# Patient Record
Sex: Female | Born: 1935
Health system: Southern US, Community
[De-identification: ages and names within clinical notes are randomized; demographics above are authoritative.]

## PROBLEM LIST (undated history)

## (undated) DIAGNOSIS — K8020 Calculus of gallbladder without cholecystitis without obstruction: Secondary | ICD-10-CM

## (undated) DIAGNOSIS — E785 Hyperlipidemia, unspecified: Secondary | ICD-10-CM

## (undated) DIAGNOSIS — R519 Headache, unspecified: Secondary | ICD-10-CM

## (undated) DIAGNOSIS — H3530 Unspecified macular degeneration: Secondary | ICD-10-CM

## (undated) DIAGNOSIS — F419 Anxiety disorder, unspecified: Secondary | ICD-10-CM

## (undated) DIAGNOSIS — C801 Malignant (primary) neoplasm, unspecified: Secondary | ICD-10-CM

## (undated) DIAGNOSIS — R079 Chest pain, unspecified: Secondary | ICD-10-CM

## (undated) DIAGNOSIS — R0600 Dyspnea, unspecified: Secondary | ICD-10-CM

## (undated) DIAGNOSIS — E119 Type 2 diabetes mellitus without complications: Secondary | ICD-10-CM

## (undated) DIAGNOSIS — R51 Headache: Secondary | ICD-10-CM

## (undated) DIAGNOSIS — Z8489 Family history of other specified conditions: Secondary | ICD-10-CM

## (undated) DIAGNOSIS — I272 Pulmonary hypertension, unspecified: Secondary | ICD-10-CM

## (undated) DIAGNOSIS — M1990 Unspecified osteoarthritis, unspecified site: Secondary | ICD-10-CM

## (undated) DIAGNOSIS — Z950 Presence of cardiac pacemaker: Secondary | ICD-10-CM

## (undated) DIAGNOSIS — I10 Essential (primary) hypertension: Secondary | ICD-10-CM

## (undated) DIAGNOSIS — Z972 Presence of dental prosthetic device (complete) (partial): Secondary | ICD-10-CM

## (undated) HISTORY — PX: PACEMAKER INSERTION: SHX728

## (undated) HISTORY — PX: KNEE SURGERY: SHX244

## (undated) HISTORY — DX: Hyperlipidemia, unspecified: E78.5

## (undated) HISTORY — PX: TOTAL KNEE ARTHROPLASTY: SHX125

## (undated) HISTORY — PX: CATARACT EXTRACTION: SUR2

## (undated) HISTORY — PX: MULTIPLE TOOTH EXTRACTIONS: SHX2053

## (undated) HISTORY — DX: Chest pain, unspecified: R07.9

## (undated) HISTORY — DX: Anxiety disorder, unspecified: F41.9

## (undated) HISTORY — DX: Type 2 diabetes mellitus without complications: E11.9

## (undated) HISTORY — DX: Unspecified osteoarthritis, unspecified site: M19.90

## (undated) HISTORY — DX: Presence of cardiac pacemaker: Z95.0

---

## 2008-09-13 HISTORY — PX: BREAST EXCISIONAL BIOPSY: SUR124

## 2011-09-22 DIAGNOSIS — R7309 Other abnormal glucose: Secondary | ICD-10-CM | POA: Diagnosis not present

## 2011-09-22 DIAGNOSIS — I10 Essential (primary) hypertension: Secondary | ICD-10-CM | POA: Diagnosis not present

## 2011-09-22 DIAGNOSIS — E785 Hyperlipidemia, unspecified: Secondary | ICD-10-CM | POA: Diagnosis not present

## 2011-10-14 DIAGNOSIS — C50919 Malignant neoplasm of unspecified site of unspecified female breast: Secondary | ICD-10-CM | POA: Diagnosis not present

## 2011-10-14 DIAGNOSIS — Z8041 Family history of malignant neoplasm of ovary: Secondary | ICD-10-CM | POA: Diagnosis not present

## 2011-10-14 DIAGNOSIS — E7800 Pure hypercholesterolemia, unspecified: Secondary | ICD-10-CM | POA: Diagnosis not present

## 2011-10-14 DIAGNOSIS — Z950 Presence of cardiac pacemaker: Secondary | ICD-10-CM | POA: Diagnosis not present

## 2011-10-28 DIAGNOSIS — C50919 Malignant neoplasm of unspecified site of unspecified female breast: Secondary | ICD-10-CM | POA: Diagnosis not present

## 2011-12-02 DIAGNOSIS — Z950 Presence of cardiac pacemaker: Secondary | ICD-10-CM | POA: Diagnosis not present

## 2011-12-02 DIAGNOSIS — I442 Atrioventricular block, complete: Secondary | ICD-10-CM | POA: Diagnosis not present

## 2011-12-16 DIAGNOSIS — R1032 Left lower quadrant pain: Secondary | ICD-10-CM | POA: Diagnosis not present

## 2011-12-16 DIAGNOSIS — K5900 Constipation, unspecified: Secondary | ICD-10-CM | POA: Diagnosis not present

## 2011-12-23 DIAGNOSIS — R1032 Left lower quadrant pain: Secondary | ICD-10-CM | POA: Diagnosis not present

## 2011-12-23 DIAGNOSIS — K5730 Diverticulosis of large intestine without perforation or abscess without bleeding: Secondary | ICD-10-CM | POA: Diagnosis not present

## 2011-12-23 DIAGNOSIS — R109 Unspecified abdominal pain: Secondary | ICD-10-CM | POA: Diagnosis not present

## 2011-12-27 DIAGNOSIS — R7309 Other abnormal glucose: Secondary | ICD-10-CM | POA: Diagnosis not present

## 2011-12-27 DIAGNOSIS — Z23 Encounter for immunization: Secondary | ICD-10-CM | POA: Diagnosis not present

## 2011-12-27 DIAGNOSIS — I10 Essential (primary) hypertension: Secondary | ICD-10-CM | POA: Diagnosis not present

## 2011-12-28 DIAGNOSIS — R928 Other abnormal and inconclusive findings on diagnostic imaging of breast: Secondary | ICD-10-CM | POA: Diagnosis not present

## 2011-12-28 DIAGNOSIS — Z853 Personal history of malignant neoplasm of breast: Secondary | ICD-10-CM | POA: Diagnosis not present

## 2012-01-03 DIAGNOSIS — R1032 Left lower quadrant pain: Secondary | ICD-10-CM | POA: Diagnosis not present

## 2012-02-29 DIAGNOSIS — IMO0002 Reserved for concepts with insufficient information to code with codable children: Secondary | ICD-10-CM | POA: Diagnosis not present

## 2012-03-14 DIAGNOSIS — I442 Atrioventricular block, complete: Secondary | ICD-10-CM | POA: Diagnosis not present

## 2012-03-14 DIAGNOSIS — Z950 Presence of cardiac pacemaker: Secondary | ICD-10-CM | POA: Diagnosis not present

## 2012-04-11 DIAGNOSIS — R7309 Other abnormal glucose: Secondary | ICD-10-CM | POA: Diagnosis not present

## 2012-04-11 DIAGNOSIS — I10 Essential (primary) hypertension: Secondary | ICD-10-CM | POA: Diagnosis not present

## 2012-04-11 DIAGNOSIS — L989 Disorder of the skin and subcutaneous tissue, unspecified: Secondary | ICD-10-CM | POA: Diagnosis not present

## 2012-05-02 DIAGNOSIS — Z09 Encounter for follow-up examination after completed treatment for conditions other than malignant neoplasm: Secondary | ICD-10-CM | POA: Diagnosis not present

## 2012-05-02 DIAGNOSIS — E7800 Pure hypercholesterolemia, unspecified: Secondary | ICD-10-CM | POA: Diagnosis not present

## 2012-05-02 DIAGNOSIS — Z9889 Other specified postprocedural states: Secondary | ICD-10-CM | POA: Diagnosis not present

## 2012-05-02 DIAGNOSIS — Z7982 Long term (current) use of aspirin: Secondary | ICD-10-CM | POA: Diagnosis not present

## 2012-05-02 DIAGNOSIS — Z950 Presence of cardiac pacemaker: Secondary | ICD-10-CM | POA: Diagnosis not present

## 2012-05-02 DIAGNOSIS — M549 Dorsalgia, unspecified: Secondary | ICD-10-CM | POA: Diagnosis not present

## 2012-05-02 DIAGNOSIS — Z853 Personal history of malignant neoplasm of breast: Secondary | ICD-10-CM | POA: Diagnosis not present

## 2012-05-02 DIAGNOSIS — G8929 Other chronic pain: Secondary | ICD-10-CM | POA: Diagnosis not present

## 2012-05-02 DIAGNOSIS — Z808 Family history of malignant neoplasm of other organs or systems: Secondary | ICD-10-CM | POA: Diagnosis not present

## 2012-05-23 DIAGNOSIS — R7309 Other abnormal glucose: Secondary | ICD-10-CM | POA: Diagnosis not present

## 2012-05-30 DIAGNOSIS — E119 Type 2 diabetes mellitus without complications: Secondary | ICD-10-CM | POA: Diagnosis not present

## 2012-06-29 DIAGNOSIS — Z9889 Other specified postprocedural states: Secondary | ICD-10-CM | POA: Diagnosis not present

## 2012-06-29 DIAGNOSIS — Z853 Personal history of malignant neoplasm of breast: Secondary | ICD-10-CM | POA: Diagnosis not present

## 2012-07-13 DIAGNOSIS — R7309 Other abnormal glucose: Secondary | ICD-10-CM | POA: Diagnosis not present

## 2012-07-13 DIAGNOSIS — Z23 Encounter for immunization: Secondary | ICD-10-CM | POA: Diagnosis not present

## 2012-07-13 DIAGNOSIS — I10 Essential (primary) hypertension: Secondary | ICD-10-CM | POA: Diagnosis not present

## 2012-07-13 DIAGNOSIS — E785 Hyperlipidemia, unspecified: Secondary | ICD-10-CM | POA: Diagnosis not present

## 2012-07-25 DIAGNOSIS — R7309 Other abnormal glucose: Secondary | ICD-10-CM | POA: Diagnosis not present

## 2012-08-03 DIAGNOSIS — I059 Rheumatic mitral valve disease, unspecified: Secondary | ICD-10-CM | POA: Diagnosis not present

## 2012-08-03 DIAGNOSIS — Z950 Presence of cardiac pacemaker: Secondary | ICD-10-CM | POA: Diagnosis not present

## 2012-08-03 DIAGNOSIS — I442 Atrioventricular block, complete: Secondary | ICD-10-CM | POA: Diagnosis not present

## 2012-08-03 DIAGNOSIS — I10 Essential (primary) hypertension: Secondary | ICD-10-CM | POA: Diagnosis not present

## 2012-10-19 DIAGNOSIS — E119 Type 2 diabetes mellitus without complications: Secondary | ICD-10-CM | POA: Diagnosis not present

## 2012-10-19 DIAGNOSIS — E785 Hyperlipidemia, unspecified: Secondary | ICD-10-CM | POA: Diagnosis not present

## 2012-10-19 DIAGNOSIS — M25559 Pain in unspecified hip: Secondary | ICD-10-CM | POA: Diagnosis not present

## 2012-10-19 DIAGNOSIS — I10 Essential (primary) hypertension: Secondary | ICD-10-CM | POA: Diagnosis not present

## 2012-10-24 DIAGNOSIS — M5137 Other intervertebral disc degeneration, lumbosacral region: Secondary | ICD-10-CM | POA: Diagnosis not present

## 2012-10-24 DIAGNOSIS — M48061 Spinal stenosis, lumbar region without neurogenic claudication: Secondary | ICD-10-CM | POA: Diagnosis not present

## 2012-11-07 DIAGNOSIS — Z950 Presence of cardiac pacemaker: Secondary | ICD-10-CM | POA: Diagnosis not present

## 2012-11-07 DIAGNOSIS — I442 Atrioventricular block, complete: Secondary | ICD-10-CM | POA: Diagnosis not present

## 2013-01-04 DIAGNOSIS — Z853 Personal history of malignant neoplasm of breast: Secondary | ICD-10-CM | POA: Diagnosis not present

## 2013-01-16 DIAGNOSIS — I10 Essential (primary) hypertension: Secondary | ICD-10-CM | POA: Diagnosis not present

## 2013-01-16 DIAGNOSIS — E785 Hyperlipidemia, unspecified: Secondary | ICD-10-CM | POA: Diagnosis not present

## 2013-01-16 DIAGNOSIS — E119 Type 2 diabetes mellitus without complications: Secondary | ICD-10-CM | POA: Diagnosis not present

## 2013-01-30 DIAGNOSIS — I059 Rheumatic mitral valve disease, unspecified: Secondary | ICD-10-CM | POA: Diagnosis not present

## 2013-01-30 DIAGNOSIS — E785 Hyperlipidemia, unspecified: Secondary | ICD-10-CM | POA: Diagnosis not present

## 2013-01-30 DIAGNOSIS — I10 Essential (primary) hypertension: Secondary | ICD-10-CM | POA: Diagnosis not present

## 2013-01-30 DIAGNOSIS — I079 Rheumatic tricuspid valve disease, unspecified: Secondary | ICD-10-CM | POA: Diagnosis not present

## 2013-01-30 DIAGNOSIS — I442 Atrioventricular block, complete: Secondary | ICD-10-CM | POA: Diagnosis not present

## 2013-05-19 ENCOUNTER — Emergency Department (HOSPITAL_COMMUNITY): Payer: Medicare Other

## 2013-05-19 ENCOUNTER — Encounter (HOSPITAL_COMMUNITY): Payer: Self-pay | Admitting: Emergency Medicine

## 2013-05-19 ENCOUNTER — Emergency Department (INDEPENDENT_AMBULATORY_CARE_PROVIDER_SITE_OTHER)
Admission: EM | Admit: 2013-05-19 | Discharge: 2013-05-19 | Disposition: A | Discharge status: Nursing Home (Includes ICF) | Payer: Medicare Other | Source: Home / Self Care | Attending: Family Medicine | Admitting: Family Medicine

## 2013-05-19 ENCOUNTER — Observation Stay (HOSPITAL_COMMUNITY)
Admission: EM | Admit: 2013-05-19 | Discharge: 2013-05-21 | Disposition: A | Discharge status: Home / Self Care | Payer: Medicare Other | Attending: Internal Medicine | Admitting: Internal Medicine

## 2013-05-19 DIAGNOSIS — R7309 Other abnormal glucose: Secondary | ICD-10-CM | POA: Diagnosis not present

## 2013-05-19 DIAGNOSIS — Z950 Presence of cardiac pacemaker: Secondary | ICD-10-CM | POA: Diagnosis not present

## 2013-05-19 DIAGNOSIS — R0609 Other forms of dyspnea: Secondary | ICD-10-CM | POA: Insufficient documentation

## 2013-05-19 DIAGNOSIS — R079 Chest pain, unspecified: Secondary | ICD-10-CM

## 2013-05-19 DIAGNOSIS — E1142 Type 2 diabetes mellitus with diabetic polyneuropathy: Secondary | ICD-10-CM | POA: Diagnosis present

## 2013-05-19 DIAGNOSIS — R0602 Shortness of breath: Secondary | ICD-10-CM | POA: Insufficient documentation

## 2013-05-19 DIAGNOSIS — R739 Hyperglycemia, unspecified: Secondary | ICD-10-CM

## 2013-05-19 DIAGNOSIS — R072 Precordial pain: Secondary | ICD-10-CM | POA: Diagnosis not present

## 2013-05-19 DIAGNOSIS — R42 Dizziness and giddiness: Secondary | ICD-10-CM | POA: Diagnosis not present

## 2013-05-19 DIAGNOSIS — I10 Essential (primary) hypertension: Secondary | ICD-10-CM | POA: Diagnosis not present

## 2013-05-19 DIAGNOSIS — E119 Type 2 diabetes mellitus without complications: Secondary | ICD-10-CM

## 2013-05-19 DIAGNOSIS — R0989 Other specified symptoms and signs involving the circulatory and respiratory systems: Secondary | ICD-10-CM | POA: Insufficient documentation

## 2013-05-19 HISTORY — DX: Essential (primary) hypertension: I10

## 2013-05-19 LAB — BASIC METABOLIC PANEL
BUN: 17 mg/dL (ref 6–23)
Chloride: 100 mEq/L (ref 96–112)
Creatinine, Ser: 0.61 mg/dL (ref 0.50–1.10)
Glucose, Bld: 161 mg/dL — ABNORMAL HIGH (ref 70–99)
Potassium: 3.7 mEq/L (ref 3.5–5.1)

## 2013-05-19 LAB — PROTIME-INR: INR: 1.05 (ref 0.00–1.49)

## 2013-05-19 LAB — CBC
HCT: 38.9 % (ref 36.0–46.0)
Hemoglobin: 13.2 g/dL (ref 12.0–15.0)
MCV: 84.2 fL (ref 78.0–100.0)
WBC: 5.7 10*3/uL (ref 4.0–10.5)

## 2013-05-19 LAB — D-DIMER, QUANTITATIVE: D-Dimer, Quant: 0.42 ug/mL-FEU (ref 0.00–0.48)

## 2013-05-19 LAB — POCT I-STAT TROPONIN I
Troponin i, poc: 0.02 ng/mL (ref 0.00–0.08)
Troponin i, poc: 0.02 ng/mL (ref 0.00–0.08)

## 2013-05-19 MED ORDER — ASPIRIN 81 MG PO CHEW: 324.0000 mg | CHEWABLE_TABLET | Freq: Once | ORAL | Status: DC

## 2013-05-19 MED ORDER — SODIUM CHLORIDE 0.9 % IV SOLN
Freq: Once | INTRAVENOUS | Status: AC
  Administered 2013-05-19: 16:00:00 via INTRAVENOUS

## 2013-05-19 NOTE — ED Provider Notes (Signed)
Maria [name] is a 79 y.o. female who presents to Urgent Care today for central chest pain shortness of breath dizziness left arm pain and left arm weakness. The pain is nonexertional . The chest pain and shortness of breath at the present for around 2 days and worsened over the last 24 hours. Left arm pain is been present for about one week. She patient thinks perhaps she is having a heart attack or stroke. She did take aspirin this morning prior to presentation to the urgent care. She notes that she has a an implantable pacer this previously been managed by cardiology in [place]. She recently moved to [place] to live with her daughter. She does not have a cardiologist here in [place]. She denies any nausea vomiting or diarrhea.    History reviewed. No pertinent past medical history. History  Substance Use Topics  . Smoking status: Never Smoker   . Smokeless tobacco: Not on file  . Alcohol Use: Not on file   ROS as above Medications reviewed. No current facility-administered medications for this encounter.   Current Outpatient Prescriptions  Medication Sig Dispense Refill  . moexipril-hydrochlorothiazide (UNIRETIC) 15-25 MG per tablet Take 1 tablet by mouth daily.        Exam:  BP 134/71  Pulse 87  Temp(Src) 98.1 F (36.7 C) (Oral)  Resp 16  SpO2 100% Gen: Well NAD HEENT: EOMI,  MMM Lungs: CTABL Nl WOB Heart: RRR no MRG Abd: NABS, NT, ND Exts: Non edematous BL  LE, warm and well perfused.   No results found for this or any previous visit (from the past 24 hour(s)). No results found.  Twelve-lead EKG shows atrial sensed and ventricular paced rhythm. Wide QRS complex due to ventricular pacing. No prior EKG to compare to.   Assessment and Plan: 79 y.o. female with nonexertional central chest pain in the setting of a pacemaker.  Patient additionally has dizziness and left arm pain. I'm concerned she may be having a myocardial infarction. Plan to transfer to the  emergency room via EMS for further evaluation and management. Patient and daughter expressed understanding and agreed.    Rodolph Bong, MD 05/19/13 1539

## 2013-05-19 NOTE — ED Notes (Signed)
Pt c/o sob, dizziness, headache x 2 days. Pt states she has left arm pain x 1 week. Pt took aspirin 6 hrs ago. Jan Ranson, SMA

## 2013-05-19 NOTE — ED Notes (Signed)
Patient transferred for [facility] via [facility] c/o of substernal chest pain and pain under left breast X2days.  Patient does have a pacemaker.  Patient also c/o SOB and denies n/v and diaphoresis. Patient took 324 of aspirin PTA and did not receive any nitroglycerin.  Patient is alert and orientedx4  NAD at this time.

## 2013-05-19 NOTE — ED Notes (Signed)
Patient states she hasn't eaten since this morning and is requesting something to eat.

## 2013-05-19 NOTE — ED Notes (Signed)
[facility] did not have truck avail... Called [facility] and report given to them

## 2013-05-19 NOTE — ED Provider Notes (Signed)
CSN: [id number]     Arrival date & time 05/19/13  1634 History   First MD Initiated Contact with Patient 05/19/13 1704     Chief Complaint  Patient presents with  . Chest Pain   (Consider location/radiation/quality/duration/timing/severity/associated sxs/prior Treatment) Patient is a 79 y.o. female presenting with chest pain. The history is provided by the patient.  Chest Pain She has been having dizziness and chest pain for the last 2 days, but actually is feeling better today. Dizziness is described as a lightheaded feeling which is worse when she bends over. Chest pain is sharp and in the mid sternal area without radiation. It seems to be worse when she is laying down but nothing makes it better. Pain was 7/10 at its worst but she states she is pain-free and states she's been pain-free since this morning. There has been some mild dyspnea but no nausea or diaphoresis. She denies any cough. Denies fever or chills. She went to urgent care where she was referred to the emergency department. She is cared for by a cardiologist in [place] because of a pacemaker which he denies having any known coronary artery disease.  Past Medical History  Diagnosis Date  . Hypertension    History reviewed. No pertinent past surgical history. No family history on file. History  Substance Use Topics  . Smoking status: Never Smoker   . Smokeless tobacco: Not on file  . Alcohol Use: Not on file   OB History   Grav Para Term Preterm Abortions TAB SAB Ect Mult Living                 Review of Systems  Cardiovascular: Positive for chest pain.  All other systems reviewed and are negative.    Allergies  Review of patient's allergies indicates no known allergies.  Home Medications   Current Outpatient Rx  Name  Route  Sig  Dispense  Refill  . moexipril-hydrochlorothiazide (UNIRETIC) 15-25 MG per tablet   Oral   Take 1 tablet by mouth daily.          BP 126/56  Pulse 61  Temp(Src) 97.9 F  (36.6 C) (Oral)  Resp 20  SpO2 100% Physical Exam  Nursing note and vitals reviewed.  79 year old female, resting comfortably and in no acute distress. Vital signs are normal. Oxygen saturation is 100%, which is normal. Head is normocephalic and atraumatic. PERRLA, EOMI. Oropharynx is clear. Neck is nontender and supple without adenopathy or JVD. Back is nontender and there is no CVA tenderness. Lungs are clear without rales, wheezes, or rhonchi. Chest is nontender. Heart has regular rate and rhythm without murmur. Abdomen is soft, flat, nontender without masses or hepatosplenomegaly and peristalsis is normoactive. Extremities have no cyanosis or edema, full range of motion is present. Skin is warm and dry without rash. Neurologic: Mental status is normal, cranial nerves are intact, there are no motor or sensory deficits.  ED Course  Procedures (including critical care time) Labs Review Results for orders placed during the hospital encounter of 05/19/13  CBC      Result Value Range   WBC 5.7  4.0 - 10.5 K/uL   RBC 4.62  3.87 - 5.11 MIL/uL   Hemoglobin 13.2  12.0 - 15.0 g/dL   HCT 86.5  78.4 - 69.6 %   MCV 84.2  78.0 - 100.0 fL   MCH 28.6  26.0 - 34.0 pg   MCHC 33.9  30.0 - 36.0 g/dL   RDW 13.4  11.5 - 15.5 %   Platelets 228  150 - 400 K/uL  BASIC METABOLIC PANEL      Result Value Range   Sodium 138  135 - 145 mEq/L   Potassium 3.7  3.5 - 5.1 mEq/L   Chloride 100  96 - 112 mEq/L   CO2 26  19 - 32 mEq/L   Glucose, Bld 161 (*) 70 - 99 mg/dL   BUN 17  6 - 23 mg/dL   Creatinine, Ser 1.61  0.50 - 1.10 mg/dL   Calcium 9.5  8.4 - 09.6 mg/dL   GFR calc non Af Amer 80 (*) >90 mL/min   GFR calc Af Amer >90  >90 mL/min  PRO B NATRIURETIC PEPTIDE      Result Value Range   Pro B Natriuretic peptide (BNP) 284.9  0 - 450 pg/mL  PROTIME-INR      Result Value Range   Prothrombin Time 13.5  11.6 - 15.2 seconds   INR 1.05  0.00 - 1.49  D-DIMER, QUANTITATIVE      Result Value Range    D-Dimer, Quant 0.42  0.00 - 0.48 ug/mL-FEU  POCT I-STAT TROPONIN I      Result Value Range   Troponin i, poc 0.02  0.00 - 0.08 ng/mL   Comment 3            Imaging Review Dg Chest 2 View  05/19/2013   *RADIOLOGY REPORT*  Clinical Data: Chest pain and shortness of breath.  CHEST - 2 VIEW  Comparison: No priors.  Findings: Lung volumes are normal.  No consolidative airspace disease.  No pleural effusions.  No pneumothorax.  No pulmonary nodule or mass noted.  Pulmonary vasculature and the cardiomediastinal silhouette are within normal limits. Atherosclerosis in the thoracic aorta.  Left-sided pacemaker device in place with lead tips projecting over the expected location of the right atrium and right ventricular apex.  Radiopaque material projecting over the lower right cervical region is of uncertain etiology and significance.  IMPRESSION: 1. No radiographic evidence of acute cardiopulmonary disease. 2.  Atherosclerosis.   Original Report Authenticated By: Trudie Reed, M.D.    Date: 05/19/2013  Rate: 61  Rhythm: AV sequential pacemaker  QRS Axis: left  Intervals: normal  ST/T Wave abnormalities: normal  Conduction Disutrbances:none  Narrative Interpretation: AV sequential pacemaker. No prior ECG available for comparison.  Old EKG Reviewed: none available   MDM   1. Chest pain   2. Hyperglycemia    Chest pain and dizziness which does seem to be resolving. Patient states that she [place] she says she will need referral for cardiology and for general medical physician. Her initial troponin is negative. Since she states she has been pain-free since this morning, this should be adequate to rule out acute cardiac event.  Laboratory workup has come back normal. Patient's history is now changing once again and she stating she is periodically having pains that come that last for about a minute. Because of uncertainty of her history, I feel that she needs to be  admitted for serial cardiac markers and consideration for cardiology consultation. Case is discussed with Dr. Julian Reil of [facility] who agrees to admit the patient. Incidentally noted on patient's laboratory workup his blood sugar of 161. This will need to be followed as an outpatient to make sure she is not developing diabetes.  Dione Booze, MD 05/19/13 2337

## 2013-05-19 NOTE — ED Notes (Signed)
Evaluated pt at request of registration staff.  Pt c/o dizzy+HA x4 days, worse in the last 24 hrs. Vitals obtained and pt asked to remain in waiting area until summoned to exam room.

## 2013-05-20 ENCOUNTER — Encounter (HOSPITAL_COMMUNITY): Payer: Self-pay | Admitting: *Deleted

## 2013-05-20 DIAGNOSIS — R079 Chest pain, unspecified: Secondary | ICD-10-CM | POA: Diagnosis not present

## 2013-05-20 DIAGNOSIS — R7309 Other abnormal glucose: Secondary | ICD-10-CM

## 2013-05-20 DIAGNOSIS — I10 Essential (primary) hypertension: Secondary | ICD-10-CM | POA: Diagnosis not present

## 2013-05-20 LAB — LIPID PANEL
HDL: 38 mg/dL — ABNORMAL LOW (ref >39)
LDL Cholesterol: 69 mg/dL (ref 0–99)
Triglycerides: 156 mg/dL — ABNORMAL HIGH (ref <150)

## 2013-05-20 MED ORDER — TRANDOLAPRIL 2 MG PO TABS
2.0000 mg | ORAL_TABLET | Freq: Every day | ORAL | Status: DC
  Administered 2013-05-20 – 2013-05-21 (×2): 2 mg via ORAL
  Filled 2013-05-20 (×2): qty 1

## 2013-05-20 MED ORDER — TRAMADOL HCL 50 MG PO TABS: 50.0000 mg | ORAL_TABLET | Freq: Four times a day (QID) | ORAL | Status: DC | PRN

## 2013-05-20 MED ORDER — SIMVASTATIN 40 MG PO TABS
40.0000 mg | ORAL_TABLET | Freq: Every evening | ORAL | Status: DC
  Administered 2013-05-20: 40 mg via ORAL
  Filled 2013-05-20 (×2): qty 1

## 2013-05-20 MED ORDER — HYDROCHLOROTHIAZIDE 25 MG PO TABS
25.0000 mg | ORAL_TABLET | Freq: Every day | ORAL | Status: DC
  Administered 2013-05-20 – 2013-05-21 (×2): 25 mg via ORAL
  Filled 2013-05-20 (×2): qty 1

## 2013-05-20 MED ORDER — POTASSIUM CHLORIDE CRYS ER 20 MEQ PO TBCR
20.0000 meq | EXTENDED_RELEASE_TABLET | Freq: Every day | ORAL | Status: DC
  Administered 2013-05-20 – 2013-05-21 (×2): 20 meq via ORAL
  Filled 2013-05-20 (×2): qty 1

## 2013-05-20 MED ORDER — ONDANSETRON HCL 4 MG/2ML IJ SOLN: 4.0000 mg | Freq: Four times a day (QID) | INTRAMUSCULAR | Status: DC | PRN

## 2013-05-20 MED ORDER — MECLIZINE HCL 12.5 MG PO TABS
12.5000 mg | ORAL_TABLET | Freq: Three times a day (TID) | ORAL | Status: DC | PRN
Start: 2013-05-20 — End: 2013-05-21
  Filled 2013-05-20: qty 1

## 2013-05-20 MED ORDER — ACETAMINOPHEN 325 MG PO TABS: 650.0000 mg | ORAL_TABLET | ORAL | Status: DC | PRN

## 2013-05-20 MED ORDER — MOEXIPRIL-HYDROCHLOROTHIAZIDE 15-25 MG PO TABS: 1.0000 | ORAL_TABLET | Freq: Every day | ORAL | Status: DC

## 2013-05-20 MED ORDER — ASPIRIN EC 81 MG PO TBEC
81.0000 mg | DELAYED_RELEASE_TABLET | Freq: Every day | ORAL | Status: DC
  Administered 2013-05-20 – 2013-05-21 (×2): 81 mg via ORAL
  Filled 2013-05-20 (×2): qty 1

## 2013-05-20 MED ORDER — HEPARIN SODIUM (PORCINE) 5000 UNIT/ML IJ SOLN
5000.0000 [IU] | Freq: Three times a day (TID) | INTRAMUSCULAR | Status: DC
  Administered 2013-05-20 – 2013-05-21 (×3): 5000 [IU] via SUBCUTANEOUS
  Filled 2013-05-20 (×7): qty 1

## 2013-05-20 NOTE — Progress Notes (Signed)
*  PRELIMINARY RESULTS* Echocardiogram 2D Echocardiogram has been performed.  Jeryl Columbia 05/20/2013, 3:29 PM

## 2013-05-20 NOTE — H&P (Signed)
[facility] History and Physical  Maria [name]:[id number] DOB: Nov 08, 1923 DOA: 05/19/2013  Referring physician: ED PCP: No primary provider on file.    Chief Complaint: Chest pain  HPI: Maria [name] is a 79 y.o. female who presents with 2 day history of chest pain.  Chest pain has been improving today and she is actually pain free at this time and has been since this morning.  There has been some mild dyspnea but no nausea nor diaphoresis.  She denies any cough.  She went to [facility] where she was referred to the ED.  She has seen a cardiologist in [place] in the past as she has a pacemaker but has no known history of CAD.  Last stress test was several years ago.  In the ED troponin's were negative.  Review of Systems: 12 systems reviewed and otherwise negative.  Past Medical History  Diagnosis Date  . Hypertension    History reviewed. No pertinent past surgical history. Social History:  reports that she has never smoked. Her smokeless tobacco use includes Snuff. She reports that she drinks about 0.6 ounces of alcohol per week. Her drug history is not on file.   No Known Allergies  History reviewed. No pertinent family history.  Prior to Admission medications   Medication Sig Start Date End Date Taking? Authorizing Provider  aspirin EC 81 MG tablet Take 81 mg by mouth daily.   Yes Historical Provider, MD  meclizine (ANTIVERT) 25 MG tablet Take 12.5 mg by mouth 3 (three) times daily as needed for dizziness.   Yes Historical Provider, MD  moexipril-hydrochlorothiazide (UNIRETIC) 15-25 MG per tablet Take 1 tablet by mouth daily.   Yes Historical Provider, MD  OVER THE COUNTER MEDICATION Place 2 drops into both eyes daily as needed (for dry eyes).   Yes Historical Provider, MD  potassium chloride SA (K-DUR,KLOR-CON) 20 MEQ tablet Take 20 mEq by mouth daily.   Yes Historical Provider, MD  simvastatin (ZOCOR) 40 MG tablet Take 40 mg by mouth every evening.   Yes Historical  Provider, MD  traMADol (ULTRAM) 50 MG tablet Take 50-100 mg by mouth every 6 (six) hours as needed for pain.   Yes Historical Provider, MD   Physical Exam: Filed Vitals:   05/20/13 0059  BP: 129/64  Pulse: 66  Temp: 98.4 F (36.9 C)  Resp: 18    General:  NAD, resting comfortably in bed Eyes: PEERLA EOMI ENT: mucous membranes moist Neck: supple w/o JVD Cardiovascular: RRR w/o MRG Respiratory: CTA B Abdomen: soft, nt, nd, bs+ Skin: no rash nor lesion Musculoskeletal: MAE, full ROM all 4 extremities Psychiatric: normal tone and affect Neurologic: AAOx3, grossly non-focal  Labs on Admission:  Basic Metabolic Panel:  Recent Labs Lab 05/19/13 1658  NA 138  K 3.7  CL 100  CO2 26  GLUCOSE 161*  BUN 17  CREATININE 0.61  CALCIUM 9.5   Liver Function Tests: No results found for this basename: AST, ALT, ALKPHOS, BILITOT, PROT, ALBUMIN,  in the last 168 hours No results found for this basename: LIPASE, AMYLASE,  in the last 168 hours No results found for this basename: AMMONIA,  in the last 168 hours CBC:  Recent Labs Lab 05/19/13 1658  WBC 5.7  HGB 13.2  HCT 38.9  MCV 84.2  PLT 228   Cardiac Enzymes: No results found for this basename: CKTOTAL, CKMB, CKMBINDEX, TROPONINI,  in the last 168 hours  BNP (last 3 results)  Recent Labs  05/19/13  1655  PROBNP 284.9   CBG: No results found for this basename: GLUCAP,  in the last 168 hours  Radiological Exams on Admission: Dg Chest 2 View  05/19/2013   *RADIOLOGY REPORT*  Clinical Data: Chest pain and shortness of breath.  CHEST - 2 VIEW  Comparison: No priors.  Findings: Lung volumes are normal.  No consolidative airspace disease.  No pleural effusions.  No pneumothorax.  No pulmonary nodule or mass noted.  Pulmonary vasculature and the cardiomediastinal silhouette are within normal limits. Atherosclerosis in the thoracic aorta.  Left-sided pacemaker device in place with lead tips projecting over the expected location  of the right atrium and right ventricular apex.  Radiopaque material projecting over the lower right cervical region is of uncertain etiology and significance.  IMPRESSION: 1. No radiographic evidence of acute cardiopulmonary disease. 2.  Atherosclerosis.   Original Report Authenticated By: Trudie Reed, M.D.    EKG: Independently reviewed.  Assessment/Plan Principal Problem:   Chest pain Active Problems:   HTN (hypertension)   1. Chest pain - pain free at present time, EDP requests admission for rule out, admitting, serial trops ordered, cards consult in AM.  Tele monitor, may need stress test.  Given hyperglycemia in ED, ordering A1C and lipid profile to further risk stratify patient.  Previous records not available at this time (should all be at [facility] she and family state). 2. HTN - continue home meds.    Code Status: Full Code (must indicate code status--if unknown or must be presumed, indicate so) Family Communication: Spoke with family at bedside (indicate person spoken with, if applicable, with phone number if by telephone) Disposition Plan: Admit to obs (indicate anticipated LOS)  Time spent: 50 min  GARDNER, JARED M. [facility] Pager [phone]  If 7PM-7AM, please contact night-coverage [email or web address] Password [email or web address] 05/20/2013, 1:37 AM

## 2013-05-20 NOTE — Progress Notes (Signed)
Patient seen earlier by my colleague Dr. Lanae Boast. Patient seen and examined, atypical chest pain with reproducible component left-sided. Rule out ACS, cycle 3 sets of cardiac enzymes. Patient will be on telemetry. EKG showed atrial sensed paced rhythm. Obtain 2-D echocardiogram to rule out wall motion abnormalities.  Clint Lipps Pager: [phone] 05/20/2013, 11:11 AM

## 2013-05-20 NOTE — Progress Notes (Signed)
Utilization Review completed.  

## 2013-05-20 NOTE — Discharge Summary (Signed)
Physician Discharge Summary  Maria [name] YNW:[id number] DOB: 08-10-1924 DOA: 05/19/2013  PCP: No primary provider on file.  Admit date: 05/19/2013 Discharge date: 05/21/2013  Time spent: 40 minutes  Recommendations for Outpatient Follow-up:  1. Followup with primary care physician within one week.  Discharge Diagnoses:  Principal Problem:   Chest pain Active Problems:   HTN (hypertension)   DM type 2 (diabetes mellitus, type 2)   Discharge Condition: Stable  Diet recommendation: Heart healthy diet.  Filed Weights   05/20/13 0059  Weight: 65.635 kg (144 lb 11.2 oz)    History of present illness:  Maria [name] is a 79 y.o. female who presents with 2 day history of chest pain. Chest pain has been improving today and she is actually pain free at this time and has been since this morning. There has been some mild dyspnea but no nausea nor diaphoresis. She denies any cough. She went to [facility] where she was referred to the ED. She has seen a cardiologist in [place] in the past as she has a pacemaker but has no known history of CAD. Last stress test was several years ago.  In the ED troponin's were negative.  Hospital Course:   1. Chest pain: Patient has very atypical chest pain left-sided, and reproducible to palpation and the nitroglycerin or rest did not make a big difference. Patient was placed on telemetry monitoring, the ACS was ruled out by -3 sets of cardiac enzymes. EKG is paced rhythm, cannot do any further interpreting. 2-D echocardiogram showed ejection fraction of 60-65% without wall motion abnormalities. Patient is chest pain is reproducible I think it is atypical musculoskeletal chest pain. Patient can be discharged to followup with primary care physician, stress test as outpatient.   2. Diabetes mellitus type 2: Patient mentioned that she was diagnosed with borderline diabetes and she's been checking her blood sugar, her hemoglobin A1c is 7.2, according to ADA guidelines  this is should be treated with diet changes and/or medications. Patient placed on metformin 500 mg by mouth twice a day, instructed to take her blood sugar at least once a daily in the morning and report it to her primary care physician.  3. Hypertension: Preadmission oral medications continued.  4. Dyslipidemia: Seems pretty controlled, her total cholesterol is 138, LDL of 69. Patient is on simvastatin that was continued throughout the hospital stay.  Procedures:  2D Echo: Study Conclusions  - Left ventricle: The cavity size was normal. There was mild concentric hypertrophy. Systolic function was normal. The estimated ejection fraction was in the range of 60% to 65%. Although no diagnostic regional wall motion abnormality was identified, this possibility cannot be completely excluded on the basis of this study. Doppler parameters are consistent with abnormal left ventricular relaxation (grade 1 diastolic dysfunction). - Mitral valve: Calcified annulus. Mild regurgitation.   Consultations:  None  Discharge Exam: Filed Vitals:   05/21/13 0500  BP: 121/78  Pulse: 67  Temp: 98.2 F (36.8 C)  Resp: 18   General: Alert and awake, oriented x3, not in any acute distress. HEENT: anicteric sclera, pupils reactive to light and accommodation, EOMI CVS: S1-S2 clear, no murmur rubs or gallops Chest: clear to auscultation bilaterally, no wheezing, rales or rhonchi Abdomen: soft nontender, nondistended, normal bowel sounds, no organomegaly Extremities: no cyanosis, clubbing or edema noted bilaterally Neuro: Cranial nerves II-XII intact, no focal neurological deficits  Discharge Instructions      Discharge Orders   Future Orders Complete By Expires  Diet Carb Modified  As directed    Increase activity slowly  As directed        Medication List         aspirin EC 81 MG tablet  Take 81 mg by mouth daily.     meclizine 25 MG tablet  Commonly known as:  ANTIVERT  Take 12.5  mg by mouth 3 (three) times daily as needed for dizziness.     metFORMIN 500 MG tablet  Commonly known as:  GLUCOPHAGE  Take 1 tablet (500 mg total) by mouth 2 (two) times daily with a meal.     moexipril-hydrochlorothiazide 15-25 MG per tablet  Commonly known as:  UNIRETIC  Take 1 tablet by mouth daily.     OVER THE COUNTER MEDICATION  Place 2 drops into both eyes daily as needed (for dry eyes).     potassium chloride SA 20 MEQ tablet  Commonly known as:  K-DUR,KLOR-CON  Take 20 mEq by mouth daily.     simvastatin 40 MG tablet  Commonly known as:  ZOCOR  Take 40 mg by mouth every evening.     traMADol 50 MG tablet  Commonly known as:  ULTRAM  Take 50-100 mg by mouth every 6 (six) hours as needed for pain.       No Known Allergies    The results of significant diagnostics from this hospitalization (including imaging, microbiology, ancillary and laboratory) are listed below for reference.    Significant Diagnostic Studies: Dg Chest 2 View  05/19/2013   *RADIOLOGY REPORT*  Clinical Data: Chest pain and shortness of breath.  CHEST - 2 VIEW  Comparison: No priors.  Findings: Lung volumes are normal.  No consolidative airspace disease.  No pleural effusions.  No pneumothorax.  No pulmonary nodule or mass noted.  Pulmonary vasculature and the cardiomediastinal silhouette are within normal limits. Atherosclerosis in the thoracic aorta.  Left-sided pacemaker device in place with lead tips projecting over the expected location of the right atrium and right ventricular apex.  Radiopaque material projecting over the lower right cervical region is of uncertain etiology and significance.  IMPRESSION: 1. No radiographic evidence of acute cardiopulmonary disease. 2.  Atherosclerosis.   Original Report Authenticated By: Trudie Reed, M.D.    Microbiology: No results found for this or any previous visit (from the past 240 hour(s)).   Labs: Basic Metabolic Panel:  Recent Labs Lab  05/19/13 1658  NA 138  K 3.7  CL 100  CO2 26  GLUCOSE 161*  BUN 17  CREATININE 0.61  CALCIUM 9.5   Liver Function Tests: No results found for this basename: AST, ALT, ALKPHOS, BILITOT, PROT, ALBUMIN,  in the last 168 hours No results found for this basename: LIPASE, AMYLASE,  in the last 168 hours No results found for this basename: AMMONIA,  in the last 168 hours CBC:  Recent Labs Lab 05/19/13 1658  WBC 5.7  HGB 13.2  HCT 38.9  MCV 84.2  PLT 228   Cardiac Enzymes:  Recent Labs Lab 05/20/13 1240  TROPONINI <0.30   BNP: BNP (last 3 results)  Recent Labs  05/19/13 1655  PROBNP 284.9   CBG: No results found for this basename: GLUCAP,  in the last 168 hours     Signed:  Dilan Novosad A  [facility] 05/21/2013, 9:48 AM

## 2013-05-21 DIAGNOSIS — E119 Type 2 diabetes mellitus without complications: Secondary | ICD-10-CM | POA: Diagnosis not present

## 2013-05-21 DIAGNOSIS — I10 Essential (primary) hypertension: Secondary | ICD-10-CM | POA: Diagnosis not present

## 2013-05-21 DIAGNOSIS — E1142 Type 2 diabetes mellitus with diabetic polyneuropathy: Secondary | ICD-10-CM | POA: Diagnosis present

## 2013-05-21 DIAGNOSIS — R079 Chest pain, unspecified: Secondary | ICD-10-CM | POA: Diagnosis not present

## 2013-05-21 LAB — HEMOGLOBIN A1C: Hgb A1c MFr Bld: 7.2 % — ABNORMAL HIGH (ref <5.7)

## 2013-05-21 MED ORDER — METFORMIN HCL 500 MG PO TABS: 500.0000 mg | ORAL_TABLET | Freq: Two times a day (BID) | ORAL | Status: DC

## 2013-05-21 NOTE — Care Management (Signed)
Case Manager did provide pt with the [facility] Number in order for pt to call and find PCP in the [place] area. No further needs for the CM at this time. Gala Lewandowsky, RN,BSN [phone]

## 2013-05-30 ENCOUNTER — Encounter: Payer: Self-pay | Admitting: Cardiology

## 2013-05-30 ENCOUNTER — Ambulatory Visit: Payer: Medicare Other | Attending: Cardiology | Admitting: Cardiology

## 2013-05-30 VITALS — BP 158/84 | HR 89 | Temp 98.2°F | Resp 16 | Ht 62.0 in | Wt 144.0 lb

## 2013-05-30 DIAGNOSIS — R079 Chest pain, unspecified: Secondary | ICD-10-CM | POA: Diagnosis not present

## 2013-05-30 DIAGNOSIS — E119 Type 2 diabetes mellitus without complications: Secondary | ICD-10-CM

## 2013-05-30 DIAGNOSIS — Z79899 Other long term (current) drug therapy: Secondary | ICD-10-CM | POA: Insufficient documentation

## 2013-05-30 DIAGNOSIS — R109 Unspecified abdominal pain: Secondary | ICD-10-CM | POA: Insufficient documentation

## 2013-05-30 DIAGNOSIS — I10 Essential (primary) hypertension: Secondary | ICD-10-CM

## 2013-05-30 DIAGNOSIS — Z950 Presence of cardiac pacemaker: Secondary | ICD-10-CM | POA: Insufficient documentation

## 2013-05-30 DIAGNOSIS — Z7982 Long term (current) use of aspirin: Secondary | ICD-10-CM | POA: Diagnosis not present

## 2013-05-30 DIAGNOSIS — R0789 Other chest pain: Secondary | ICD-10-CM | POA: Diagnosis not present

## 2013-05-30 NOTE — Progress Notes (Signed)
HPI [name] returns today after being discharged from the hospital with chest discomfort. It was reproducible with palpation, cardiac markers were negative and echocardiogram was normal. It was felt to be musculo skeletal.  Since discharge the discomfort has continued to bother her but not as bad. She just moved from [place] has been doing more activity than usual. It is clearly not anginal by history.  She was also started on metformin. Her hemoglobin A1c was 7.2%. She has lost her appetite and is having some GI cramps.  She has a Medtronic pacer that was placed in [place] 11/21/2001. She was  ventricular paced in the hospital. She is now permanently here and needs to establish with [facility] device clinic.  Past Medical History  Diagnosis Date  . Hypertension     Current Outpatient Prescriptions  Medication Sig Dispense Refill  . aspirin EC 81 MG tablet Take 81 mg by mouth daily.      . metFORMIN (GLUCOPHAGE) 500 MG tablet Take 1 tablet (500 mg total) by mouth 2 (two) times daily with a meal.  60 tablet  0  . moexipril-hydrochlorothiazide (UNIRETIC) 15-25 MG per tablet Take 1 tablet by mouth daily.      . potassium chloride SA (K-DUR,KLOR-CON) 20 MEQ tablet Take 20 mEq by mouth daily.      . simvastatin (ZOCOR) 40 MG tablet Take 40 mg by mouth every evening.      . traMADol (ULTRAM) 50 MG tablet Take 50-100 mg by mouth every 6 (six) hours as needed for pain.      . meclizine (ANTIVERT) 25 MG tablet Take 12.5 mg by mouth 3 (three) times daily as needed for dizziness.      Marland Kitchen OVER THE COUNTER MEDICATION Place 2 drops into both eyes daily as needed (for dry eyes).       No current facility-administered medications for this visit.    No Known Allergies  No family history on file.  History   Social History  . Marital Status: Widowed    Spouse Name: N/A    Number of Children: N/A  . Years of Education: N/A   Occupational History  . Not on file.   Social History Main Topics  .  Smoking status: Never Smoker   . Smokeless tobacco: Current User    Types: Snuff  . Alcohol Use: 0.6 oz/week    1 Cans of beer per week  . Drug Use: Not on file  . Sexual Activity: Not on file   Other Topics Concern  . Not on file   Social History Narrative  . No narrative on file    ROS ALL NEGATIVE EXCEPT THOSE NOTED IN HPI  PE  General Appearance: well developed, well nourished in no acute distress, looks younger than stated age HEENT: symmetrical face, PERRLA, good dentition  Neck: no JVD, thyromegaly, or adenopathy, trachea midline Chest: symmetric without deformity Cardiac: PMI non-displaced, RRR, normal S1, S2, no gallop or murmur Lung: clear to ausculation and percussion Vascular: all pulses full without bruits  Abdominal: nondistended, nontender, good bowel sounds, no HSM, no bruits Extremities: no cyanosis, clubbing or edema, no sign of DVT, no varicosities  Skin: normal color, no rashes Neuro: alert and oriented x 3, non-focal Pysch: normal affect  EKG Review of EKG from September 8 in hospital shows atrial sensing with ventricular pacing. BMET    Component Value Date/Time   NA 138 05/19/2013 1658   K 3.7 05/19/2013 1658   CL 100 05/19/2013 1658  CO2 26 05/19/2013 1658   GLUCOSE 161* 05/19/2013 1658   BUN 17 05/19/2013 1658   CREATININE 0.61 05/19/2013 1658   CALCIUM 9.5 05/19/2013 1658   GFRNONAA 80* 05/19/2013 1658   GFRAA >90 05/19/2013 1658    Lipid Panel     Component Value Date/Time   CHOL 138 05/20/2013 0400   TRIG 156* 05/20/2013 0400   HDL 38* 05/20/2013 0400   CHOLHDL 3.6 05/20/2013 0400   VLDL 31 05/20/2013 0400   LDLCALC 69 05/20/2013 0400    CBC    Component Value Date/Time   WBC 5.7 05/19/2013 1658   RBC 4.62 05/19/2013 1658   HGB 13.2 05/19/2013 1658   HCT 38.9 05/19/2013 1658   PLT 228 05/19/2013 1658   MCV 84.2 05/19/2013 1658   MCH 28.6 05/19/2013 1658   MCHC 33.9 05/19/2013 1658   RDW 13.4 05/19/2013 1658

## 2013-05-30 NOTE — Assessment & Plan Note (Signed)
We'll make arrangements for her to be seen at the [facility].

## 2013-05-30 NOTE — Patient Instructions (Signed)
Patient will be followed in device clinic. I've made arrangements for them to call her. She does not need a cardiologist in this clinic. We will set her up for primary care followup here in 3 months.

## 2013-05-30 NOTE — Assessment & Plan Note (Signed)
Hemoglobin A1c was 7.2%. She is having side effects from metformin. We will discontinue this.

## 2013-05-30 NOTE — Assessment & Plan Note (Signed)
Under pretty good control. Will not make any changes today.

## 2013-05-30 NOTE — Assessment & Plan Note (Signed)
Unchanged but improved.: Musculoskeletal. No further cardiac evaluation. Patient and daughter reassured. Continue secondary preventative therapy for subclinical coronary disease. Do not think stress test is indicated.

## 2013-05-31 NOTE — Progress Notes (Signed)
Pt here to establish care with pcp and  [facility] CHEST PAIN PT REFERRED TO [facility]  FOR PACEMAKER CLINIC

## 2013-06-04 ENCOUNTER — Encounter: Payer: Self-pay | Admitting: *Deleted

## 2013-06-05 ENCOUNTER — Ambulatory Visit (INDEPENDENT_AMBULATORY_CARE_PROVIDER_SITE_OTHER): Payer: Medicare Other | Admitting: Internal Medicine

## 2013-06-05 ENCOUNTER — Encounter: Payer: Self-pay | Admitting: Internal Medicine

## 2013-06-05 VITALS — BP 118/71 | HR 96 | Ht 62.0 in | Wt 145.0 lb

## 2013-06-05 DIAGNOSIS — I10 Essential (primary) hypertension: Secondary | ICD-10-CM | POA: Diagnosis not present

## 2013-06-05 DIAGNOSIS — I442 Atrioventricular block, complete: Secondary | ICD-10-CM | POA: Diagnosis not present

## 2013-06-05 DIAGNOSIS — R079 Chest pain, unspecified: Secondary | ICD-10-CM | POA: Diagnosis not present

## 2013-06-05 DIAGNOSIS — Z950 Presence of cardiac pacemaker: Secondary | ICD-10-CM | POA: Diagnosis not present

## 2013-06-05 DIAGNOSIS — E785 Hyperlipidemia, unspecified: Secondary | ICD-10-CM | POA: Insufficient documentation

## 2013-06-05 LAB — PACEMAKER DEVICE OBSERVATION
AL AMPLITUDE: 5.6 mv
AL THRESHOLD: 0.5 V
BAMS-0001: 175 {beats}/min
RV LEAD IMPEDENCE PM: 726 Ohm

## 2013-06-05 MED ORDER — SIMVASTATIN 40 MG PO TABS: 20.0000 mg | ORAL_TABLET | Freq: Every evening | ORAL | Status: DC

## 2013-06-05 NOTE — Assessment & Plan Note (Signed)
Her Medtronic dual-chamber pacemaker is working normally. We'll plan to recheck in several months. 

## 2013-06-05 NOTE — Progress Notes (Signed)
HPI Mrs. Carnathan is referred today by Dr. Daleen Squibb. She is a very pleasant 79 year old woman who looks much younger than her stated age, with a history of hypertension and dyslipidemia, status post permanent pacemaker insertion. The patient has recently moved to [place] to live closer to her daughter. She has not had syncope. She does have occasional pain in her chest area which is reproducible with palpation. She denies shortness of breath. She has occasional dizzy spells. No peripheral edema has been complained of. She does note occasional cramps in her lower extremities. No Known Allergies   Current Outpatient Prescriptions  Medication Sig Dispense Refill  . aspirin EC 81 MG tablet Take 81 mg by mouth daily.      . meclizine (ANTIVERT) 25 MG tablet Take 12.5 mg by mouth 3 (three) times daily as needed for dizziness.      . moexipril-hydrochlorothiazide (UNIRETIC) 15-25 MG per tablet Take 1 tablet by mouth daily.      Marland Kitchen OVER THE COUNTER MEDICATION Place 2 drops into both eyes daily as needed (for dry eyes).      . potassium chloride SA (K-DUR,KLOR-CON) 20 MEQ tablet Take 20 mEq by mouth daily.      . simvastatin (ZOCOR) 40 MG tablet Take 0.5 tablets (20 mg total) by mouth every evening.  30 tablet    . traMADol (ULTRAM) 50 MG tablet Take 50-100 mg by mouth every 6 (six) hours as needed for pain.       No current facility-administered medications for this visit.     Past Medical History  Diagnosis Date  . Hypertension   . Type II or unspecified type diabetes mellitus without mention of complication, not stated as uncontrolled     ROS:   All systems reviewed and negative except as noted in the HPI.   Past Surgical History  Procedure Laterality Date  . Pacemaker insertion      Medtronic, [facility] 2003     No family history on file.   History   Social History  . Marital Status: Widowed    Spouse Name: N/A    Number of Children: N/A  . Years of Education: N/A    Occupational History  . Not on file.   Social History Main Topics  . Smoking status: Never Smoker   . Smokeless tobacco: Current User    Types: Snuff  . Alcohol Use: 0.6 oz/week    1 Cans of beer per week  . Drug Use: No  . Sexual Activity: Not on file   Other Topics Concern  . Not on file   Social History Narrative  . No narrative on file     BP 118/71  Pulse 96  Ht 5\' 2"  (1.575 m)  Wt 145 lb (65.772 kg)  BMI 26.51 kg/m2  Physical Exam:  Well appearing 79 year old woman, NAD HEENT: Unremarkable Neck:  6 cm JVD, no thyromegally Back:  No CVA tenderness Lungs:  Clear with no wheezes, rales, or rhonchi. well-healed pacemaker incision. HEART:  Regular rate rhythm, no murmurs, no rubs, no clicks Abd:  soft, positive bowel sounds, no organomegally, no rebound, no guarding Ext:  2 plus pulses, no edema, no cyanosis, no clubbing Skin:  No rashes no nodules Neuro:  CN II through XII intact, motor grossly intact   DEVICE  Normal device function.  See PaceArt for details.   Assess/Plan:

## 2013-06-05 NOTE — Assessment & Plan Note (Signed)
Her pain is musculoskeletal in nature. I've asked the patient to reduce her dose of simvastatin from 40 mg a day down to 20 mg daily.

## 2013-06-05 NOTE — Assessment & Plan Note (Signed)
I have asked the patient to reduce her dose of simvastatin by half. She has some muscle aches, and I worry this may be caused by Zocor. She is instructed to maintain a low-fat diet.

## 2013-06-05 NOTE — Patient Instructions (Signed)
Your physician wants you to follow-up in: 12 months with Dr Court Joy will receive a reminder letter in the mail two months in advance. If you don't receive a letter, please call our office to schedule the follow-up appointment.  Your physician has recommended you make the following change in your medication:  1) decrease Simvastatin to 20mg  daily

## 2013-06-05 NOTE — Assessment & Plan Note (Signed)
Her blood pressure is well controlled. No change in medical therapy. 

## 2013-07-04 DIAGNOSIS — Z23 Encounter for immunization: Secondary | ICD-10-CM | POA: Diagnosis not present

## 2013-07-04 DIAGNOSIS — B009 Herpesviral infection, unspecified: Secondary | ICD-10-CM | POA: Diagnosis not present

## 2013-07-04 DIAGNOSIS — E119 Type 2 diabetes mellitus without complications: Secondary | ICD-10-CM | POA: Diagnosis not present

## 2013-07-04 DIAGNOSIS — E7800 Pure hypercholesterolemia, unspecified: Secondary | ICD-10-CM | POA: Diagnosis not present

## 2013-07-04 DIAGNOSIS — M159 Polyosteoarthritis, unspecified: Secondary | ICD-10-CM | POA: Diagnosis not present

## 2013-07-04 DIAGNOSIS — I10 Essential (primary) hypertension: Secondary | ICD-10-CM | POA: Diagnosis not present

## 2013-09-10 ENCOUNTER — Encounter: Payer: Medicare Other | Admitting: *Deleted

## 2013-09-20 ENCOUNTER — Encounter: Payer: Self-pay | Admitting: *Deleted

## 2013-09-21 ENCOUNTER — Other Ambulatory Visit: Payer: Self-pay | Admitting: Physician Assistant

## 2013-09-21 ENCOUNTER — Ambulatory Visit
Admission: RE | Admit: 2013-09-21 | Discharge: 2013-09-21 | Disposition: A | Discharge status: Home / Self Care | Payer: PRIVATE HEALTH INSURANCE | Source: Ambulatory Visit | Attending: Physician Assistant | Admitting: Physician Assistant

## 2013-09-21 DIAGNOSIS — R062 Wheezing: Secondary | ICD-10-CM | POA: Diagnosis not present

## 2013-09-21 DIAGNOSIS — R05 Cough: Secondary | ICD-10-CM | POA: Diagnosis not present

## 2013-09-21 DIAGNOSIS — R059 Cough, unspecified: Secondary | ICD-10-CM | POA: Diagnosis not present

## 2013-09-21 DIAGNOSIS — R0602 Shortness of breath: Secondary | ICD-10-CM

## 2013-09-21 DIAGNOSIS — R0989 Other specified symptoms and signs involving the circulatory and respiratory systems: Secondary | ICD-10-CM | POA: Diagnosis not present

## 2013-09-21 DIAGNOSIS — R0609 Other forms of dyspnea: Secondary | ICD-10-CM | POA: Diagnosis not present

## 2013-10-04 DIAGNOSIS — M159 Polyosteoarthritis, unspecified: Secondary | ICD-10-CM | POA: Diagnosis not present

## 2013-10-04 DIAGNOSIS — R0602 Shortness of breath: Secondary | ICD-10-CM | POA: Diagnosis not present

## 2013-10-05 ENCOUNTER — Telehealth: Payer: Self-pay | Admitting: *Deleted

## 2013-10-05 ENCOUNTER — Encounter: Payer: Medicare Other | Admitting: *Deleted

## 2013-10-05 DIAGNOSIS — I442 Atrioventricular block, complete: Secondary | ICD-10-CM

## 2013-10-05 DIAGNOSIS — Z950 Presence of cardiac pacemaker: Secondary | ICD-10-CM

## 2013-10-05 LAB — MDC_IDC_ENUM_SESS_TYPE_REMOTE
Brady Statistic AP VP Percent: 28.3 %
Brady Statistic AP VS Percent: 0.1 % — CL
Brady Statistic AS VP Percent: 71.4 %
Lead Channel Pacing Threshold Amplitude: 0.375 V
Lead Channel Pacing Threshold Pulse Width: 0.4 ms
Lead Channel Sensing Intrinsic Amplitude: 2.8 mV
Lead Channel Setting Pacing Amplitude: 2 V
Lead Channel Setting Pacing Amplitude: 2.5 V
Lead Channel Setting Pacing Pulse Width: 0.4 ms
MDC IDC MSMT BATTERY VOLTAGE: 2.78 V
MDC IDC MSMT LEADCHNL RA IMPEDANCE VALUE: 729 Ohm
MDC IDC MSMT LEADCHNL RA PACING THRESHOLD PULSEWIDTH: 0.4 ms
MDC IDC MSMT LEADCHNL RV IMPEDANCE VALUE: 741 Ohm
MDC IDC MSMT LEADCHNL RV PACING THRESHOLD AMPLITUDE: 0.625 V
MDC IDC SET LEADCHNL RV SENSING SENSITIVITY: 2.8 mV
MDC IDC STAT BRADY AS VS PERCENT: 0.4 %

## 2013-10-05 NOTE — Telephone Encounter (Signed)
Gave pt step by step instructions to send test transmission. Transmission successfully received.   Next remote 01/07/14.

## 2013-10-09 ENCOUNTER — Encounter: Payer: Self-pay | Admitting: *Deleted

## 2013-10-15 ENCOUNTER — Other Ambulatory Visit: Payer: Self-pay

## 2013-10-15 DIAGNOSIS — Z9889 Other specified postprocedural states: Secondary | ICD-10-CM

## 2013-10-15 DIAGNOSIS — Z1231 Encounter for screening mammogram for malignant neoplasm of breast: Secondary | ICD-10-CM

## 2013-10-29 ENCOUNTER — Encounter: Payer: Self-pay | Admitting: Internal Medicine

## 2013-11-01 ENCOUNTER — Ambulatory Visit
Admission: RE | Admit: 2013-11-01 | Discharge: 2013-11-01 | Disposition: A | Discharge status: Home / Self Care | Payer: Medicare Other | Source: Ambulatory Visit

## 2013-11-01 DIAGNOSIS — Z853 Personal history of malignant neoplasm of breast: Secondary | ICD-10-CM | POA: Diagnosis not present

## 2013-11-01 DIAGNOSIS — Z1231 Encounter for screening mammogram for malignant neoplasm of breast: Secondary | ICD-10-CM | POA: Diagnosis not present

## 2013-11-01 DIAGNOSIS — Z9889 Other specified postprocedural states: Secondary | ICD-10-CM

## 2014-01-02 DIAGNOSIS — I10 Essential (primary) hypertension: Secondary | ICD-10-CM | POA: Diagnosis not present

## 2014-01-02 DIAGNOSIS — E119 Type 2 diabetes mellitus without complications: Secondary | ICD-10-CM | POA: Diagnosis not present

## 2014-01-02 DIAGNOSIS — E7800 Pure hypercholesterolemia, unspecified: Secondary | ICD-10-CM | POA: Diagnosis not present

## 2014-01-02 DIAGNOSIS — M159 Polyosteoarthritis, unspecified: Secondary | ICD-10-CM | POA: Diagnosis not present

## 2014-01-02 DIAGNOSIS — B009 Herpesviral infection, unspecified: Secondary | ICD-10-CM | POA: Diagnosis not present

## 2014-01-07 ENCOUNTER — Encounter: Payer: Self-pay | Admitting: Internal Medicine

## 2014-01-07 ENCOUNTER — Ambulatory Visit (INDEPENDENT_AMBULATORY_CARE_PROVIDER_SITE_OTHER): Payer: Medicare Other | Admitting: *Deleted

## 2014-01-07 DIAGNOSIS — I442 Atrioventricular block, complete: Secondary | ICD-10-CM | POA: Diagnosis not present

## 2014-01-07 LAB — MDC_IDC_ENUM_SESS_TYPE_REMOTE
Battery Impedance: 670 Ohm
Battery Remaining Longevity: 68 mo
Battery Voltage: 2.79 V
Brady Statistic AP VS Percent: 0 %
Lead Channel Pacing Threshold Amplitude: 0.375 V
Lead Channel Pacing Threshold Amplitude: 0.625 V
Lead Channel Pacing Threshold Pulse Width: 0.4 ms
Lead Channel Setting Pacing Amplitude: 2 V
Lead Channel Setting Pacing Amplitude: 2.5 V
Lead Channel Setting Pacing Pulse Width: 0.4 ms
MDC IDC MSMT LEADCHNL RA IMPEDANCE VALUE: 729 Ohm
MDC IDC MSMT LEADCHNL RA PACING THRESHOLD PULSEWIDTH: 0.4 ms
MDC IDC MSMT LEADCHNL RA SENSING INTR AMPL: 2.8 mV
MDC IDC MSMT LEADCHNL RV IMPEDANCE VALUE: 779 Ohm
MDC IDC SESS DTM: 20150427192622
MDC IDC SET LEADCHNL RV SENSING SENSITIVITY: 2.8 mV
MDC IDC STAT BRADY AP VP PERCENT: 29 %
MDC IDC STAT BRADY AS VP PERCENT: 71 %
MDC IDC STAT BRADY AS VS PERCENT: 0 %

## 2014-01-24 ENCOUNTER — Encounter: Payer: Self-pay | Admitting: Cardiology

## 2014-04-04 ENCOUNTER — Ambulatory Visit
Admission: RE | Admit: 2014-04-04 | Discharge: 2014-04-04 | Disposition: A | Discharge status: Home / Self Care | Payer: Medicare Other | Source: Ambulatory Visit | Attending: Family Medicine | Admitting: Family Medicine

## 2014-04-04 ENCOUNTER — Other Ambulatory Visit: Payer: Self-pay | Admitting: Family Medicine

## 2014-04-04 DIAGNOSIS — R05 Cough: Secondary | ICD-10-CM | POA: Diagnosis not present

## 2014-04-04 DIAGNOSIS — R059 Cough, unspecified: Secondary | ICD-10-CM | POA: Diagnosis not present

## 2014-04-04 DIAGNOSIS — R0602 Shortness of breath: Secondary | ICD-10-CM | POA: Diagnosis not present

## 2014-04-04 DIAGNOSIS — I10 Essential (primary) hypertension: Secondary | ICD-10-CM | POA: Diagnosis not present

## 2014-04-11 ENCOUNTER — Encounter: Payer: Medicare Other | Admitting: *Deleted

## 2014-04-12 ENCOUNTER — Telehealth: Payer: Self-pay | Admitting: Internal Medicine

## 2014-04-12 NOTE — Telephone Encounter (Signed)
New message  Pt called requests a call back with the report of her remote device check

## 2014-04-12 NOTE — Telephone Encounter (Signed)
Spoke w/pt and instructed on how to send a transmission. Pt was confused with the transmission being sent automatically.

## 2014-04-17 ENCOUNTER — Encounter: Payer: Self-pay | Admitting: *Deleted

## 2014-04-23 ENCOUNTER — Telehealth: Payer: Self-pay | Admitting: Cardiology

## 2014-04-23 NOTE — Telephone Encounter (Signed)
Instructed pt on how to send a manual transmission.

## 2014-05-06 ENCOUNTER — Ambulatory Visit (INDEPENDENT_AMBULATORY_CARE_PROVIDER_SITE_OTHER): Payer: Medicare Other | Admitting: *Deleted

## 2014-05-06 ENCOUNTER — Telehealth: Payer: Self-pay | Admitting: Internal Medicine

## 2014-05-06 ENCOUNTER — Other Ambulatory Visit: Payer: Self-pay | Admitting: Internal Medicine

## 2014-05-06 DIAGNOSIS — I442 Atrioventricular block, complete: Secondary | ICD-10-CM

## 2014-05-06 NOTE — Telephone Encounter (Signed)
Instructions given for remote transmission.  Patient to send later this pm.

## 2014-05-06 NOTE — Telephone Encounter (Signed)
New message     Want a report on her last device check.  Please send it to her home

## 2014-05-06 NOTE — Telephone Encounter (Signed)
Spoke with and informed pt to hook up phone filter for monitor. Instructed pt on how to do this. Pt verbalized understanding she is going to hook up phone filter and send manual transmission.

## 2014-05-06 NOTE — Telephone Encounter (Signed)
New message     Talk to someone in device clinic to help her check her device

## 2014-05-07 NOTE — Progress Notes (Signed)
Remote pacemaker transmission.   

## 2014-05-10 LAB — MDC_IDC_ENUM_SESS_TYPE_REMOTE
Battery Impedance: 799 Ohm
Brady Statistic AP VS Percent: 0 %
Brady Statistic AS VS Percent: 0 %
Date Time Interrogation Session: 20150824194751
Lead Channel Impedance Value: 772 Ohm
Lead Channel Pacing Threshold Amplitude: 0.375 V
Lead Channel Pacing Threshold Amplitude: 0.625 V
Lead Channel Pacing Threshold Pulse Width: 0.4 ms
Lead Channel Pacing Threshold Pulse Width: 0.4 ms
Lead Channel Setting Pacing Amplitude: 2.5 V
Lead Channel Setting Sensing Sensitivity: 2.8 mV
MDC IDC MSMT BATTERY REMAINING LONGEVITY: 62 mo
MDC IDC MSMT BATTERY VOLTAGE: 2.79 V
MDC IDC MSMT LEADCHNL RA IMPEDANCE VALUE: 717 Ohm
MDC IDC MSMT LEADCHNL RA SENSING INTR AMPL: 2.8 mV
MDC IDC SET LEADCHNL RA PACING AMPLITUDE: 2 V
MDC IDC SET LEADCHNL RV PACING PULSEWIDTH: 0.4 ms
MDC IDC STAT BRADY AP VP PERCENT: 28 %
MDC IDC STAT BRADY AS VP PERCENT: 72 %

## 2014-05-14 DIAGNOSIS — M169 Osteoarthritis of hip, unspecified: Secondary | ICD-10-CM | POA: Diagnosis not present

## 2014-05-14 DIAGNOSIS — M1610 Unilateral primary osteoarthritis, unspecified hip: Secondary | ICD-10-CM | POA: Diagnosis not present

## 2014-05-14 DIAGNOSIS — M25569 Pain in unspecified knee: Secondary | ICD-10-CM | POA: Diagnosis not present

## 2014-05-20 DIAGNOSIS — M5440 Lumbago with sciatica, unspecified side: Secondary | ICD-10-CM | POA: Diagnosis not present

## 2014-05-20 DIAGNOSIS — E119 Type 2 diabetes mellitus without complications: Secondary | ICD-10-CM | POA: Diagnosis not present

## 2014-05-20 DIAGNOSIS — M25561 Pain in right knee: Secondary | ICD-10-CM | POA: Diagnosis not present

## 2014-05-20 DIAGNOSIS — Z5189 Encounter for other specified aftercare: Secondary | ICD-10-CM | POA: Diagnosis not present

## 2014-05-20 DIAGNOSIS — I10 Essential (primary) hypertension: Secondary | ICD-10-CM | POA: Diagnosis not present

## 2014-05-20 DIAGNOSIS — M549 Dorsalgia, unspecified: Secondary | ICD-10-CM | POA: Diagnosis not present

## 2014-05-20 DIAGNOSIS — Z45018 Encounter for adjustment and management of other part of cardiac pacemaker: Secondary | ICD-10-CM | POA: Diagnosis not present

## 2014-05-20 DIAGNOSIS — M25562 Pain in left knee: Secondary | ICD-10-CM | POA: Diagnosis not present

## 2014-05-21 DIAGNOSIS — M25561 Pain in right knee: Secondary | ICD-10-CM | POA: Diagnosis not present

## 2014-05-21 DIAGNOSIS — M5440 Lumbago with sciatica, unspecified side: Secondary | ICD-10-CM | POA: Diagnosis not present

## 2014-05-21 DIAGNOSIS — Z5189 Encounter for other specified aftercare: Secondary | ICD-10-CM | POA: Diagnosis not present

## 2014-05-21 DIAGNOSIS — E119 Type 2 diabetes mellitus without complications: Secondary | ICD-10-CM | POA: Diagnosis not present

## 2014-05-21 DIAGNOSIS — M549 Dorsalgia, unspecified: Secondary | ICD-10-CM | POA: Diagnosis not present

## 2014-05-21 DIAGNOSIS — M25562 Pain in left knee: Secondary | ICD-10-CM | POA: Diagnosis not present

## 2014-05-22 DIAGNOSIS — E119 Type 2 diabetes mellitus without complications: Secondary | ICD-10-CM | POA: Diagnosis not present

## 2014-05-22 DIAGNOSIS — M25561 Pain in right knee: Secondary | ICD-10-CM | POA: Diagnosis not present

## 2014-05-22 DIAGNOSIS — M549 Dorsalgia, unspecified: Secondary | ICD-10-CM | POA: Diagnosis not present

## 2014-05-22 DIAGNOSIS — Z5189 Encounter for other specified aftercare: Secondary | ICD-10-CM | POA: Diagnosis not present

## 2014-05-22 DIAGNOSIS — M5440 Lumbago with sciatica, unspecified side: Secondary | ICD-10-CM | POA: Diagnosis not present

## 2014-05-22 DIAGNOSIS — M25562 Pain in left knee: Secondary | ICD-10-CM | POA: Diagnosis not present

## 2014-05-23 ENCOUNTER — Encounter: Payer: Self-pay | Admitting: Cardiology

## 2014-05-23 DIAGNOSIS — M5440 Lumbago with sciatica, unspecified side: Secondary | ICD-10-CM | POA: Diagnosis not present

## 2014-05-23 DIAGNOSIS — E119 Type 2 diabetes mellitus without complications: Secondary | ICD-10-CM | POA: Diagnosis not present

## 2014-05-23 DIAGNOSIS — Z5189 Encounter for other specified aftercare: Secondary | ICD-10-CM | POA: Diagnosis not present

## 2014-05-23 DIAGNOSIS — M25562 Pain in left knee: Secondary | ICD-10-CM | POA: Diagnosis not present

## 2014-05-23 DIAGNOSIS — M549 Dorsalgia, unspecified: Secondary | ICD-10-CM | POA: Diagnosis not present

## 2014-05-23 DIAGNOSIS — M25561 Pain in right knee: Secondary | ICD-10-CM | POA: Diagnosis not present

## 2014-05-27 DIAGNOSIS — M5440 Lumbago with sciatica, unspecified side: Secondary | ICD-10-CM | POA: Diagnosis not present

## 2014-05-27 DIAGNOSIS — Z5189 Encounter for other specified aftercare: Secondary | ICD-10-CM | POA: Diagnosis not present

## 2014-05-27 DIAGNOSIS — M549 Dorsalgia, unspecified: Secondary | ICD-10-CM | POA: Diagnosis not present

## 2014-05-27 DIAGNOSIS — M25562 Pain in left knee: Secondary | ICD-10-CM | POA: Diagnosis not present

## 2014-05-27 DIAGNOSIS — M25561 Pain in right knee: Secondary | ICD-10-CM | POA: Diagnosis not present

## 2014-05-27 DIAGNOSIS — E119 Type 2 diabetes mellitus without complications: Secondary | ICD-10-CM | POA: Diagnosis not present

## 2014-05-28 DIAGNOSIS — Z5189 Encounter for other specified aftercare: Secondary | ICD-10-CM | POA: Diagnosis not present

## 2014-05-28 DIAGNOSIS — M25562 Pain in left knee: Secondary | ICD-10-CM | POA: Diagnosis not present

## 2014-05-28 DIAGNOSIS — E119 Type 2 diabetes mellitus without complications: Secondary | ICD-10-CM | POA: Diagnosis not present

## 2014-05-28 DIAGNOSIS — M5440 Lumbago with sciatica, unspecified side: Secondary | ICD-10-CM | POA: Diagnosis not present

## 2014-05-28 DIAGNOSIS — M549 Dorsalgia, unspecified: Secondary | ICD-10-CM | POA: Diagnosis not present

## 2014-05-28 DIAGNOSIS — M25561 Pain in right knee: Secondary | ICD-10-CM | POA: Diagnosis not present

## 2014-05-29 DIAGNOSIS — E119 Type 2 diabetes mellitus without complications: Secondary | ICD-10-CM | POA: Diagnosis not present

## 2014-05-29 DIAGNOSIS — Z5189 Encounter for other specified aftercare: Secondary | ICD-10-CM | POA: Diagnosis not present

## 2014-05-29 DIAGNOSIS — M25561 Pain in right knee: Secondary | ICD-10-CM | POA: Diagnosis not present

## 2014-05-29 DIAGNOSIS — M25562 Pain in left knee: Secondary | ICD-10-CM | POA: Diagnosis not present

## 2014-05-29 DIAGNOSIS — M5440 Lumbago with sciatica, unspecified side: Secondary | ICD-10-CM | POA: Diagnosis not present

## 2014-05-29 DIAGNOSIS — M549 Dorsalgia, unspecified: Secondary | ICD-10-CM | POA: Diagnosis not present

## 2014-05-30 DIAGNOSIS — M549 Dorsalgia, unspecified: Secondary | ICD-10-CM | POA: Diagnosis not present

## 2014-05-30 DIAGNOSIS — M25562 Pain in left knee: Secondary | ICD-10-CM | POA: Diagnosis not present

## 2014-05-30 DIAGNOSIS — E119 Type 2 diabetes mellitus without complications: Secondary | ICD-10-CM | POA: Diagnosis not present

## 2014-05-30 DIAGNOSIS — M25561 Pain in right knee: Secondary | ICD-10-CM | POA: Diagnosis not present

## 2014-05-30 DIAGNOSIS — M5440 Lumbago with sciatica, unspecified side: Secondary | ICD-10-CM | POA: Diagnosis not present

## 2014-05-30 DIAGNOSIS — Z5189 Encounter for other specified aftercare: Secondary | ICD-10-CM | POA: Diagnosis not present

## 2014-05-31 DIAGNOSIS — M5440 Lumbago with sciatica, unspecified side: Secondary | ICD-10-CM | POA: Diagnosis not present

## 2014-05-31 DIAGNOSIS — M549 Dorsalgia, unspecified: Secondary | ICD-10-CM | POA: Diagnosis not present

## 2014-05-31 DIAGNOSIS — Z5189 Encounter for other specified aftercare: Secondary | ICD-10-CM | POA: Diagnosis not present

## 2014-05-31 DIAGNOSIS — E119 Type 2 diabetes mellitus without complications: Secondary | ICD-10-CM | POA: Diagnosis not present

## 2014-05-31 DIAGNOSIS — M25561 Pain in right knee: Secondary | ICD-10-CM | POA: Diagnosis not present

## 2014-05-31 DIAGNOSIS — M25562 Pain in left knee: Secondary | ICD-10-CM | POA: Diagnosis not present

## 2014-06-03 DIAGNOSIS — M549 Dorsalgia, unspecified: Secondary | ICD-10-CM | POA: Diagnosis not present

## 2014-06-03 DIAGNOSIS — Z5189 Encounter for other specified aftercare: Secondary | ICD-10-CM | POA: Diagnosis not present

## 2014-06-03 DIAGNOSIS — M5440 Lumbago with sciatica, unspecified side: Secondary | ICD-10-CM | POA: Diagnosis not present

## 2014-06-03 DIAGNOSIS — E119 Type 2 diabetes mellitus without complications: Secondary | ICD-10-CM | POA: Diagnosis not present

## 2014-06-03 DIAGNOSIS — M25561 Pain in right knee: Secondary | ICD-10-CM | POA: Diagnosis not present

## 2014-06-03 DIAGNOSIS — M25562 Pain in left knee: Secondary | ICD-10-CM | POA: Diagnosis not present

## 2014-06-04 ENCOUNTER — Encounter: Payer: Self-pay | Admitting: Internal Medicine

## 2014-06-04 ENCOUNTER — Ambulatory Visit (INDEPENDENT_AMBULATORY_CARE_PROVIDER_SITE_OTHER): Payer: Medicare Other | Admitting: Internal Medicine

## 2014-06-04 VITALS — BP 140/68 | HR 87 | Ht 62.0 in | Wt 154.2 lb

## 2014-06-04 DIAGNOSIS — Z950 Presence of cardiac pacemaker: Secondary | ICD-10-CM | POA: Diagnosis not present

## 2014-06-04 DIAGNOSIS — I10 Essential (primary) hypertension: Secondary | ICD-10-CM

## 2014-06-04 DIAGNOSIS — I442 Atrioventricular block, complete: Secondary | ICD-10-CM | POA: Diagnosis not present

## 2014-06-04 LAB — MDC_IDC_ENUM_SESS_TYPE_INCLINIC
Battery Impedance: 797 Ohm
Brady Statistic AP VP Percent: 27.1 %
Brady Statistic AP VS Percent: 0.1 % — CL
Brady Statistic AS VS Percent: 0.3 %
Lead Channel Impedance Value: 704 Ohm
Lead Channel Impedance Value: 795 Ohm
Lead Channel Pacing Threshold Amplitude: 0.5 V
Lead Channel Pacing Threshold Amplitude: 0.75 V
Lead Channel Sensing Intrinsic Amplitude: 4 mV
Lead Channel Setting Pacing Amplitude: 2 V
MDC IDC MSMT BATTERY VOLTAGE: 2.78 V
MDC IDC MSMT LEADCHNL RA PACING THRESHOLD PULSEWIDTH: 0.4 ms
MDC IDC MSMT LEADCHNL RV PACING THRESHOLD PULSEWIDTH: 0.4 ms
MDC IDC SET LEADCHNL RV PACING AMPLITUDE: 2.5 V
MDC IDC SET LEADCHNL RV PACING PULSEWIDTH: 0.4 ms
MDC IDC SET LEADCHNL RV SENSING SENSITIVITY: 2.8 mV
MDC IDC STAT BRADY AS VP PERCENT: 72.5 %

## 2014-06-04 NOTE — Patient Instructions (Signed)
Your physician wants you to follow-up in: 12 months with Dr Knox Saliva will receive a reminder letter in the mail two months in advance. If you don't receive a letter, please call our office to schedule the follow-up appointment.    Remote monitoring is used to monitor your Pacemaker of ICD from home. This monitoring reduces the number of office visits required to check your device to one time per year. It allows [place] to keep an eye on the functioning of your device to ensure it is working properly. You are scheduled for a device check from home on 09/05/14. You may send your transmission at any time that day. If you have a wireless device, the transmission will be sent automatically. After your physician reviews your transmission, you will receive a postcard with your next transmission date.

## 2014-06-04 NOTE — Progress Notes (Signed)
HPI Maria [name] returns for ongoing PPM management. She is a very pleasant 80 year old woman who looks much younger than her stated age, with a history of hypertension and dyslipidemia, status post permanent pacemaker insertion due to complete heart block.  She has not had syncope. She does have occasional pain in her chest area which is reproducible with palpation. She denies shortness of breath. She has occasional dizzy spells. No peripheral edema has been complained of. She does note occasional cramps in her lower extremities. She is bothered by arthritis. No Known Allergies   Current Outpatient Prescriptions  Medication Sig Dispense Refill  . aspirin EC 81 MG tablet Take 81 mg by mouth daily.      . meclizine (ANTIVERT) 25 MG tablet Take 12.5 mg by mouth 3 (three) times daily as needed for dizziness.      Marland Kitchen OVER THE COUNTER MEDICATION Place 2 drops into both eyes daily as needed (for dry eyes).      . potassium chloride SA (K-DUR,KLOR-CON) 20 MEQ tablet Take 20 mEq by mouth daily.      . simvastatin (ZOCOR) 40 MG tablet Take 0.5 tablets (20 mg total) by mouth every evening.  30 tablet    . traMADol (ULTRAM) 50 MG tablet Take 50-100 mg by mouth every 6 (six) hours as needed for pain.      . moexipril-hydrochlorothiazide (UNIRETIC) 15-25 MG per tablet Take 1 tablet by mouth daily. CURRENTLY ON HOLD (06/04/14)       No current facility-administered medications for this visit.     Past Medical History  Diagnosis Date  . Hypertension   . Type II or unspecified type diabetes mellitus without mention of complication, not stated as uncontrolled   . DM type 2 (diabetes mellitus, type 2)   . Cardiac pacemaker in situ   . Dyslipidemia   . Chest pain     ROS:   All systems reviewed and negative except as noted in the HPI.   Past Surgical History  Procedure Laterality Date  . Pacemaker insertion      Medtronic, [facility] 2003     No family history on file.   History   Social  History  . Marital Status: Widowed    Spouse Name: N/A    Number of Children: N/A  . Years of Education: N/A   Occupational History  . Not on file.   Social History Main Topics  . Smoking status: Never Smoker   . Smokeless tobacco: Current User    Types: Snuff  . Alcohol Use: 0.6 oz/week    1 Cans of beer per week  . Drug Use: No  . Sexual Activity: Not on file   Other Topics Concern  . Not on file   Social History Narrative  . No narrative on file     BP 140/68  Pulse 87  Ht 5\' 2"  (1.575 m)  Wt 154 lb 3.2 oz (69.945 kg)  BMI 28.20 kg/m2  Physical Exam:  Well appearing 80 year old woman, NAD HEENT: Unremarkable Neck:  6 cm JVD, no thyromegally Back:  No CVA tenderness Lungs:  Clear with no wheezes, rales, or rhonchi. well-healed pacemaker incision. HEART:  Regular rate rhythm, no murmurs, no rubs, no clicks Abd:  soft, positive bowel sounds, no organomegally, no rebound, no guarding Ext:  2 plus pulses, no edema, no cyanosis, no clubbing Skin:  No rashes no nodules Neuro:  CN II through XII intact, motor grossly intact   DEVICE  Normal device  function.  See PaceArt for details.   Assess/Plan:

## 2014-06-04 NOTE — Assessment & Plan Note (Signed)
Her Medtronic dual-chamber pacemaker is working normally. She is pacing approximately 99% of the time in the ventricle with underlying complete heart block. We'll plan to recheck in several months.

## 2014-06-04 NOTE — Assessment & Plan Note (Signed)
Her blood pressure has been fairly well-controlled. No change in medical therapy. She will maintain a low-sodium diet.

## 2014-06-05 DIAGNOSIS — M25562 Pain in left knee: Secondary | ICD-10-CM | POA: Diagnosis not present

## 2014-06-05 DIAGNOSIS — M25561 Pain in right knee: Secondary | ICD-10-CM | POA: Diagnosis not present

## 2014-06-05 DIAGNOSIS — Z5189 Encounter for other specified aftercare: Secondary | ICD-10-CM | POA: Diagnosis not present

## 2014-06-05 DIAGNOSIS — E119 Type 2 diabetes mellitus without complications: Secondary | ICD-10-CM | POA: Diagnosis not present

## 2014-06-05 DIAGNOSIS — M549 Dorsalgia, unspecified: Secondary | ICD-10-CM | POA: Diagnosis not present

## 2014-06-05 DIAGNOSIS — M5440 Lumbago with sciatica, unspecified side: Secondary | ICD-10-CM | POA: Diagnosis not present

## 2014-06-06 ENCOUNTER — Encounter: Payer: Self-pay | Admitting: Internal Medicine

## 2014-06-06 DIAGNOSIS — M549 Dorsalgia, unspecified: Secondary | ICD-10-CM | POA: Diagnosis not present

## 2014-06-06 DIAGNOSIS — M25561 Pain in right knee: Secondary | ICD-10-CM | POA: Diagnosis not present

## 2014-06-06 DIAGNOSIS — Z5189 Encounter for other specified aftercare: Secondary | ICD-10-CM | POA: Diagnosis not present

## 2014-06-06 DIAGNOSIS — E119 Type 2 diabetes mellitus without complications: Secondary | ICD-10-CM | POA: Diagnosis not present

## 2014-06-06 DIAGNOSIS — M5440 Lumbago with sciatica, unspecified side: Secondary | ICD-10-CM | POA: Diagnosis not present

## 2014-06-06 DIAGNOSIS — M25562 Pain in left knee: Secondary | ICD-10-CM | POA: Diagnosis not present

## 2014-06-07 DIAGNOSIS — Z5189 Encounter for other specified aftercare: Secondary | ICD-10-CM | POA: Diagnosis not present

## 2014-06-07 DIAGNOSIS — M5440 Lumbago with sciatica, unspecified side: Secondary | ICD-10-CM | POA: Diagnosis not present

## 2014-06-07 DIAGNOSIS — M25561 Pain in right knee: Secondary | ICD-10-CM | POA: Diagnosis not present

## 2014-06-07 DIAGNOSIS — E119 Type 2 diabetes mellitus without complications: Secondary | ICD-10-CM | POA: Diagnosis not present

## 2014-06-07 DIAGNOSIS — M549 Dorsalgia, unspecified: Secondary | ICD-10-CM | POA: Diagnosis not present

## 2014-06-07 DIAGNOSIS — M25562 Pain in left knee: Secondary | ICD-10-CM | POA: Diagnosis not present

## 2014-06-10 DIAGNOSIS — M5440 Lumbago with sciatica, unspecified side: Secondary | ICD-10-CM | POA: Diagnosis not present

## 2014-06-10 DIAGNOSIS — M25562 Pain in left knee: Secondary | ICD-10-CM | POA: Diagnosis not present

## 2014-06-10 DIAGNOSIS — E119 Type 2 diabetes mellitus without complications: Secondary | ICD-10-CM | POA: Diagnosis not present

## 2014-06-10 DIAGNOSIS — M25561 Pain in right knee: Secondary | ICD-10-CM | POA: Diagnosis not present

## 2014-06-10 DIAGNOSIS — Z5189 Encounter for other specified aftercare: Secondary | ICD-10-CM | POA: Diagnosis not present

## 2014-06-10 DIAGNOSIS — M549 Dorsalgia, unspecified: Secondary | ICD-10-CM | POA: Diagnosis not present

## 2014-06-11 DIAGNOSIS — M25561 Pain in right knee: Secondary | ICD-10-CM | POA: Diagnosis not present

## 2014-06-11 DIAGNOSIS — M25562 Pain in left knee: Secondary | ICD-10-CM | POA: Diagnosis not present

## 2014-06-11 DIAGNOSIS — E119 Type 2 diabetes mellitus without complications: Secondary | ICD-10-CM | POA: Diagnosis not present

## 2014-06-11 DIAGNOSIS — M5440 Lumbago with sciatica, unspecified side: Secondary | ICD-10-CM | POA: Diagnosis not present

## 2014-06-11 DIAGNOSIS — Z5189 Encounter for other specified aftercare: Secondary | ICD-10-CM | POA: Diagnosis not present

## 2014-06-11 DIAGNOSIS — M549 Dorsalgia, unspecified: Secondary | ICD-10-CM | POA: Diagnosis not present

## 2014-06-12 DIAGNOSIS — M549 Dorsalgia, unspecified: Secondary | ICD-10-CM | POA: Diagnosis not present

## 2014-06-12 DIAGNOSIS — E119 Type 2 diabetes mellitus without complications: Secondary | ICD-10-CM | POA: Diagnosis not present

## 2014-06-12 DIAGNOSIS — M25562 Pain in left knee: Secondary | ICD-10-CM | POA: Diagnosis not present

## 2014-06-12 DIAGNOSIS — M5440 Lumbago with sciatica, unspecified side: Secondary | ICD-10-CM | POA: Diagnosis not present

## 2014-06-12 DIAGNOSIS — M25561 Pain in right knee: Secondary | ICD-10-CM | POA: Diagnosis not present

## 2014-06-12 DIAGNOSIS — Z5189 Encounter for other specified aftercare: Secondary | ICD-10-CM | POA: Diagnosis not present

## 2014-06-13 DIAGNOSIS — M25561 Pain in right knee: Secondary | ICD-10-CM | POA: Diagnosis not present

## 2014-06-13 DIAGNOSIS — E119 Type 2 diabetes mellitus without complications: Secondary | ICD-10-CM | POA: Diagnosis not present

## 2014-06-13 DIAGNOSIS — M549 Dorsalgia, unspecified: Secondary | ICD-10-CM | POA: Diagnosis not present

## 2014-06-13 DIAGNOSIS — M5440 Lumbago with sciatica, unspecified side: Secondary | ICD-10-CM | POA: Diagnosis not present

## 2014-06-13 DIAGNOSIS — M25562 Pain in left knee: Secondary | ICD-10-CM | POA: Diagnosis not present

## 2014-06-13 DIAGNOSIS — Z5189 Encounter for other specified aftercare: Secondary | ICD-10-CM | POA: Diagnosis not present

## 2014-06-17 DIAGNOSIS — M25561 Pain in right knee: Secondary | ICD-10-CM | POA: Diagnosis not present

## 2014-06-17 DIAGNOSIS — M5440 Lumbago with sciatica, unspecified side: Secondary | ICD-10-CM | POA: Diagnosis not present

## 2014-06-17 DIAGNOSIS — Z5189 Encounter for other specified aftercare: Secondary | ICD-10-CM | POA: Diagnosis not present

## 2014-06-17 DIAGNOSIS — M549 Dorsalgia, unspecified: Secondary | ICD-10-CM | POA: Diagnosis not present

## 2014-06-17 DIAGNOSIS — M25562 Pain in left knee: Secondary | ICD-10-CM | POA: Diagnosis not present

## 2014-06-17 DIAGNOSIS — E119 Type 2 diabetes mellitus without complications: Secondary | ICD-10-CM | POA: Diagnosis not present

## 2014-06-18 DIAGNOSIS — Z5189 Encounter for other specified aftercare: Secondary | ICD-10-CM | POA: Diagnosis not present

## 2014-06-18 DIAGNOSIS — M5440 Lumbago with sciatica, unspecified side: Secondary | ICD-10-CM | POA: Diagnosis not present

## 2014-06-18 DIAGNOSIS — M549 Dorsalgia, unspecified: Secondary | ICD-10-CM | POA: Diagnosis not present

## 2014-06-18 DIAGNOSIS — E119 Type 2 diabetes mellitus without complications: Secondary | ICD-10-CM | POA: Diagnosis not present

## 2014-06-18 DIAGNOSIS — M25561 Pain in right knee: Secondary | ICD-10-CM | POA: Diagnosis not present

## 2014-06-18 DIAGNOSIS — M25562 Pain in left knee: Secondary | ICD-10-CM | POA: Diagnosis not present

## 2014-06-19 DIAGNOSIS — M5440 Lumbago with sciatica, unspecified side: Secondary | ICD-10-CM | POA: Diagnosis not present

## 2014-06-19 DIAGNOSIS — E119 Type 2 diabetes mellitus without complications: Secondary | ICD-10-CM | POA: Diagnosis not present

## 2014-06-19 DIAGNOSIS — M25562 Pain in left knee: Secondary | ICD-10-CM | POA: Diagnosis not present

## 2014-06-19 DIAGNOSIS — M549 Dorsalgia, unspecified: Secondary | ICD-10-CM | POA: Diagnosis not present

## 2014-06-19 DIAGNOSIS — Z5189 Encounter for other specified aftercare: Secondary | ICD-10-CM | POA: Diagnosis not present

## 2014-06-19 DIAGNOSIS — M25561 Pain in right knee: Secondary | ICD-10-CM | POA: Diagnosis not present

## 2014-06-20 DIAGNOSIS — M25562 Pain in left knee: Secondary | ICD-10-CM | POA: Diagnosis not present

## 2014-06-20 DIAGNOSIS — M25561 Pain in right knee: Secondary | ICD-10-CM | POA: Diagnosis not present

## 2014-06-20 DIAGNOSIS — Z5189 Encounter for other specified aftercare: Secondary | ICD-10-CM | POA: Diagnosis not present

## 2014-06-20 DIAGNOSIS — M549 Dorsalgia, unspecified: Secondary | ICD-10-CM | POA: Diagnosis not present

## 2014-06-20 DIAGNOSIS — E119 Type 2 diabetes mellitus without complications: Secondary | ICD-10-CM | POA: Diagnosis not present

## 2014-06-20 DIAGNOSIS — M5440 Lumbago with sciatica, unspecified side: Secondary | ICD-10-CM | POA: Diagnosis not present

## 2014-06-24 DIAGNOSIS — M25562 Pain in left knee: Secondary | ICD-10-CM | POA: Diagnosis not present

## 2014-06-24 DIAGNOSIS — M5440 Lumbago with sciatica, unspecified side: Secondary | ICD-10-CM | POA: Diagnosis not present

## 2014-06-24 DIAGNOSIS — Z5189 Encounter for other specified aftercare: Secondary | ICD-10-CM | POA: Diagnosis not present

## 2014-06-24 DIAGNOSIS — E119 Type 2 diabetes mellitus without complications: Secondary | ICD-10-CM | POA: Diagnosis not present

## 2014-06-24 DIAGNOSIS — M549 Dorsalgia, unspecified: Secondary | ICD-10-CM | POA: Diagnosis not present

## 2014-06-24 DIAGNOSIS — M25561 Pain in right knee: Secondary | ICD-10-CM | POA: Diagnosis not present

## 2014-07-04 ENCOUNTER — Ambulatory Visit
Admission: RE | Admit: 2014-07-04 | Discharge: 2014-07-04 | Disposition: A | Discharge status: Home / Self Care | Payer: Medicare Other | Source: Ambulatory Visit | Attending: Family Medicine | Admitting: Family Medicine

## 2014-07-04 ENCOUNTER — Other Ambulatory Visit: Payer: Self-pay | Admitting: Family Medicine

## 2014-07-04 DIAGNOSIS — E119 Type 2 diabetes mellitus without complications: Secondary | ICD-10-CM | POA: Diagnosis not present

## 2014-07-04 DIAGNOSIS — M150 Primary generalized (osteo)arthritis: Secondary | ICD-10-CM

## 2014-07-04 DIAGNOSIS — I10 Essential (primary) hypertension: Secondary | ICD-10-CM | POA: Diagnosis not present

## 2014-07-04 DIAGNOSIS — Z23 Encounter for immunization: Secondary | ICD-10-CM | POA: Diagnosis not present

## 2014-07-04 DIAGNOSIS — M1610 Unilateral primary osteoarthritis, unspecified hip: Secondary | ICD-10-CM | POA: Diagnosis not present

## 2014-07-04 DIAGNOSIS — B009 Herpesviral infection, unspecified: Secondary | ICD-10-CM | POA: Diagnosis not present

## 2014-07-09 DIAGNOSIS — Z5189 Encounter for other specified aftercare: Secondary | ICD-10-CM | POA: Diagnosis not present

## 2014-07-09 DIAGNOSIS — M5440 Lumbago with sciatica, unspecified side: Secondary | ICD-10-CM | POA: Diagnosis not present

## 2014-07-09 DIAGNOSIS — M25561 Pain in right knee: Secondary | ICD-10-CM | POA: Diagnosis not present

## 2014-07-09 DIAGNOSIS — M549 Dorsalgia, unspecified: Secondary | ICD-10-CM | POA: Diagnosis not present

## 2014-07-09 DIAGNOSIS — E119 Type 2 diabetes mellitus without complications: Secondary | ICD-10-CM | POA: Diagnosis not present

## 2014-07-09 DIAGNOSIS — M25562 Pain in left knee: Secondary | ICD-10-CM | POA: Diagnosis not present

## 2014-09-05 ENCOUNTER — Encounter: Payer: Medicare Other | Admitting: *Deleted

## 2014-09-11 ENCOUNTER — Encounter: Payer: Self-pay | Admitting: Cardiology

## 2014-09-23 ENCOUNTER — Ambulatory Visit (INDEPENDENT_AMBULATORY_CARE_PROVIDER_SITE_OTHER): Payer: Medicare Other | Admitting: *Deleted

## 2014-09-23 DIAGNOSIS — Z950 Presence of cardiac pacemaker: Secondary | ICD-10-CM

## 2014-09-23 DIAGNOSIS — I442 Atrioventricular block, complete: Secondary | ICD-10-CM | POA: Diagnosis not present

## 2014-09-24 LAB — MDC_IDC_ENUM_SESS_TYPE_REMOTE
Battery Remaining Longevity: 59 mo
Brady Statistic AP VS Percent: 0 %
Brady Statistic AS VS Percent: 0 %
Lead Channel Impedance Value: 793 Ohm
Lead Channel Pacing Threshold Amplitude: 0.625 V
Lead Channel Setting Pacing Amplitude: 2 V
Lead Channel Setting Sensing Sensitivity: 2.8 mV
MDC IDC MSMT BATTERY IMPEDANCE: 901 Ohm
MDC IDC MSMT BATTERY VOLTAGE: 2.78 V
MDC IDC MSMT LEADCHNL RA IMPEDANCE VALUE: 704 Ohm
MDC IDC MSMT LEADCHNL RA PACING THRESHOLD AMPLITUDE: 0.375 V
MDC IDC MSMT LEADCHNL RA PACING THRESHOLD PULSEWIDTH: 0.4 ms
MDC IDC MSMT LEADCHNL RA SENSING INTR AMPL: 2.8 mV
MDC IDC MSMT LEADCHNL RV PACING THRESHOLD PULSEWIDTH: 0.4 ms
MDC IDC SESS DTM: 20160111174105
MDC IDC SET LEADCHNL RV PACING AMPLITUDE: 2.5 V
MDC IDC SET LEADCHNL RV PACING PULSEWIDTH: 0.4 ms
MDC IDC STAT BRADY AP VP PERCENT: 15 %
MDC IDC STAT BRADY AS VP PERCENT: 84 %

## 2014-09-24 NOTE — Progress Notes (Signed)
Pacemaker remote check. Device function reviewed. Impedance, sensing, auto capture thresholds consistent with previous measurements. Histograms appropriate for patient and level of activity. All other diagnostic data reviewed and is appropriate and stable for patient. Real time/magnet EGM shows appropriate sensing and capture. 5 mode switches--- <0.1%. No ventricular high rate episodes. Estimated longevity 81yrs. [facility] 12/23/14 & ROV w/ GT 9/16.

## 2014-09-27 ENCOUNTER — Ambulatory Visit (INDEPENDENT_AMBULATORY_CARE_PROVIDER_SITE_OTHER): Payer: Medicare Other | Admitting: Internal Medicine

## 2014-09-27 ENCOUNTER — Encounter: Payer: Self-pay | Admitting: Internal Medicine

## 2014-09-27 ENCOUNTER — Other Ambulatory Visit (INDEPENDENT_AMBULATORY_CARE_PROVIDER_SITE_OTHER): Payer: Medicare Other

## 2014-09-27 VITALS — BP 120/70 | HR 84 | Ht 62.0 in | Wt 152.5 lb

## 2014-09-27 DIAGNOSIS — R10814 Left lower quadrant abdominal tenderness: Secondary | ICD-10-CM | POA: Diagnosis not present

## 2014-09-27 DIAGNOSIS — R197 Diarrhea, unspecified: Secondary | ICD-10-CM

## 2014-09-27 DIAGNOSIS — R1032 Left lower quadrant pain: Secondary | ICD-10-CM

## 2014-09-27 DIAGNOSIS — E1310 Other specified diabetes mellitus with ketoacidosis without coma: Secondary | ICD-10-CM

## 2014-09-27 DIAGNOSIS — R6881 Early satiety: Secondary | ICD-10-CM

## 2014-09-27 DIAGNOSIS — R1314 Dysphagia, pharyngoesophageal phase: Secondary | ICD-10-CM

## 2014-09-27 DIAGNOSIS — Z79899 Other long term (current) drug therapy: Secondary | ICD-10-CM | POA: Diagnosis not present

## 2014-09-27 DIAGNOSIS — E784 Other hyperlipidemia: Secondary | ICD-10-CM

## 2014-09-27 DIAGNOSIS — E1110 Type 2 diabetes mellitus with ketoacidosis without coma: Secondary | ICD-10-CM

## 2014-09-27 DIAGNOSIS — I10 Essential (primary) hypertension: Secondary | ICD-10-CM | POA: Diagnosis not present

## 2014-09-27 LAB — CBC WITH DIFFERENTIAL/PLATELET
BASOS ABS: 0 10*3/uL (ref 0.0–0.1)
Basophils Relative: 0.7 % (ref 0.0–3.0)
EOS ABS: 0.1 10*3/uL (ref 0.0–0.7)
EOS PCT: 2.4 % (ref 0.0–5.0)
HEMATOCRIT: 41.7 % (ref 36.0–46.0)
Hemoglobin: 13.6 g/dL (ref 12.0–15.0)
LYMPHS ABS: 1.7 10*3/uL (ref 0.7–4.0)
Lymphocytes Relative: 32.1 % (ref 12.0–46.0)
MCHC: 32.7 g/dL (ref 30.0–36.0)
MCV: 85.1 fl (ref 78.0–100.0)
Monocytes Absolute: 0.5 10*3/uL (ref 0.1–1.0)
Monocytes Relative: 9.9 % (ref 3.0–12.0)
Neutro Abs: 2.9 10*3/uL (ref 1.4–7.7)
Neutrophils Relative %: 54.9 % (ref 43.0–77.0)
Platelets: 229 10*3/uL (ref 150.0–400.0)
RBC: 4.9 Mil/uL (ref 3.87–5.11)
RDW: 13.6 % (ref 11.5–15.5)
WBC: 5.4 10*3/uL (ref 4.0–10.5)

## 2014-09-27 LAB — AMYLASE: AMYLASE: 26 U/L — AB (ref 27–131)

## 2014-09-27 LAB — COMPREHENSIVE METABOLIC PANEL
ALBUMIN: 4.5 g/dL (ref 3.5–5.2)
ALT: 28 U/L (ref 0–35)
AST: 20 U/L (ref 0–37)
Alkaline Phosphatase: 48 U/L (ref 39–117)
BILIRUBIN TOTAL: 0.7 mg/dL (ref 0.2–1.2)
BUN: 16 mg/dL (ref 6–23)
CALCIUM: 9.9 mg/dL (ref 8.4–10.5)
CO2: 29 mEq/L (ref 19–32)
Chloride: 98 mEq/L (ref 96–112)
Creatinine, Ser: 0.86 mg/dL (ref 0.40–1.20)
GFR: 80.03 mL/min (ref >60.00)
GLUCOSE: 199 mg/dL — AB (ref 70–99)
Potassium: 3.6 mEq/L (ref 3.5–5.1)
Sodium: 137 mEq/L (ref 135–145)
Total Protein: 8.4 g/dL — ABNORMAL HIGH (ref 6.0–8.3)

## 2014-09-27 LAB — LIPASE: Lipase: 1 U/L — ABNORMAL LOW (ref 11.0–59.0)

## 2014-09-27 LAB — HEMOGLOBIN A1C: HEMOGLOBIN A1C: 8.2 % — AB (ref 4.6–6.5)

## 2014-09-27 NOTE — Progress Notes (Signed)
Subjective:    Patient ID: Maria [name], female    DOB: 12-05-1923, 81 y.o.   MRN: [id number]  HPI  81 y/o female presents to office with complaints of trouble swallowing with nausea and vomiting.  States she feels hungry but is not able to keep her food down after 3-4 bites.  She feels the food is getting stuck in her throat and has early satiety.   Also complains of constant lower left abdominal pain x 1 month with occassional diarrhea.  No signs of bleeding. Has taken no medication for GI issues, but has changed to a liquid diet in the last week which she states has helped.  Recently started on Insulin 3 months ago but states that she does not take it regularly, only taking it 1-2 times a week.  Has not been checking her blood sugar levels regularly.     No Known Allergies Outpatient Prescriptions Prior to Visit  Medication Sig Dispense Refill  . aspirin EC 81 MG tablet Take 81 mg by mouth daily.    . meclizine (ANTIVERT) 25 MG tablet Take 12.5 mg by mouth 3 (three) times daily as needed for dizziness.    Marland Kitchen OVER THE COUNTER MEDICATION Place 2 drops into both eyes daily as needed (for dry eyes).    . potassium chloride SA (K-DUR,KLOR-CON) 20 MEQ tablet Take 20 mEq by mouth daily.    . traMADol (ULTRAM) 50 MG tablet Take 50-100 mg by mouth every 6 (six) hours as needed for pain.    . moexipril-hydrochlorothiazide (UNIRETIC) 15-25 MG per tablet Take 1 tablet by mouth daily. CURRENTLY ON HOLD (06/04/14)    . simvastatin (ZOCOR) 40 MG tablet Take 0.5 tablets (20 mg total) by mouth every evening. 30 tablet    No facility-administered medications prior to visit.   Past Medical History  Diagnosis Date  . Hypertension   . Type II or unspecified type diabetes mellitus without mention of complication, not stated as uncontrolled   . DM type 2 (diabetes mellitus, type 2)   . Cardiac pacemaker in situ   . Dyslipidemia   . Chest pain   . Arthritis   . Anxiety    Past Surgical History    Procedure Laterality Date  . Pacemaker insertion      Medtronic, [facility] 2003  . Total knee arthroplasty Bilateral    History   Social History  . Marital Status: Widowed    Spouse Name: N/A    Number of Children: 2  . Years of Education: N/A   Social History Main Topics  . Smoking status: Never Smoker   . Smokeless tobacco: Current User    Types: Snuff  . Alcohol Use: 0.6 oz/week    1 Cans of beer per week  . Drug Use: No  . Sexual Activity: None   Other Topics Concern  . None   Social History Narrative   History reviewed. No pertinent family history.      Review of Systems  Constitutional: Negative for fever, chills, appetite change and fatigue.  HENT: Positive for trouble swallowing. Negative for sore throat.   Respiratory: Negative for cough, choking and shortness of breath.   Cardiovascular: Negative for chest pain and palpitations.  Gastrointestinal: Positive for nausea, vomiting, abdominal pain and diarrhea. Negative for constipation, blood in stool and abdominal distention.  Genitourinary: Negative for urgency, frequency and difficulty urinating.  Musculoskeletal: Positive for arthralgias. Negative for neck stiffness.  Neurological: Negative for dizziness, light-headedness and numbness.  Psychiatric/Behavioral: Negative for behavioral problems, confusion and agitation.       Objective:   Physical Exam General: Well developed, well nourished, elderly woman. Appears in no apparent distress HEENT: Anicteic Sclera, No pharyngeal erythema or exudates  Neck: Supple, no JVD, no masses  Cardiovascular: RRR, S1 S2 auscultated, no rubs, murmurs or gallops.  Respiratory: Clear to auscultation bilaterally with equal chest rise  Abdomen: Tender to palpation in LLQ, + BS, no masses.; No guarding or peritoneal signs GU: Guaic result negative, normal anal tone without tenderness, no masses palpable.  Extremities: warm dry without cyanosis clubbing.  Neuro:  AAOx3, cranial nerves grossly intact.  Skin: Without rashes exudates or nodules.  Psych: Normal affect and demeanor with intact judgement and insight      Assessment & Plan:  Dysphagia, pharyngoesophageal phase - Plan: Ambulatory referral to Gastroenterology  Early satiety - Plan: CBC with Differential, Comprehensive metabolic panel, Amylase, Lipase, HgB A1c  LLQ pain - Plan: CT Abdomen Pelvis W Contrast, CBC with Differential, Comprehensive metabolic panel, Amylase, Lipase, HgB A1c  LLQ abdominal tenderness - Plan: CT Abdomen Pelvis W Contrast, CBC with Differential, Comprehensive metabolic panel, Amylase, Lipase, HgB A1c  Diarrhea - Plan: CBC with Differential, Comprehensive metabolic panel, Amylase, Lipase, HgB A1c  DM (diabetes mellitus) type 2, uncontrolled, with ketoacidosis - Plan: HgB A1c   1) Dysphagia: Scheduling EGD with possible dilatation to assess for stricturing of the esophagus.  2) LLQ abdominal pain:  Will get a CT scan of the abdomen/pelvis. 3) Diabetes: Ordering A1C.  Last A1C on 05/2013 of 7.2.  Other studies as written above next to visit diagnoses.  Maria [name] 09/27/2014  I have seen the patient with the PA student and agree with the note, some additions made.  Avanell Shackleton, MD

## 2014-09-27 NOTE — Patient Instructions (Addendum)
You have been scheduled for an endoscopy. Please follow written instructions given to you at your visit today. If you use inhalers (even only as needed), please bring them with you on the day of your procedure. Your physician has requested that you go to www.startemmi.com and enter the access code given to you at your visit today. This web site gives a general overview about your procedure. However, you should still follow specific instructions given to you by our office regarding your preparation for the procedure.  Your physician has requested that you go to the basement for the following lab work before leaving today: CBC/diff, CMET, Amylase, Lipase, Hgb A1C  You have been scheduled for a CT scan of the abdomen and pelvis at [facility] ([address]---this is in the same building as [facility]).   You are scheduled on 09/30/14 at 3:00pm. You should arrive 15 minutes prior to your appointment time for registration. Please follow the written instructions below on the day of your exam:  WARNING: IF YOU ARE ALLERGIC TO IODINE/X-RAY DYE, PLEASE NOTIFY RADIOLOGY IMMEDIATELY AT [phone]! YOU WILL BE GIVEN A 13 HOUR PREMEDICATION PREP.  1) Do not eat or drink anything after 11:00am (4 hours prior to your test) 2) You have been given 2 bottles of oral contrast to drink. The solution may taste  better if refrigerated, but do NOT add ice or any other liquid to this solution. Shake  well before drinking.    Drink 1 bottle of contrast @ 1;00pm (2 hours prior to your exam)  Drink 1 bottle of contrast @ 2:00pm (1 hour prior to your exam)  You may take any medications as prescribed with a small amount of water except for the following: Metformin, Glucophage, Glucovance, Avandamet, Riomet, Fortamet, Actoplus Met, Janumet, Glumetza or Metaglip. The above medications must be held the day of the exam AND 48 hours after the exam.  The purpose of you drinking the oral contrast is to aid  in the visualization of your intestinal tract. The contrast solution may cause some diarrhea. Before your exam is started, you will be given a small amount of fluid to drink. Depending on your individual set of symptoms, you may also receive an intravenous injection of x-ray contrast/dye. Plan on being at [facility] for 30 minutes or long, depending on the type of exam you are having performed.  This test typically takes 30-45 minutes to complete.  If you have any questions regarding your exam or if you need to reschedule, you may call the CT department at [phone] between the hours of 8:00 am and 5:00 pm, Monday-Friday.  ________________________________________________________________________  I appreciate the opportunity to care for you. Silvano Rusk, M.D., [facility]

## 2014-09-30 ENCOUNTER — Ambulatory Visit (INDEPENDENT_AMBULATORY_CARE_PROVIDER_SITE_OTHER)
Admission: RE | Admit: 2014-09-30 | Discharge: 2014-09-30 | Disposition: A | Discharge status: Home / Self Care | Payer: Medicare Other | Source: Ambulatory Visit | Attending: Internal Medicine | Admitting: Internal Medicine

## 2014-09-30 ENCOUNTER — Encounter: Payer: Self-pay | Admitting: Cardiology

## 2014-09-30 DIAGNOSIS — R1032 Left lower quadrant pain: Secondary | ICD-10-CM | POA: Diagnosis not present

## 2014-09-30 DIAGNOSIS — R10814 Left lower quadrant abdominal tenderness: Secondary | ICD-10-CM | POA: Diagnosis not present

## 2014-09-30 DIAGNOSIS — R112 Nausea with vomiting, unspecified: Secondary | ICD-10-CM | POA: Diagnosis not present

## 2014-09-30 MED ORDER — IOHEXOL 300 MG/ML  SOLN
100.0000 mL | Freq: Once | INTRAMUSCULAR | Status: AC | PRN
  Administered 2014-09-30: 100 mL via INTRAVENOUS

## 2014-10-01 ENCOUNTER — Telehealth: Payer: Self-pay | Admitting: Internal Medicine

## 2014-10-01 NOTE — Progress Notes (Signed)
Quick Note:  CT scan is ok EGD 1/21 next will see her then ______

## 2014-10-01 NOTE — Telephone Encounter (Signed)
Spoke with [name] (daughter) and confirmed date and time of upcoming procedure.  Mom said we can speak to daughter she just forgot to sign the bottom of the DPR form, she will sign it when she comes for the procedure Thursday.

## 2014-10-03 ENCOUNTER — Encounter: Payer: Self-pay | Admitting: Internal Medicine

## 2014-10-03 ENCOUNTER — Ambulatory Visit (AMBULATORY_SURGERY_CENTER): Payer: Medicare Other | Admitting: Internal Medicine

## 2014-10-03 VITALS — BP 125/70 | HR 68 | Temp 96.9°F | Resp 23 | Ht 62.0 in | Wt 152.0 lb

## 2014-10-03 DIAGNOSIS — R1310 Dysphagia, unspecified: Secondary | ICD-10-CM | POA: Diagnosis not present

## 2014-10-03 DIAGNOSIS — R1314 Dysphagia, pharyngoesophageal phase: Secondary | ICD-10-CM

## 2014-10-03 DIAGNOSIS — I10 Essential (primary) hypertension: Secondary | ICD-10-CM | POA: Diagnosis not present

## 2014-10-03 DIAGNOSIS — E119 Type 2 diabetes mellitus without complications: Secondary | ICD-10-CM | POA: Diagnosis not present

## 2014-10-03 MED ORDER — SODIUM CHLORIDE 0.9 % IV SOLN: 500.0000 mL | INTRAVENOUS | Status: DC

## 2014-10-03 NOTE — Patient Instructions (Addendum)
The esophagus, stomach and duodenum look ok. Cause of your problems not yet clear from this, labs and CT scan. Your blood sugar is not controlled well which may be part or all of the problem.  We need to discuss having a colonoscopy procedure.  I appreciate the opportunity to care for you. Gatha Mayer, MD, FACG   YOU HAD AN ENDOSCOPIC PROCEDURE TODAY AT [facility]: Refer to the procedure report that was given to you for any specific questions about what was found during the examination.  If the procedure report does not answer your questions, please call your gastroenterologist to clarify.  If you requested that your care partner not be given the details of your procedure findings, then the procedure report has been included in a sealed envelope for you to review at your convenience later.  YOU SHOULD EXPECT: Some feelings of bloating in the abdomen. Passage of more gas than usual.  Walking can help get rid of the air that was put into your GI tract during the procedure and reduce the bloating  DIET: Your first meal following the procedure should be a light meal and then it is ok to progress to your normal diet.  A half-sandwich or bowl of soup is an example of a good first meal.  Heavy or fried foods are harder to digest and may make you feel nauseous or bloated.  Likewise meals heavy in dairy and vegetables can cause extra gas to form and this can also increase the bloating.  Drink plenty of fluids but you should avoid alcoholic beverages for 24 hours.  ACTIVITY: Your care partner should take you home directly after the procedure.  You should plan to take it easy, moving slowly for the rest of the day.  You can resume normal activity the day after the procedure however you should NOT DRIVE or use heavy machinery for 24 hours (because of the sedation medicines used during the test).    SYMPTOMS TO REPORT IMMEDIATELY: A gastroenterologist can be reached at any hour.   During normal business hours, 8:30 AM to 5:00 PM Monday through Friday, call [phone].  After hours and on weekends, please call the GI answering service at [phone] who will take a message and have the physician on call contact you.   Following upper endoscopy (EGD)  Vomiting of blood or coffee ground material  New chest pain or pain under the shoulder blades  Painful or persistently difficult swallowing  New shortness of breath  Fever of 100F or higher  Black, tarry-looking stools  FOLLOW UP: Our staff will call the home number listed on your records the next business day following your procedure to check on you and address any questions or concerns that you may have at that time regarding the information given to you following your procedure. This is a courtesy call and so if there is no answer at the home number and we have not heard from you through the emergency physician on call, we will assume that you have returned to your regular daily activities without incident.  SIGNATURES/CONFIDENTIALITY: You and/or your care partner have signed paperwork which will be entered into your electronic medical record.  These signatures attest to the fact that that the information above on your After Visit Summary has been reviewed and is understood.  Full responsibility of the confidentiality of this discharge information lies with you and/or your care-partner.  You might note some irritation in your nose  or some drainage.  This may cause feels of congestion.  This is from the oxygen, which can be irritating.  There is no need for concern, this should clear up in a day or so  Dr. Carlean Purl will communicate with Dr. Alroy Dust and will call you with any further recommendations

## 2014-10-03 NOTE — Op Note (Signed)
[place]  [address] [place], [id number]   ENDOSCOPY PROCEDURE REPORT  PATIENT: Maria [name], Maria [name]  MR#: [id number] BIRTHDATE: 12-Apr-1924 , 89  yrs. old GENDER: female ENDOSCOPIST: Gatha Mayer, MD, [facility] PROCEDURE DATE:  10/03/2014 PROCEDURE:  EGD, diagnostic ASA CLASS:     Class III INDICATIONS:  dysphagia, early satiety. MEDICATIONS: Propofol 60 mg IV, Lidocaine 40 mg IV, and Monitored anesthesia care TOPICAL ANESTHETIC: none  DESCRIPTION OF PROCEDURE: After the risks benefits and alternatives of the procedure were thoroughly explained, informed consent was obtained.  The LB [id number] endoscope was introduced through the mouth and advanced to the second portion of the duodenum , Without limitations.  The instrument was slowly withdrawn as the mucosa was fully examined.      EXAM: The esophagus and gastroesophageal junction were completely normal in appearance.  The stomach was entered and closely examined.The antrum, angularis, and lesser curvature were well visualized, including a retroflexed view of the cardia and fundus. The stomach wall was normally distensable.  The scope passed easily through the pylorus into the duodenum.  Retroflexed views revealed no abnormalities.     The scope was then withdrawn from the patient and the procedure completed.  COMPLICATIONS: There were no immediate complications.  ENDOSCOPIC IMPRESSION: Normal appearing esophagus and GE junction, the stomach was well visualized and normal in appearance, normal appearing duodenum  RECOMMENDATIONS: Only abnormality so far is elevated Hgb A1 C.  other labs, CT abd/pelvis and this exam do not reveal a cause of problems. May need a colonoscopy because of abdominal pain and diarrhea.  Will discuss.  REPEAT EXAM:  eSigned:  Gatha Mayer, MD, Mar[facility] Regal 10/03/2014 3:03 PM    CC:O. Doreene Burke, MD and The Patient

## 2014-10-03 NOTE — Progress Notes (Signed)
Pt has frequent, productive cough while in the RR.  HOB up and coughing does decrease while in the RR

## 2014-10-03 NOTE — Progress Notes (Signed)
Stable to RR 

## 2014-10-07 ENCOUNTER — Telehealth: Payer: Self-pay | Admitting: *Deleted

## 2014-10-07 NOTE — Telephone Encounter (Signed)
  Follow up Call-  Call back number 10/03/2014  Post procedure Call Back phone  # [phone]  Permission to leave phone message Yes     Patient questions:  Do you have a fever, pain , or abdominal swelling? No. Pain Score  0 *  Have you tolerated food without any problems? Yes.    Have you been able to return to your normal activities? Yes.    Do you have any questions about your discharge instructions: Diet   No. Medications  No. Follow up visit  No.  Do you have questions or concerns about your Care? No.  Actions: * If pain score is 4 or above: No action needed, pain <4.  Information provided via daughter,daughter stated she had energy she came home and stated folding clothes and cleaning.

## 2014-10-14 ENCOUNTER — Encounter: Payer: Self-pay | Admitting: Internal Medicine

## 2014-10-16 DIAGNOSIS — E119 Type 2 diabetes mellitus without complications: Secondary | ICD-10-CM | POA: Diagnosis not present

## 2014-10-16 DIAGNOSIS — I10 Essential (primary) hypertension: Secondary | ICD-10-CM | POA: Diagnosis not present

## 2014-10-16 DIAGNOSIS — M150 Primary generalized (osteo)arthritis: Secondary | ICD-10-CM | POA: Diagnosis not present

## 2014-10-16 DIAGNOSIS — J069 Acute upper respiratory infection, unspecified: Secondary | ICD-10-CM | POA: Diagnosis not present

## 2014-11-06 ENCOUNTER — Other Ambulatory Visit: Payer: Self-pay

## 2014-11-06 DIAGNOSIS — Z1231 Encounter for screening mammogram for malignant neoplasm of breast: Secondary | ICD-10-CM

## 2014-11-28 ENCOUNTER — Ambulatory Visit
Admission: RE | Admit: 2014-11-28 | Discharge: 2014-11-28 | Disposition: A | Discharge status: Home / Self Care | Payer: Medicare Other | Source: Ambulatory Visit

## 2014-11-28 DIAGNOSIS — Z1231 Encounter for screening mammogram for malignant neoplasm of breast: Secondary | ICD-10-CM | POA: Diagnosis not present

## 2014-12-10 DIAGNOSIS — Z9849 Cataract extraction status, unspecified eye: Secondary | ICD-10-CM | POA: Diagnosis not present

## 2014-12-10 DIAGNOSIS — H524 Presbyopia: Secondary | ICD-10-CM | POA: Diagnosis not present

## 2014-12-10 DIAGNOSIS — H18413 Arcus senilis, bilateral: Secondary | ICD-10-CM | POA: Diagnosis not present

## 2014-12-10 DIAGNOSIS — H11153 Pinguecula, bilateral: Secondary | ICD-10-CM | POA: Diagnosis not present

## 2014-12-10 DIAGNOSIS — D3132 Benign neoplasm of left choroid: Secondary | ICD-10-CM | POA: Diagnosis not present

## 2014-12-10 DIAGNOSIS — E119 Type 2 diabetes mellitus without complications: Secondary | ICD-10-CM | POA: Diagnosis not present

## 2014-12-10 DIAGNOSIS — H5201 Hypermetropia, right eye: Secondary | ICD-10-CM | POA: Diagnosis not present

## 2014-12-10 DIAGNOSIS — H33322 Round hole, left eye: Secondary | ICD-10-CM | POA: Diagnosis not present

## 2014-12-10 DIAGNOSIS — H52223 Regular astigmatism, bilateral: Secondary | ICD-10-CM | POA: Diagnosis not present

## 2014-12-23 ENCOUNTER — Telehealth: Payer: Self-pay | Admitting: Cardiology

## 2014-12-23 ENCOUNTER — Ambulatory Visit (INDEPENDENT_AMBULATORY_CARE_PROVIDER_SITE_OTHER): Payer: Medicare Other | Admitting: *Deleted

## 2014-12-23 DIAGNOSIS — I442 Atrioventricular block, complete: Secondary | ICD-10-CM

## 2014-12-23 LAB — MDC_IDC_ENUM_SESS_TYPE_REMOTE
Battery Impedance: 1010 Ohm
Battery Voltage: 2.78 V
Brady Statistic AP VP Percent: 14 %
Brady Statistic AP VS Percent: 0 %
Brady Statistic AS VS Percent: 0 %
Lead Channel Impedance Value: 704 Ohm
Lead Channel Pacing Threshold Amplitude: 0.375 V
Lead Channel Sensing Intrinsic Amplitude: 2.8 mV
Lead Channel Setting Pacing Amplitude: 2.5 V
Lead Channel Setting Pacing Pulse Width: 0.4 ms
Lead Channel Setting Sensing Sensitivity: 2.8 mV
MDC IDC MSMT BATTERY REMAINING LONGEVITY: 54 mo
MDC IDC MSMT LEADCHNL RA PACING THRESHOLD PULSEWIDTH: 0.4 ms
MDC IDC MSMT LEADCHNL RV IMPEDANCE VALUE: 744 Ohm
MDC IDC MSMT LEADCHNL RV PACING THRESHOLD AMPLITUDE: 0.5 V
MDC IDC MSMT LEADCHNL RV PACING THRESHOLD PULSEWIDTH: 0.4 ms
MDC IDC SESS DTM: 20160411171732
MDC IDC SET LEADCHNL RA PACING AMPLITUDE: 2 V
MDC IDC STAT BRADY AS VP PERCENT: 85 %

## 2014-12-23 NOTE — Telephone Encounter (Signed)
Spoke with pt and reminded pt of remote transmission that is due today. Pt verbalized understanding.   

## 2014-12-25 ENCOUNTER — Encounter (INDEPENDENT_AMBULATORY_CARE_PROVIDER_SITE_OTHER): Payer: Self-pay | Admitting: Ophthalmology

## 2014-12-26 NOTE — Progress Notes (Signed)
Remote pacemaker transmission.   

## 2015-01-07 DIAGNOSIS — N3000 Acute cystitis without hematuria: Secondary | ICD-10-CM | POA: Diagnosis not present

## 2015-01-07 DIAGNOSIS — E119 Type 2 diabetes mellitus without complications: Secondary | ICD-10-CM | POA: Diagnosis not present

## 2015-01-07 DIAGNOSIS — I10 Essential (primary) hypertension: Secondary | ICD-10-CM | POA: Diagnosis not present

## 2015-01-07 DIAGNOSIS — B009 Herpesviral infection, unspecified: Secondary | ICD-10-CM | POA: Diagnosis not present

## 2015-01-07 DIAGNOSIS — M150 Primary generalized (osteo)arthritis: Secondary | ICD-10-CM | POA: Diagnosis not present

## 2015-01-07 DIAGNOSIS — R3915 Urgency of urination: Secondary | ICD-10-CM | POA: Diagnosis not present

## 2015-01-09 ENCOUNTER — Encounter (INDEPENDENT_AMBULATORY_CARE_PROVIDER_SITE_OTHER): Payer: Medicare Other | Admitting: Ophthalmology

## 2015-01-09 DIAGNOSIS — H35033 Hypertensive retinopathy, bilateral: Secondary | ICD-10-CM

## 2015-01-09 DIAGNOSIS — H43813 Vitreous degeneration, bilateral: Secondary | ICD-10-CM | POA: Diagnosis not present

## 2015-01-09 DIAGNOSIS — I10 Essential (primary) hypertension: Secondary | ICD-10-CM

## 2015-01-09 DIAGNOSIS — H31001 Unspecified chorioretinal scars, right eye: Secondary | ICD-10-CM | POA: Diagnosis not present

## 2015-01-09 DIAGNOSIS — H33302 Unspecified retinal break, left eye: Secondary | ICD-10-CM

## 2015-01-10 ENCOUNTER — Encounter: Payer: Self-pay | Admitting: Cardiology

## 2015-01-17 ENCOUNTER — Encounter: Payer: Self-pay | Admitting: Internal Medicine

## 2015-01-23 ENCOUNTER — Ambulatory Visit (INDEPENDENT_AMBULATORY_CARE_PROVIDER_SITE_OTHER): Payer: Medicare Other | Admitting: Ophthalmology

## 2015-01-23 DIAGNOSIS — H33302 Unspecified retinal break, left eye: Secondary | ICD-10-CM

## 2015-02-11 DIAGNOSIS — H0011 Chalazion right upper eyelid: Secondary | ICD-10-CM | POA: Diagnosis not present

## 2015-02-11 DIAGNOSIS — H04123 Dry eye syndrome of bilateral lacrimal glands: Secondary | ICD-10-CM | POA: Diagnosis not present

## 2015-03-11 DIAGNOSIS — H18413 Arcus senilis, bilateral: Secondary | ICD-10-CM | POA: Diagnosis not present

## 2015-03-11 DIAGNOSIS — H00011 Hordeolum externum right upper eyelid: Secondary | ICD-10-CM | POA: Diagnosis not present

## 2015-03-11 DIAGNOSIS — Z9849 Cataract extraction status, unspecified eye: Secondary | ICD-10-CM | POA: Diagnosis not present

## 2015-03-11 DIAGNOSIS — H11153 Pinguecula, bilateral: Secondary | ICD-10-CM | POA: Diagnosis not present

## 2015-03-11 DIAGNOSIS — H04123 Dry eye syndrome of bilateral lacrimal glands: Secondary | ICD-10-CM | POA: Diagnosis not present

## 2015-03-25 ENCOUNTER — Ambulatory Visit (INDEPENDENT_AMBULATORY_CARE_PROVIDER_SITE_OTHER): Payer: Medicare Other | Admitting: *Deleted

## 2015-03-25 ENCOUNTER — Telehealth: Payer: Self-pay | Admitting: Cardiology

## 2015-03-25 DIAGNOSIS — I442 Atrioventricular block, complete: Secondary | ICD-10-CM | POA: Diagnosis not present

## 2015-03-25 NOTE — Progress Notes (Signed)
Remote pacemaker transmission.   

## 2015-03-25 NOTE — Telephone Encounter (Signed)
LMOVM reminding pt to send remote transmission.   

## 2015-03-28 LAB — CUP PACEART REMOTE DEVICE CHECK
Battery Remaining Longevity: 48 mo
Battery Voltage: 2.77 V
Brady Statistic AP VP Percent: 15 %
Brady Statistic AP VS Percent: 0 %
Brady Statistic AS VP Percent: 85 %
Brady Statistic AS VS Percent: 1 %
Lead Channel Impedance Value: 647 Ohm
Lead Channel Impedance Value: 668 Ohm
Lead Channel Pacing Threshold Amplitude: 0.375 V
Lead Channel Pacing Threshold Amplitude: 0.5 V
Lead Channel Pacing Threshold Pulse Width: 0.4 ms
Lead Channel Sensing Intrinsic Amplitude: 2.8 mV
Lead Channel Setting Pacing Pulse Width: 0.4 ms
Lead Channel Setting Sensing Sensitivity: 2.8 mV
MDC IDC MSMT BATTERY IMPEDANCE: 1168 Ohm
MDC IDC MSMT LEADCHNL RV PACING THRESHOLD PULSEWIDTH: 0.4 ms
MDC IDC SESS DTM: 20160712161425
MDC IDC SET LEADCHNL RA PACING AMPLITUDE: 2 V
MDC IDC SET LEADCHNL RV PACING AMPLITUDE: 2.5 V

## 2015-04-09 ENCOUNTER — Encounter: Payer: Self-pay | Admitting: *Deleted

## 2015-04-14 ENCOUNTER — Encounter: Payer: Self-pay | Admitting: Internal Medicine

## 2015-06-05 ENCOUNTER — Ambulatory Visit (INDEPENDENT_AMBULATORY_CARE_PROVIDER_SITE_OTHER): Payer: Medicare Other | Admitting: Ophthalmology

## 2015-06-05 DIAGNOSIS — H33302 Unspecified retinal break, left eye: Secondary | ICD-10-CM

## 2015-06-05 DIAGNOSIS — H35033 Hypertensive retinopathy, bilateral: Secondary | ICD-10-CM

## 2015-06-05 DIAGNOSIS — H43813 Vitreous degeneration, bilateral: Secondary | ICD-10-CM

## 2015-06-05 DIAGNOSIS — I10 Essential (primary) hypertension: Secondary | ICD-10-CM

## 2015-06-12 ENCOUNTER — Encounter: Payer: Self-pay | Admitting: Internal Medicine

## 2015-06-12 ENCOUNTER — Ambulatory Visit (INDEPENDENT_AMBULATORY_CARE_PROVIDER_SITE_OTHER): Payer: Medicare Other | Admitting: Internal Medicine

## 2015-06-12 VITALS — BP 120/62 | HR 78 | Ht 62.0 in | Wt 150.2 lb

## 2015-06-12 DIAGNOSIS — I10 Essential (primary) hypertension: Secondary | ICD-10-CM | POA: Diagnosis not present

## 2015-06-12 DIAGNOSIS — I442 Atrioventricular block, complete: Secondary | ICD-10-CM | POA: Diagnosis not present

## 2015-06-12 DIAGNOSIS — R0789 Other chest pain: Secondary | ICD-10-CM

## 2015-06-12 DIAGNOSIS — Z950 Presence of cardiac pacemaker: Secondary | ICD-10-CM

## 2015-06-12 LAB — CUP PACEART INCLINIC DEVICE CHECK
Battery Impedance: 1306 Ohm
Battery Remaining Longevity: 45 mo
Battery Voltage: 2.77 V
Brady Statistic AP VP Percent: 16 %
Brady Statistic AP VS Percent: 0 %
Brady Statistic AS VP Percent: 84 %
Brady Statistic AS VS Percent: 0 %
Date Time Interrogation Session: 20160929173905
Lead Channel Impedance Value: 657 Ohm
Lead Channel Impedance Value: 711 Ohm
Lead Channel Pacing Threshold Amplitude: 0.5 V
Lead Channel Pacing Threshold Amplitude: 0.5 V
Lead Channel Pacing Threshold Pulse Width: 0.4 ms
Lead Channel Pacing Threshold Pulse Width: 0.4 ms
Lead Channel Sensing Intrinsic Amplitude: 2.8 mV
Lead Channel Setting Pacing Amplitude: 2 V
Lead Channel Setting Pacing Amplitude: 2.5 V
Lead Channel Setting Pacing Pulse Width: 0.4 ms
Lead Channel Setting Sensing Sensitivity: 2.8 mV

## 2015-06-12 NOTE — Patient Instructions (Addendum)
Medication Instructions: - no changes  Labwork: - none  Procedures/Testing: - none  Follow-Up: - Remote monitoring is used to monitor your Pacemaker of ICD from home. This monitoring reduces the number of office visits required to check your device to one time per year. It allows [place] to keep an eye on the functioning of your device to ensure it is working properly. You are scheduled for a device check from home on 09/11/15. You may send your transmission at any time that day. If you have a wireless device, the transmission will be sent automatically. After your physician reviews your transmission, you will receive a postcard with your next transmission date.  - Your physician wants you to follow-up in: 1 year with Dr. Lovena Le. You will receive a reminder letter in the mail two months in advance. If you don't receive a letter, please call our office to schedule the follow-up appointment.  Any Additional Special Instructions Will Be Listed Below (If Applicable). - none

## 2015-06-12 NOTE — Assessment & Plan Note (Signed)
Her blood pressure is well controlled. Will follow.  

## 2015-06-12 NOTE — Assessment & Plan Note (Signed)
Her medtronic DDD PM is working normally. Will recheck in several months. 

## 2015-06-12 NOTE — Assessment & Plan Note (Signed)
She denies today. Will follow.

## 2015-06-12 NOTE — Progress Notes (Signed)
HPI [name] returns for ongoing PPM management. She is a very pleasant 81 year old woman who looks much younger than her stated age, with a history of hypertension and dyslipidemia, status post permanent pacemaker insertion due to complete heart block.  She has not had syncope. She does have occasional pain in her hands. She denies shortness of breath. She has occasional dizzy spells. No peripheral edema has been complained of. She does note occasional cramps in her lower extremities. She is bothered by arthritis. No Known Allergies   Current Outpatient Prescriptions  Medication Sig Dispense Refill  . aspirin EC 81 MG tablet Take 81 mg by mouth daily.    . Blood Glucose Monitoring Suppl (FREESTYLE LITE) DEVI   0  . diclofenac (VOLTAREN) 75 MG EC tablet Take 1 tablet by mouth 2 (two) times daily as needed.    Marland Kitchen FREESTYLE LITE test strip   4  . Lancets (FREESTYLE) lancets   4  . losartan-hydrochlorothiazide (HYZAAR) 50-12.5 MG per tablet Take 1 tablet by mouth daily.    . metFORMIN (GLUCOPHAGE) 500 MG tablet Take 1 tablet by mouth 2 (two) times daily.    Marland Kitchen OVER THE COUNTER MEDICATION Place 2 drops into both eyes daily as needed (for dry eyes).    . potassium chloride SA (K-DUR,KLOR-CON) 20 MEQ tablet Take 20 mEq by mouth daily.    . meloxicam (MOBIC) 15 MG tablet Take 15 mg by mouth daily.  12   No current facility-administered medications for this visit.     Past Medical History  Diagnosis Date  . Hypertension   . Type II or unspecified type diabetes mellitus without mention of complication, not stated as uncontrolled   . DM type 2 (diabetes mellitus, type 2)   . Cardiac pacemaker in situ   . Dyslipidemia   . Chest pain   . Arthritis   . Anxiety     ROS:   All systems reviewed and negative except as noted in the HPI.   Past Surgical History  Procedure Laterality Date  . Pacemaker insertion      Medtronic, [facility] 2003  . Total knee arthroplasty Bilateral       Family History  Problem Relation Age of Onset  . Colon cancer Neg Hx      Social History   Social History  . Marital Status: Widowed    Spouse Name: N/A  . Number of Children: 2  . Years of Education: N/A   Occupational History  . Not on file.   Social History Main Topics  . Smoking status: Never Smoker   . Smokeless tobacco: Current User    Types: Snuff  . Alcohol Use: 0.6 oz/week    1 Cans of beer per week  . Drug Use: No  . Sexual Activity: Not on file   Other Topics Concern  . Not on file   Social History Narrative     BP 120/62 mmHg  Pulse 78  Ht 5\' 2"  (1.575 m)  Wt 150 lb 3.2 oz (68.13 kg)  BMI 27.46 kg/m2  SpO2 96%  Physical Exam:  Well appearing 81 year old woman, NAD HEENT: Unremarkable Neck:  6 cm JVD, no thyromegally Back:  No CVA tenderness Lungs:  Clear with no wheezes, rales, or rhonchi. well-healed pacemaker incision. HEART:  Regular rate rhythm, no murmurs, no rubs, no clicks Abd:  soft, positive bowel sounds, no organomegally, no rebound, no guarding Ext:  2 plus pulses, no edema, no cyanosis, no clubbing Skin:  No rashes no nodules Neuro:  CN II through XII intact, motor grossly intact   DEVICE  Normal device function.  See PaceArt for details.   Assess/Plan:

## 2015-07-22 DIAGNOSIS — I10 Essential (primary) hypertension: Secondary | ICD-10-CM | POA: Diagnosis not present

## 2015-07-22 DIAGNOSIS — B009 Herpesviral infection, unspecified: Secondary | ICD-10-CM | POA: Diagnosis not present

## 2015-07-22 DIAGNOSIS — M150 Primary generalized (osteo)arthritis: Secondary | ICD-10-CM | POA: Diagnosis not present

## 2015-07-22 DIAGNOSIS — Z23 Encounter for immunization: Secondary | ICD-10-CM | POA: Diagnosis not present

## 2015-07-22 DIAGNOSIS — E119 Type 2 diabetes mellitus without complications: Secondary | ICD-10-CM | POA: Diagnosis not present

## 2015-09-11 ENCOUNTER — Telehealth: Payer: Self-pay | Admitting: Cardiology

## 2015-09-11 ENCOUNTER — Encounter: Payer: Medicare Other | Admitting: *Deleted

## 2015-09-11 NOTE — Telephone Encounter (Signed)
LMOVM reminding pt to send remote transmission.   

## 2015-09-12 ENCOUNTER — Encounter: Payer: Self-pay | Admitting: Cardiology

## 2015-09-19 ENCOUNTER — Ambulatory Visit (INDEPENDENT_AMBULATORY_CARE_PROVIDER_SITE_OTHER): Payer: Medicare Other | Admitting: *Deleted

## 2015-09-19 DIAGNOSIS — I442 Atrioventricular block, complete: Secondary | ICD-10-CM

## 2015-09-23 DIAGNOSIS — M79674 Pain in right toe(s): Secondary | ICD-10-CM | POA: Diagnosis not present

## 2015-09-23 DIAGNOSIS — M150 Primary generalized (osteo)arthritis: Secondary | ICD-10-CM | POA: Diagnosis not present

## 2015-09-23 NOTE — Progress Notes (Signed)
Remote pacemaker transmission.   

## 2015-10-02 LAB — CUP PACEART REMOTE DEVICE CHECK
Battery Impedance: 1415 Ohm
Battery Remaining Longevity: 42 mo
Battery Voltage: 2.77 V
Brady Statistic AP VP Percent: 15 %
Brady Statistic AP VS Percent: 0 %
Brady Statistic AS VP Percent: 85 %
Implantable Lead Implant Date: 20030311
Implantable Lead Location: 753859
Implantable Lead Model: 4092
Lead Channel Impedance Value: 623 Ohm
Lead Channel Setting Pacing Amplitude: 2 V
Lead Channel Setting Pacing Pulse Width: 0.4 ms
MDC IDC LEAD IMPLANT DT: 20030311
MDC IDC LEAD LOCATION: 753860
MDC IDC LEAD MODEL: 4592
MDC IDC MSMT LEADCHNL RA IMPEDANCE VALUE: 678 Ohm
MDC IDC SESS DTM: 20170106163858
MDC IDC SET LEADCHNL RV PACING AMPLITUDE: 2.5 V
MDC IDC SET LEADCHNL RV SENSING SENSITIVITY: 2.8 mV
MDC IDC STAT BRADY AS VS PERCENT: 0 %

## 2015-10-10 ENCOUNTER — Encounter: Payer: Self-pay | Admitting: Cardiology

## 2015-10-28 ENCOUNTER — Other Ambulatory Visit: Payer: Self-pay

## 2015-10-28 DIAGNOSIS — Z853 Personal history of malignant neoplasm of breast: Secondary | ICD-10-CM

## 2015-10-28 DIAGNOSIS — Z1231 Encounter for screening mammogram for malignant neoplasm of breast: Secondary | ICD-10-CM

## 2015-11-20 ENCOUNTER — Emergency Department (HOSPITAL_COMMUNITY): Payer: Medicare Other

## 2015-11-20 ENCOUNTER — Emergency Department (HOSPITAL_COMMUNITY)
Admission: EM | Admit: 2015-11-20 | Discharge: 2015-11-20 | Disposition: A | Discharge status: Home / Self Care | Payer: Medicare Other | Attending: Emergency Medicine | Admitting: Emergency Medicine

## 2015-11-20 ENCOUNTER — Encounter (HOSPITAL_COMMUNITY): Payer: Self-pay | Admitting: *Deleted

## 2015-11-20 DIAGNOSIS — R1084 Generalized abdominal pain: Secondary | ICD-10-CM | POA: Diagnosis not present

## 2015-11-20 DIAGNOSIS — I10 Essential (primary) hypertension: Secondary | ICD-10-CM | POA: Diagnosis not present

## 2015-11-20 DIAGNOSIS — R079 Chest pain, unspecified: Secondary | ICD-10-CM | POA: Diagnosis not present

## 2015-11-20 DIAGNOSIS — M549 Dorsalgia, unspecified: Secondary | ICD-10-CM

## 2015-11-20 DIAGNOSIS — Z7984 Long term (current) use of oral hypoglycemic drugs: Secondary | ICD-10-CM | POA: Insufficient documentation

## 2015-11-20 DIAGNOSIS — E119 Type 2 diabetes mellitus without complications: Secondary | ICD-10-CM | POA: Insufficient documentation

## 2015-11-20 DIAGNOSIS — R072 Precordial pain: Secondary | ICD-10-CM | POA: Diagnosis not present

## 2015-11-20 DIAGNOSIS — Z853 Personal history of malignant neoplasm of breast: Secondary | ICD-10-CM | POA: Insufficient documentation

## 2015-11-20 DIAGNOSIS — M545 Low back pain: Secondary | ICD-10-CM | POA: Insufficient documentation

## 2015-11-20 DIAGNOSIS — R1030 Lower abdominal pain, unspecified: Secondary | ICD-10-CM | POA: Diagnosis not present

## 2015-11-20 DIAGNOSIS — Z7982 Long term (current) use of aspirin: Secondary | ICD-10-CM | POA: Insufficient documentation

## 2015-11-20 DIAGNOSIS — R0602 Shortness of breath: Secondary | ICD-10-CM | POA: Insufficient documentation

## 2015-11-20 DIAGNOSIS — R109 Unspecified abdominal pain: Secondary | ICD-10-CM | POA: Insufficient documentation

## 2015-11-20 DIAGNOSIS — R1110 Vomiting, unspecified: Secondary | ICD-10-CM | POA: Diagnosis not present

## 2015-11-20 HISTORY — DX: Type 2 diabetes mellitus without complications: E11.9

## 2015-11-20 HISTORY — DX: Malignant (primary) neoplasm, unspecified: C80.1

## 2015-11-20 LAB — BASIC METABOLIC PANEL
Anion gap: 15 (ref 5–15)
BUN: 23 mg/dL — AB (ref 6–20)
CHLORIDE: 100 mmol/L — AB (ref 101–111)
CO2: 24 mmol/L (ref 22–32)
CREATININE: 0.84 mg/dL (ref 0.44–1.00)
Calcium: 10.3 mg/dL (ref 8.9–10.3)
GFR calc Af Amer: >60 mL/min (ref >60)
GFR calc non Af Amer: 60 mL/min — ABNORMAL LOW (ref >60)
Glucose, Bld: 166 mg/dL — ABNORMAL HIGH (ref 65–99)
Potassium: 4 mmol/L (ref 3.5–5.1)
SODIUM: 139 mmol/L (ref 135–145)

## 2015-11-20 LAB — URINALYSIS, ROUTINE W REFLEX MICROSCOPIC
Bilirubin Urine: NEGATIVE
GLUCOSE, UA: NEGATIVE mg/dL
HGB URINE DIPSTICK: NEGATIVE
KETONES UR: NEGATIVE mg/dL
Leukocytes, UA: NEGATIVE
Nitrite: NEGATIVE
PROTEIN: NEGATIVE mg/dL
Specific Gravity, Urine: 1.009 (ref 1.005–1.030)
pH: 5.5 (ref 5.0–8.0)

## 2015-11-20 LAB — CBC
HCT: 40.9 % (ref 36.0–46.0)
Hemoglobin: 13.6 g/dL (ref 12.0–15.0)
MCH: 28.3 pg (ref 26.0–34.0)
MCHC: 33.3 g/dL (ref 30.0–36.0)
MCV: 85 fL (ref 78.0–100.0)
PLATELETS: 245 10*3/uL (ref 150–400)
RBC: 4.81 MIL/uL (ref 3.87–5.11)
RDW: 13.6 % (ref 11.5–15.5)
WBC: 5.5 10*3/uL (ref 4.0–10.5)

## 2015-11-20 LAB — I-STAT TROPONIN, ED: Troponin i, poc: 0.01 ng/mL (ref 0.00–0.08)

## 2015-11-20 LAB — TROPONIN I: Troponin I: <0.03 ng/mL (ref <0.031)

## 2015-11-20 LAB — HEPATIC FUNCTION PANEL
ALK PHOS: 37 U/L — AB (ref 38–126)
ALT: 13 U/L — ABNORMAL LOW (ref 14–54)
AST: 26 U/L (ref 15–41)
Albumin: 4.1 g/dL (ref 3.5–5.0)
BILIRUBIN INDIRECT: 0.6 mg/dL (ref 0.3–0.9)
BILIRUBIN TOTAL: 0.9 mg/dL (ref 0.3–1.2)
Bilirubin, Direct: 0.3 mg/dL (ref 0.1–0.5)
Total Protein: 7.6 g/dL (ref 6.5–8.1)

## 2015-11-20 LAB — LIPASE, BLOOD: LIPASE: 20 U/L (ref 11–51)

## 2015-11-20 MED ORDER — IOHEXOL 350 MG/ML SOLN
100.0000 mL | Freq: Once | INTRAVENOUS | Status: AC | PRN
  Administered 2015-11-20: 100 mL via INTRAVENOUS

## 2015-11-20 NOTE — ED Notes (Signed)
Pt c/o bil chest radiating into R mid back, pt reports chronic SOB, pt c/o  Nausea & bil lower abd pain onset today @ 8am, pt A&O x4, follows commands, speaks in complete sentences, pt has [employer] pacemaker

## 2015-11-20 NOTE — Discharge Instructions (Signed)

## 2015-11-20 NOTE — ED Notes (Signed)
Patient transported to CT 

## 2015-11-20 NOTE — ED Provider Notes (Signed)
CSN: [id number]     Arrival date & time 11/20/15  1135 History   First MD Initiated Contact with Patient 11/20/15 1914     Chief Complaint  Patient presents with  . Chest Pain    Patient is a 82 y.o. female presenting with chest pain. The history is provided by the patient and a relative.  Chest Pain Pain location:  Substernal area Pain quality: sharp   Radiates to: abdomen. Pain severity:  Moderate Onset quality:  Sudden Duration:  12 hours Timing:  Intermittent Progression:  Improving Chronicity:  New Relieved by:  None tried Worsened by:  Nothing tried Associated symptoms: abdominal pain, back pain, shortness of breath and vomiting   Associated symptoms: no fever, no headache and no weakness   Risk factors: no coronary artery disease and no smoking   Patient with h/o HTN/diabetes reports that she woke up with chest pain that was sharp and severe She reports associated SOB She reports it then progressed to her abdomen and also had some back pain Her CP is improved but still having diffuse ABD pain She has never had this before   She denies h/o CAD/CVA  She was feeling well prior to going to bed last night Past Medical History  Diagnosis Date  . Diabetes mellitus without complication ([place])   . Hypertension   . Cancer [facility])     breast 2015   Past Surgical History  Procedure Laterality Date  . Knee surgery Bilateral   . Pacemaker insertion    . Cataract extraction     No family history on file. Social History  Substance Use Topics  . Smoking status: Never Smoker   . Smokeless tobacco: None  . Alcohol Use: No   OB History    No data available     Review of Systems  Constitutional: Negative for fever.  Respiratory: Positive for shortness of breath.   Cardiovascular: Positive for chest pain.  Gastrointestinal: Positive for vomiting and abdominal pain.  Musculoskeletal: Positive for back pain.  Neurological: Negative for syncope, weakness and headaches.  All  other systems reviewed and are negative.     Allergies  Review of patient's allergies indicates no known allergies.  Home Medications   Prior to Admission medications   Medication Sig Start Date End Date Taking? Authorizing Provider  aspirin EC 81 MG tablet Take 81 mg by mouth daily.   Yes Historical Provider, MD  Calcium-Magnesium-Vitamin D K2505718 MG-MG-UNIT TABS Take 1 tablet by mouth daily.   Yes Historical Provider, MD  losartan-hydrochlorothiazide (HYZAAR) 50-12.5 MG tablet Take 1 tablet by mouth daily. 11/17/15  Yes Historical Provider, MD  metFORMIN (GLUCOPHAGE) 500 MG tablet Take 500 mg by mouth 2 (two) times daily. 11/14/15  Yes Historical Provider, MD  nabumetone (RELAFEN) 500 MG tablet Take 500 mg by mouth 2 (two) times daily as needed for moderate pain. Reported on 11/20/2015 10/27/15  Yes Historical Provider, MD  Polyvinyl Alcohol-Povidone (REFRESH OP) Place 1 drop into both eyes 2 (two) times daily as needed (for dry eyes).   Yes Historical Provider, MD  valACYclovir (VALTREX) 1000 MG tablet Take 1 g by mouth every 12 (twelve) hours as needed. For outbreaks 10/27/15  Yes Historical Provider, MD   BP 156/77 mmHg  Pulse 73  Temp(Src) 98.3 F (36.8 C) (Oral)  Resp 19  Ht 5\' 2"  (1.575 m)  Wt 63.22 kg  BMI 25.49 kg/m2  SpO2 100% Physical Exam CONSTITUTIONAL: Elderly, uncomfortable appearing HEAD: Normocephalic/atraumatic EYES: EOMI/PERRL ENMT: Mucous  membranes moist NECK: supple no meningeal signs SPINE/BACK:entire spine nontender CV: S1/S2 noted, no murmurs/rubs/gallops noted LUNGS: Lungs are clear to auscultation bilaterally, no apparent distress ABDOMEN: soft,diffuse abd tenderness GU:no cva tenderness NEURO: Pt is awake/alert/appropriate, moves all extremitiesx4.  No facial droop.   EXTREMITIES: pulses normal/equal, full ROM SKIN: warm, color normal PSYCH: no abnormalities of mood noted, alert and oriented to situation  ED Course  Procedures  9:17 PM When I  walked into room, pt had significant HTN and was reporting CP/abd pain I am concerned given history/exam for acute aortic dissection CT imaging ordered Pt declined pain meds 10:18 PM Pt improved CT imaging negative for acute dissection No acute abdominal findings She is now well appearing I doubt ACS at this time Will d/c home Pt prefers to go home Discussed strict return precautions with patient/family  Labs Review Labs Reviewed  BASIC METABOLIC PANEL - Abnormal; Notable for the following:    Chloride 100 (*)    Glucose, Bld 166 (*)    BUN 23 (*)    GFR calc non Af Amer 60 (*)    All other components within normal limits  HEPATIC FUNCTION PANEL - Abnormal; Notable for the following:    ALT 13 (*)    Alkaline Phosphatase 37 (*)    All other components within normal limits  URINALYSIS, ROUTINE W REFLEX MICROSCOPIC (NOT AT [facility]) - Abnormal; Notable for the following:    APPearance CLOUDY (*)    All other components within normal limits  CBC  LIPASE, BLOOD  TROPONIN I  I-STAT TROPOININ, ED    Imaging Review Dg Chest 2 View  11/20/2015  CLINICAL DATA:  Chest pain for several hours, chronic shortness of Breath EXAM: CHEST  2 VIEW COMPARISON:  None. FINDINGS: Elevation of the right hemidiaphragm is noted. A pacing device is seen. The cardiac shadow is at the upper limits of normal in size. The lungs are clear bilaterally. No focal infiltrate is seen. Degenerative changes of the thoracic spine and shoulder joints are seen. IMPRESSION: No acute abnormality noted. Electronically Signed   By: Inez Catalina M.D.   On: 11/20/2015 12:45   Ct Angio Chest Aorta W/cm &/or Wo/cm  11/20/2015  CLINICAL DATA:  82 year old female with bilateral chest pain radiating to the back. Shortness of breath and nausea. Bilateral lower abdominal pain. EXAM: CT ANGIOGRAPHY CHEST, ABDOMEN AND PELVIS TECHNIQUE: Multidetector CT imaging through the chest, abdomen and pelvis was performed using the standard protocol  during bolus administration of intravenous contrast. Multiplanar reconstructed images and MIPs were obtained and reviewed to evaluate the vascular anatomy. CONTRAST:  175mL OMNIPAQUE IOHEXOL 350 MG/ML SOLN COMPARISON:  Chest radiographs earlier this day FINDINGS: CTA CHEST FINDINGS Normal caliber thoracic aorta without dissection, aneurysm, hematoma or acute aortic syndrome. Mild atherosclerosis of the arch and descending aorta. Common origin of the brachiocephalic and left common carotid artery, normal variant. No filling defects in the pulmonary arteries to the segmental level. Mild cardiomegaly. Coronary artery calcification versus stent. Left-sided pacemaker in place. No pericardial effusion. Small mediastinal lymph nodes, not enlarged by size criteria. No hilar adenopathy. Enlarged heterogeneous left lobe of the thyroid consistent with goiter. Scattered atelectasis in both lungs. No pneumonia. No pulmonary edema. No pulmonary mass or suspicious nodule. Mild elevation of right hemidiaphragm. No pleural effusion. There are no acute or suspicious osseous abnormalities. Age related degenerative change in the thoracic spine. Advanced degenerative change of both shoulders with intra-articular bodies. Review of the MIP images confirms the  above findings. CTA ABDOMEN AND PELVIS FINDINGS Normal caliber abdominal aorta without aneurysm, dissection, acute aortic abnormality or hematoma. Moderate atherosclerosis. Atherosclerosis at the origin of the celiac artery which remains patent. Superior and inferior mesenteric arteries are patent. Single bilateral renal arteries are patent. Arterial phase imaging of the liver, gallbladder, spleen, pancreas, and adrenal glands are normal. Symmetric renal enhancement without hydronephrosis. Small hiatal hernia. Stomach physiologically distended with ingested contents. There are no dilated or thickened bowel loops. Appendix is normal. Moderate stool burden. Colonic diverticulosis  throughout without diverticulitis. No free air, free fluid, or intra-abdominal fluid collection. In the pelvis there are calcified uterine fibroids. Ovaries are quiescent, normal for age. Bladder distended without wall thickening. No pelvic free fluid or adnexal mass. No intra-abdominal adenopathy. Multilevel degenerative change in the lumbar spine, most prominent at L4-L5. There are no acute or suspicious osseous abnormalities. Review of the MIP images confirms the above findings. IMPRESSION: 1. Normal caliber thoracoabdominal aorta without acute aortic abnormality. Moderate atherosclerosis. 2. No acute abnormality in the chest abdomen and pelvis. 3. Incidental findings as described. Electronically Signed   By: Jeb Levering M.D.   On: 11/20/2015 21:59   Ct Angio Abd/pel W/ And/or W/o  11/20/2015  CLINICAL DATA:  82 year old female with bilateral chest pain radiating to the back. Shortness of breath and nausea. Bilateral lower abdominal pain. EXAM: CT ANGIOGRAPHY CHEST, ABDOMEN AND PELVIS TECHNIQUE: Multidetector CT imaging through the chest, abdomen and pelvis was performed using the standard protocol during bolus administration of intravenous contrast. Multiplanar reconstructed images and MIPs were obtained and reviewed to evaluate the vascular anatomy. CONTRAST:  111mL OMNIPAQUE IOHEXOL 350 MG/ML SOLN COMPARISON:  Chest radiographs earlier this day FINDINGS: CTA CHEST FINDINGS Normal caliber thoracic aorta without dissection, aneurysm, hematoma or acute aortic syndrome. Mild atherosclerosis of the arch and descending aorta. Common origin of the brachiocephalic and left common carotid artery, normal variant. No filling defects in the pulmonary arteries to the segmental level. Mild cardiomegaly. Coronary artery calcification versus stent. Left-sided pacemaker in place. No pericardial effusion. Small mediastinal lymph nodes, not enlarged by size criteria. No hilar adenopathy. Enlarged heterogeneous left lobe  of the thyroid consistent with goiter. Scattered atelectasis in both lungs. No pneumonia. No pulmonary edema. No pulmonary mass or suspicious nodule. Mild elevation of right hemidiaphragm. No pleural effusion. There are no acute or suspicious osseous abnormalities. Age related degenerative change in the thoracic spine. Advanced degenerative change of both shoulders with intra-articular bodies. Review of the MIP images confirms the above findings. CTA ABDOMEN AND PELVIS FINDINGS Normal caliber abdominal aorta without aneurysm, dissection, acute aortic abnormality or hematoma. Moderate atherosclerosis. Atherosclerosis at the origin of the celiac artery which remains patent. Superior and inferior mesenteric arteries are patent. Single bilateral renal arteries are patent. Arterial phase imaging of the liver, gallbladder, spleen, pancreas, and adrenal glands are normal. Symmetric renal enhancement without hydronephrosis. Small hiatal hernia. Stomach physiologically distended with ingested contents. There are no dilated or thickened bowel loops. Appendix is normal. Moderate stool burden. Colonic diverticulosis throughout without diverticulitis. No free air, free fluid, or intra-abdominal fluid collection. In the pelvis there are calcified uterine fibroids. Ovaries are quiescent, normal for age. Bladder distended without wall thickening. No pelvic free fluid or adnexal mass. No intra-abdominal adenopathy. Multilevel degenerative change in the lumbar spine, most prominent at L4-L5. There are no acute or suspicious osseous abnormalities. Review of the MIP images confirms the above findings. IMPRESSION: 1. Normal caliber thoracoabdominal aorta without acute aortic abnormality. Moderate  atherosclerosis. 2. No acute abnormality in the chest abdomen and pelvis. 3. Incidental findings as described. Electronically Signed   By: Jeb Levering M.D.   On: 11/20/2015 21:59   I have personally reviewed and evaluated these images  and lab results as part of my medical decision-making.   EKG Interpretation   Date/Time:  Thursday November 20 2015 11:48:38 EST Ventricular Rate:  83 PR Interval:  144 QRS Duration: 140 QT Interval:  438 QTC Calculation: 514 R Axis:   -81 Text Interpretation:  Atrial-sensed ventricular-paced rhythm Abnormal ECG  No previous ECGs available Confirmed by Christy Gentles  MD, Elenore Rota ([id number]) on  11/20/2015 7:14:20 PM      MDM   Final diagnoses:  Acute chest pain  Generalized abdominal pain    Nursing notes including past medical history and social history reviewed and considered in documentation Labs/vital reviewed myself and considered during evaluation xrays/imaging reviewed by myself and considered during evaluation     Ripley Fraise, MD 11/20/15 2219

## 2015-11-21 ENCOUNTER — Encounter: Payer: Self-pay | Admitting: Internal Medicine

## 2015-12-11 ENCOUNTER — Ambulatory Visit
Admission: RE | Admit: 2015-12-11 | Discharge: 2015-12-11 | Disposition: A | Discharge status: Home / Self Care | Payer: Medicare Other | Source: Ambulatory Visit

## 2015-12-11 DIAGNOSIS — H18413 Arcus senilis, bilateral: Secondary | ICD-10-CM | POA: Diagnosis not present

## 2015-12-11 DIAGNOSIS — H11153 Pinguecula, bilateral: Secondary | ICD-10-CM | POA: Diagnosis not present

## 2015-12-11 DIAGNOSIS — Z853 Personal history of malignant neoplasm of breast: Secondary | ICD-10-CM

## 2015-12-11 DIAGNOSIS — H59812 Chorioretinal scars after surgery for detachment, left eye: Secondary | ICD-10-CM | POA: Diagnosis not present

## 2015-12-11 DIAGNOSIS — Z1231 Encounter for screening mammogram for malignant neoplasm of breast: Secondary | ICD-10-CM

## 2015-12-11 DIAGNOSIS — H40023 Open angle with borderline findings, high risk, bilateral: Secondary | ICD-10-CM | POA: Diagnosis not present

## 2015-12-11 DIAGNOSIS — Z961 Presence of intraocular lens: Secondary | ICD-10-CM | POA: Diagnosis not present

## 2015-12-11 DIAGNOSIS — E119 Type 2 diabetes mellitus without complications: Secondary | ICD-10-CM | POA: Diagnosis not present

## 2015-12-11 DIAGNOSIS — H04123 Dry eye syndrome of bilateral lacrimal glands: Secondary | ICD-10-CM | POA: Diagnosis not present

## 2015-12-11 DIAGNOSIS — Z7984 Long term (current) use of oral hypoglycemic drugs: Secondary | ICD-10-CM | POA: Diagnosis not present

## 2015-12-11 DIAGNOSIS — H35033 Hypertensive retinopathy, bilateral: Secondary | ICD-10-CM | POA: Diagnosis not present

## 2015-12-11 DIAGNOSIS — H31001 Unspecified chorioretinal scars, right eye: Secondary | ICD-10-CM | POA: Diagnosis not present

## 2015-12-11 DIAGNOSIS — Z9849 Cataract extraction status, unspecified eye: Secondary | ICD-10-CM | POA: Diagnosis not present

## 2015-12-11 DIAGNOSIS — H33002 Unspecified retinal detachment with retinal break, left eye: Secondary | ICD-10-CM | POA: Diagnosis not present

## 2015-12-23 ENCOUNTER — Telehealth: Payer: Self-pay | Admitting: Cardiology

## 2015-12-23 ENCOUNTER — Encounter: Payer: Medicare Other | Admitting: *Deleted

## 2015-12-23 NOTE — Telephone Encounter (Signed)
Spoke with pt and reminded pt of remote transmission that is due today. Pt verbalized understanding.   

## 2015-12-29 ENCOUNTER — Encounter: Payer: Self-pay | Admitting: Cardiology

## 2016-01-08 ENCOUNTER — Encounter: Payer: Self-pay | Admitting: Cardiology

## 2016-01-08 ENCOUNTER — Ambulatory Visit (INDEPENDENT_AMBULATORY_CARE_PROVIDER_SITE_OTHER): Payer: Medicare Other | Admitting: *Deleted

## 2016-01-08 DIAGNOSIS — I442 Atrioventricular block, complete: Secondary | ICD-10-CM | POA: Diagnosis not present

## 2016-01-09 NOTE — Progress Notes (Signed)
Remote pacemaker transmission.   

## 2016-01-20 DIAGNOSIS — B009 Herpesviral infection, unspecified: Secondary | ICD-10-CM | POA: Diagnosis not present

## 2016-01-20 DIAGNOSIS — M150 Primary generalized (osteo)arthritis: Secondary | ICD-10-CM | POA: Diagnosis not present

## 2016-01-20 DIAGNOSIS — Z7984 Long term (current) use of oral hypoglycemic drugs: Secondary | ICD-10-CM | POA: Diagnosis not present

## 2016-01-20 DIAGNOSIS — I10 Essential (primary) hypertension: Secondary | ICD-10-CM | POA: Diagnosis not present

## 2016-01-20 DIAGNOSIS — E119 Type 2 diabetes mellitus without complications: Secondary | ICD-10-CM | POA: Diagnosis not present

## 2016-01-20 DIAGNOSIS — R252 Cramp and spasm: Secondary | ICD-10-CM | POA: Diagnosis not present

## 2016-01-22 DIAGNOSIS — E119 Type 2 diabetes mellitus without complications: Secondary | ICD-10-CM | POA: Diagnosis not present

## 2016-02-18 ENCOUNTER — Encounter: Payer: Self-pay | Admitting: Cardiology

## 2016-02-18 LAB — CUP PACEART REMOTE DEVICE CHECK
Battery Impedance: 1582 Ohm
Brady Statistic AP VS Percent: 0 %
Brady Statistic AS VP Percent: 76 %
Brady Statistic AS VS Percent: 0 %
Date Time Interrogation Session: 20170427130441
Implantable Lead Implant Date: 20030311
Implantable Lead Location: 753859
Implantable Lead Model: 4592
Lead Channel Impedance Value: 741 Ohm
Lead Channel Pacing Threshold Amplitude: 0.375 V
Lead Channel Pacing Threshold Amplitude: 0.625 V
Lead Channel Pacing Threshold Pulse Width: 0.4 ms
Lead Channel Pacing Threshold Pulse Width: 0.4 ms
Lead Channel Setting Sensing Sensitivity: 2.8 mV
MDC IDC LEAD IMPLANT DT: 20030311
MDC IDC LEAD LOCATION: 753860
MDC IDC MSMT BATTERY REMAINING LONGEVITY: 38 mo
MDC IDC MSMT BATTERY VOLTAGE: 2.76 V
MDC IDC MSMT LEADCHNL RA IMPEDANCE VALUE: 692 Ohm
MDC IDC MSMT LEADCHNL RA SENSING INTR AMPL: 2.8 mV
MDC IDC SET LEADCHNL RA PACING AMPLITUDE: 2 V
MDC IDC SET LEADCHNL RV PACING AMPLITUDE: 2.5 V
MDC IDC SET LEADCHNL RV PACING PULSEWIDTH: 0.4 ms
MDC IDC STAT BRADY AP VP PERCENT: 24 %

## 2016-04-08 ENCOUNTER — Telehealth: Payer: Self-pay | Admitting: Cardiology

## 2016-04-08 ENCOUNTER — Encounter: Payer: Medicare Other | Admitting: *Deleted

## 2016-04-08 NOTE — Telephone Encounter (Signed)
Attempted to confirm remote transmission with pt. No answer and was unable to leave a message.   

## 2016-04-09 ENCOUNTER — Encounter: Payer: Self-pay | Admitting: Cardiology

## 2016-04-23 ENCOUNTER — Telehealth: Payer: Self-pay | Admitting: Internal Medicine

## 2016-04-23 NOTE — Telephone Encounter (Signed)
Maria [name] received a letter about her missed remote transmission. I scheduled her for a new transmission and talked her through the process. Transmission received. She is due to see Dr. Lovena Le in late September. I scheduled her with him first available 06/16/16 at 4pm.

## 2016-04-23 NOTE — Telephone Encounter (Signed)
New message      Pt wants to have her device checked.

## 2016-04-26 ENCOUNTER — Ambulatory Visit (INDEPENDENT_AMBULATORY_CARE_PROVIDER_SITE_OTHER): Payer: Medicare Other | Admitting: *Deleted

## 2016-04-26 DIAGNOSIS — I442 Atrioventricular block, complete: Secondary | ICD-10-CM | POA: Diagnosis not present

## 2016-04-26 DIAGNOSIS — Z950 Presence of cardiac pacemaker: Secondary | ICD-10-CM

## 2016-04-26 NOTE — Progress Notes (Signed)
Remote pacemaker transmission.   

## 2016-04-28 ENCOUNTER — Encounter: Payer: Self-pay | Admitting: Cardiology

## 2016-05-04 LAB — CUP PACEART REMOTE DEVICE CHECK
Battery Remaining Longevity: 35 mo
Battery Voltage: 2.77 V
Brady Statistic AP VP Percent: 21 %
Date Time Interrogation Session: 20170811145650
Implantable Lead Implant Date: 20030311
Implantable Lead Location: 753859
Implantable Lead Location: 753860
Lead Channel Pacing Threshold Amplitude: 0.625 V
Lead Channel Sensing Intrinsic Amplitude: 2.8 mV
Lead Channel Setting Pacing Amplitude: 2.5 V
Lead Channel Setting Pacing Pulse Width: 0.4 ms
MDC IDC LEAD IMPLANT DT: 20030311
MDC IDC LEAD MODEL: 4592
MDC IDC MSMT BATTERY IMPEDANCE: 1755 Ohm
MDC IDC MSMT LEADCHNL RA IMPEDANCE VALUE: 758 Ohm
MDC IDC MSMT LEADCHNL RA PACING THRESHOLD AMPLITUDE: 0.375 V
MDC IDC MSMT LEADCHNL RA PACING THRESHOLD PULSEWIDTH: 0.4 ms
MDC IDC MSMT LEADCHNL RV IMPEDANCE VALUE: 757 Ohm
MDC IDC MSMT LEADCHNL RV PACING THRESHOLD PULSEWIDTH: 0.4 ms
MDC IDC SET LEADCHNL RA PACING AMPLITUDE: 2 V
MDC IDC SET LEADCHNL RV SENSING SENSITIVITY: 2.8 mV
MDC IDC STAT BRADY AP VS PERCENT: 0 %
MDC IDC STAT BRADY AS VP PERCENT: 78 %
MDC IDC STAT BRADY AS VS PERCENT: 0 %

## 2016-06-02 ENCOUNTER — Encounter: Payer: Self-pay | Admitting: Internal Medicine

## 2016-06-16 ENCOUNTER — Ambulatory Visit (INDEPENDENT_AMBULATORY_CARE_PROVIDER_SITE_OTHER): Payer: Medicare Other | Admitting: Internal Medicine

## 2016-06-16 ENCOUNTER — Encounter: Payer: Self-pay | Admitting: Internal Medicine

## 2016-06-16 VITALS — BP 144/76 | HR 72 | Ht 64.0 in | Wt 156.0 lb

## 2016-06-16 DIAGNOSIS — Z950 Presence of cardiac pacemaker: Secondary | ICD-10-CM

## 2016-06-16 DIAGNOSIS — I442 Atrioventricular block, complete: Secondary | ICD-10-CM

## 2016-06-16 LAB — CUP PACEART INCLINIC DEVICE CHECK
Battery Remaining Longevity: 33 mo
Battery Voltage: 2.76 V
Brady Statistic AP VP Percent: 21 %
Brady Statistic AS VP Percent: 79 %
Date Time Interrogation Session: 20171004175127
Implantable Lead Implant Date: 20030311
Implantable Lead Location: 753859
Implantable Lead Location: 753860
Implantable Lead Model: 4092
Implantable Lead Model: 4592
Lead Channel Pacing Threshold Amplitude: 0.5 V
Lead Channel Pacing Threshold Amplitude: 0.75 V
Lead Channel Pacing Threshold Pulse Width: 0.4 ms
Lead Channel Sensing Intrinsic Amplitude: 2.8 mV
Lead Channel Setting Pacing Amplitude: 2.5 V
Lead Channel Setting Pacing Pulse Width: 0.4 ms
MDC IDC LEAD IMPLANT DT: 20030311
MDC IDC MSMT BATTERY IMPEDANCE: 1841 Ohm
MDC IDC MSMT LEADCHNL RA IMPEDANCE VALUE: 706 Ohm
MDC IDC MSMT LEADCHNL RA PACING THRESHOLD PULSEWIDTH: 0.4 ms
MDC IDC MSMT LEADCHNL RV IMPEDANCE VALUE: 776 Ohm
MDC IDC SET LEADCHNL RA PACING AMPLITUDE: 2 V
MDC IDC SET LEADCHNL RV SENSING SENSITIVITY: 2.8 mV
MDC IDC STAT BRADY AP VS PERCENT: 0 %
MDC IDC STAT BRADY AS VS PERCENT: 0 %

## 2016-06-16 NOTE — Patient Instructions (Signed)
Medication Instructions:  Your physician recommends that you continue on your current medications as directed. Please refer to the Current Medication list given to you today.   Labwork: None Ordered   Testing/Procedures: None Ordered   Follow-Up: Your physician wants you to follow-up in: 1 year with Dr. Taylor. You will receive a reminder letter in the mail two months in advance. If you don't receive a letter, please call our office to schedule the follow-up appointment..  Remote monitoring is used to monitor your Pacemaker from home. This monitoring reduces the number of office visits required to check your device to one time per year. It allows us to keep an eye on the functioning of your device to ensure it is working properly. You are scheduled for a device check from home on 09/15/16. You may send your transmission at any time that day. If you have a wireless device, the transmission will be sent automatically. After your physician reviews your transmission, you will receive a postcard with your next transmission date.    Any Other Special Instructions Will Be Listed Below (If Applicable).     If you need a refill on your cardiac medications before your next appointment, please call your pharmacy.   

## 2016-06-16 NOTE — Progress Notes (Signed)
HPI Maria [name] returns for ongoing PPM management. She is a very pleasant 82 year old woman who looks much younger than her stated age, with a history of hypertension and dyslipidemia, status post permanent pacemaker insertion due to complete heart block.  She has not had syncope. She does have occasional pain in her hands secondary to arthritis. She denies shortness of breath. She has occasional dizzy spells. No peripheral edema has been complained of. No syncope No Known Allergies   Current Outpatient Prescriptions  Medication Sig Dispense Refill  . aspirin EC 81 MG tablet Take 81 mg by mouth daily.    . Calcium-Magnesium-Vitamin D [id number] MG-MG-UNIT TABS Take 1 tablet by mouth daily.    . diclofenac (VOLTAREN) 75 MG EC tablet Take 1 tablet by mouth 2 (two) times daily as needed.    Marland Kitchen losartan-hydrochlorothiazide (HYZAAR) 50-12.5 MG per tablet Take 1 tablet by mouth daily.    Marland Kitchen losartan-hydrochlorothiazide (HYZAAR) 50-12.5 MG tablet Take 1 tablet by mouth daily.  0  . meloxicam (MOBIC) 15 MG tablet Take 15 mg by mouth daily.  12  . metFORMIN (GLUCOPHAGE) 500 MG tablet Take 1 tablet by mouth 2 (two) times daily.    . nabumetone (RELAFEN) 500 MG tablet Take 500 mg by mouth 2 (two) times daily as needed for moderate pain. Reported on 11/20/2015  1  . OVER THE COUNTER MEDICATION Place 2 drops into both eyes daily as needed (for dry eyes).    . Polyvinyl Alcohol-Povidone (REFRESH OP) Place 1 drop into both eyes 2 (two) times daily as needed (for dry eyes).    . potassium chloride SA (K-DUR,KLOR-CON) 20 MEQ tablet Take 20 mEq by mouth daily.    . valACYclovir (VALTREX) 1000 MG tablet Take 1 g by mouth every 12 (twelve) hours as needed. For outbreaks  0  . Blood Glucose Monitoring Suppl (FREESTYLE LITE) DEVI   0  . FREESTYLE LITE test strip   4  . Lancets (FREESTYLE) lancets   4   No current facility-administered medications for this visit.      Past Medical History:  Diagnosis Date  .  Anxiety   . Arthritis   . Cancer ([place])    breast 2015  . Cardiac pacemaker in situ   . Chest pain   . Diabetes mellitus without complication ([place])   . DM type 2 (diabetes mellitus, type 2) ([place])   . Dyslipidemia   . Hypertension   . Type II or unspecified type diabetes mellitus without mention of complication, not stated as uncontrolled     ROS:   All systems reviewed and negative except as noted in the HPI.   Past Surgical History:  Procedure Laterality Date  . CATARACT EXTRACTION    . KNEE SURGERY Bilateral   . PACEMAKER INSERTION     [employer], [facility] 2003  . PACEMAKER INSERTION    . TOTAL KNEE ARTHROPLASTY Bilateral      Family History  Problem Relation Age of Onset  . Colon cancer Neg Hx      Social History   Social History  . Marital status: Widowed    Spouse name: N/A  . Number of children: 2  . Years of education: N/A   Occupational History  . Not on file.   Social History Main Topics  . Smoking status: Never Smoker  . Smokeless tobacco: Never Used  . Alcohol use No  . Drug use: No  . Sexual activity: Not on file   Other Topics Concern  .  Not on file   Social History Narrative   ** Merged History Encounter **         BP (!) 144/76   Pulse 72   Ht 5\' 4"  (1.626 m)   Wt 156 lb (70.8 kg)   BMI 26.78 kg/m   Physical Exam:  Well appearing 82 year old woman, NAD HEENT: Unremarkable Neck:  6 cm JVD, no thyromegally Back:  No CVA tenderness Lungs:  Clear with no wheezes, rales, or rhonchi. well-healed pacemaker incision. HEART:  Regular rate rhythm, no murmurs, no rubs, no clicks Abd:  soft, positive bowel sounds, no organomegally, no rebound, no guarding Ext:  2 plus pulses, no edema, no cyanosis, no clubbing Skin:  No rashes no nodules Neuro:  CN II through XII intact, motor grossly intact   DEVICE  Normal device function.  See PaceArt for details.   Assess/Plan: 1. Complete heart block - she is status post permanent  pacemaker insertion and asymptomatic 2. Hypertension - her blood pressure is reasonably well controlled. She is encouraged to maintain a low-sodium diet. 3. Pacemaker - her Medtronic dual-chamber pacemaker is working normally. She has approximately 3 years of battery longevity.  Cristopher Peru, M.D.

## 2016-07-22 ENCOUNTER — Other Ambulatory Visit: Payer: Self-pay | Admitting: Family Medicine

## 2016-07-22 DIAGNOSIS — R252 Cramp and spasm: Secondary | ICD-10-CM

## 2016-07-22 DIAGNOSIS — B009 Herpesviral infection, unspecified: Secondary | ICD-10-CM | POA: Diagnosis not present

## 2016-07-22 DIAGNOSIS — Z23 Encounter for immunization: Secondary | ICD-10-CM | POA: Diagnosis not present

## 2016-07-22 DIAGNOSIS — I10 Essential (primary) hypertension: Secondary | ICD-10-CM | POA: Diagnosis not present

## 2016-07-22 DIAGNOSIS — E119 Type 2 diabetes mellitus without complications: Secondary | ICD-10-CM | POA: Diagnosis not present

## 2016-07-22 DIAGNOSIS — M150 Primary generalized (osteo)arthritis: Secondary | ICD-10-CM | POA: Diagnosis not present

## 2016-07-29 DIAGNOSIS — H18413 Arcus senilis, bilateral: Secondary | ICD-10-CM | POA: Diagnosis not present

## 2016-07-29 DIAGNOSIS — H0015 Chalazion left lower eyelid: Secondary | ICD-10-CM | POA: Diagnosis not present

## 2016-07-29 DIAGNOSIS — H11423 Conjunctival edema, bilateral: Secondary | ICD-10-CM | POA: Diagnosis not present

## 2016-07-29 DIAGNOSIS — Z9849 Cataract extraction status, unspecified eye: Secondary | ICD-10-CM | POA: Diagnosis not present

## 2016-07-29 DIAGNOSIS — Z961 Presence of intraocular lens: Secondary | ICD-10-CM | POA: Diagnosis not present

## 2016-07-29 DIAGNOSIS — H40013 Open angle with borderline findings, low risk, bilateral: Secondary | ICD-10-CM | POA: Diagnosis not present

## 2016-07-29 DIAGNOSIS — H11153 Pinguecula, bilateral: Secondary | ICD-10-CM | POA: Diagnosis not present

## 2016-07-30 ENCOUNTER — Ambulatory Visit
Admission: RE | Admit: 2016-07-30 | Discharge: 2016-07-30 | Disposition: A | Discharge status: Home / Self Care | Payer: Medicare Other | Source: Ambulatory Visit | Attending: Family Medicine | Admitting: Family Medicine

## 2016-07-30 DIAGNOSIS — M79604 Pain in right leg: Secondary | ICD-10-CM | POA: Diagnosis not present

## 2016-07-30 DIAGNOSIS — R252 Cramp and spasm: Secondary | ICD-10-CM

## 2016-07-30 DIAGNOSIS — M79605 Pain in left leg: Secondary | ICD-10-CM | POA: Diagnosis not present

## 2016-09-15 ENCOUNTER — Ambulatory Visit (INDEPENDENT_AMBULATORY_CARE_PROVIDER_SITE_OTHER): Payer: Medicare Other | Admitting: *Deleted

## 2016-09-15 DIAGNOSIS — I442 Atrioventricular block, complete: Secondary | ICD-10-CM | POA: Diagnosis not present

## 2016-09-16 ENCOUNTER — Telehealth: Payer: Self-pay | Admitting: Cardiology

## 2016-09-16 ENCOUNTER — Telehealth: Payer: Self-pay | Admitting: Internal Medicine

## 2016-09-16 NOTE — Telephone Encounter (Signed)
New Message   Patient says device is not transmitting.

## 2016-09-16 NOTE — Telephone Encounter (Signed)
Spoke w/ pt and determined that her home monitor needed new batteries. Pt is going to call back once she gets new batteries.

## 2016-09-16 NOTE — Telephone Encounter (Signed)
Confirmed remote transmission w/ pt daughter.   

## 2016-09-17 ENCOUNTER — Encounter: Payer: Self-pay | Admitting: Cardiology

## 2016-09-17 ENCOUNTER — Telehealth: Payer: Self-pay | Admitting: Internal Medicine

## 2016-09-17 NOTE — Telephone Encounter (Signed)
See phone note from 09/16/16.

## 2016-09-17 NOTE — Telephone Encounter (Signed)
New message  Pt call requesting to speak with Rn about pervious call with device. Pt did not understand instructions. Please call back to discuss

## 2016-09-17 NOTE — Telephone Encounter (Signed)
Spoke w/ pt and helped her send her remote transmission. Transmission received pt aware.

## 2016-09-22 ENCOUNTER — Encounter: Payer: Self-pay | Admitting: Cardiology

## 2016-09-22 NOTE — Progress Notes (Signed)
Remote pacemaker transmission.   

## 2016-09-24 LAB — CUP PACEART REMOTE DEVICE CHECK
Battery Remaining Longevity: 31 mo
Date Time Interrogation Session: 20180105213208
Implantable Lead Implant Date: 20030311
Implantable Lead Location: 753859
Implantable Pulse Generator Implant Date: 20100120
Lead Channel Pacing Threshold Amplitude: 0.625 V
Lead Channel Pacing Threshold Pulse Width: 0.4 ms
Lead Channel Setting Pacing Amplitude: 2 V
Lead Channel Setting Pacing Pulse Width: 0.4 ms
MDC IDC LEAD IMPLANT DT: 20030311
MDC IDC LEAD LOCATION: 753860
MDC IDC MSMT BATTERY IMPEDANCE: 1959 Ohm
MDC IDC MSMT BATTERY VOLTAGE: 2.76 V
MDC IDC MSMT LEADCHNL RA IMPEDANCE VALUE: 669 Ohm
MDC IDC MSMT LEADCHNL RA PACING THRESHOLD AMPLITUDE: 0.375 V
MDC IDC MSMT LEADCHNL RA PACING THRESHOLD PULSEWIDTH: 0.4 ms
MDC IDC MSMT LEADCHNL RV IMPEDANCE VALUE: 750 Ohm
MDC IDC SET LEADCHNL RV PACING AMPLITUDE: 2.5 V
MDC IDC SET LEADCHNL RV SENSING SENSITIVITY: 2.8 mV
MDC IDC STAT BRADY AP VP PERCENT: 11 %
MDC IDC STAT BRADY AP VS PERCENT: 0 %
MDC IDC STAT BRADY AS VP PERCENT: 88 %
MDC IDC STAT BRADY AS VS PERCENT: 0 %

## 2016-10-06 ENCOUNTER — Emergency Department (HOSPITAL_COMMUNITY): Payer: Medicare Other

## 2016-10-06 ENCOUNTER — Inpatient Hospital Stay (HOSPITAL_COMMUNITY)
Admission: EM | Admit: 2016-10-06 | Discharge: 2016-10-12 | DRG: 445 | Disposition: A | Discharge status: Skilled Nursing Facility | Payer: Medicare Other | Attending: Internal Medicine | Admitting: Internal Medicine

## 2016-10-06 ENCOUNTER — Encounter (HOSPITAL_COMMUNITY): Payer: Self-pay | Admitting: *Deleted

## 2016-10-06 DIAGNOSIS — F05 Delirium due to known physiological condition: Secondary | ICD-10-CM | POA: Diagnosis not present

## 2016-10-06 DIAGNOSIS — R778 Other specified abnormalities of plasma proteins: Secondary | ICD-10-CM | POA: Diagnosis present

## 2016-10-06 DIAGNOSIS — Z6829 Body mass index (BMI) 29.0-29.9, adult: Secondary | ICD-10-CM

## 2016-10-06 DIAGNOSIS — E872 Acidosis, unspecified: Secondary | ICD-10-CM | POA: Diagnosis present

## 2016-10-06 DIAGNOSIS — T402X5A Adverse effect of other opioids, initial encounter: Secondary | ICD-10-CM | POA: Diagnosis not present

## 2016-10-06 DIAGNOSIS — R7989 Other specified abnormal findings of blood chemistry: Secondary | ICD-10-CM

## 2016-10-06 DIAGNOSIS — I272 Pulmonary hypertension, unspecified: Secondary | ICD-10-CM | POA: Diagnosis not present

## 2016-10-06 DIAGNOSIS — E1142 Type 2 diabetes mellitus with diabetic polyneuropathy: Secondary | ICD-10-CM

## 2016-10-06 DIAGNOSIS — K810 Acute cholecystitis: Secondary | ICD-10-CM | POA: Diagnosis not present

## 2016-10-06 DIAGNOSIS — Z96653 Presence of artificial knee joint, bilateral: Secondary | ICD-10-CM | POA: Diagnosis present

## 2016-10-06 DIAGNOSIS — K819 Cholecystitis, unspecified: Secondary | ICD-10-CM

## 2016-10-06 DIAGNOSIS — I442 Atrioventricular block, complete: Secondary | ICD-10-CM | POA: Diagnosis present

## 2016-10-06 DIAGNOSIS — I10 Essential (primary) hypertension: Secondary | ICD-10-CM | POA: Diagnosis present

## 2016-10-06 DIAGNOSIS — Y92239 Unspecified place in hospital as the place of occurrence of the external cause: Secondary | ICD-10-CM | POA: Diagnosis not present

## 2016-10-06 DIAGNOSIS — R000 Tachycardia, unspecified: Secondary | ICD-10-CM | POA: Diagnosis present

## 2016-10-06 DIAGNOSIS — E663 Overweight: Secondary | ICD-10-CM | POA: Diagnosis present

## 2016-10-06 DIAGNOSIS — R112 Nausea with vomiting, unspecified: Secondary | ICD-10-CM | POA: Diagnosis not present

## 2016-10-06 DIAGNOSIS — Z7982 Long term (current) use of aspirin: Secondary | ICD-10-CM

## 2016-10-06 DIAGNOSIS — R0602 Shortness of breath: Secondary | ICD-10-CM | POA: Diagnosis not present

## 2016-10-06 DIAGNOSIS — R8271 Bacteriuria: Secondary | ICD-10-CM | POA: Diagnosis present

## 2016-10-06 DIAGNOSIS — R079 Chest pain, unspecified: Secondary | ICD-10-CM | POA: Diagnosis present

## 2016-10-06 DIAGNOSIS — R1110 Vomiting, unspecified: Secondary | ICD-10-CM | POA: Diagnosis not present

## 2016-10-06 DIAGNOSIS — E119 Type 2 diabetes mellitus without complications: Secondary | ICD-10-CM | POA: Diagnosis present

## 2016-10-06 DIAGNOSIS — Z950 Presence of cardiac pacemaker: Secondary | ICD-10-CM

## 2016-10-06 DIAGNOSIS — I248 Other forms of acute ischemic heart disease: Secondary | ICD-10-CM | POA: Diagnosis not present

## 2016-10-06 DIAGNOSIS — E785 Hyperlipidemia, unspecified: Secondary | ICD-10-CM | POA: Diagnosis present

## 2016-10-06 DIAGNOSIS — Z7984 Long term (current) use of oral hypoglycemic drugs: Secondary | ICD-10-CM

## 2016-10-06 DIAGNOSIS — Z781 Physical restraint status: Secondary | ICD-10-CM

## 2016-10-06 DIAGNOSIS — Z853 Personal history of malignant neoplasm of breast: Secondary | ICD-10-CM

## 2016-10-06 DIAGNOSIS — B961 Klebsiella pneumoniae [K. pneumoniae] as the cause of diseases classified elsewhere: Secondary | ICD-10-CM | POA: Diagnosis present

## 2016-10-06 DIAGNOSIS — R531 Weakness: Secondary | ICD-10-CM | POA: Diagnosis present

## 2016-10-06 DIAGNOSIS — Z79899 Other long term (current) drug therapy: Secondary | ICD-10-CM

## 2016-10-06 DIAGNOSIS — Z791 Long term (current) use of non-steroidal anti-inflammatories (NSAID): Secondary | ICD-10-CM

## 2016-10-06 LAB — I-STAT CHEM 8, ED
BUN: 16 mg/dL (ref 6–20)
CREATININE: 0.6 mg/dL (ref 0.44–1.00)
Calcium, Ion: 1.08 mmol/L — ABNORMAL LOW (ref 1.15–1.40)
Chloride: 93 mmol/L — ABNORMAL LOW (ref 101–111)
GLUCOSE: 268 mg/dL — AB (ref 65–99)
HCT: 48 % — ABNORMAL HIGH (ref 36.0–46.0)
HEMOGLOBIN: 16.3 g/dL — AB (ref 12.0–15.0)
POTASSIUM: 3.4 mmol/L — AB (ref 3.5–5.1)
Sodium: 136 mmol/L (ref 135–145)
TCO2: 25 mmol/L (ref 0–100)

## 2016-10-06 LAB — I-STAT VENOUS BLOOD GAS, ED
Acid-Base Excess: 3 mmol/L — ABNORMAL HIGH (ref 0.0–2.0)
Bicarbonate: 25.4 mmol/L (ref 20.0–28.0)
O2 Saturation: 85 %
PH VEN: 7.509 — AB (ref 7.250–7.430)
PO2 VEN: 44 mmHg (ref 32.0–45.0)
TCO2: 26 mmol/L (ref 0–100)
pCO2, Ven: 31.9 mmHg — ABNORMAL LOW (ref 44.0–60.0)

## 2016-10-06 LAB — BASIC METABOLIC PANEL
Anion gap: 18 — ABNORMAL HIGH (ref 5–15)
BUN: 13 mg/dL (ref 6–20)
CO2: 21 mmol/L — ABNORMAL LOW (ref 22–32)
CREATININE: 0.81 mg/dL (ref 0.44–1.00)
Calcium: 10.2 mg/dL (ref 8.9–10.3)
Chloride: 93 mmol/L — ABNORMAL LOW (ref 101–111)
GFR calc Af Amer: >60 mL/min (ref >60)
Glucose, Bld: 291 mg/dL — ABNORMAL HIGH (ref 65–99)
Potassium: 3.8 mmol/L (ref 3.5–5.1)
SODIUM: 132 mmol/L — AB (ref 135–145)

## 2016-10-06 LAB — CBG MONITORING, ED
Glucose-Capillary: 268 mg/dL — ABNORMAL HIGH (ref 65–99)
Glucose-Capillary: 258 mg/dL — ABNORMAL HIGH (ref 65–99)

## 2016-10-06 LAB — I-STAT CG4 LACTIC ACID, ED
LACTIC ACID, VENOUS: 3.21 mmol/L — AB (ref 0.5–1.9)
Lactic Acid, Venous: 3.85 mmol/L (ref 0.5–1.9)

## 2016-10-06 LAB — CBC
HCT: 43.5 % (ref 36.0–46.0)
Hemoglobin: 15.1 g/dL — ABNORMAL HIGH (ref 12.0–15.0)
MCH: 29 pg (ref 26.0–34.0)
MCHC: 34.7 g/dL (ref 30.0–36.0)
MCV: 83.5 fL (ref 78.0–100.0)
Platelets: 244 10*3/uL (ref 150–400)
RBC: 5.21 MIL/uL — ABNORMAL HIGH (ref 3.87–5.11)
RDW: 12.6 % (ref 11.5–15.5)
WBC: 10.4 10*3/uL (ref 4.0–10.5)

## 2016-10-06 LAB — I-STAT TROPONIN, ED
TROPONIN I, POC: 0.18 ng/mL — AB (ref 0.00–0.08)
Troponin i, poc: 0.06 ng/mL (ref 0.00–0.08)

## 2016-10-06 LAB — BETA-HYDROXYBUTYRIC ACID: Beta-Hydroxybutyric Acid: 1.06 mmol/L — ABNORMAL HIGH (ref 0.05–0.27)

## 2016-10-06 MED ORDER — SODIUM CHLORIDE 0.9 % IV BOLUS (SEPSIS)
1000.0000 mL | Freq: Once | INTRAVENOUS | Status: AC
  Administered 2016-10-07: 1000 mL via INTRAVENOUS

## 2016-10-06 MED ORDER — ONDANSETRON HCL 4 MG/2ML IJ SOLN
4.0000 mg | Freq: Once | INTRAMUSCULAR | Status: AC
  Administered 2016-10-06: 4 mg via INTRAVENOUS
  Filled 2016-10-06: qty 2

## 2016-10-06 MED ORDER — IOPAMIDOL (ISOVUE-300) INJECTION 61%
INTRAVENOUS | Status: AC
Start: 2016-10-06 — End: 2016-10-06
  Administered 2016-10-06: 100 mL via INTRAVENOUS
  Filled 2016-10-06: qty 100

## 2016-10-06 MED ORDER — ASPIRIN 81 MG PO CHEW
324.0000 mg | CHEWABLE_TABLET | Freq: Once | ORAL | Status: AC
  Administered 2016-10-07: 324 mg via ORAL
  Filled 2016-10-06: qty 4

## 2016-10-06 MED ORDER — PIPERACILLIN-TAZOBACTAM 3.375 G IVPB 30 MIN
3.3750 g | Freq: Once | INTRAVENOUS | Status: AC
  Administered 2016-10-07: 3.375 g via INTRAVENOUS
  Filled 2016-10-06: qty 50

## 2016-10-06 MED ORDER — DEXTROSE-NACL 5-0.45 % IV SOLN: INTRAVENOUS | Status: DC

## 2016-10-06 MED ORDER — VANCOMYCIN HCL IN DEXTROSE 1-5 GM/200ML-% IV SOLN
1000.0000 mg | Freq: Once | INTRAVENOUS | Status: DC
  Filled 2016-10-06: qty 200

## 2016-10-06 MED ORDER — SODIUM CHLORIDE 0.9 % IV SOLN
INTRAVENOUS | Status: DC
  Administered 2016-10-07: 2 [IU]/h via INTRAVENOUS
  Filled 2016-10-06: qty 2.5

## 2016-10-06 MED ORDER — SODIUM CHLORIDE 0.9 % IV SOLN
Freq: Once | INTRAVENOUS | Status: AC
  Administered 2016-10-06: 21:00:00 via INTRAVENOUS

## 2016-10-06 NOTE — ED Notes (Signed)
Pt taken by wheelchair to restroom

## 2016-10-06 NOTE — ED Triage Notes (Signed)
Pt reports chest pain that started on Monday. Pt reports that she has N/V as well pain is worse with movement. Pt states that she has generalized weakness .

## 2016-10-06 NOTE — ED Notes (Signed)
Pt taken to CT.

## 2016-10-06 NOTE — ED Provider Notes (Signed)
[facility] DEPT Provider Note   CSN: [id number] Arrival date & time: 10/06/16  1805     History   Chief Complaint Chief Complaint  Patient presents with  . Chest Pain    HPI Maria [name] is a 83 y.o. female.  HPI 87yoF with hx of DM on metformin, HLD, HTN presenting with Chest pain, upper abd pain, Nausea and vomiting that started on Monday. Chest pain is sharp, vague and mild that worsens with vomiting. Pain does not radiate and does not worsen with exertion. No cardiac hx. Abdominal pain is crampy that worsens with vomitting. Emesis is NBNB. States she is passing gas. Also endorses generalized weakness, myalgias and light-headedness with no episodes of syncope.   Past Medical History:  Diagnosis Date  . Anxiety   . Arthritis   . Cancer ([place])    breast 2015  . Cardiac pacemaker in situ   . Chest pain   . Diabetes mellitus without complication ([place])   . DM type 2 (diabetes mellitus, type 2) ([place])   . Dyslipidemia   . Hypertension   . Type II or unspecified type diabetes mellitus without mention of complication, not stated as uncontrolled     Patient Active Problem List   Diagnosis Date Noted  . Dyslipidemia 06/05/2013  . Cardiac pacemaker 05/30/2013  . DM type 2 (diabetes mellitus, type 2) ([place]) 05/21/2013  . Chest pain 05/20/2013  . HTN (hypertension) 05/20/2013    Past Surgical History:  Procedure Laterality Date  . CATARACT EXTRACTION    . KNEE SURGERY Bilateral   . PACEMAKER INSERTION     [employer], [facility] 2003  . PACEMAKER INSERTION    . TOTAL KNEE ARTHROPLASTY Bilateral     OB History    Gravida Para Term Preterm AB Living   0 0 0 0 0     SAB TAB Ectopic Multiple Live Births   0 0 0           Home Medications    Prior to Admission medications   Medication Sig Start Date End Date Taking? Authorizing Provider  acetaminophen (TYLENOL) 500 MG tablet Take 500 mg by mouth every 6 (six) hours as needed for mild pain.   Yes Historical  Provider, MD  aspirin EC 81 MG tablet Take 81 mg by mouth daily.   Yes Historical Provider, MD  Calcium-Magnesium-Vitamin D [id number] MG-MG-UNIT TABS Take 1 tablet by mouth daily.   Yes Historical Provider, MD  diclofenac (VOLTAREN) 75 MG EC tablet Take 1 tablet by mouth 2 (two) times daily as needed. 07/18/14  Yes Historical Provider, MD  losartan-hydrochlorothiazide (HYZAAR) 50-12.5 MG per tablet Take 1 tablet by mouth daily. 09/11/14  Yes Historical Provider, MD  meloxicam (MOBIC) 15 MG tablet Take 15 mg by mouth daily. 05/27/15  Yes Historical Provider, MD  metFORMIN (GLUCOPHAGE) 500 MG tablet Take 1 tablet by mouth 2 (two) times daily. 09/09/14  Yes Historical Provider, MD  Multiple Vitamin (MULTIVITAMIN WITH MINERALS) TABS tablet Take 1 tablet by mouth daily.   Yes Historical Provider, MD  nabumetone (RELAFEN) 500 MG tablet Take 500 mg by mouth 2 (two) times daily as needed for moderate pain. Reported on 11/20/2015 10/27/15  Yes Historical Provider, MD  [place] 2 drops into both eyes daily as needed (for dry eyes).   Yes Historical Provider, MD  Polyvinyl Alcohol-Povidone (REFRESH OP) Place 1 drop into both eyes 2 (two) times daily as needed (for dry eyes).   Yes  Historical Provider, MD  potassium chloride SA (K-DUR,KLOR-CON) 20 MEQ tablet Take 20 mEq by mouth daily.   Yes Historical Provider, MD  valACYclovir (VALTREX) 1000 MG tablet Take 1 g by mouth every 12 (twelve) hours as needed. For outbreaks 10/27/15  Yes Historical Provider, MD  Blood Glucose Monitoring Suppl (FREESTYLE LITE) DEVI  08/28/14   Historical Provider, MD  FREESTYLE LITE test strip  08/28/14   Historical Provider, MD  Lancets (FREESTYLE) lancets  08/28/14   Historical Provider, MD    Family History Family History  Problem Relation Age of Onset  . Colon cancer Neg Hx     Social History Social History  Substance Use Topics  . Smoking status: Never Smoker  . Smokeless tobacco: Never Used  .  Alcohol use No     Allergies   Patient has no known allergies.   Review of Systems Review of Systems  Constitutional: Positive for fatigue. Negative for chills and fever.  HENT: Negative.   Respiratory: Negative for cough, shortness of breath and wheezing.   Cardiovascular: Positive for chest pain. Negative for palpitations and leg swelling.  Gastrointestinal: Positive for abdominal pain, nausea and vomiting. Negative for diarrhea.  Genitourinary: Negative for difficulty urinating and dysuria.  Musculoskeletal: Negative for back pain and neck pain.  Skin: Negative.   Neurological: Positive for light-headedness. Negative for dizziness, facial asymmetry, weakness and numbness.     Physical Exam Updated Vital Signs BP 172/92   Pulse 116   Temp 98.7 F (37.1 C) (Oral)   Resp (!) 33   SpO2 96%   Physical Exam  Constitutional: She is oriented to person, place, and time. No distress.  HENT:  Head: Normocephalic and atraumatic.  Eyes: Conjunctivae and EOM are normal. Pupils are equal, round, and reactive to light.  Neck: Normal range of motion and full passive range of motion without pain. Neck supple.  Cardiovascular: Regular rhythm, S1 normal, S2 normal, normal heart sounds, intact distal pulses and normal pulses.  Tachycardia present.   No murmur heard. Pulmonary/Chest: Effort normal and breath sounds normal. She has no decreased breath sounds. She has no wheezes. She has no rhonchi.  Abdominal: Soft. She exhibits no distension. There is generalized tenderness (mild). There is no rigidity, no rebound, no guarding, no tenderness at McBurney's point and negative Murphy's sign.  Neurological: She is alert and oriented to person, place, and time. She has normal strength. No cranial nerve deficit or sensory deficit.  Nursing note and vitals reviewed.    ED Treatments / Results  Labs (all labs ordered are listed, but only abnormal results are displayed) Labs Reviewed  BASIC  METABOLIC PANEL - Abnormal; Notable for the following:       Result Value   Sodium 132 (*)    Chloride 93 (*)    CO2 21 (*)    Glucose, Bld 291 (*)    Anion gap 18 (*)    All other components within normal limits  CBC - Abnormal; Notable for the following:    RBC 5.21 (*)    Hemoglobin 15.1 (*)    All other components within normal limits  BETA-HYDROXYBUTYRIC ACID - Abnormal; Notable for the following:    Beta-Hydroxybutyric Acid 1.06 (*)    All other components within normal limits  I-STAT CG4 LACTIC ACID, ED - Abnormal; Notable for the following:    Lactic Acid, Venous 3.21 (*)    All other components within normal limits  CBG MONITORING, ED - Abnormal; Notable for  the following:    Glucose-Capillary 268 (*)    All other components within normal limits  I-STAT VENOUS BLOOD GAS, ED - Abnormal; Notable for the following:    pH, Ven 7.509 (*)    pCO2, Ven 31.9 (*)    Acid-Base Excess 3.0 (*)    All other components within normal limits  I-STAT CHEM 8, ED - Abnormal; Notable for the following:    Potassium 3.4 (*)    Chloride 93 (*)    Glucose, Bld 268 (*)    Calcium, Ion 1.08 (*)    Hemoglobin 16.3 (*)    HCT 48.0 (*)    All other components within normal limits  CBG MONITORING, ED - Abnormal; Notable for the following:    Glucose-Capillary 258 (*)    All other components within normal limits  BLOOD GAS, VENOUS  BASIC METABOLIC PANEL  BASIC METABOLIC PANEL  BASIC METABOLIC PANEL  BASIC METABOLIC PANEL  BASIC METABOLIC PANEL  I-STAT [place], ED  I-STAT CG4 LACTIC ACID, ED  I-STAT TROPOININ, ED    EKG  EKG Interpretation  Date/Time:  Wednesday October 06 2016 18:12:47 EST Ventricular Rate:  112 PR Interval:  130 QRS Duration: 138 QT Interval:  394 QTC Calculation: 537 R Axis:   -92 Text Interpretation:  Atrial-sensed ventricular-paced rhythm with occasional supraventricular complexes Abnormal ECG No acute changes Confirmed by Kathrynn Humble, MD, Thelma Comp [phone]) on  10/06/2016 10:18:54 PM       Radiology Dg Chest 2 View  Result Date: 10/06/2016 CLINICAL DATA:  Mid chest pain for 2 days with shortness of breath EXAM: CHEST  2 VIEW COMPARISON:  04/04/2014 FINDINGS: Low volume film with stable asymmetric elevation right hemidiaphragm. The lungs are clear wiithout focal pneumonia, edema, pneumothorax or pleural effusion. Cardiopericardial silhouette is at upper limits of normal for size. Left-sided dual lead permanent pacemaker again noted. Degenerative changes are evident in the shoulders bilaterally. IMPRESSION: Stable.  No acute findings. Electronically Signed   By: Misty Stanley M.D.   On: 10/06/2016 18:53    Procedures Procedures (including critical care time) Procedure note: Ultrasound Guided Peripheral IV Ultrasound guided 20 g peripheral 1.88 inch angiocath IV placement performed by me. Indications: Nursing unable to place IV. Details: The L antecubital fossa and upper arm was evaluated with a multifrequency linear probe. Several patent brachial veins are noted. 1 attempts were made to cannulate a superficial vein under realtime US guidance with successful cannulation of the vein and catheter placement. There is return of non-pulsatile dark red blood. The patient tolerated the procedure well without complications. An ultrasound image is archived.   Medications Ordered in ED Medications  insulin regular (NOVOLIN R,HUMULIN R) 250 Units in sodium chloride 0.9 % 250 mL (1 Units/mL) infusion (not administered)  dextrose 5 %-0.45 % sodium chloride infusion (not administered)  0.9 %  sodium chloride infusion ( Intravenous New Bag/Given 10/06/16 2118)  ondansetron (ZOFRAN) injection 4 mg (4 mg Intravenous Given 10/06/16 2125)     Initial Impression / Assessment and Plan / ED Course  I have reviewed the triage vital signs and the nursing notes.  Pertinent labs & imaging results that were available during my care of the patient were reviewed by me and  considered in my medical decision making (see chart for details).  Clinical Course as of Oct 07 121  Wed Oct 06, 2016  2342 Pt has chest pain, abd pain, weakness, myalgias. All symptoms started 2-3 days ago. She has DM and other medical comorbidities.  Complains of no uti like symptoms, or cough, or rashes. She has abd pain all over and is constipated. She is passing flatus. Pt has no dib. Chest pain is intermittent.  Initial workup showed elevated AG and lactic acidosis. PH was fine. We suspected dehydration and ordered fluids. Repeat labs unchanged, and the AG is still 18, BOHB > 1. We will start DKA protocol. PT is noted to have generalized pain in her abdomen - CT abdomen and UA ordered. Suspicion is high for colitis/enteritis/diverticulitis/cystitis///pyelonephritis.  Lactate has increased from 3.2-->3.85. Code sepsis activated. Vanc and Zosyn ordered. We don't have a source - but with rising lactate and no significant improvement in the ER, workup and treatment focus will be now expanded. CT and LFTs/Lipase pending  [AN]    Clinical Course User Index [AN] Varney Biles, MD   89yoF presenting with vague Cp, N/V and upper abdominal pain since Monday. Has DM and on Metformin. Multiple episodes of emesis each day. Pt noted to be hypertensive to SBP 180s, tachycardic to 100s and slightly tachypneic. Afebrile. EKG with atrial sensed ventricularly paced rhythm with tachycardia but no changes from prior. CXR with no acute findings. Troponin ordered in triage negative.  CBC with WBC 10.4, Hgb 15.1. BMP with hyponatremia, hypochloremic acidosis with glucose of 291 and anion gap of 18. Concern for possible DKA vs lactic acidosis from metformin due to dehydration from vomiting. Lactate, BHB, VBG ordered. Bolus given  Lactate 3.21. VBG with pH 7.5, pCO2 31.9, pO2 44, bicarb 25.4.  BHB 1.06. iStat chem 8 with Co2 of 25 but gap is still 18. Glucose 268. Started on insulin drip and D5 1/2NS.   Repeat  lactate 3.85. As lactate is rising and pt still with abd pain, although exam is unimpressive, CT abd ordered. Cathed urine will be obtained and code sepsis initiated. Blood cultures drawn. Vanc/zosyn given for unknown source.   Repeat trop 0.18, aspirin given. CP still mild and not gotten worse. Pt discussed with Intensivist on call and requests we give more fluids and speak with hospitalist for admission to stepdown. Dr. Alcario Drought with hospitalist will admit the patient for further management.  CT abd came back with findings concerning for acalculus cholecystitis. Dr. Hulen Skains with general surgery consulted and will see the patient. Nurses have had difficulty with 2nd PIV, abx not given yet. Now that chole is most likely cause of infection, vanc d/c'd and pt will be given zosyn only. 2nd PIV placed under US guidance by me at 0125. Pt to be admitted to medicine for further evaluation.  Patient care discussed and supervised by my attending, Dr. Kathrynn Humble. Drucie Ip, MD   Final Clinical Impressions(s) / ED Diagnoses   Final diagnoses:  None    New Prescriptions New Prescriptions   No medications on file     Corey Laski [place] Demarius Archila, MD 10/07/16 0127    Varney Biles, MD 10/09/16 [id number]

## 2016-10-06 NOTE — ED Notes (Signed)
Patient's son at desk stating patient is feeling faint.  Patient states episodes of light-headedness all day today.  Patient denies changes in CP or other symptoms.  Vitals reassessed.

## 2016-10-07 ENCOUNTER — Inpatient Hospital Stay (HOSPITAL_COMMUNITY): Payer: Medicare Other

## 2016-10-07 DIAGNOSIS — R531 Weakness: Secondary | ICD-10-CM | POA: Diagnosis present

## 2016-10-07 DIAGNOSIS — K810 Acute cholecystitis: Principal | ICD-10-CM | POA: Diagnosis present

## 2016-10-07 DIAGNOSIS — R8271 Bacteriuria: Secondary | ICD-10-CM | POA: Diagnosis present

## 2016-10-07 DIAGNOSIS — Y92239 Unspecified place in hospital as the place of occurrence of the external cause: Secondary | ICD-10-CM | POA: Diagnosis not present

## 2016-10-07 DIAGNOSIS — E872 Acidosis, unspecified: Secondary | ICD-10-CM | POA: Diagnosis present

## 2016-10-07 DIAGNOSIS — R1110 Vomiting, unspecified: Secondary | ICD-10-CM | POA: Diagnosis not present

## 2016-10-07 DIAGNOSIS — R079 Chest pain, unspecified: Secondary | ICD-10-CM | POA: Diagnosis not present

## 2016-10-07 DIAGNOSIS — R778 Other specified abnormalities of plasma proteins: Secondary | ICD-10-CM | POA: Diagnosis present

## 2016-10-07 DIAGNOSIS — I272 Pulmonary hypertension, unspecified: Secondary | ICD-10-CM | POA: Diagnosis not present

## 2016-10-07 DIAGNOSIS — R7989 Other specified abnormal findings of blood chemistry: Secondary | ICD-10-CM

## 2016-10-07 DIAGNOSIS — R0789 Other chest pain: Secondary | ICD-10-CM

## 2016-10-07 DIAGNOSIS — R000 Tachycardia, unspecified: Secondary | ICD-10-CM | POA: Diagnosis not present

## 2016-10-07 DIAGNOSIS — I209 Angina pectoris, unspecified: Secondary | ICD-10-CM | POA: Diagnosis not present

## 2016-10-07 DIAGNOSIS — E119 Type 2 diabetes mellitus without complications: Secondary | ICD-10-CM

## 2016-10-07 DIAGNOSIS — Z7982 Long term (current) use of aspirin: Secondary | ICD-10-CM | POA: Diagnosis not present

## 2016-10-07 DIAGNOSIS — R112 Nausea with vomiting, unspecified: Secondary | ICD-10-CM | POA: Diagnosis not present

## 2016-10-07 DIAGNOSIS — R6511 Systemic inflammatory response syndrome (SIRS) of non-infectious origin with acute organ dysfunction: Secondary | ICD-10-CM | POA: Diagnosis not present

## 2016-10-07 DIAGNOSIS — Z01810 Encounter for preprocedural cardiovascular examination: Secondary | ICD-10-CM

## 2016-10-07 DIAGNOSIS — R1084 Generalized abdominal pain: Secondary | ICD-10-CM | POA: Diagnosis not present

## 2016-10-07 DIAGNOSIS — Z853 Personal history of malignant neoplasm of breast: Secondary | ICD-10-CM | POA: Diagnosis not present

## 2016-10-07 DIAGNOSIS — R748 Abnormal levels of other serum enzymes: Secondary | ICD-10-CM | POA: Diagnosis not present

## 2016-10-07 DIAGNOSIS — R0602 Shortness of breath: Secondary | ICD-10-CM | POA: Diagnosis not present

## 2016-10-07 DIAGNOSIS — I10 Essential (primary) hypertension: Secondary | ICD-10-CM | POA: Diagnosis not present

## 2016-10-07 DIAGNOSIS — B961 Klebsiella pneumoniae [K. pneumoniae] as the cause of diseases classified elsewhere: Secondary | ICD-10-CM | POA: Diagnosis present

## 2016-10-07 DIAGNOSIS — Z781 Physical restraint status: Secondary | ICD-10-CM | POA: Diagnosis not present

## 2016-10-07 DIAGNOSIS — Z7984 Long term (current) use of oral hypoglycemic drugs: Secondary | ICD-10-CM | POA: Diagnosis not present

## 2016-10-07 DIAGNOSIS — I442 Atrioventricular block, complete: Secondary | ICD-10-CM | POA: Diagnosis present

## 2016-10-07 DIAGNOSIS — Z6829 Body mass index (BMI) 29.0-29.9, adult: Secondary | ICD-10-CM | POA: Diagnosis not present

## 2016-10-07 DIAGNOSIS — M6281 Muscle weakness (generalized): Secondary | ICD-10-CM | POA: Diagnosis not present

## 2016-10-07 DIAGNOSIS — R1011 Right upper quadrant pain: Secondary | ICD-10-CM | POA: Diagnosis not present

## 2016-10-07 DIAGNOSIS — K819 Cholecystitis, unspecified: Secondary | ICD-10-CM | POA: Diagnosis not present

## 2016-10-07 DIAGNOSIS — E663 Overweight: Secondary | ICD-10-CM | POA: Diagnosis present

## 2016-10-07 DIAGNOSIS — T402X5A Adverse effect of other opioids, initial encounter: Secondary | ICD-10-CM | POA: Diagnosis not present

## 2016-10-07 DIAGNOSIS — E785 Hyperlipidemia, unspecified: Secondary | ICD-10-CM | POA: Diagnosis present

## 2016-10-07 DIAGNOSIS — Z96653 Presence of artificial knee joint, bilateral: Secondary | ICD-10-CM | POA: Diagnosis present

## 2016-10-07 DIAGNOSIS — Z950 Presence of cardiac pacemaker: Secondary | ICD-10-CM | POA: Diagnosis not present

## 2016-10-07 DIAGNOSIS — F05 Delirium due to known physiological condition: Secondary | ICD-10-CM | POA: Diagnosis not present

## 2016-10-07 DIAGNOSIS — Z79899 Other long term (current) drug therapy: Secondary | ICD-10-CM | POA: Diagnosis not present

## 2016-10-07 DIAGNOSIS — I248 Other forms of acute ischemic heart disease: Secondary | ICD-10-CM | POA: Diagnosis present

## 2016-10-07 DIAGNOSIS — Z791 Long term (current) use of non-steroidal anti-inflammatories (NSAID): Secondary | ICD-10-CM | POA: Diagnosis not present

## 2016-10-07 LAB — COMPREHENSIVE METABOLIC PANEL
ALT: 18 U/L (ref 14–54)
ANION GAP: 19 — AB (ref 5–15)
AST: 27 U/L (ref 15–41)
Albumin: 3.9 g/dL (ref 3.5–5.0)
Alkaline Phosphatase: 35 U/L — ABNORMAL LOW (ref 38–126)
BUN: 12 mg/dL (ref 6–20)
CHLORIDE: 92 mmol/L — AB (ref 101–111)
CO2: 20 mmol/L — AB (ref 22–32)
CREATININE: 0.8 mg/dL (ref 0.44–1.00)
Calcium: 9.2 mg/dL (ref 8.9–10.3)
Glucose, Bld: 245 mg/dL — ABNORMAL HIGH (ref 65–99)
POTASSIUM: 3.7 mmol/L (ref 3.5–5.1)
SODIUM: 131 mmol/L — AB (ref 135–145)
Total Bilirubin: 1.2 mg/dL (ref 0.3–1.2)
Total Protein: 7.6 g/dL (ref 6.5–8.1)

## 2016-10-07 LAB — BASIC METABOLIC PANEL
ANION GAP: 13 (ref 5–15)
ANION GAP: 14 (ref 5–15)
ANION GAP: 18 — AB (ref 5–15)
BUN: 10 mg/dL (ref 6–20)
BUN: 14 mg/dL (ref 6–20)
BUN: 9 mg/dL (ref 6–20)
CALCIUM: 8.2 mg/dL — AB (ref 8.9–10.3)
CALCIUM: 8.4 mg/dL — AB (ref 8.9–10.3)
CALCIUM: 9.7 mg/dL (ref 8.9–10.3)
CO2: 20 mmol/L — AB (ref 22–32)
CO2: 20 mmol/L — AB (ref 22–32)
CO2: 21 mmol/L — AB (ref 22–32)
CREATININE: 0.76 mg/dL (ref 0.44–1.00)
Chloride: 101 mmol/L (ref 101–111)
Chloride: 101 mmol/L (ref 101–111)
Chloride: 93 mmol/L — ABNORMAL LOW (ref 101–111)
Creatinine, Ser: 0.82 mg/dL (ref 0.44–1.00)
Creatinine, Ser: 0.7 mg/dL (ref 0.44–1.00)
GFR calc Af Amer: >60 mL/min (ref >60)
GFR calc Af Amer: >60 mL/min (ref >60)
GFR calc Af Amer: >60 mL/min (ref >60)
GFR calc non Af Amer: >60 mL/min (ref >60)
GFR calc non Af Amer: >60 mL/min (ref >60)
GFR calc non Af Amer: >60 mL/min (ref >60)
GLUCOSE: 226 mg/dL — AB (ref 65–99)
GLUCOSE: 200 mg/dL — AB (ref 65–99)
Glucose, Bld: 271 mg/dL — ABNORMAL HIGH (ref 65–99)
Potassium: 3.5 mmol/L (ref 3.5–5.1)
Potassium: 3.6 mmol/L (ref 3.5–5.1)
Potassium: 3.7 mmol/L (ref 3.5–5.1)
Sodium: 132 mmol/L — ABNORMAL LOW (ref 135–145)
Sodium: 134 mmol/L — ABNORMAL LOW (ref 135–145)
Sodium: 135 mmol/L (ref 135–145)

## 2016-10-07 LAB — URINALYSIS, ROUTINE W REFLEX MICROSCOPIC
BILIRUBIN URINE: NEGATIVE
Glucose, UA: >=500 mg/dL — AB
KETONES UR: 20 mg/dL — AB
Nitrite: NEGATIVE
Protein, ur: 100 mg/dL — AB
SPECIFIC GRAVITY, URINE: 1.032 — AB (ref 1.005–1.030)
pH: 6 (ref 5.0–8.0)

## 2016-10-07 LAB — ECHOCARDIOGRAM COMPLETE
Height: 62 in
Weight: 2550.28 oz

## 2016-10-07 LAB — CBG MONITORING, ED
Glucose-Capillary: 227 mg/dL — ABNORMAL HIGH (ref 65–99)
Glucose-Capillary: 222 mg/dL — ABNORMAL HIGH (ref 65–99)

## 2016-10-07 LAB — GLUCOSE, CAPILLARY
GLUCOSE-CAPILLARY: 187 mg/dL — AB (ref 65–99)
Glucose-Capillary: 232 mg/dL — ABNORMAL HIGH (ref 65–99)
Glucose-Capillary: 203 mg/dL — ABNORMAL HIGH (ref 65–99)
Glucose-Capillary: 204 mg/dL — ABNORMAL HIGH (ref 65–99)

## 2016-10-07 LAB — CBC
HCT: 40.9 % (ref 36.0–46.0)
HEMOGLOBIN: 13.9 g/dL (ref 12.0–15.0)
MCH: 28.5 pg (ref 26.0–34.0)
MCHC: 34 g/dL (ref 30.0–36.0)
MCV: 84 fL (ref 78.0–100.0)
PLATELETS: 252 10*3/uL (ref 150–400)
RBC: 4.87 MIL/uL (ref 3.87–5.11)
RDW: 12.8 % (ref 11.5–15.5)
WBC: 14.4 10*3/uL — AB (ref 4.0–10.5)

## 2016-10-07 LAB — TROPONIN I
TROPONIN I: 0.27 ng/mL — AB (ref <0.03)
TROPONIN I: 0.29 ng/mL — AB (ref <0.03)
TROPONIN I: 0.34 ng/mL — AB (ref <0.03)

## 2016-10-07 LAB — INFLUENZA PANEL BY PCR (TYPE A & B)
Influenza A By PCR: NEGATIVE
Influenza B By PCR: NEGATIVE

## 2016-10-07 LAB — MRSA PCR SCREENING: MRSA by PCR: NEGATIVE

## 2016-10-07 LAB — LIPASE, BLOOD: Lipase: <10 U/L — ABNORMAL LOW (ref 11–51)

## 2016-10-07 MED ORDER — ACETAMINOPHEN 500 MG PO TABS
500.0000 mg | ORAL_TABLET | Freq: Four times a day (QID) | ORAL | Status: DC | PRN
  Administered 2016-10-08 – 2016-10-11 (×4): 500 mg via ORAL
  Filled 2016-10-07 (×4): qty 1

## 2016-10-07 MED ORDER — ASPIRIN EC 81 MG PO TBEC: 81.0000 mg | DELAYED_RELEASE_TABLET | Freq: Every day | ORAL | Status: DC

## 2016-10-07 MED ORDER — SODIUM CHLORIDE 0.9% FLUSH
3.0000 mL | Freq: Two times a day (BID) | INTRAVENOUS | Status: DC
  Administered 2016-10-07 – 2016-10-12 (×12): 3 mL via INTRAVENOUS

## 2016-10-07 MED ORDER — ALBUTEROL SULFATE (2.5 MG/3ML) 0.083% IN NEBU: 2.5000 mg | INHALATION_SOLUTION | RESPIRATORY_TRACT | Status: DC | PRN

## 2016-10-07 MED ORDER — INSULIN ASPART 100 UNIT/ML ~~LOC~~ SOLN
0.0000 [IU] | SUBCUTANEOUS | Status: DC
  Administered 2016-10-07: 2 [IU] via SUBCUTANEOUS
  Administered 2016-10-07 – 2016-10-08 (×6): 3 [IU] via SUBCUTANEOUS
  Administered 2016-10-08 (×2): 1 [IU] via SUBCUTANEOUS
  Administered 2016-10-08: 3 [IU] via SUBCUTANEOUS
  Administered 2016-10-08: 2 [IU] via SUBCUTANEOUS
  Administered 2016-10-08: 3 [IU] via SUBCUTANEOUS
  Administered 2016-10-09: 2 [IU] via SUBCUTANEOUS
  Administered 2016-10-09: 1 [IU] via SUBCUTANEOUS
  Administered 2016-10-09: 2 [IU] via SUBCUTANEOUS
  Administered 2016-10-09 – 2016-10-10 (×2): 1 [IU] via SUBCUTANEOUS
  Administered 2016-10-10: 2 [IU] via SUBCUTANEOUS
  Administered 2016-10-10 (×2): 1 [IU] via SUBCUTANEOUS
  Administered 2016-10-10: 5 [IU] via SUBCUTANEOUS
  Administered 2016-10-11 (×4): 2 [IU] via SUBCUTANEOUS
  Administered 2016-10-12 (×2): 1 [IU] via SUBCUTANEOUS
  Administered 2016-10-12: 2 [IU] via SUBCUTANEOUS
  Filled 2016-10-07 (×2): qty 1

## 2016-10-07 MED ORDER — PIPERACILLIN-TAZOBACTAM 3.375 G IVPB
3.3750 g | Freq: Three times a day (TID) | INTRAVENOUS | Status: DC
  Administered 2016-10-07 – 2016-10-12 (×15): 3.375 g via INTRAVENOUS
  Filled 2016-10-07 (×17): qty 50

## 2016-10-07 MED ORDER — SODIUM CHLORIDE 0.9 % IV SOLN
INTRAVENOUS | Status: DC
  Administered 2016-10-07 – 2016-10-09 (×6): via INTRAVENOUS

## 2016-10-07 MED ORDER — MORPHINE SULFATE (PF) 4 MG/ML IV SOLN
INTRAVENOUS | Status: AC
  Administered 2016-10-07: 3 mg via INTRAVENOUS
  Filled 2016-10-07: qty 1

## 2016-10-07 MED ORDER — NABUMETONE 500 MG PO TABS: 500.0000 mg | ORAL_TABLET | Freq: Two times a day (BID) | ORAL | Status: DC | PRN

## 2016-10-07 MED ORDER — METOPROLOL TARTRATE 5 MG/5ML IV SOLN
5.0000 mg | INTRAVENOUS | Status: DC | PRN
  Filled 2016-10-07: qty 5

## 2016-10-07 MED ORDER — MORPHINE SULFATE (PF) 4 MG/ML IV SOLN
3.0000 mg | Freq: Once | INTRAVENOUS | Status: AC
  Administered 2016-10-07: 3 mg via INTRAVENOUS

## 2016-10-07 MED ORDER — MORPHINE SULFATE (PF) 4 MG/ML IV SOLN
2.0000 mg | INTRAVENOUS | Status: DC | PRN
  Administered 2016-10-07 – 2016-10-10 (×10): 2 mg via INTRAVENOUS
  Filled 2016-10-07 (×10): qty 1

## 2016-10-07 MED ORDER — TECHNETIUM TC 99M MEBROFENIN IV KIT
5.2000 | PACK | Freq: Once | INTRAVENOUS | Status: AC | PRN
  Administered 2016-10-07: 5.2 via INTRAVENOUS

## 2016-10-07 MED ORDER — ENOXAPARIN SODIUM 40 MG/0.4ML ~~LOC~~ SOLN
40.0000 mg | Freq: Every day | SUBCUTANEOUS | Status: DC
  Administered 2016-10-07 – 2016-10-12 (×5): 40 mg via SUBCUTANEOUS
  Filled 2016-10-07 (×5): qty 0.4

## 2016-10-07 NOTE — Progress Notes (Signed)
Pharmacy Antibiotic Note  Maria [name] is a 83 y.o. female admitted on 10/06/2016 with cholecystitis.  Pharmacy has been consulted for Zosyn dosing.  Zosyn given in ED ~0115  Plan: Zosyn 3.375gm IV q8h - doses over 4 hours Will f/u micro data, renal function, and pt's clinical condition     Temp (24hrs), Avg:98.7 F (37.1 C), Min:98.7 F (37.1 C), Max:98.7 F (37.1 C)   Recent Labs Lab 10/06/16 1828 10/06/16 2124 10/06/16 2241 10/06/16 2335 10/06/16 2355  WBC 10.4  --   --   --   --   CREATININE 0.81  --  0.60  --  0.76  LATICACIDVEN  --  3.21*  --  3.85*  --     CrCl cannot be calculated (Unknown ideal weight.).    No Known Allergies  Antimicrobials this admission: 1/25 Zosyn >>   Microbiology results: 1/25 BCx:  1/25 UCx:    Thank you for allowing pharmacy to be a part of this patient's care.  Sherlon Handing, PharmD, BCPS Clinical pharmacist, pager [phone] 10/07/2016 1:39 AM

## 2016-10-07 NOTE — Progress Notes (Signed)
Family requesting another update by MD. Dr. Sloan Leiter came to bedside and updated pt and family. Family agreeable with care and MD answered all questions asked.

## 2016-10-07 NOTE — ED Notes (Signed)
Second IV attempted x 2 without success  

## 2016-10-07 NOTE — ED Notes (Signed)
Unable to locate second IV; second RN to attempt

## 2016-10-07 NOTE — Progress Notes (Signed)
Inpatient Diabetes Program Recommendations  AACE/ADA: New Consensus Statement on Inpatient Glycemic Control (2015)  Target Ranges:  Prepandial:   less than 140 mg/dL      Peak postprandial:   less than 180 mg/dL (1-2 hours)      Critically ill patients:  140 - 180 mg/dL   Lab Results  Component Value Date   GLUCAP 187 (H) 10/07/2016   HGBA1C 8.2 (H) 09/27/2014    Review of Glycemic Control  Diabetes history: DM2 Outpatient Diabetes medications: Metformin 500 mg BID Current orders for Inpatient glycemic control: Sensitive correction scale Novolog 0-9 units Q4H, NPO  Inpatient Diabetes Program Recommendations, please consider:        Per ADA recommendations "consider performing an A1C on all patients with diabetes or hyperglycemia admitted to the hospital if not performed in the prior 3 months".  Thank you,  Windy Carina, RN, MSN Diabetes Coordinator Inpatient Diabetes Program [phone] (Team Pager)

## 2016-10-07 NOTE — Consult Note (Signed)
Reason for Consult:Acalculous cholecystitis Referring Physician: Scharlene [name] is an 83 y.o. female.  HPI: Patient has not been feeling well over the last several days with persistent nausea, vomiting, and abdominal pain.  No fevers or chills. No jaundice.  No prior abdominal surgery.  Past Medical History:  Diagnosis Date  . Anxiety   . Arthritis   . Cancer ([place])    breast 2015  . Cardiac pacemaker in situ   . Chest pain   . Diabetes mellitus without complication ([place])   . DM type 2 (diabetes mellitus, type 2) ([place])   . Dyslipidemia   . Hypertension   . Type II or unspecified type diabetes mellitus without mention of complication, not stated as uncontrolled     Past Surgical History:  Procedure Laterality Date  . CATARACT EXTRACTION    . KNEE SURGERY Bilateral   . PACEMAKER INSERTION     [employer], [facility] 2003  . PACEMAKER INSERTION    . TOTAL KNEE ARTHROPLASTY Bilateral     Family History  Problem Relation Age of Onset  . Colon cancer Neg Hx     Social History:  reports that she has never smoked. She has never used smokeless tobacco. She reports that she does not drink alcohol or use drugs.  Allergies: No Known Allergies  Medications:  Prior to Admission:  Prescriptions Prior to Admission  Medication Sig Dispense Refill Last Dose  . acetaminophen (TYLENOL) 500 MG tablet Take 500 mg by mouth every 6 (six) hours as needed for mild pain.   10/05/2016 at Unknown time  . aspirin EC 81 MG tablet Take 81 mg by mouth daily.   10/05/2016 at Unknown time  . Calcium-Magnesium-Vitamin D 220-254-270 MG-MG-UNIT TABS Take 1 tablet by mouth daily.   10/05/2016 at Unknown time  . diclofenac (VOLTAREN) 75 MG EC tablet Take 1 tablet by mouth 2 (two) times daily as needed.   Past Week at Unknown time  . losartan-hydrochlorothiazide (HYZAAR) 50-12.5 MG per tablet Take 1 tablet by mouth daily.   10/05/2016 at Unknown time  . meloxicam (MOBIC) 15 MG tablet Take 15 mg by mouth  daily.  12 10/05/2016 at Unknown time  . metFORMIN (GLUCOPHAGE) 500 MG tablet Take 1 tablet by mouth 2 (two) times daily.   10/05/2016 at Unknown time  . Multiple Vitamin (MULTIVITAMIN WITH MINERALS) TABS tablet Take 1 tablet by mouth daily.   10/05/2016 at Unknown time  . nabumetone (RELAFEN) 500 MG tablet Take 500 mg by mouth 2 (two) times daily as needed for moderate pain. Reported on 11/20/2015  1 10/05/2016 at Unknown time  . OVER THE COUNTER MEDICATION Place 2 drops into both eyes daily as needed (for dry eyes).   10/05/2016 at Unknown time  . Polyvinyl Alcohol-Povidone (REFRESH OP) Place 1 drop into both eyes 2 (two) times daily as needed (for dry eyes).   10/05/2016 at Unknown time  . potassium chloride SA (K-DUR,KLOR-CON) 20 MEQ tablet Take 20 mEq by mouth daily.   10/05/2016 at Unknown time  . valACYclovir (VALTREX) 1000 MG tablet Take 1 g by mouth every 12 (twelve) hours as needed. For outbreaks  0 Past Week at Unknown time  . Blood Glucose Monitoring Suppl (FREESTYLE LITE) DEVI   0 Taking  . FREESTYLE LITE test strip   4 Taking  . Lancets (FREESTYLE) lancets   4 Taking    Results for orders placed or performed during the hospital encounter of 10/06/16 (from the past 48  hour(s))  Basic metabolic panel     Status: Abnormal   Collection Time: 10/06/16  6:28 PM  Result Value Ref Range   Sodium 132 (L) 135 - 145 mmol/L   Potassium 3.8 3.5 - 5.1 mmol/L   Chloride 93 (L) 101 - 111 mmol/L   CO2 21 (L) 22 - 32 mmol/L   Glucose, Bld 291 (H) 65 - 99 mg/dL   BUN 13 6 - 20 mg/dL   Creatinine, Ser 0.81 0.44 - 1.00 mg/dL   Calcium 10.2 8.9 - 10.3 mg/dL   GFR calc non Af Amer >60 >60 mL/min   GFR calc Af Amer >60 >60 mL/min    Comment: (NOTE) The eGFR has been calculated using the CKD EPI equation. This calculation has not been validated in all clinical situations. eGFR's persistently <60 mL/min signify possible Chronic Kidney Disease.    Anion gap 18 (H) 5 - 15  CBC     Status: Abnormal    Collection Time: 10/06/16  6:28 PM  Result Value Ref Range   WBC 10.4 4.0 - 10.5 K/uL   RBC 5.21 (H) 3.87 - 5.11 MIL/uL   Hemoglobin 15.1 (H) 12.0 - 15.0 g/dL   HCT 43.5 36.0 - 46.0 %   MCV 83.5 78.0 - 100.0 fL   MCH 29.0 26.0 - 34.0 pg   MCHC 34.7 30.0 - 36.0 g/dL   RDW 12.6 11.5 - 15.5 %   Platelets 244 150 - 400 K/uL  I-stat troponin, ED     Status: None   Collection Time: 10/06/16  6:35 PM  Result Value Ref Range   Troponin i, poc 0.06 0.00 - 0.08 ng/mL   Comment 3            Comment: Due to the release kinetics of cTnI, a negative result within the first hours of the onset of symptoms does not rule out myocardial infarction with certainty. If myocardial infarction is still suspected, repeat the test at appropriate intervals.   CBG monitoring, ED     Status: Abnormal   Collection Time: 10/06/16  9:17 PM  Result Value Ref Range   Glucose-Capillary 268 (H) 65 - 99 mg/dL  Beta-hydroxybutyric acid     Status: Abnormal   Collection Time: 10/06/16  9:18 PM  Result Value Ref Range   Beta-Hydroxybutyric Acid 1.06 (H) 0.05 - 0.27 mmol/L  I-Stat venous blood gas, ED     Status: Abnormal   Collection Time: 10/06/16  9:23 PM  Result Value Ref Range   pH, Ven 7.509 (H) 7.250 - 7.430   pCO2, Ven 31.9 (L) 44.0 - 60.0 mmHg   pO2, Ven 44.0 32.0 - 45.0 mmHg   Bicarbonate 25.4 20.0 - 28.0 mmol/L   TCO2 26 0 - 100 mmol/L   O2 Saturation 85.0 %   Acid-Base Excess 3.0 (H) 0.0 - 2.0 mmol/L   Patient temperature HIDE    Sample type VENOUS   I-Stat CG4 Lactic Acid, ED     Status: Abnormal   Collection Time: 10/06/16  9:24 PM  Result Value Ref Range   Lactic Acid, Venous 3.21 (HH) 0.5 - 1.9 mmol/L   Comment NOTIFIED PHYSICIAN   I-Stat Chem 8, ED     Status: Abnormal   Collection Time: 10/06/16 10:41 PM  Result Value Ref Range   Sodium 136 135 - 145 mmol/L   Potassium 3.4 (L) 3.5 - 5.1 mmol/L   Chloride 93 (L) 101 - 111 mmol/L   BUN 16 6 -  20 mg/dL   Creatinine, Ser 0.60 0.44 - 1.00  mg/dL   Glucose, Bld 268 (H) 65 - 99 mg/dL   Calcium, Ion 1.08 (L) 1.15 - 1.40 mmol/L   TCO2 25 0 - 100 mmol/L   Hemoglobin 16.3 (H) 12.0 - 15.0 g/dL   HCT 48.0 (H) 36.0 - 46.0 %  POC CBG, ED     Status: Abnormal   Collection Time: 10/06/16 11:28 PM  Result Value Ref Range   Glucose-Capillary 258 (H) 65 - 99 mg/dL  I-Stat Troponin, ED - 0, 3, 6 hours (not at [facility])     Status: Abnormal   Collection Time: 10/06/16 11:33 PM  Result Value Ref Range   Troponin i, poc 0.18 (HH) 0.00 - 0.08 ng/mL   Comment NOTIFIED PHYSICIAN    Comment 3            Comment: Due to the release kinetics of cTnI, a negative result within the first hours of the onset of symptoms does not rule out myocardial infarction with certainty. If myocardial infarction is still suspected, repeat the test at appropriate intervals.   I-Stat CG4 Lactic Acid, ED     Status: Abnormal   Collection Time: 10/06/16 11:35 PM  Result Value Ref Range   Lactic Acid, Venous 3.85 (HH) 0.5 - 1.9 mmol/L   Comment NOTIFIED PHYSICIAN   Basic metabolic panel     Status: Abnormal   Collection Time: 10/06/16 11:55 PM  Result Value Ref Range   Sodium 132 (L) 135 - 145 mmol/L   Potassium 3.7 3.5 - 5.1 mmol/L   Chloride 93 (L) 101 - 111 mmol/L   CO2 21 (L) 22 - 32 mmol/L   Glucose, Bld 271 (H) 65 - 99 mg/dL   BUN 14 6 - 20 mg/dL   Creatinine, Ser 0.76 0.44 - 1.00 mg/dL   Calcium 9.7 8.9 - 10.3 mg/dL   GFR calc non Af Amer >60 >60 mL/min   GFR calc Af Amer >60 >60 mL/min    Comment: (NOTE) The eGFR has been calculated using the CKD EPI equation. This calculation has not been validated in all clinical situations. eGFR's persistently <60 mL/min signify possible Chronic Kidney Disease.    Anion gap 18 (H) 5 - 15  Urinalysis, Routine w reflex microscopic     Status: Abnormal   Collection Time: 10/07/16 12:34 AM  Result Value Ref Range   Color, Urine YELLOW YELLOW   APPearance HAZY (A) CLEAR   Specific Gravity, Urine 1.032 (H) 1.005  - 1.030   pH 6.0 5.0 - 8.0   Glucose, UA >=500 (A) NEGATIVE mg/dL   Hgb urine dipstick SMALL (A) NEGATIVE   Bilirubin Urine NEGATIVE NEGATIVE   Ketones, ur 20 (A) NEGATIVE mg/dL   Protein, ur 100 (A) NEGATIVE mg/dL   Nitrite NEGATIVE NEGATIVE   Leukocytes, UA TRACE (A) NEGATIVE   RBC / HPF 0-5 0 - 5 RBC/hpf   WBC, UA TOO NUMEROUS TO COUNT 0 - 5 WBC/hpf   Bacteria, UA RARE (A) NONE SEEN   Squamous Epithelial / LPF 0-5 (A) NONE SEEN   Mucous PRESENT   POC CBG, ED     Status: Abnormal   Collection Time: 10/07/16  1:29 AM  Result Value Ref Range   Glucose-Capillary 227 (H) 65 - 99 mg/dL  Lipase, blood     Status: Abnormal   Collection Time: 10/07/16  1:30 AM  Result Value Ref Range   Lipase <10 (L) 11 - 51 U/L  Comprehensive metabolic panel     Status: Abnormal   Collection Time: 10/07/16  1:30 AM  Result Value Ref Range   Sodium 131 (L) 135 - 145 mmol/L   Potassium 3.7 3.5 - 5.1 mmol/L   Chloride 92 (L) 101 - 111 mmol/L   CO2 20 (L) 22 - 32 mmol/L   Glucose, Bld 245 (H) 65 - 99 mg/dL   BUN 12 6 - 20 mg/dL   Creatinine, Ser 0.80 0.44 - 1.00 mg/dL   Calcium 9.2 8.9 - 10.3 mg/dL   Total Protein 7.6 6.5 - 8.1 g/dL   Albumin 3.9 3.5 - 5.0 g/dL   AST 27 15 - 41 U/L   ALT 18 14 - 54 U/L   Alkaline Phosphatase 35 (L) 38 - 126 U/L   Total Bilirubin 1.2 0.3 - 1.2 mg/dL   GFR calc non Af Amer >60 >60 mL/min   GFR calc Af Amer >60 >60 mL/min    Comment: (NOTE) The eGFR has been calculated using the CKD EPI equation. This calculation has not been validated in all clinical situations. eGFR's persistently <60 mL/min signify possible Chronic Kidney Disease.    Anion gap 19 (H) 5 - 15  CBC     Status: Abnormal   Collection Time: 10/07/16  1:30 AM  Result Value Ref Range   WBC 14.4 (H) 4.0 - 10.5 K/uL   RBC 4.87 3.87 - 5.11 MIL/uL   Hemoglobin 13.9 12.0 - 15.0 g/dL   HCT 40.9 36.0 - 46.0 %   MCV 84.0 78.0 - 100.0 fL   MCH 28.5 26.0 - 34.0 pg   MCHC 34.0 30.0 - 36.0 g/dL   RDW  12.8 11.5 - 15.5 %   Platelets 252 150 - 400 K/uL  Influenza panel by PCR (type A & B)     Status: None   Collection Time: 10/07/16  2:12 AM  Result Value Ref Range   Influenza A By PCR NEGATIVE NEGATIVE   Influenza B By PCR NEGATIVE NEGATIVE    Comment: (NOTE) The Xpert Xpress Flu assay is intended as an aid in the diagnosis of  influenza and should not be used as a sole basis for treatment.  This  assay is FDA approved for nasopharyngeal swab specimens only. Nasal  washings and aspirates are unacceptable for Xpert Xpress Flu testing.   Basic metabolic panel     Status: Abnormal   Collection Time: 10/07/16  4:05 AM  Result Value Ref Range   Sodium 134 (L) 135 - 145 mmol/L   Potassium 3.5 3.5 - 5.1 mmol/L   Chloride 101 101 - 111 mmol/L   CO2 20 (L) 22 - 32 mmol/L   Glucose, Bld 226 (H) 65 - 99 mg/dL   BUN 9 6 - 20 mg/dL   Creatinine, Ser 0.82 0.44 - 1.00 mg/dL   Calcium 8.2 (L) 8.9 - 10.3 mg/dL   GFR calc non Af Amer >60 >60 mL/min   GFR calc Af Amer >60 >60 mL/min    Comment: (NOTE) The eGFR has been calculated using the CKD EPI equation. This calculation has not been validated in all clinical situations. eGFR's persistently <60 mL/min signify possible Chronic Kidney Disease.    Anion gap 13 5 - 15  Troponin I (q 6hr x 3)     Status: Abnormal   Collection Time: 10/07/16  4:05 AM  Result Value Ref Range   Troponin I 0.27 (HH) <0.03 ng/mL    Comment: CRITICAL RESULT CALLED TO,  READ BACK BY AND VERIFIED WITH: OLDLAND,B RN 10/07/2016 0533 JORDANS   POC CBG, ED     Status: Abnormal   Collection Time: 10/07/16  4:05 AM  Result Value Ref Range   Glucose-Capillary 222 (H) 65 - 99 mg/dL  Basic metabolic panel     Status: Abnormal   Collection Time: 10/07/16  6:04 AM  Result Value Ref Range   Sodium 135 135 - 145 mmol/L   Potassium 3.6 3.5 - 5.1 mmol/L   Chloride 101 101 - 111 mmol/L   CO2 20 (L) 22 - 32 mmol/L   Glucose, Bld 200 (H) 65 - 99 mg/dL   BUN 10 6 - 20 mg/dL    Creatinine, Ser 0.70 0.44 - 1.00 mg/dL   Calcium 8.4 (L) 8.9 - 10.3 mg/dL   GFR calc non Af Amer >60 >60 mL/min   GFR calc Af Amer >60 >60 mL/min    Comment: (NOTE) The eGFR has been calculated using the CKD EPI equation. This calculation has not been validated in all clinical situations. eGFR's persistently <60 mL/min signify possible Chronic Kidney Disease.    Anion gap 14 5 - 15    Dg Chest 2 View  Result Date: 10/06/2016 CLINICAL DATA:  Mid chest pain for 2 days with shortness of breath EXAM: CHEST  2 VIEW COMPARISON:  04/04/2014 FINDINGS: Low volume film with stable asymmetric elevation right hemidiaphragm. The lungs are clear wiithout focal pneumonia, edema, pneumothorax or pleural effusion. Cardiopericardial silhouette is at upper limits of normal for size. Left-sided dual lead permanent pacemaker again noted. Degenerative changes are evident in the shoulders bilaterally. IMPRESSION: Stable.  No acute findings. Electronically Signed   By: Misty Stanley M.D.   On: 10/06/2016 18:53   Ct Abdomen Pelvis W Contrast  Result Date: 10/07/2016 CLINICAL DATA:  Chest pain with nausea and vomiting. EXAM: CT ABDOMEN AND PELVIS WITH CONTRAST TECHNIQUE: Multidetector CT imaging of the abdomen and pelvis was performed using the standard protocol following bolus administration of intravenous contrast. CONTRAST:  171m ISOVUE-300 IOPAMIDOL (ISOVUE-300) INJECTION 61% COMPARISON:  None. FINDINGS: Lower chest: Borderline cardiomegaly with right atrial and right ventricular pacing leads noted. No pericardial effusion. Bibasilar atelectasis. No pneumonic consolidation, effusion or pneumothorax. Hepatobiliary: Mild fatty infiltration of the liver without space-occupying mass. No biliary dilatation. Distended gallbladder with mild wall thickening up to 4 mm and pericholecystic fluid concerning for acalculus cholecystitis. No gallstones or choledocholithiasis. Pancreas: Unremarkable Spleen: No splenomegaly.  Small  splenule at the hilum. Adrenals/Urinary Tract: Normal bilateral adrenal glands. Tiny too small to further characterize 5 mm left upper abdominal and interpolar and lower pole hypodensity statistically consistent with cysts. No hydroureteronephrosis. Urinary bladder is physiologically distended. Stomach/Bowel: Nondistended stomach. Normal small bowel rotation. No acute bowel obstruction or inflammation. Vascular/Lymphatic: Moderate aortoiliac atherosclerosis. No aneurysm. No lymphadenopathy. Reproductive: Fibroid uterus with a noncalcified intramural 12 mm in diameter fibroid with partially calcified 16 mm left-sided subserosal fibroid. A few calcifications are noted intramural in etiology on the left which may also reflect tiny calcified fibroids. Other: No abdominal wall hernia or abnormality. No abdominopelvic ascites. Musculoskeletal: Lumbar degenerative disc disease L2 through L5 with small posterior marginal osteophytes. Degenerative joint space narrowing of both hips with spurring off the femoral head- neck junctures. No acute osseous abnormality. IMPRESSION: 1. Distended slightly thick-walled gallbladder with pericholecystic fluid concerning for acalculus cholecystitis. 2. Colonic diverticulosis without acute diverticulitis. 3. Fibroid uterus. 4. Subcentimeter left renal cysts too small for further characterize. Electronically Signed   By:  Ashley Royalty M.D.   On: 10/07/2016 00:23    Review of Systems  Constitutional: Negative for chills and fever.  HENT: Negative.   Eyes: Negative.   Respiratory: Negative.   Cardiovascular: Positive for chest pain (right costal margin).  Gastrointestinal: Positive for abdominal pain, nausea and vomiting. Negative for constipation and diarrhea.  Genitourinary: Negative.   Musculoskeletal: Negative.   Skin: Negative.   Neurological: Negative.   Endo/Heme/Allergies: Negative.   Psychiatric/Behavioral: Negative.    Blood pressure 122/74, pulse (!) 105, temperature  98.6 F (37 C), temperature source Oral, resp. rate (!) 22, height 5' 2" (1.575 m), weight 72.3 kg (159 lb 6.3 oz), SpO2 96 %. Physical Exam  Vitals reviewed. Constitutional: She is oriented to person, place, and time. She appears well-developed and well-nourished.  Looks younger than stated age  HENT:  Head: Normocephalic and atraumatic.  Eyes: EOM are normal. Pupils are equal, round, and reactive to light. No scleral icterus.  Neck: Normal range of motion. Neck supple.  Cardiovascular: Normal rate, regular rhythm and normal heart sounds.   Respiratory: Effort normal and breath sounds normal.  GI: Soft. She exhibits distension. She exhibits no shifting dullness. Bowel sounds are decreased. There is tenderness in the right upper quadrant and epigastric area. There is guarding and tenderness at McBurney's point. There is no rigidity and no rebound. No hernia.    Musculoskeletal: Normal range of motion.  Neurological: She is alert and oriented to person, place, and time.  Skin: Skin is warm and dry.  Psychiatric: She has a normal mood and affect. Her behavior is normal. Judgment and thought content normal.    Assessment/Plan: Possible acute acalculous cholecystitis with tenderness and possible palpable gallbladder in the RUQ.    Some medical co-morbidities such as DM, pacemaker in place (2003), and hypertension.  Only blood "thinner" is baby ASA daily.  Patient needs cardiac clearance prior to surgery.  However, if consideration is being given for immediate management a percutaneous cholecystostomy tube could be placed.  Outside of her age there is no other relative contraindication for urgent cholecystectomy.  IV antibiotics have been started and HIDA scan has been ordered.  We will follow with you.  Possible surgery next 24-48 hours.    10/07/2016, 7:27 AM

## 2016-10-07 NOTE — Consult Note (Signed)
Cardiology Consultation Note    Patient ID: Maria [name], MRN: [id number], DOB/AGE: 1924/05/23 83 y.o. Admit date: 10/06/2016   Date of Consult: 10/07/2016 Primary Physician: Donnie Coffin, MD Primary Cardiologist: Dr. Lovena Le  Chief Complaint: RUQ Pain  Reason for Consultation: Cardiac risk stratification  Requesting MD: Dr. Hulen   HPI: SHREEYA [name] is a 83 y.o. female with history of heart block p/o pacemaker implantation (2003), HTN, HLD, DM2 who was admitted to [facility] hospital with RUQ pain and vomiting for suspected acute cholecystitis. This was confirmed by CT which showed a distended thick walled gallbladder with pericholecystic fluid, and HIDA showed no gallbladder acitivity, even after morphine, suggesting acute cystic duct obstruction. Plan to undergo  lap chole, pending cardiac clearance.  During stay in hospital her Tn have been elevated 0.27, 0.29 and 0.34. ECG showed paced and tachy on telemetry. Echo 10/07/16 showed EF 50-55%, mild LVH, hypokinesis of apical myocardium and anteroseptal. G1DD. Ventricular septal flattending c/w RV pressure overload. Moderate TR. PA pressure 80mmHg. CXR stable with no acute findings. Last pacemaker check 1/12 and normal.   Patient activity level is consistent with ~1MET. She admits to dyspnea on exertion with minimal walking. She describes that she becomes winded, weak and tired and has to rest to allievate this. The patient also states she has a history of occassional chest discomfort/tightness with walking, which occurs retrosternally, and is relieved by rest, lasting ~75min in duration. The patient is able to do some ADL's, including laundry and loading her dishwasher however, she endorses becoming very sob, weak and tired, along with chest pressure after these activities as well. The patient denies being able to walk up a flight of stairs or for extended distances >88ft without becoming sob. The patient also complains of occassional orthopnea (1 pillow  increase) and leg swelling.   She has not smoked but admits to snuff use daily. Ocassional drinker (1 beer on special events). Denies prior history of MI, Atrial Fib/Flutter, CVA, or TIA. She is a non-insulin dependent diabetic managed with Metformin. HTN controlled by losartan-hctz combo however elevated on presentation (189/115). LDL <100 but taken 2014.  She does take 81mg  ASA daily. Advanced age, 78, is risk factor. Patient BMI 29.15, placing the patient as overweight.   Past Medical History:  Diagnosis Date  . Anxiety   . Arthritis   . Cancer ([place])    breast 2015  . Cardiac pacemaker in situ   . Chest pain   . Diabetes mellitus without complication ([place])   . DM type 2 (diabetes mellitus, type 2) ([place])   . Dyslipidemia   . Hypertension   . Type II or unspecified type diabetes mellitus without mention of complication, not stated as uncontrolled       Surgical History:  Past Surgical History:  Procedure Laterality Date  . CATARACT EXTRACTION    . KNEE SURGERY Bilateral   . PACEMAKER INSERTION     [employer], [facility] 2003  . PACEMAKER INSERTION    . TOTAL KNEE ARTHROPLASTY Bilateral      Home Meds: Prior to Admission medications   Medication Sig Start Date End Date Taking? Authorizing Provider  acetaminophen (TYLENOL) 500 MG tablet Take 500 mg by mouth every 6 (six) hours as needed for mild pain.   Yes Historical Provider, MD  aspirin EC 81 MG tablet Take 81 mg by mouth daily.   Yes Historical Provider, MD  Calcium-Magnesium-Vitamin D [id number] MG-MG-UNIT TABS Take 1 tablet by mouth daily.  Yes Historical Provider, MD  diclofenac (VOLTAREN) 75 MG EC tablet Take 1 tablet by mouth 2 (two) times daily as needed. 07/18/14  Yes Historical Provider, MD  losartan-hydrochlorothiazide (HYZAAR) 50-12.5 MG per tablet Take 1 tablet by mouth daily. 09/11/14  Yes Historical Provider, MD  meloxicam (MOBIC) 15 MG tablet Take 15 mg by mouth daily. 05/27/15  Yes Historical Provider, MD   metFORMIN (GLUCOPHAGE) 500 MG tablet Take 1 tablet by mouth 2 (two) times daily. 09/09/14  Yes Historical Provider, MD  Multiple Vitamin (MULTIVITAMIN WITH MINERALS) TABS tablet Take 1 tablet by mouth daily.   Yes Historical Provider, MD  nabumetone (RELAFEN) 500 MG tablet Take 500 mg by mouth 2 (two) times daily as needed for moderate pain. Reported on 11/20/2015 10/27/15  Yes Historical Provider, MD  [place] 2 drops into both eyes daily as needed (for dry eyes).   Yes Historical Provider, MD  Polyvinyl Alcohol-Povidone (REFRESH OP) Place 1 drop into both eyes 2 (two) times daily as needed (for dry eyes).   Yes Historical Provider, MD  potassium chloride SA (K-DUR,KLOR-CON) 20 MEQ tablet Take 20 mEq by mouth daily.   Yes Historical Provider, MD  valACYclovir (VALTREX) 1000 MG tablet Take 1 g by mouth every 12 (twelve) hours as needed. For outbreaks 10/27/15  Yes Historical Provider, MD  Blood Glucose Monitoring Suppl (FREESTYLE LITE) DEVI  08/28/14   Historical Provider, MD  FREESTYLE LITE test strip  08/28/14   Historical Provider, MD  Lancets (FREESTYLE) lancets  08/28/14   Historical Provider, MD    Inpatient Medications:  . enoxaparin (LOVENOX) injection  40 mg Subcutaneous Daily  . insulin aspart  0-9 Units Subcutaneous Q4H  . piperacillin-tazobactam (ZOSYN)  IV  3.375 g Intravenous Q8H  . sodium chloride flush  3 mL Intravenous Q12H   . sodium chloride 75 mL/hr at 10/07/16 1212    Allergies: No Known Allergies  Social History   Social History  . Marital status: Widowed    Spouse name: N/A  . Number of children: 2  . Years of education: N/A   Occupational History  . Not on file.   Social History Main Topics  . Smoking status: Never Smoker  . Smokeless tobacco: Never Used  . Alcohol use No  . Drug use: No  . Sexual activity: Not on file   Other Topics Concern  . Not on file   Social History Narrative   ** Merged History Encounter **          Family History  Problem Relation Age of Onset  . Colon cancer Neg Hx      Review of Systems: General: negative for chills, fever, night sweats or weight changes.  Cardiovascular: Admits to chest pain, sob, doe, orthopnea, edema. Negative for palpitations, paroxysmal nocturnal dyspnea Dermatological: negative for rash Respiratory: negative for cough or wheezing Urologic: negative for hematuria Abdominal: negative for nausea, vomiting, diarrhea, bright red blood per rectum, melena, or hematemesis Neurologic: negative for visual changes, syncope, or dizziness All other systems reviewed and are otherwise negative except as noted above.  Labs:  Recent Labs  10/07/16 0405 10/07/16 0604 10/07/16 1258  TROPONINI 0.27* 0.29* 0.34*   Lab Results  Component Value Date   WBC 14.4 (H) 10/07/2016   HGB 13.9 10/07/2016   HCT 40.9 10/07/2016   MCV 84.0 10/07/2016   PLT 252 10/07/2016    Recent Labs Lab 10/07/16 0130  10/07/16 0604  NA 131*  < > 135  K 3.7  < > 3.6  CL 92*  < > 101  CO2 20*  < > 20*  BUN 12  < > 10  CREATININE 0.80  < > 0.70  CALCIUM 9.2  < > 8.4*  PROT 7.6  --   --   BILITOT 1.2  --   --   ALKPHOS 35*  --   --   ALT 18  --   --   AST 27  --   --   GLUCOSE 245*  < > 200*  < > = values in this interval not displayed. Lab Results  Component Value Date   CHOL 138 05/20/2013   HDL 38 (L) 05/20/2013   LDLCALC 69 05/20/2013   TRIG 156 (H) 05/20/2013   Lab Results  Component Value Date   DDIMER 0.42 05/19/2013    Radiology/Studies:  Dg Chest 2 View  Result Date: 10/06/2016 CLINICAL DATA:  Mid chest pain for 2 days with shortness of breath EXAM: CHEST  2 VIEW COMPARISON:  04/04/2014 FINDINGS: Low volume film with stable asymmetric elevation right hemidiaphragm. The lungs are clear wiithout focal pneumonia, edema, pneumothorax or pleural effusion. Cardiopericardial silhouette is at upper limits of normal for size. Left-sided dual lead permanent pacemaker  again noted. Degenerative changes are evident in the shoulders bilaterally. IMPRESSION: Stable.  No acute findings. Electronically Signed   By: Misty Stanley M.D.   On: 10/06/2016 18:53   Nm Hepatobiliary Including Gb  Result Date: 10/07/2016 CLINICAL DATA:  Acute acalculous cholecystitis EXAM: NUCLEAR MEDICINE HEPATOBILIARY IMAGING TECHNIQUE: Sequential images of the abdomen were obtained out to 60 minutes following intravenous administration of radiopharmaceutical. RADIOPHARMACEUTICALS:  5.2 mCi Tc-48m  Choletec IV COMPARISON:  CT abdomen pelvis 10/06/2016 FINDINGS: There is good uptake by the liver. Small bowel activity is noted beginning at 10 minutes. No gallbladder activity was seen at 1 hour The patient received 2.0 mg of IV morphine in attempt to visualize the gallbladder. Imaging was obtained for another 33 minutes at which time the patient refused further imaging. No gallbladder activity was demonstrated post morphine administration. IMPRESSION: Negative for gallbladder activity including after morphine administration. Findings consistent with cystic duct obstruction. Electronically Signed   By: Franchot Gallo M.D.   On: 10/07/2016 11:25   Ct Abdomen Pelvis W Contrast  Result Date: 10/07/2016 CLINICAL DATA:  Chest pain with nausea and vomiting. EXAM: CT ABDOMEN AND PELVIS WITH CONTRAST TECHNIQUE: Multidetector CT imaging of the abdomen and pelvis was performed using the standard protocol following bolus administration of intravenous contrast. CONTRAST:  139mL ISOVUE-300 IOPAMIDOL (ISOVUE-300) INJECTION 61% COMPARISON:  None. FINDINGS: Lower chest: Borderline cardiomegaly with right atrial and right ventricular pacing leads noted. No pericardial effusion. Bibasilar atelectasis. No pneumonic consolidation, effusion or pneumothorax. Hepatobiliary: Mild fatty infiltration of the liver without space-occupying mass. No biliary dilatation. Distended gallbladder with mild wall thickening up to 4 mm and  pericholecystic fluid concerning for acalculus cholecystitis. No gallstones or choledocholithiasis. Pancreas: Unremarkable Spleen: No splenomegaly.  Small splenule at the hilum. Adrenals/Urinary Tract: Normal bilateral adrenal glands. Tiny too small to further characterize 5 mm left upper abdominal and interpolar and lower pole hypodensity statistically consistent with cysts. No hydroureteronephrosis. Urinary bladder is physiologically distended. Stomach/Bowel: Nondistended stomach. Normal small bowel rotation. No acute bowel obstruction or inflammation. Vascular/Lymphatic: Moderate aortoiliac atherosclerosis. No aneurysm. No lymphadenopathy. Reproductive: Fibroid uterus with a noncalcified intramural 12 mm in diameter fibroid with partially calcified 16 mm left-sided subserosal fibroid. A few calcifications are noted intramural  in etiology on the left which may also reflect tiny calcified fibroids. Other: No abdominal wall hernia or abnormality. No abdominopelvic ascites. Musculoskeletal: Lumbar degenerative disc disease L2 through L5 with small posterior marginal osteophytes. Degenerative joint space narrowing of both hips with spurring off the femoral head- neck junctures. No acute osseous abnormality. IMPRESSION: 1. Distended slightly thick-walled gallbladder with pericholecystic fluid concerning for acalculus cholecystitis. 2. Colonic diverticulosis without acute diverticulitis. 3. Fibroid uterus. 4. Subcentimeter left renal cysts too small for further characterize. Electronically Signed   By: Ashley Royalty M.D.   On: 10/07/2016 00:23    Wt Readings from Last 3 Encounters:  10/07/16 72.3 kg (159 lb 6.3 oz)  06/16/16 70.8 kg (156 lb)  11/20/15 63.2 kg (139 lb 6 oz)    EKG: SR, Paced, tachy. V paced. QTc prolonged 537. No significant changes from last EKG.   Physical Exam: Blood pressure 127/71, pulse 97, temperature 98.3 F (36.8 C), temperature source Oral, resp. rate (!) 31, height 5\' 2"  (1.575 m),  weight 72.3 kg (159 lb 6.3 oz), SpO2 91 %. Body mass index is 29.15 kg/m. General: Well developed, mild cachectic, in no acute distress with mild dyspnea. Head: Normocephalic, atraumatic, sclera non-icteric, no xanthomas, nares are without discharge.  Neck: Negative for carotid bruits. JVD mildly elevated. Lungs: Faint bilaterally. Breathing is unlabored. Heart: Normal heart sounds, S1 and S2. No murmurs.  Abdomen: Slightly distend. Reduced bowel sounds throughout. Tenderness to RUQ and epigastric area with gaurding. No rebound. Msk:  Strength and tone appear normal for age. Extremities: No clubbing or cyanosis. No edema.  Distal pedal pulses are 2+ and equal bilaterally. Neuro: Alert and oriented X 3. No facial asymmetry. No focal deficit. Moves all extremities spontaneously. Psych:  Responds to questions appropriately with a normal affect.    Assessment and Plan   Preop cardiac risk stratification: She does have concerning RF for underlying CAD, though has never had an ischemic work up in the past. Given her reports of exertional anginal and dyspnea that has increased over the past couple of months, increases her overall risk. Also noted elevated trop, though EKG is non acute. In light of her age, elevated trop and reported exertional symptoms would consider her high risk for surgery. Would want to avoid if at all possible.  -- Could consider medical management empirically for CAD with addition of low dose BB though may not be beneficial given her advanced age. Would not start ASA until decision is made regarding invasive therapy. No statin given age.  SignedReino Bellis NP-C 10/07/2016, 2:09 PM Pager: [phone]  Personally seen and examined. Agree with above.  83 year old with acute cholecystitis. Elevated troponin 0.27, possible exertional anginal symptoms, unable to climb a flight of stairs, severe pulmonary hypertension. She is high risk for surgery. Try to avoid general  anesthesia. Spoke to family about risk.  Consider low-dose beta blocker. No signs of heart failure.  Candee Furbish, MD

## 2016-10-07 NOTE — Progress Notes (Addendum)
PROGRESS NOTE        PATIENT DETAILS Name: Maria [name] Age: 83 y.o. Sex: female Date of Birth: 1924/08/06 Admit Date: 10/06/2016 Admitting Physician Etta Quill, DO ZY:[id number] Alroy Dust, MD  Brief Narrative: Patient is a 83 y.o. female with history of complete heart block-status post permanent pacemaker implantation, hypertension, type 2 diabetes admitted with mostly right upper quadrant pain, vomiting-thought to have acute acalculous cholecystitis. Patient was subsequently admitted for further evaluation and treatment  Subjective: Continues to have right upper quadrant pain-but appears comfortable.  Assessment/Plan: Right upper quadrant pain likely secondary to acute acalculous cholecystitis: Per patient-pain is worsened by oral intake, oral intake also precipitates vomiting. This a.m. continues to have tenderness in the right upper quadrant area. Plans are to continue empiric antibiotics. Evaluated by general surgery with plans for a HIDA scan and possible laparoscopic cholecystectomy the next few days. Have consulted cardiology for preop evaluation as recommended by general surgery.  Systemic inflammatory response syndrome: Likely secondary to above, hemodynamics remain stable-doubt sepsis syndrome. Continue antimicrobial therapy and IV fluids. Await blood cultures.  Newly elevated troponins: Trend is mostly flat-I suspect this is demand ischemia rather than ACS. He does not have chest pain-pain is mostly in the right upper quadrant abdominal area. EKG shows a paced rhythm. Check echo-doubt further workup is required-but at the request of general surgery-await cardiology evaluation  Asymptomatic bacteriuria: UA consistent with UTI-however patient symptoms are mostly consistent with cholecystitis. On antimicrobial therapy in any event that should cover, follow urine cultures.   Type 2 diabetes: CBGs relatively stable with SSI-continue to hold oral hypoglycemic  agents  Hypertension: Blood pressure relatively stable-continue to hold oral antihypertensives-agree with using as needed IV Lopressor for elevated blood pressure readings.  History of complete heart block-has permanent pacemaker in place  DVT Prophylaxis: Prophylactic Lovenox  Code Status: Full code  Family Communication: Daughter at bedside  Disposition Plan: Remain inpatient-may require on Home health vs SNF on discharge-will require several more day of hospitalization prior to d/c  Antimicrobial agents: Anti-infectives    Start     Dose/Rate Route Frequency Ordered Stop   10/07/16 1000  piperacillin-tazobactam (ZOSYN) IVPB 3.375 g     3.375 g 12.5 mL/hr over 240 Minutes Intravenous Every 8 hours 10/07/16 0143     10/06/16 2345  piperacillin-tazobactam (ZOSYN) IVPB 3.375 g     3.375 g 100 mL/hr over 30 Minutes Intravenous  Once 10/06/16 2341 10/07/16 0200   10/06/16 2345  vancomycin (VANCOCIN) IVPB 1000 mg/200 mL premix  Status:  Discontinued     1,000 mg 200 mL/hr over 60 Minutes Intravenous  Once 10/06/16 2341 10/07/16 0046      Procedures: None  CONSULTS:  cardiology and general surgery  Time spent: 25 minutes-Greater than 50% of this time was spent in counseling, explanation of diagnosis, planning of further management, and coordination of care.  MEDICATIONS: Scheduled Meds: . enoxaparin (LOVENOX) injection  40 mg Subcutaneous Daily  . insulin aspart  0-9 Units Subcutaneous Q4H  . piperacillin-tazobactam (ZOSYN)  IV  3.375 g Intravenous Q8H  . sodium chloride flush  3 mL Intravenous Q12H   Continuous Infusions: . sodium chloride 75 mL/hr at 10/07/16 0600   PRN Meds:.acetaminophen, metoprolol, morphine injection   PHYSICAL EXAM: Vital signs: Vitals:   10/07/16 0500 10/07/16 0530 10/07/16 0606 10/07/16 0745  BP: 143/90 150/88  122/74 129/60  Pulse: 107 107 (!) 105 (!) 109  Resp: (!) 29 23 (!) 22 20  Temp:   98.6 F (37 C) 98.9 F (37.2 C)  TempSrc:    Oral Oral  SpO2: 93% 94% 96% 91%  Weight:   72.3 kg (159 lb 6.3 oz)   Height:   5\' 2"  (1.575 m)    Filed Weights   10/07/16 0606  Weight: 72.3 kg (159 lb 6.3 oz)   Body mass index is 29.15 kg/m.   General appearance :Awake, alert, not in any distress. Speech Clear. Not toxic Looking Eyes:, pupils equally reactive to light and accomodation,no scleral icterus. HEENT: Atraumatic and Normocephalic Neck: supple, no JVD. No cervical lymphadenopathy. No thyromegaly Resp:Good air entry bilaterally, no added sounds  CVS: S1 S2 regular, no murmurs.  GI: Bowel sounds present,+ Tender RUQ area, with mild gaurding-but abd is soft.  Extremities: B/L Lower Ext shows no edema, both legs are warm to touch Neurology:  speech clear,Non focal, sensation is grossly intact. Psychiatric: Normal judgment and insight. Alert and oriented x 3. Normal mood. Musculoskeletal:No digital cyanosis Skin:No Rash, warm and dry Wounds:N/A  I have personally reviewed following labs and imaging studies  LABORATORY DATA: CBC:  Recent Labs Lab 10/06/16 1828 10/06/16 2241 10/07/16 0130  WBC 10.4  --  14.4*  HGB 15.1* 16.3* 13.9  HCT 43.5 48.0* 40.9  MCV 83.5  --  84.0  PLT 244  --  [id number]    Basic Metabolic Panel:  Recent Labs Lab 10/06/16 1828 10/06/16 2241 10/06/16 2355 10/07/16 0130 10/07/16 0405 10/07/16 0604  NA 132* 136 132* 131* 134* 135  K 3.8 3.4* 3.7 3.7 3.5 3.6  CL 93* 93* 93* 92* 101 101  CO2 21*  --  21* 20* 20* 20*  GLUCOSE 291* 268* 271* 245* 226* 200*  BUN 13 16 14 12 9 10   CREATININE 0.81 0.60 0.76 0.80 0.82 0.70  CALCIUM 10.2  --  9.7 9.2 8.2* 8.4*    GFR: Estimated Creatinine Clearance: 43.5 mL/min (by C-G formula based on SCr of 0.7 mg/dL).  Liver Function Tests:  Recent Labs Lab 10/07/16 0130  AST 27  ALT 18  ALKPHOS 35*  BILITOT 1.2  PROT 7.6  ALBUMIN 3.9    Recent Labs Lab 10/07/16 0130  LIPASE <10*   No results for input(s): AMMONIA in the last 168  hours.  Coagulation Profile: No results for input(s): INR, PROTIME in the last 168 hours.  Cardiac Enzymes:  Recent Labs Lab 10/07/16 0405 10/07/16 0604  TROPONINI 0.27* 0.29*    BNP (last 3 results) No results for input(s): PROBNP in the last 8760 hours.  HbA1C: No results for input(s): HGBA1C in the last 72 hours.  CBG:  Recent Labs Lab 10/06/16 2117 10/06/16 2328 10/07/16 0129 10/07/16 0405 10/07/16 0743  GLUCAP 268* 258* 227* 222* 187*    Lipid Profile: No results for input(s): CHOL, HDL, LDLCALC, TRIG, CHOLHDL, LDLDIRECT in the last 72 hours.  Thyroid Function Tests: No results for input(s): TSH, T4TOTAL, FREET4, T3FREE, THYROIDAB in the last 72 hours.  Anemia Panel: No results for input(s): VITAMINB12, FOLATE, FERRITIN, TIBC, IRON, RETICCTPCT in the last 72 hours.  Urine analysis:    Component Value Date/Time   COLORURINE YELLOW 10/07/2016 0034   APPEARANCEUR HAZY (A) 10/07/2016 0034   LABSPEC 1.032 (H) 10/07/2016 0034   PHURINE 6.0 10/07/2016 0034   GLUCOSEU >=500 (A) 10/07/2016 0034   HGBUR SMALL (A) 10/07/2016 0034  BILIRUBINUR NEGATIVE 10/07/2016 0034   KETONESUR 20 (A) 10/07/2016 0034   PROTEINUR 100 (A) 10/07/2016 0034   NITRITE NEGATIVE 10/07/2016 0034   LEUKOCYTESUR TRACE (A) 10/07/2016 0034    Sepsis Labs: Lactic Acid, Venous    Component Value Date/Time   LATICACIDVEN 3.85 ([place]) 10/06/2016 2335    MICROBIOLOGY: Recent Results (from the past 240 hour(s))  MRSA PCR Screening     Status: None   Collection Time: 10/07/16  6:03 AM  Result Value Ref Range Status   MRSA by PCR NEGATIVE NEGATIVE Final    Comment:        The GeneXpert MRSA Assay (FDA approved for NASAL specimens only), is one component of a comprehensive MRSA colonization surveillance program. It is not intended to diagnose MRSA infection nor to guide or monitor treatment for MRSA infections.     RADIOLOGY STUDIES/RESULTS: Dg Chest 2 View  Result Date:  10/06/2016 CLINICAL DATA:  Mid chest pain for 2 days with shortness of breath EXAM: CHEST  2 VIEW COMPARISON:  04/04/2014 FINDINGS: Low volume film with stable asymmetric elevation right hemidiaphragm. The lungs are clear wiithout focal pneumonia, edema, pneumothorax or pleural effusion. Cardiopericardial silhouette is at upper limits of normal for size. Left-sided dual lead permanent pacemaker again noted. Degenerative changes are evident in the shoulders bilaterally. IMPRESSION: Stable.  No acute findings. Electronically Signed   By: Misty Stanley M.D.   On: 10/06/2016 18:53   Ct Abdomen Pelvis W Contrast  Result Date: 10/07/2016 CLINICAL DATA:  Chest pain with nausea and vomiting. EXAM: CT ABDOMEN AND PELVIS WITH CONTRAST TECHNIQUE: Multidetector CT imaging of the abdomen and pelvis was performed using the standard protocol following bolus administration of intravenous contrast. CONTRAST:  138mL ISOVUE-300 IOPAMIDOL (ISOVUE-300) INJECTION 61% COMPARISON:  None. FINDINGS: Lower chest: Borderline cardiomegaly with right atrial and right ventricular pacing leads noted. No pericardial effusion. Bibasilar atelectasis. No pneumonic consolidation, effusion or pneumothorax. Hepatobiliary: Mild fatty infiltration of the liver without space-occupying mass. No biliary dilatation. Distended gallbladder with mild wall thickening up to 4 mm and pericholecystic fluid concerning for acalculus cholecystitis. No gallstones or choledocholithiasis. Pancreas: Unremarkable Spleen: No splenomegaly.  Small splenule at the hilum. Adrenals/Urinary Tract: Normal bilateral adrenal glands. Tiny too small to further characterize 5 mm left upper abdominal and interpolar and lower pole hypodensity statistically consistent with cysts. No hydroureteronephrosis. Urinary bladder is physiologically distended. Stomach/Bowel: Nondistended stomach. Normal small bowel rotation. No acute bowel obstruction or inflammation. Vascular/Lymphatic: Moderate  aortoiliac atherosclerosis. No aneurysm. No lymphadenopathy. Reproductive: Fibroid uterus with a noncalcified intramural 12 mm in diameter fibroid with partially calcified 16 mm left-sided subserosal fibroid. A few calcifications are noted intramural in etiology on the left which may also reflect tiny calcified fibroids. Other: No abdominal wall hernia or abnormality. No abdominopelvic ascites. Musculoskeletal: Lumbar degenerative disc disease L2 through L5 with small posterior marginal osteophytes. Degenerative joint space narrowing of both hips with spurring off the femoral head- neck junctures. No acute osseous abnormality. IMPRESSION: 1. Distended slightly thick-walled gallbladder with pericholecystic fluid concerning for acalculus cholecystitis. 2. Colonic diverticulosis without acute diverticulitis. 3. Fibroid uterus. 4. Subcentimeter left renal cysts too small for further characterize. Electronically Signed   By: Ashley Royalty M.D.   On: 10/07/2016 00:23     LOS: 0 days   Oren Binet, MD  [facility] Pager:[phone]  If 7PM-7AM, please contact night-coverage [email or web address] Password [facility] 10/07/2016, 10:31 AM

## 2016-10-07 NOTE — ED Notes (Signed)
Insulin drip stopped per Dr.Gardner. Dr.page at bedside for [place] IV

## 2016-10-07 NOTE — H&P (Signed)
History and Physical    Maria [name] [id number] DOB: 09/20/1923 DOA: 10/06/2016   PCP: Donnie Coffin, MD Chief Complaint:  Chief Complaint  Patient presents with  . Chest Pain    HPI: Maria [name] is a 83 y.o. female with medical history significant of pacemaker, DM2, HTN.  Patient presents to the ED with c/o N/V, chest pain and upper abdominal pain.  Symptoms onset on Monday.  Symptoms are persistent.  Chest pain is sharp, mild, and worsens with vomiting.  Pain does not radiate and does not worsen with exertion.  Has h/o pacemaker, but no CAD history.  Abd pain is crampy, worsens with vomiting, and worst in the RUQ.  ED Course: Trop initially negative, increases to 0.18.  EKG shows paced Rhythm, tachy to the 120s.  BP is running hypertensive to the A999333 systolic.  CT scan of abd/pelvis is suggestive of acute acalculous cholecystitis.  LFTs and Lipase are pending.  Lactate is 3.4.  Review of Systems: As per HPI otherwise 10 point review of systems negative.    Past Medical History:  Diagnosis Date  . Anxiety   . Arthritis   . Cancer ([place])    breast 2015  . Cardiac pacemaker in situ   . Chest pain   . Diabetes mellitus without complication ([place])   . DM type 2 (diabetes mellitus, type 2) ([place])   . Dyslipidemia   . Hypertension   . Type II or unspecified type diabetes mellitus without mention of complication, not stated as uncontrolled     Past Surgical History:  Procedure Laterality Date  . CATARACT EXTRACTION    . KNEE SURGERY Bilateral   . PACEMAKER INSERTION     [employer], [facility]  . PACEMAKER INSERTION    . TOTAL KNEE ARTHROPLASTY Bilateral      reports that she has never smoked. She has never used smokeless tobacco. She reports that she does not drink alcohol or use drugs.  No Known Allergies  Family History  Problem Relation Age of Onset  . Colon cancer Neg Hx       Prior to Admission medications   Medication Sig Start Date End Date Taking?  Authorizing Provider  acetaminophen (TYLENOL) 500 MG tablet Take 500 mg by mouth every 6 (six) hours as needed for mild pain.   Yes Historical Provider, MD  aspirin EC 81 MG tablet Take 81 mg by mouth daily.   Yes Historical Provider, MD  Calcium-Magnesium-Vitamin D [id number] MG-MG-UNIT TABS Take 1 tablet by mouth daily.   Yes Historical Provider, MD  diclofenac (VOLTAREN) 75 MG EC tablet Take 1 tablet by mouth 2 (two) times daily as needed. 07/18/14  Yes Historical Provider, MD  losartan-hydrochlorothiazide (HYZAAR) 50-12.5 MG per tablet Take 1 tablet by mouth daily. 09/11/14  Yes Historical Provider, MD  meloxicam (MOBIC) 15 MG tablet Take 15 mg by mouth daily. 05/27/15  Yes Historical Provider, MD  metFORMIN (GLUCOPHAGE) 500 MG tablet Take 1 tablet by mouth 2 (two) times daily. 09/09/14  Yes Historical Provider, MD  Multiple Vitamin (MULTIVITAMIN WITH MINERALS) TABS tablet Take 1 tablet by mouth daily.   Yes Historical Provider, MD  nabumetone (RELAFEN) 500 MG tablet Take 500 mg by mouth 2 (two) times daily as needed for moderate pain. Reported on 11/20/2015 10/27/15  Yes Historical Provider, MD  [place] 2 drops into both eyes daily as needed (for dry eyes).   Yes Historical Provider, MD  Polyvinyl Alcohol-Povidone ([place] [facility])  Place 1 drop into both eyes 2 (two) times daily as needed (for dry eyes).   Yes Historical Provider, MD  potassium chloride SA (K-DUR,KLOR-CON) 20 MEQ tablet Take 20 mEq by mouth daily.   Yes Historical Provider, MD  valACYclovir (VALTREX) 1000 MG tablet Take 1 g by mouth every 12 (twelve) hours as needed. For outbreaks 10/27/15  Yes Historical Provider, MD  Blood Glucose Monitoring Suppl (FREESTYLE LITE) DEVI  08/28/14   Historical Provider, MD  FREESTYLE LITE test strip  08/28/14   Historical Provider, MD  Lancets (FREESTYLE) lancets  08/28/14   Historical Provider, MD    Physical Exam: Vitals:   10/06/16 2030 10/06/16 2115 10/06/16 2145  10/06/16 2215  BP: (!) 181/116 (!) 196/110 189/98 172/92  Pulse: 106 109 109 116  Resp: 26 (!) 31 (!) 30 (!) 33  Temp:      TempSrc:      SpO2: 97% 94% 93% 96%      Constitutional: NAD, calm, comfortable Eyes: PERRL, lids and conjunctivae normal ENMT: Mucous membranes are moist. Posterior pharynx clear of any exudate or lesions.Normal dentition.  Neck: normal, supple, no masses, no thyromegaly Respiratory: clear to auscultation bilaterally, no wheezing, no crackles. Normal respiratory effort. No accessory muscle use.  Cardiovascular: Tachycardic, regular rhythm. Abdomen: Tenderness primarily in the RUQ. Musculoskeletal: no clubbing / cyanosis. No joint deformity upper and lower extremities. Good ROM, no contractures. Normal muscle tone.  Skin: no rashes, lesions, ulcers. No induration Neurologic: CN 2-12 grossly intact. Sensation intact, DTR normal. Strength 5/5 in all 4.  Psychiatric: Normal judgment and insight. Alert and oriented x 3. Normal mood.    Labs on Admission: I have personally reviewed following labs and imaging studies  CBC:  Recent Labs Lab 10/06/16 1828 10/06/16 2241  WBC 10.4  --   HGB 15.1* 16.3*  HCT 43.5 48.0*  MCV 83.5  --   PLT 244  --    Basic Metabolic Panel:  Recent Labs Lab 10/06/16 1828 10/06/16 2241 10/06/16 2355  NA 132* 136 132*  K 3.8 3.4* 3.7  CL 93* 93* 93*  CO2 21*  --  21*  GLUCOSE 291* 268* 271*  BUN 13 16 14   CREATININE 0.81 0.60 0.76  CALCIUM 10.2  --  9.7   GFR: CrCl cannot be calculated (Unknown ideal weight.). Liver Function Tests: No results for input(s): AST, ALT, ALKPHOS, BILITOT, PROT, ALBUMIN in the last 168 hours. No results for input(s): LIPASE, AMYLASE in the last 168 hours. No results for input(s): AMMONIA in the last 168 hours. Coagulation Profile: No results for input(s): INR, PROTIME in the last 168 hours. Cardiac Enzymes: No results for input(s): CKTOTAL, CKMB, CKMBINDEX, TROPONINI in the last 168  hours. BNP (last 3 results) No results for input(s): PROBNP in the last 8760 hours. HbA1C: No results for input(s): HGBA1C in the last 72 hours. CBG:  Recent Labs Lab 10/06/16 2117 10/06/16 2328  GLUCAP 268* 258*   Lipid Profile: No results for input(s): CHOL, HDL, LDLCALC, TRIG, CHOLHDL, LDLDIRECT in the last 72 hours. Thyroid Function Tests: No results for input(s): TSH, T4TOTAL, FREET4, T3FREE, THYROIDAB in the last 72 hours. Anemia Panel: No results for input(s): VITAMINB12, FOLATE, FERRITIN, TIBC, IRON, RETICCTPCT in the last 72 hours. Urine analysis:    Component Value Date/Time   COLORURINE YELLOW 11/20/2015 2117   APPEARANCEUR CLOUDY (A) 11/20/2015 2117   LABSPEC 1.009 11/20/2015 2117   PHURINE 5.5 11/20/2015 2117   GLUCOSEU NEGATIVE 11/20/2015 2117  HGBUR NEGATIVE 11/20/2015 2117   BILIRUBINUR NEGATIVE 11/20/2015 2117   [place] NEGATIVE 11/20/2015 2117   PROTEINUR NEGATIVE 11/20/2015 2117   NITRITE NEGATIVE 11/20/2015 2117   LEUKOCYTESUR NEGATIVE 11/20/2015 2117   Sepsis Labs: @LABRCNTIP (procalcitonin:4,lacticidven:4) )No results found for this or any previous visit (from the past 240 hour(s)).   Radiological Exams on Admission: Dg Chest 2 View  Result Date: 10/06/2016 CLINICAL DATA:  Mid chest pain for 2 days with shortness of breath EXAM: CHEST  2 VIEW COMPARISON:  04/04/2014 FINDINGS: Low volume film with stable asymmetric elevation right hemidiaphragm. The lungs are clear wiithout focal pneumonia, edema, pneumothorax or pleural effusion. Cardiopericardial silhouette is at upper limits of normal for size. Left-sided dual lead permanent pacemaker again noted. Degenerative changes are evident in the shoulders bilaterally. IMPRESSION: Stable.  No acute findings. Electronically Signed   By: Misty Stanley M.D.   On: 10/06/2016 18:53   Ct Abdomen Pelvis W Contrast  Result Date: 10/07/2016 CLINICAL DATA:  Chest pain with nausea and vomiting. EXAM: CT ABDOMEN AND  PELVIS WITH CONTRAST TECHNIQUE: Multidetector CT imaging of the abdomen and pelvis was performed using the standard protocol following bolus administration of intravenous contrast. CONTRAST:  190mL ISOVUE-300 IOPAMIDOL (ISOVUE-300) INJECTION 61% COMPARISON:  None. FINDINGS: Lower chest: Borderline cardiomegaly with right atrial and right ventricular pacing leads noted. No pericardial effusion. Bibasilar atelectasis. No pneumonic consolidation, effusion or pneumothorax. Hepatobiliary: Mild fatty infiltration of the liver without space-occupying mass. No biliary dilatation. Distended gallbladder with mild wall thickening up to 4 mm and pericholecystic fluid concerning for acalculus cholecystitis. No gallstones or choledocholithiasis. Pancreas: Unremarkable Spleen: No splenomegaly.  Small splenule at the hilum. Adrenals/Urinary Tract: Normal bilateral adrenal glands. Tiny too small to further characterize 5 mm left upper abdominal and interpolar and lower pole hypodensity statistically consistent with cysts. No hydroureteronephrosis. Urinary bladder is physiologically distended. Stomach/Bowel: Nondistended stomach. Normal small bowel rotation. No acute bowel obstruction or inflammation. Vascular/Lymphatic: Moderate aortoiliac atherosclerosis. No aneurysm. No lymphadenopathy. Reproductive: Fibroid uterus with a noncalcified intramural 12 mm in diameter fibroid with partially calcified 16 mm left-sided subserosal fibroid. A few calcifications are noted intramural in etiology on the left which may also reflect tiny calcified fibroids. Other: No abdominal wall hernia or abnormality. No abdominopelvic ascites. Musculoskeletal: Lumbar degenerative disc disease L2 through L5 with small posterior marginal osteophytes. Degenerative joint space narrowing of both hips with spurring off the femoral head- neck junctures. No acute osseous abnormality. IMPRESSION: 1. Distended slightly thick-walled gallbladder with pericholecystic  fluid concerning for acalculus cholecystitis. 2. Colonic diverticulosis without acute diverticulitis. 3. Fibroid uterus. 4. Subcentimeter left renal cysts too small for further characterize. Electronically Signed   By: Ashley Royalty M.D.   On: 10/07/2016 00:23    EKG: Independently reviewed.  Assessment/Plan Principal Problem:   Acute acalculous cholecystitis Active Problems:   Chest pain   HTN (hypertension)   DM type 2 (diabetes mellitus, type 2) (H[facility])   Elevated troponin   Lactic acidosis   Sinus tachycardia    1. Acute acalculous cholecystitis - 1. Zosyn per pharm 2. Dr. Hulen Skains to consult on patient 3. LFTs and Lipase pending 4. Repeat CBC and CMP in AM 5. NPO for now until surgery can eval 6. PRN zofran and morphine 2. Chest pain, elevated troponin - suspicious for demand ischemia in setting of hypertension and tachycardia is favored over primary plaque wall rupture ACS 1. Serial trops 2. Since patient is tachycardic (to 120s) and hypertensive (to A999333 systolic), will try and  treat with betablocker.  PRN metoprolol has been ordered 3. Does have pacer in place 3. DM2 - 1. Holding metformin 2. Sensitive scale SSI Q4H 4. Lactic acidosis - likely secondary to #1 1. Serial lactates 2. IVF 5. HTN - 1. PRN metoprolol 2. Holding lisinopril - HCTZ   DVT prophylaxis: Lovenox Code Status: Full Family Communication: Daughter at bedside Consults called: EDP spoke with Dr. Hulen Skains Admission status: Admit to inpatient   Etta Quill DO [facility] Pager [phone] from 7PM-7AM  If 7AM-7PM, please contact the day physician for the patient [email or web address] Password [facility]  10/07/2016, 12:59 AM

## 2016-10-07 NOTE — Progress Notes (Signed)
  Echocardiogram 2D Echocardiogram has been performed.  Darlina Sicilian M 10/07/2016, 11:46 AM

## 2016-10-08 ENCOUNTER — Encounter (HOSPITAL_COMMUNITY): Payer: Self-pay | Admitting: Interventional Radiology

## 2016-10-08 ENCOUNTER — Inpatient Hospital Stay (HOSPITAL_COMMUNITY): Payer: Medicare Other

## 2016-10-08 HISTORY — PX: IR GENERIC HISTORICAL: IMG1180011

## 2016-10-08 LAB — GLUCOSE, CAPILLARY
GLUCOSE-CAPILLARY: 147 mg/dL — AB (ref 65–99)
GLUCOSE-CAPILLARY: 189 mg/dL — AB (ref 65–99)
GLUCOSE-CAPILLARY: 207 mg/dL — AB (ref 65–99)
GLUCOSE-CAPILLARY: 213 mg/dL — AB (ref 65–99)
GLUCOSE-CAPILLARY: 215 mg/dL — AB (ref 65–99)
Glucose-Capillary: 137 mg/dL — ABNORMAL HIGH (ref 65–99)

## 2016-10-08 LAB — AMMONIA: Ammonia: 47 umol/L — ABNORMAL HIGH (ref 9–35)

## 2016-10-08 LAB — CBC
HEMATOCRIT: 35.9 % — AB (ref 36.0–46.0)
HEMOGLOBIN: 11.9 g/dL — AB (ref 12.0–15.0)
MCH: 27.8 pg (ref 26.0–34.0)
MCHC: 33.1 g/dL (ref 30.0–36.0)
MCV: 83.9 fL (ref 78.0–100.0)
Platelets: 174 10*3/uL (ref 150–400)
RBC: 4.28 MIL/uL (ref 3.87–5.11)
RDW: 13.4 % (ref 11.5–15.5)
WBC: 14 10*3/uL — AB (ref 4.0–10.5)

## 2016-10-08 LAB — COMPREHENSIVE METABOLIC PANEL
ALBUMIN: 2.9 g/dL — AB (ref 3.5–5.0)
ALK PHOS: 84 U/L (ref 38–126)
ALT: 134 U/L — AB (ref 14–54)
AST: 157 U/L — ABNORMAL HIGH (ref 15–41)
Anion gap: 12 (ref 5–15)
BILIRUBIN TOTAL: 4.5 mg/dL — AB (ref 0.3–1.2)
BUN: 11 mg/dL (ref 6–20)
CALCIUM: 7.8 mg/dL — AB (ref 8.9–10.3)
CO2: 21 mmol/L — AB (ref 22–32)
Chloride: 102 mmol/L (ref 101–111)
Creatinine, Ser: 0.93 mg/dL (ref 0.44–1.00)
GFR calc Af Amer: >60 mL/min (ref >60)
GFR calc non Af Amer: 53 mL/min — ABNORMAL LOW (ref >60)
GLUCOSE: 216 mg/dL — AB (ref 65–99)
Potassium: 3.1 mmol/L — ABNORMAL LOW (ref 3.5–5.1)
SODIUM: 135 mmol/L (ref 135–145)
TOTAL PROTEIN: 6.3 g/dL — AB (ref 6.5–8.1)

## 2016-10-08 LAB — PROTIME-INR
INR: 1.52
Prothrombin Time: 18.5 seconds — ABNORMAL HIGH (ref 11.4–15.2)

## 2016-10-08 LAB — LIPASE, BLOOD: Lipase: 14 U/L (ref 11–51)

## 2016-10-08 MED ORDER — LIDOCAINE HCL (PF) 1 % IJ SOLN
INTRAMUSCULAR | Status: AC
  Filled 2016-10-08: qty 10

## 2016-10-08 MED ORDER — ORAL CARE MOUTH RINSE
15.0000 mL | Freq: Two times a day (BID) | OROMUCOSAL | Status: DC
  Administered 2016-10-09 – 2016-10-12 (×5): 15 mL via OROMUCOSAL

## 2016-10-08 MED ORDER — MIDAZOLAM HCL 2 MG/2ML IJ SOLN
INTRAMUSCULAR | Status: AC | PRN
  Administered 2016-10-08: 1 mg via INTRAVENOUS
  Administered 2016-10-08 (×2): 0.5 mg via INTRAVENOUS

## 2016-10-08 MED ORDER — LIDOCAINE HCL (PF) 1 % IJ SOLN
INTRAMUSCULAR | Status: AC | PRN
  Administered 2016-10-08: 5 mL

## 2016-10-08 MED ORDER — FENTANYL CITRATE (PF) 100 MCG/2ML IJ SOLN
INTRAMUSCULAR | Status: AC | PRN
  Administered 2016-10-08: 50 ug via INTRAVENOUS
  Administered 2016-10-08 (×2): 25 ug via INTRAVENOUS

## 2016-10-08 MED ORDER — SODIUM CHLORIDE 0.9 % IV SOLN
30.0000 meq | Freq: Once | INTRAVENOUS | Status: AC
  Administered 2016-10-08: 30 meq via INTRAVENOUS
  Filled 2016-10-08: qty 15

## 2016-10-08 MED ORDER — FENTANYL CITRATE (PF) 100 MCG/2ML IJ SOLN
INTRAMUSCULAR | Status: AC
  Filled 2016-10-08: qty 4

## 2016-10-08 MED ORDER — MIDAZOLAM HCL 2 MG/2ML IJ SOLN
INTRAMUSCULAR | Status: AC
  Filled 2016-10-08: qty 4

## 2016-10-08 MED ORDER — IOPAMIDOL (ISOVUE-300) INJECTION 61%
INTRAVENOUS | Status: AC
  Administered 2016-10-08: 10 mL
  Filled 2016-10-08: qty 50

## 2016-10-08 MED ORDER — LEVALBUTEROL HCL 1.25 MG/0.5ML IN NEBU
1.2500 mg | INHALATION_SOLUTION | Freq: Once | RESPIRATORY_TRACT | Status: AC
  Administered 2016-10-08: 1.25 mg via RESPIRATORY_TRACT
  Filled 2016-10-08: qty 0.5

## 2016-10-08 NOTE — Progress Notes (Addendum)
PROGRESS NOTE        PATIENT DETAILS Name: Maria [name] Age: 83 y.o. Sex: female Date of Birth: 1924-06-21 Admit Date: 10/06/2016 Admitting Physician Etta Quill, DO [id number] Alroy Dust, MD  Brief Narrative: Patient is a 83 y.o. female with history of complete heart block-status post permanent pacemaker implantation, hypertension, type 2 diabetes admitted with mostly right upper quadrant pain, vomiting-thought to have acute acalculous cholecystitis. Patient was subsequently admitted for further evaluation and treatment  Subjective: Continues to have right upper quadrant pain  Assessment/Plan: Acute acalculous cholecystitis: Remains essentially unchanged-continues to have RUQ pain and is febrile. Pre-op eval completed by cards-thought to be high risk candidate-Gen surgery recommends placement of percutaneous drain. LFT's are up slightly-plans are to follow and order MRCP if still persistently elevated. Continue empiric Zosyn. Continue to keep NPO for now.  Systemic inflammatory response syndrome: Likely secondary to above, hemodynamics remain stable-doubt sepsis syndrome. Continue antimicrobial therapy and IV fluids. Await blood cultures.  Newly elevated troponins: Trend is mostly flat-I suspect this is demand ischemia rather than ACS. She does not have chest pain-pain is mostly in the right upper quadrant abdominal area. EKG shows a paced rhythm. Echo shows preserved EF with severe hypokinesis of the apex and anetero-sepal myocardium. Suspect further work up can be pursued in the outpatient setting-but with advance age-best managed medically. Once diet stable-will start ASA/Statin/Beta Blocker.   Asymptomatic bacteriuria: UA consistent with UTI-however patient symptoms are mostly consistent with cholecystitis. On antimicrobial therapy in any event that should cover, follow urine cultures.   Type 2 diabetes: CBGs relatively stable with SSI-continue to hold oral  hypoglycemic agents  Hypertension: Blood pressure relatively stable-continue to hold oral antihypertensives-agree with using as needed IV Lopressor for elevated blood pressure readings.  History of complete heart block-has permanent pacemaker in place  Severe Pul HTN: seen on Echo-further work-in the outpatient setting-once recovered from acute illness.   DVT Prophylaxis: Prophylactic Lovenox  Code Status: Full code  Family Communication: None at bedside  Disposition Plan: Remain inpatient-may require on Home health vs SNF on discharge-will require several more day of hospitalization prior to d/c  Antimicrobial agents: Anti-infectives    Start     Dose/Rate Route Frequency Ordered Stop   10/07/16 1000  piperacillin-tazobactam (ZOSYN) IVPB 3.375 g     3.375 g 12.5 mL/hr over 240 Minutes Intravenous Every 8 hours 10/07/16 0143     10/06/16 2345  piperacillin-tazobactam (ZOSYN) IVPB 3.375 g     3.375 g 100 mL/hr over 30 Minutes Intravenous  Once 10/06/16 2341 10/07/16 0200   10/06/16 2345  vancomycin (VANCOCIN) IVPB 1000 mg/200 mL premix  Status:  Discontinued     1,000 mg 200 mL/hr over 60 Minutes Intravenous  Once 10/06/16 2341 10/07/16 0046      Procedures: Echo 1/25>>Study Conclusions - Left ventricle: The cavity size was normal. Wall thickness was   increased in a pattern of mild LVH. Systolic function was normal.   The estimated ejection fraction was in the range of 50% to 55%.   Severe hypokinesis of the apical myocardium. Hypokinesis of the   anteroseptal myocardium. Doppler parameters are consistent with   abnormal left ventricular relaxation (grade 1 diastolic   dysfunction). - Ventricular septum: The contour showed systolic flattening. These   changes are consistent with RV pressure overload. - Mitral valve: There was mild  regurgitation. - Right ventricle: The cavity size was mildly dilated. Wall   thickness was normal. - Right atrium: The atrium was mildly  dilated. - Tricuspid valve: There was moderate-severe regurgitation. - Pulmonary arteries: Systolic pressure was severely increased. PA   peak pressure: 81 mm Hg (S).  CONSULTS:  cardiology and general surgery  Time spent: 25 minutes-Greater than 50% of this time was spent in counseling, explanation of diagnosis, planning of further management, and coordination of care.  MEDICATIONS: Scheduled Meds: . enoxaparin (LOVENOX) injection  40 mg Subcutaneous Daily  . insulin aspart  0-9 Units Subcutaneous Q4H  . mouth rinse  15 mL Mouth Rinse BID  . piperacillin-tazobactam (ZOSYN)  IV  3.375 g Intravenous Q8H  . potassium chloride (KCL MULTIRUN) 30 mEq in 265 mL IVPB  30 mEq Intravenous Once  . sodium chloride flush  3 mL Intravenous Q12H   Continuous Infusions: . sodium chloride 75 mL/hr at 10/07/16 2214   PRN Meds:.acetaminophen, albuterol, metoprolol, morphine injection   PHYSICAL EXAM: Vital signs: Vitals:   10/08/16 0154 10/08/16 0405 10/08/16 0640 10/08/16 0744  BP:  138/73  (!) 113/56  Pulse:  (!) 101  96  Resp:  (!) 38  (!) 25  Temp:  (!) 100.4 F (38 C) (!) 101 F (38.3 C) (!) 101.4 F (38.6 C)  TempSrc:  Axillary  Axillary  SpO2: 99% 99%  98%  Weight:      Height:       Filed Weights   10/07/16 0606  Weight: 72.3 kg (159 lb 6.3 oz)   Body mass index is 29.15 kg/m.   General appearance :Awake, alert-slightly lethargic. Speech Clear. Not toxic Looking Eyes:, pupils equally reactive to light and accomodation,no scleral icterus. HEENT: Atraumatic and Normocephalic Neck: supple, no JVD. No cervical lymphadenopathy. No thyromegaly Resp:Good air entry bilaterally, no added sounds  CVS: S1 S2 regular, no murmurs.  GI: Bowel sounds present,+ Tender RUQ area, with mild gaurding-but abd is soft.  Extremities: B/L Lower Ext shows no edema, both legs are warm to touch Neurology:  speech clear,Non focal, sensation is grossly intact. Psychiatric: Normal judgment and  insight. Alert and oriented x 3. Normal mood. Musculoskeletal:No digital cyanosis Skin:No Rash, warm and dry Wounds:N/A  I have personally reviewed following labs and imaging studies  LABORATORY DATA: CBC:  Recent Labs Lab 10/06/16 1828 10/06/16 2241 10/07/16 0130 10/08/16 0646  WBC 10.4  --  14.4* 14.0*  HGB 15.1* 16.3* 13.9 11.9*  HCT 43.5 48.0* 40.9 35.9*  MCV 83.5  --  84.0 83.9  PLT 244  --  252 [id number]    Basic Metabolic Panel:  Recent Labs Lab 10/06/16 2355 10/07/16 0130 10/07/16 0405 10/07/16 0604 10/08/16 0646  NA 132* 131* 134* 135 135  K 3.7 3.7 3.5 3.6 3.1*  CL 93* 92* 101 101 102  CO2 21* 20* 20* 20* 21*  GLUCOSE 271* 245* 226* 200* 216*  BUN 14 12 9 10 11   CREATININE 0.76 0.80 0.82 0.70 0.93  CALCIUM 9.7 9.2 8.2* 8.4* 7.8*    GFR: Estimated Creatinine Clearance: 37.4 mL/min (by C-G formula based on SCr of 0.93 mg/dL).  Liver Function Tests:  Recent Labs Lab 10/07/16 0130 10/08/16 0646  AST 27 157*  ALT 18 134*  ALKPHOS 35* 84  BILITOT 1.2 4.5*  PROT 7.6 6.3*  ALBUMIN 3.9 2.9*    Recent Labs Lab 10/07/16 0130  LIPASE <10*   No results for input(s): AMMONIA in the last 168 hours.  Coagulation Profile:  Recent Labs Lab 10/08/16 0859  INR 1.52    Cardiac Enzymes:  Recent Labs Lab 10/07/16 0405 10/07/16 0604 10/07/16 1258  TROPONINI 0.27* 0.29* 0.34*    BNP (last 3 results) No results for input(s): PROBNP in the last 8760 hours.  HbA1C: No results for input(s): HGBA1C in the last 72 hours.  CBG:  Recent Labs Lab 10/07/16 1610 10/07/16 2037 10/08/16 0020 10/08/16 0409 10/08/16 0742  GLUCAP 203* 204* 213* 215* 207*    Lipid Profile: No results for input(s): CHOL, HDL, LDLCALC, TRIG, CHOLHDL, LDLDIRECT in the last 72 hours.  Thyroid Function Tests: No results for input(s): TSH, T4TOTAL, FREET4, T3FREE, THYROIDAB in the last 72 hours.  Anemia Panel: No results for input(s): VITAMINB12, FOLATE, FERRITIN,  TIBC, IRON, RETICCTPCT in the last 72 hours.  Urine analysis:    Component Value Date/Time   COLORURINE YELLOW 10/07/2016 0034   APPEARANCEUR HAZY (A) 10/07/2016 0034   LABSPEC 1.032 (H) 10/07/2016 0034   PHURINE 6.0 10/07/2016 0034   GLUCOSEU >=500 (A) 10/07/2016 0034   HGBUR SMALL (A) 10/07/2016 0034   BILIRUBINUR NEGATIVE 10/07/2016 0034   KETONESUR 20 (A) 10/07/2016 0034   PROTEINUR 100 (A) 10/07/2016 0034   NITRITE NEGATIVE 10/07/2016 0034   LEUKOCYTESUR TRACE (A) 10/07/2016 0034    Sepsis Labs: Lactic Acid, Venous    Component Value Date/Time   LATICACIDVEN 3.85 ([place]) 10/06/2016 2335    MICROBIOLOGY: Recent Results (from the past 240 hour(s))  Urine culture     Status: Abnormal (Preliminary result)   Collection Time: 10/07/16 12:34 AM  Result Value Ref Range Status   Specimen Description URINE, RANDOM  Final   Special Requests NONE  Final   Culture >=100,000 COLONIES/mL GRAM NEGATIVE RODS (A)  Final   Report Status PENDING  Incomplete  MRSA PCR Screening     Status: None   Collection Time: 10/07/16  6:03 AM  Result Value Ref Range Status   MRSA by PCR NEGATIVE NEGATIVE Final    Comment:        The GeneXpert MRSA Assay (FDA approved for NASAL specimens only), is one component of a comprehensive MRSA colonization surveillance program. It is not intended to diagnose MRSA infection nor to guide or monitor treatment for MRSA infections.     RADIOLOGY STUDIES/RESULTS: Dg Chest 2 View  Result Date: 10/06/2016 CLINICAL DATA:  Mid chest pain for 2 days with shortness of breath EXAM: CHEST  2 VIEW COMPARISON:  04/04/2014 FINDINGS: Low volume film with stable asymmetric elevation right hemidiaphragm. The lungs are clear wiithout focal pneumonia, edema, pneumothorax or pleural effusion. Cardiopericardial silhouette is at upper limits of normal for size. Left-sided dual lead permanent pacemaker again noted. Degenerative changes are evident in the shoulders bilaterally.  IMPRESSION: Stable.  No acute findings. Electronically Signed   By: Misty Stanley M.D.   On: 10/06/2016 18:53   Nm Hepatobiliary Including Gb  Result Date: 10/07/2016 CLINICAL DATA:  Acute acalculous cholecystitis EXAM: NUCLEAR MEDICINE HEPATOBILIARY IMAGING TECHNIQUE: Sequential images of the abdomen were obtained out to 60 minutes following intravenous administration of radiopharmaceutical. RADIOPHARMACEUTICALS:  5.2 mCi Tc-15m  Choletec IV COMPARISON:  CT abdomen pelvis 10/06/2016 FINDINGS: There is good uptake by the liver. Small bowel activity is noted beginning at 10 minutes. No gallbladder activity was seen at 1 hour The patient received 2.0 mg of IV morphine in attempt to visualize the gallbladder. Imaging was obtained for another 33 minutes at which time the patient refused further imaging.  No gallbladder activity was demonstrated post morphine administration. IMPRESSION: Negative for gallbladder activity including after morphine administration. Findings consistent with cystic duct obstruction. Electronically Signed   By: Franchot Gallo M.D.   On: 10/07/2016 11:25   Ct Abdomen Pelvis W Contrast  Result Date: 10/07/2016 CLINICAL DATA:  Chest pain with nausea and vomiting. EXAM: CT ABDOMEN AND PELVIS WITH CONTRAST TECHNIQUE: Multidetector CT imaging of the abdomen and pelvis was performed using the standard protocol following bolus administration of intravenous contrast. CONTRAST:  14mL ISOVUE-300 IOPAMIDOL (ISOVUE-300) INJECTION 61% COMPARISON:  None. FINDINGS: Lower chest: Borderline cardiomegaly with right atrial and right ventricular pacing leads noted. No pericardial effusion. Bibasilar atelectasis. No pneumonic consolidation, effusion or pneumothorax. Hepatobiliary: Mild fatty infiltration of the liver without space-occupying mass. No biliary dilatation. Distended gallbladder with mild wall thickening up to 4 mm and pericholecystic fluid concerning for acalculus cholecystitis. No gallstones or  choledocholithiasis. Pancreas: Unremarkable Spleen: No splenomegaly.  Small splenule at the hilum. Adrenals/Urinary Tract: Normal bilateral adrenal glands. Tiny too small to further characterize 5 mm left upper abdominal and interpolar and lower pole hypodensity statistically consistent with cysts. No hydroureteronephrosis. Urinary bladder is physiologically distended. Stomach/Bowel: Nondistended stomach. Normal small bowel rotation. No acute bowel obstruction or inflammation. Vascular/Lymphatic: Moderate aortoiliac atherosclerosis. No aneurysm. No lymphadenopathy. Reproductive: Fibroid uterus with a noncalcified intramural 12 mm in diameter fibroid with partially calcified 16 mm left-sided subserosal fibroid. A few calcifications are noted intramural in etiology on the left which may also reflect tiny calcified fibroids. Other: No abdominal wall hernia or abnormality. No abdominopelvic ascites. Musculoskeletal: Lumbar degenerative disc disease L2 through L5 with small posterior marginal osteophytes. Degenerative joint space narrowing of both hips with spurring off the femoral head- neck junctures. No acute osseous abnormality. IMPRESSION: 1. Distended slightly thick-walled gallbladder with pericholecystic fluid concerning for acalculus cholecystitis. 2. Colonic diverticulosis without acute diverticulitis. 3. Fibroid uterus. 4. Subcentimeter left renal cysts too small for further characterize. Electronically Signed   By: Ashley Royalty M.D.   On: 10/07/2016 00:23   Dg Chest Port 1 View  Result Date: 10/08/2016 CLINICAL DATA:  83 year old female with increasing shortness of breath. EXAM: PORTABLE CHEST 1 VIEW COMPARISON:  Chest radiograph dated 10/06/2016 FINDINGS: There is low lung volumes with stable eventration of the right hemidiaphragm. Left lung base density appears similar to prior radiograph and likely represents atelectatic changes. Infiltrate is not excluded. Clinical correlation is recommended. There is  no pleural effusion or pneumothorax. Stable cardiac silhouette. Left pectoral dual lead pacemaker wires. There is degenerative changes of the spine and shoulders. No acute fracture. IMPRESSION: No significant interval change. Left lung base density likely atelectatic changes. Developing infiltrate is not excluded. Clinical correlation is recommended. Electronically Signed   By: Anner Crete M.D.   On: 10/08/2016 01:11     LOS: 1 day   Oren Binet, MD  [facility] Pager:[phone]  If 7PM-7AM, please contact night-coverage [email or web address] Password [email or web address] 10/08/2016, 9:51 AM

## 2016-10-08 NOTE — Progress Notes (Signed)
[facility] Progress Note     Subjective: Pt very lethargic this morning. Will open eyes when asked to. Febrile overnight.   Objective: Vital signs in last 24 hours: Temp:  [98.3 F (36.8 C)-101.4 F (38.6 C)] 101.4 F (38.6 C) (01/26 0744) Pulse Rate:  [93-105] 96 (01/26 0744) Resp:  [25-38] 25 (01/26 0744) BP: (113-139)/(56-75) 113/56 (01/26 0744) SpO2:  [98 %-99 %] 98 % (01/26 0744) Last BM Date:  (unk)  Intake/Output from previous day: 01/25 0701 - 01/26 0700 In: 1950 [I.V.:1800; IV Piggyback:150] Out: 200 [Urine:200] Intake/Output this shift: No intake/output data recorded.  PE: Gen:  lethargic, NAD, cooperative, lying in bed Pulm:  Rate and effort normal, no respiratory distress Abd: Soft, nondistended, +BS, generalized abdominal tenderness Skin: not diaphoretic, warm and dry  Lab Results:   Recent Labs  10/07/16 0130 10/08/16 0646  WBC 14.4* 14.0*  HGB 13.9 11.9*  HCT 40.9 35.9*  PLT 252 174   BMET  Recent Labs  10/07/16 0604 10/08/16 0646  NA 135 135  K 3.6 3.1*  CL 101 102  CO2 20* 21*  GLUCOSE 200* 216*  BUN 10 11  CREATININE 0.70 0.93  CALCIUM 8.4* 7.8*   PT/INR No results for input(s): LABPROT, INR in the last 72 hours. CMP     Component Value Date/Time   NA 135 10/08/2016 0646   K 3.1 (L) 10/08/2016 0646   CL 102 10/08/2016 0646   CO2 21 (L) 10/08/2016 0646   GLUCOSE 216 (H) 10/08/2016 0646   BUN 11 10/08/2016 0646   CREATININE 0.93 10/08/2016 0646   CALCIUM 7.8 (L) 10/08/2016 0646   PROT 6.3 (L) 10/08/2016 0646   ALBUMIN 2.9 (L) 10/08/2016 0646   AST 157 (H) 10/08/2016 0646   ALT 134 (H) 10/08/2016 0646   ALKPHOS 84 10/08/2016 0646   BILITOT 4.5 (H) 10/08/2016 0646   GFRNONAA 53 (L) 10/08/2016 0646   GFRAA >60 10/08/2016 0646   Lipase     Component Value Date/Time   LIPASE <10 (L) 10/07/2016 0130       Studies/Results: Dg Chest 2 View  Result Date: 10/06/2016 CLINICAL DATA:  Mid chest pain for 2  days with shortness of breath EXAM: CHEST  2 VIEW COMPARISON:  04/04/2014 FINDINGS: Low volume film with stable asymmetric elevation right hemidiaphragm. The lungs are clear wiithout focal pneumonia, edema, pneumothorax or pleural effusion. Cardiopericardial silhouette is at upper limits of normal for size. Left-sided dual lead permanent pacemaker again noted. Degenerative changes are evident in the shoulders bilaterally. IMPRESSION: Stable.  No acute findings. Electronically Signed   By: Misty Stanley M.D.   On: 10/06/2016 18:53   Nm Hepatobiliary Including Gb  Result Date: 10/07/2016 CLINICAL DATA:  Acute acalculous cholecystitis EXAM: NUCLEAR MEDICINE HEPATOBILIARY IMAGING TECHNIQUE: Sequential images of the abdomen were obtained out to 60 minutes following intravenous administration of radiopharmaceutical. RADIOPHARMACEUTICALS:  5.2 mCi Tc-84m  Choletec IV COMPARISON:  CT abdomen pelvis 10/06/2016 FINDINGS: There is good uptake by the liver. Small bowel activity is noted beginning at 10 minutes. No gallbladder activity was seen at 1 hour The patient received 2.0 mg of IV morphine in attempt to visualize the gallbladder. Imaging was obtained for another 33 minutes at which time the patient refused further imaging. No gallbladder activity was demonstrated post morphine administration. IMPRESSION: Negative for gallbladder activity including after morphine administration. Findings consistent with cystic duct obstruction. Electronically Signed   By: Franchot Gallo M.D.   On: 10/07/2016 11:25  Ct Abdomen Pelvis W Contrast  Result Date: 10/07/2016 CLINICAL DATA:  Chest pain with nausea and vomiting. EXAM: CT ABDOMEN AND PELVIS WITH CONTRAST TECHNIQUE: Multidetector CT imaging of the abdomen and pelvis was performed using the standard protocol following bolus administration of intravenous contrast. CONTRAST:  128mL ISOVUE-300 IOPAMIDOL (ISOVUE-300) INJECTION 61% COMPARISON:  None. FINDINGS: Lower chest:  Borderline cardiomegaly with right atrial and right ventricular pacing leads noted. No pericardial effusion. Bibasilar atelectasis. No pneumonic consolidation, effusion or pneumothorax. Hepatobiliary: Mild fatty infiltration of the liver without space-occupying mass. No biliary dilatation. Distended gallbladder with mild wall thickening up to 4 mm and pericholecystic fluid concerning for acalculus cholecystitis. No gallstones or choledocholithiasis. Pancreas: Unremarkable Spleen: No splenomegaly.  Small splenule at the hilum. Adrenals/Urinary Tract: Normal bilateral adrenal glands. Tiny too small to further characterize 5 mm left upper abdominal and interpolar and lower pole hypodensity statistically consistent with cysts. No hydroureteronephrosis. Urinary bladder is physiologically distended. Stomach/Bowel: Nondistended stomach. Normal small bowel rotation. No acute bowel obstruction or inflammation. Vascular/Lymphatic: Moderate aortoiliac atherosclerosis. No aneurysm. No lymphadenopathy. Reproductive: Fibroid uterus with a noncalcified intramural 12 mm in diameter fibroid with partially calcified 16 mm left-sided subserosal fibroid. A few calcifications are noted intramural in etiology on the left which may also reflect tiny calcified fibroids. Other: No abdominal wall hernia or abnormality. No abdominopelvic ascites. Musculoskeletal: Lumbar degenerative disc disease L2 through L5 with small posterior marginal osteophytes. Degenerative joint space narrowing of both hips with spurring off the femoral head- neck junctures. No acute osseous abnormality. IMPRESSION: 1. Distended slightly thick-walled gallbladder with pericholecystic fluid concerning for acalculus cholecystitis. 2. Colonic diverticulosis without acute diverticulitis. 3. Fibroid uterus. 4. Subcentimeter left renal cysts too small for further characterize. Electronically Signed   By: Ashley Royalty M.D.   On: 10/07/2016 00:23   Dg Chest Port 1  View  Result Date: 10/08/2016 CLINICAL DATA:  83 year old female with increasing shortness of breath. EXAM: PORTABLE CHEST 1 VIEW COMPARISON:  Chest radiograph dated 10/06/2016 FINDINGS: There is low lung volumes with stable eventration of the right hemidiaphragm. Left lung base density appears similar to prior radiograph and likely represents atelectatic changes. Infiltrate is not excluded. Clinical correlation is recommended. There is no pleural effusion or pneumothorax. Stable cardiac silhouette. Left pectoral dual lead pacemaker wires. There is degenerative changes of the spine and shoulders. No acute fracture. IMPRESSION: No significant interval change. Left lung base density likely atelectatic changes. Developing infiltrate is not excluded. Clinical correlation is recommended. Electronically Signed   By: Anner Crete M.D.   On: 10/08/2016 01:11    Anti-infectives: Anti-infectives    Start     Dose/Rate Route Frequency Ordered Stop   10/07/16 1000  piperacillin-tazobactam (ZOSYN) IVPB 3.375 g     3.375 g 12.5 mL/hr over 240 Minutes Intravenous Every 8 hours 10/07/16 0143     10/06/16 2345  piperacillin-tazobactam (ZOSYN) IVPB 3.375 g     3.375 g 100 mL/hr over 30 Minutes Intravenous  Once 10/06/16 2341 10/07/16 0200   10/06/16 2345  vancomycin (VANCOCIN) IVPB 1000 mg/200 mL premix  Status:  Discontinued     1,000 mg 200 mL/hr over 60 Minutes Intravenous  Once 10/06/16 2341 10/07/16 0046       Assessment/Plan  Systemic inflammatory response syndrome: pending blood cultures Newly elevated troponins:  Asymptomatic bacteriuria:  Type 2 diabetes:  Hypertension:  History of complete heart block-has permanent pacemaker in place   Right upper quadrant pain: cholecystitis with possible choledocholithiasis  - HIDA showed:  Negative for gallbladder activity including after morphine administration. Findings consistent with cystic duct obstruction - pt has marked elevated LFT's and  bilirubin today suggesting possible choledocholithiasis, no stones seen on CT although CT is not the best modality to see gall stones, [place] not preformed - IR will place perc chole drain - lipase pending - ordered ammonia with increased letheragy  Spoke with Dr. Sloan Leiter to discuss possible need for GI consult/MRCP. IR still planning on perc drain. IR may be able to do contrast study during drain placement to evaluate CBD. Lipase and ammonia pending. We will continue to follow   LOS: 1 day    Kalman Drape , [facility] 10/08/2016, 9:10 AM Pager: [phone] Consults: [phone] Mon-Fri 7:00 am-4:30 pm Sat-Sun 7:00 am-11:30 am

## 2016-10-08 NOTE — Procedures (Signed)
S/p PERC CHOLECYSTOSTOMY  No comp Stable purlulent bile aspirated cx sent Full report in PACS

## 2016-10-08 NOTE — Progress Notes (Signed)
Inpatient Diabetes Program Recommendations  AACE/ADA: New Consensus Statement on Inpatient Glycemic Control (2015)  Target Ranges:  Prepandial:   less than 140 mg/dL      Peak postprandial:   less than 180 mg/dL (1-2 hours)      Critically ill patients:  140 - 180 mg/dL   Results for Maria [name], Maria [name] (MRN [id number]) as of 10/08/2016 12:02  Ref. Range 10/07/2016 12:04 10/07/2016 16:10 10/07/2016 20:37 10/08/2016 00:20 10/08/2016 04:09 10/08/2016 07:42 10/08/2016 11:55  Glucose-Capillary Latest Ref Range: 65 - 99 mg/dL 232 (H) 203 (H) 204 (H) 213 (H) 215 (H) 207 (H) 189 (H)   Review of Glycemic Control  Diabetes history: DM 2 Outpatient Diabetes medications: Metformin 500 mg BID Current orders for Inpatient glycemic control: Novolog Sensitive Q4 hours  Inpatient Diabetes Program Recommendations:   Glucose consistently in the 200's. Please consider increasing correction to Novolog Moderate Q 4 hours  Thanks,  Tama Headings RN, MSN, [facility] Inpatient Diabetes Coordinator Team Pager [phone] (8a-5p)

## 2016-10-08 NOTE — Care Management Note (Signed)
Case Management Note  Patient Details  Name: Maria [name] MRN: [id number] Date of Birth: 1924/07/27  Subjective/Objective:  Pt lives alone, daughter lives nearby and visits every afternoon, reports pt has cane and walker.  Per dtr's request, provided information on home health services vs private duty aide services.  Pt has [employer] and dtr plans to call to determine whether any aide services would be covered under that benefit.  Pt has previously gone for rehab @ SNF and dtr anticipates she may need the same after this hospitalization.                      Expected Discharge Plan:  Skilled Nursing Facility  In-House Referral:  Clinical Social Work  Discharge planning Services  CM Consult  Status of Service:  In process, will continue to follow   Girard Cooter, RN 10/08/2016, 2:51 PM

## 2016-10-08 NOTE — Sedation Documentation (Signed)
Patient is resting comfortably. 

## 2016-10-08 NOTE — Progress Notes (Signed)
Chief Complaint: Patient was seen in consultation today for acute cholecystitis at the request of Dr. Oren Binet  Referring Physician(s): Dr. Oren Binet  Supervising Physician: Daryll Brod  Patient Status: [facility] - In-pt  History of Present Illness: Maria [name] is a 83 y.o. female admitted with abdominal and chest pain. Her workup finds evidence of acute cholecystitis. Surgery has seen pt and initially recommended cholecystectomy, but after Cardiology eval, she is deemed high risk for OR/Gen anesthesia. IR is now requested to place perc chole drain. Chart, imaging, meds, labs, allergies reviewed. No family at bedside. Pt has been NPO  Past Medical History:  Diagnosis Date  . Anxiety   . Arthritis   . Cancer ([place])    breast 2015  . Cardiac pacemaker in situ   . Chest pain   . Diabetes mellitus without complication ([place])   . DM type 2 (diabetes mellitus, type 2) ([place])   . Dyslipidemia   . Hypertension   . Type II or unspecified type diabetes mellitus without mention of complication, not stated as uncontrolled     Past Surgical History:  Procedure Laterality Date  . CATARACT EXTRACTION    . KNEE SURGERY Bilateral   . PACEMAKER INSERTION     [employer], [facility] 2003  . PACEMAKER INSERTION    . TOTAL KNEE ARTHROPLASTY Bilateral     Allergies: Patient has no known allergies.  Medications:  Current Facility-Administered Medications:  .  0.9 %  sodium chloride infusion, , Intravenous, Continuous, Etta Quill, DO, Last Rate: 75 mL/hr at 10/07/16 2214 .  acetaminophen (TYLENOL) tablet 500 mg, 500 mg, Oral, Q6H PRN, Etta Quill, DO, 500 mg at 10/08/16 [id number] .  albuterol (PROVENTIL) (2.5 MG/3ML) 0.083% nebulizer solution 2.5 mg, 2.5 mg, Nebulization, Q2H PRN, Jonetta Osgood, MD .  enoxaparin (LOVENOX) injection 40 mg, 40 mg, Subcutaneous, Daily, Jared M Gardner, DO, 40 mg at 10/07/16 1500 .  insulin aspart (novoLOG) injection 0-9 Units, 0-9  Units, Subcutaneous, Q4H, Etta Quill, DO, 3 Units at 10/08/16 [phone] .  MEDLINE mouth rinse, 15 mL, Mouth Rinse, BID, Shanker Kristeen Mans, MD .  metoprolol (LOPRESSOR) injection 5 mg, 5 mg, Intravenous, Q5 min PRN, Etta Quill, DO .  morphine 4 MG/ML injection 2-4 mg, 2-4 mg, Intravenous, Q4H PRN, Etta Quill, DO, 2 mg at 10/08/16 0422 .  piperacillin-tazobactam (ZOSYN) IVPB 3.375 g, 3.375 g, Intravenous, Q8H, Franky Macho, RPH, 3.375 g at 10/08/16 0604 .  potassium chloride 30 mEq in sodium chloride 0.9 % 265 mL (KCL MULTIRUN) IVPB, 30 mEq, Intravenous, Once, Shanker Kristeen Mans, MD .  sodium chloride flush (NS) 0.9 % injection 3 mL, 3 mL, Intravenous, Q12H, Jared M Gardner, DO, 3 mL at 10/07/16 2214    Family History  Problem Relation Age of Onset  . Colon cancer Neg Hx     Social History   Social History  . Marital status: Widowed    Spouse name: N/A  . Number of children: 2  . Years of education: N/A   Social History Main Topics  . Smoking status: Never Smoker  . Smokeless tobacco: Never Used  . Alcohol use No  . Drug use: No  . Sexual activity: Not Asked   Other Topics Concern  . None   Social History Narrative   ** Merged History Encounter **        Review of Systems: A 12 point ROS discussed and pertinent positives are indicated  in the HPI above.  All other systems are negative.  Review of Systems  Vital Signs: BP (!) 113/56 (BP Location: Right Arm)   Pulse 96   Temp (!) 101.4 F (38.6 C) (Axillary)   Resp (!) 25   Ht 5\' 2"  (1.575 m)   Wt 159 lb 6.3 oz (72.3 kg)   SpO2 98%   BMI 29.15 kg/m   Physical Exam  Constitutional: She appears well-developed and well-nourished. No distress.  HENT:  Head: Normocephalic.  Mouth/Throat: Oropharynx is clear and moist.  Neck: Normal range of motion. No tracheal deviation present.  Cardiovascular: Normal rate, regular rhythm and normal heart sounds.   Pulmonary/Chest: Effort normal and breath sounds normal.  No respiratory distress.  Abdominal: Soft. She exhibits no distension. There is tenderness. There is no rebound and no guarding.  Neurological:  Pt awake and oriented to self and place, but doesn't understand what she is here for or treatment plan.     Mallampati Score:  MD Evaluation Airway: WNL Heart: WNL Abdomen: WNL Chest/ Lungs: WNL ASA  Classification: 3 Mallampati/Airway Score: Two  Imaging: Dg Chest 2 View  Result Date: 10/06/2016 CLINICAL DATA:  Mid chest pain for 2 days with shortness of breath EXAM: CHEST  2 VIEW COMPARISON:  04/04/2014 FINDINGS: Low volume film with stable asymmetric elevation right hemidiaphragm. The lungs are clear wiithout focal pneumonia, edema, pneumothorax or pleural effusion. Cardiopericardial silhouette is at upper limits of normal for size. Left-sided dual lead permanent pacemaker again noted. Degenerative changes are evident in the shoulders bilaterally. IMPRESSION: Stable.  No acute findings. Electronically Signed   By: Misty Stanley M.D.   On: 10/06/2016 18:53   Nm Hepatobiliary Including Gb  Result Date: 10/07/2016 CLINICAL DATA:  Acute acalculous cholecystitis EXAM: NUCLEAR MEDICINE HEPATOBILIARY IMAGING TECHNIQUE: Sequential images of the abdomen were obtained out to 60 minutes following intravenous administration of radiopharmaceutical. RADIOPHARMACEUTICALS:  5.2 mCi Tc-32m  Choletec IV COMPARISON:  CT abdomen pelvis 10/06/2016 FINDINGS: There is good uptake by the liver. Small bowel activity is noted beginning at 10 minutes. No gallbladder activity was seen at 1 hour The patient received 2.0 mg of IV morphine in attempt to visualize the gallbladder. Imaging was obtained for another 33 minutes at which time the patient refused further imaging. No gallbladder activity was demonstrated post morphine administration. IMPRESSION: Negative for gallbladder activity including after morphine administration. Findings consistent with cystic duct obstruction.  Electronically Signed   By: Franchot Gallo M.D.   On: 10/07/2016 11:25   Ct Abdomen Pelvis W Contrast  Result Date: 10/07/2016 CLINICAL DATA:  Chest pain with nausea and vomiting. EXAM: CT ABDOMEN AND PELVIS WITH CONTRAST TECHNIQUE: Multidetector CT imaging of the abdomen and pelvis was performed using the standard protocol following bolus administration of intravenous contrast. CONTRAST:  157mL ISOVUE-300 IOPAMIDOL (ISOVUE-300) INJECTION 61% COMPARISON:  None. FINDINGS: Lower chest: Borderline cardiomegaly with right atrial and right ventricular pacing leads noted. No pericardial effusion. Bibasilar atelectasis. No pneumonic consolidation, effusion or pneumothorax. Hepatobiliary: Mild fatty infiltration of the liver without space-occupying mass. No biliary dilatation. Distended gallbladder with mild wall thickening up to 4 mm and pericholecystic fluid concerning for acalculus cholecystitis. No gallstones or choledocholithiasis. Pancreas: Unremarkable Spleen: No splenomegaly.  Small splenule at the hilum. Adrenals/Urinary Tract: Normal bilateral adrenal glands. Tiny too small to further characterize 5 mm left upper abdominal and interpolar and lower pole hypodensity statistically consistent with cysts. No hydroureteronephrosis. Urinary bladder is physiologically distended. Stomach/Bowel: Nondistended stomach. Normal small bowel  rotation. No acute bowel obstruction or inflammation. Vascular/Lymphatic: Moderate aortoiliac atherosclerosis. No aneurysm. No lymphadenopathy. Reproductive: Fibroid uterus with a noncalcified intramural 12 mm in diameter fibroid with partially calcified 16 mm left-sided subserosal fibroid. A few calcifications are noted intramural in etiology on the left which may also reflect tiny calcified fibroids. Other: No abdominal wall hernia or abnormality. No abdominopelvic ascites. Musculoskeletal: Lumbar degenerative disc disease L2 through L5 with small posterior marginal osteophytes.  Degenerative joint space narrowing of both hips with spurring off the femoral head- neck junctures. No acute osseous abnormality. IMPRESSION: 1. Distended slightly thick-walled gallbladder with pericholecystic fluid concerning for acalculus cholecystitis. 2. Colonic diverticulosis without acute diverticulitis. 3. Fibroid uterus. 4. Subcentimeter left renal cysts too small for further characterize. Electronically Signed   By: Ashley Royalty M.D.   On: 10/07/2016 00:23   Dg Chest Port 1 View  Result Date: 10/08/2016 CLINICAL DATA:  83 year old female with increasing shortness of breath. EXAM: PORTABLE CHEST 1 VIEW COMPARISON:  Chest radiograph dated 10/06/2016 FINDINGS: There is low lung volumes with stable eventration of the right hemidiaphragm. Left lung base density appears similar to prior radiograph and likely represents atelectatic changes. Infiltrate is not excluded. Clinical correlation is recommended. There is no pleural effusion or pneumothorax. Stable cardiac silhouette. Left pectoral dual lead pacemaker wires. There is degenerative changes of the spine and shoulders. No acute fracture. IMPRESSION: No significant interval change. Left lung base density likely atelectatic changes. Developing infiltrate is not excluded. Clinical correlation is recommended. Electronically Signed   By: Anner Crete M.D.   On: 10/08/2016 01:11    Labs:  CBC:  Recent Labs  11/20/15 1156 10/06/16 1828 10/06/16 2241 10/07/16 0130 10/08/16 0646  WBC 5.5 10.4  --  14.4* 14.0*  HGB 13.6 15.1* 16.3* 13.9 11.9*  HCT 40.9 43.5 48.0* 40.9 35.9*  PLT 245 244  --  252 174    COAGS: No results for input(s): INR, APTT in the last 8760 hours.  BMP:  Recent Labs  10/07/16 0130 10/07/16 0405 10/07/16 0604 10/08/16 0646  NA 131* 134* 135 135  K 3.7 3.5 3.6 3.1*  CL 92* 101 101 102  CO2 20* 20* 20* 21*  GLUCOSE 245* 226* 200* 216*  BUN 12 9 10 11   CALCIUM 9.2 8.2* 8.4* 7.8*  CREATININE 0.80 0.82 0.70 0.93   GFRNONAA >60 >60 >60 53*  GFRAA >60 >60 >60 >60    LIVER FUNCTION TESTS:  Recent Labs  11/20/15 2007 10/07/16 0130 10/08/16 0646  BILITOT 0.9 1.2 4.5*  AST 26 27 157*  ALT 13* 18 134*  ALKPHOS 37* 35* 84  PROT 7.6 7.6 6.3*  ALBUMIN 4.1 3.9 2.9*    TUMOR MARKERS: No results for input(s): AFPTM, CEA, CA199, CHROMGRNA in the last 8760 hours.  Assessment and Plan: Acute cholecystitis Plan for perc chole drain today. Drain will need to stay minimum of 6-8 weeks.  Discussed with daughter over phone Risks and Benefits discussed with the patient's daughter including, but not limited to bleeding, infection, gallbladder perforation, bile leak, sepsis or even death. All questions were answered, patient's daughter is agreeable to proceed. Consent signed and in chart.    Thank you for this interesting consult.  I greatly enjoyed meeting Maria [name] and look forward to participating in their care.  A copy of this report was sent to the requesting provider on this date.  Electronically Signed: Ascencion Dike 10/08/2016, 8:37 AM   I spent a total of 20 minutes  in face to face in clinical consultation, greater than 50% of which was counseling/coordinating care for perc chole drain

## 2016-10-09 ENCOUNTER — Encounter (HOSPITAL_COMMUNITY): Payer: Self-pay

## 2016-10-09 LAB — COMPREHENSIVE METABOLIC PANEL
ALBUMIN: 2.6 g/dL — AB (ref 3.5–5.0)
ALT: 91 U/L — ABNORMAL HIGH (ref 14–54)
AST: 68 U/L — ABNORMAL HIGH (ref 15–41)
Alkaline Phosphatase: 83 U/L (ref 38–126)
Anion gap: 12 (ref 5–15)
BUN: 15 mg/dL (ref 6–20)
CHLORIDE: 104 mmol/L (ref 101–111)
CO2: 21 mmol/L — AB (ref 22–32)
CREATININE: 0.83 mg/dL (ref 0.44–1.00)
Calcium: 7.5 mg/dL — ABNORMAL LOW (ref 8.9–10.3)
GFR calc Af Amer: >60 mL/min (ref >60)
GFR calc non Af Amer: >60 mL/min (ref >60)
GLUCOSE: 121 mg/dL — AB (ref 65–99)
Potassium: 3.7 mmol/L (ref 3.5–5.1)
Sodium: 137 mmol/L (ref 135–145)
Total Bilirubin: 5.7 mg/dL — ABNORMAL HIGH (ref 0.3–1.2)
Total Protein: 6.5 g/dL (ref 6.5–8.1)

## 2016-10-09 LAB — GLUCOSE, CAPILLARY
GLUCOSE-CAPILLARY: 129 mg/dL — AB (ref 65–99)
GLUCOSE-CAPILLARY: 180 mg/dL — AB (ref 65–99)
GLUCOSE-CAPILLARY: 133 mg/dL — AB (ref 65–99)
Glucose-Capillary: 108 mg/dL — ABNORMAL HIGH (ref 65–99)
Glucose-Capillary: 120 mg/dL — ABNORMAL HIGH (ref 65–99)
Glucose-Capillary: 121 mg/dL — ABNORMAL HIGH (ref 65–99)
Glucose-Capillary: 195 mg/dL — ABNORMAL HIGH (ref 65–99)

## 2016-10-09 LAB — URINE CULTURE

## 2016-10-09 LAB — CBC
HEMATOCRIT: 34.7 % — AB (ref 36.0–46.0)
Hemoglobin: 11.6 g/dL — ABNORMAL LOW (ref 12.0–15.0)
MCH: 28.4 pg (ref 26.0–34.0)
MCHC: 33.4 g/dL (ref 30.0–36.0)
MCV: 84.8 fL (ref 78.0–100.0)
Platelets: 147 10*3/uL — ABNORMAL LOW (ref 150–400)
RBC: 4.09 MIL/uL (ref 3.87–5.11)
RDW: 13.4 % (ref 11.5–15.5)
WBC: 8.1 10*3/uL (ref 4.0–10.5)

## 2016-10-09 NOTE — Progress Notes (Signed)
PROGRESS NOTE        PATIENT DETAILS Name: Maria [name] Age: 83 y.o. Sex: female Date of Birth: 1924-05-04 Admit Date: 10/06/2016 Admitting Physician Etta Quill, DO [id number] Alroy Dust, MD  Brief Narrative: Patient is a 83 y.o. female with history of complete heart block-status post permanent pacemaker implantation, hypertension, type 2 diabetes admitted with mostly right upper quadrant pain, vomiting-thought to have acute acalculous cholecystitis. Patient was subsequently admitted for further evaluation and treatment  Subjective: Right upper quadrant pain has improved, lying comfortably in bed  Assessment/Plan: Acute cholecystitis:Improved, underwent percutaneous drain placement on 1/26. Seen by cards and thought to be a high risk operative candidate. Await cultures, continue Zosyn.Follow LFT's-AST/ALF have improved-but T. Bili continues to increase. No choledocholithiasis or biliary dilatation seen on CT scan abd. Unable to pursue MRI due to pacemaker. Will d/w GI over the phone.  Systemic inflammatory response syndrome: Likely secondary to above, hemodynamics remain stable-doubt sepsis syndrome. Continue Zosyn. Blood cultures negative so far, biliary cx pending  Newly elevated troponins: Trend is mostly flat-I suspect this is demand ischemia rather than ACS. She does not have chest pain-pain is mostly in the right upper quadrant abdominal area. EKG shows a paced rhythm. Echo shows preserved EF with severe hypokinesis of the apex and anetero-sepal myocardium. Suspect further work up can be pursued in the outpatient setting-but with advance age-best managed medically. Once diet stable-will start ASA/Statin/Beta Blocker.   Asymptomatic bacteriuria: UA consistent with UTI-however patient symptoms are mostly consistent with cholecystitis. On antimicrobial therapy in any event that should cover, follow urine cultures.   Type 2 diabetes: CBGs relatively stable with  SSI-continue to hold oral hypoglycemic agents  Hypertension: Blood pressure relatively stable-continue to hold oral antihypertensives-agree with using as needed IV Lopressor for elevated blood pressure readings.  History of complete heart block-has permanent pacemaker in place  Severe Pul HTN: seen on Echo-further work-in the outpatient setting-once recovered from acute illness.   Others: Mobilize with PT/RN staff Out of bed to chair  DVT Prophylaxis: Prophylactic Lovenox  Code Status: Full code  Family Communication: None at bedside  Disposition Plan: Remain inpatient-may require on Home health vs SNF on discharge-suspect d/c early next week  Antimicrobial agents: Anti-infectives    Start     Dose/Rate Route Frequency Ordered Stop   10/07/16 1000  piperacillin-tazobactam (ZOSYN) IVPB 3.375 g     3.375 g 12.5 mL/hr over 240 Minutes Intravenous Every 8 hours 10/07/16 0143     10/06/16 2345  piperacillin-tazobactam (ZOSYN) IVPB 3.375 g     3.375 g 100 mL/hr over 30 Minutes Intravenous  Once 10/06/16 2341 10/07/16 0200   10/06/16 2345  vancomycin (VANCOCIN) IVPB 1000 mg/200 mL premix  Status:  Discontinued     1,000 mg 200 mL/hr over 60 Minutes Intravenous  Once 10/06/16 2341 10/07/16 0046      Procedures: Echo 1/25>>Study Conclusions - Left ventricle: The cavity size was normal. Wall thickness was   increased in a pattern of mild LVH. Systolic function was normal.   The estimated ejection fraction was in the range of 50% to 55%.   Severe hypokinesis of the apical myocardium. Hypokinesis of the   anteroseptal myocardium. Doppler parameters are consistent with   abnormal left ventricular relaxation (grade 1 diastolic   dysfunction). - Ventricular septum: The contour showed systolic flattening. These  changes are consistent with RV pressure overload. - Mitral valve: There was mild regurgitation. - Right ventricle: The cavity size was mildly dilated. Wall   thickness  was normal. - Right atrium: The atrium was mildly dilated. - Tricuspid valve: There was moderate-severe regurgitation. - Pulmonary arteries: Systolic pressure was severely increased. PA   peak pressure: 81 mm Hg (S).  CONSULTS:  cardiology and general surgery  Time spent: 25 minutes-Greater than 50% of this time was spent in counseling, explanation of diagnosis, planning of further management, and coordination of care.  MEDICATIONS: Scheduled Meds: . enoxaparin (LOVENOX) injection  40 mg Subcutaneous Daily  . insulin aspart  0-9 Units Subcutaneous Q4H  . mouth rinse  15 mL Mouth Rinse BID  . piperacillin-tazobactam (ZOSYN)  IV  3.375 g Intravenous Q8H  . sodium chloride flush  3 mL Intravenous Q12H   Continuous Infusions: . sodium chloride 75 mL/hr at 10/09/16 0107   PRN Meds:.acetaminophen, albuterol, metoprolol, morphine injection   PHYSICAL EXAM: Vital signs: Vitals:   10/09/16 0400 10/09/16 0500 10/09/16 0600 10/09/16 0717  BP: 103/62 104/66 111/65 (!) 114/56  Pulse: 90 88 89 94  Resp: (!) 25 20 (!) 24 (!) 25  Temp: 97.3 F (36.3 C)   98 F (36.7 C)  TempSrc: Axillary   Oral  SpO2: 99%   100%  Weight:      Height:       Filed Weights   10/07/16 0606  Weight: 72.3 kg (159 lb 6.3 oz)   Body mass index is 29.15 kg/m.   General appearance :Awake, alert-slightly lethargic. Speech Clear. Not toxic Looking Eyes:, pupils equally reactive to light and accomodation,no scleral icterus. HEENT: Atraumatic and Normocephalic Neck: supple, no JVD. No cervical lymphadenopathy. No thyromegaly Resp:Good air entry bilaterally, no added sounds  CVS: S1 S2 regular, no murmurs.  GI: Bowel sounds present, minimally tender RUQ area Extremities: B/L Lower Ext shows no edema, both legs are warm to touch Neurology:  speech clear,Non focal, sensation is grossly intact. Psychiatric: Normal judgment and insight. Alert and oriented x 3. Normal mood. Musculoskeletal:No digital  cyanosis Skin:No Rash, warm and dry Wounds:N/A  I have personally reviewed following labs and imaging studies  LABORATORY DATA: CBC:  Recent Labs Lab 10/06/16 1828 10/06/16 2241 10/07/16 0130 10/08/16 0646 10/09/16 0257  WBC 10.4  --  14.4* 14.0* 8.1  HGB 15.1* 16.3* 13.9 11.9* 11.6*  HCT 43.5 48.0* 40.9 35.9* 34.7*  MCV 83.5  --  84.0 83.9 84.8  PLT 244  --  252 174 147*    Basic Metabolic Panel:  Recent Labs Lab 10/07/16 0130 10/07/16 0405 10/07/16 0604 10/08/16 0646 10/09/16 0257  NA 131* 134* 135 135 137  K 3.7 3.5 3.6 3.1* 3.7  CL 92* 101 101 102 104  CO2 20* 20* 20* 21* 21*  GLUCOSE 245* 226* 200* 216* 121*  BUN 12 9 10 11 15   CREATININE 0.80 0.82 0.70 0.93 0.83  CALCIUM 9.2 8.2* 8.4* 7.8* 7.5*    GFR: Estimated Creatinine Clearance: 42 mL/min (by C-G formula based on SCr of 0.83 mg/dL).  Liver Function Tests:  Recent Labs Lab 10/07/16 0130 10/08/16 0646 10/09/16 0257  AST 27 157* 68*  ALT 18 134* 91*  ALKPHOS 35* 84 83  BILITOT 1.2 4.5* 5.7*  PROT 7.6 6.3* 6.5  ALBUMIN 3.9 2.9* 2.6*    Recent Labs Lab 10/07/16 0130 10/08/16 1002  LIPASE <10* 14    Recent Labs Lab 10/08/16 1002  AMMONIA 47*  Coagulation Profile:  Recent Labs Lab 10/08/16 0859  INR 1.52    Cardiac Enzymes:  Recent Labs Lab 10/07/16 0405 10/07/16 0604 10/07/16 1258  TROPONINI 0.27* 0.29* 0.34*    BNP (last 3 results) No results for input(s): PROBNP in the last 8760 hours.  HbA1C: No results for input(s): HGBA1C in the last 72 hours.  CBG:  Recent Labs Lab 10/08/16 1155 10/08/16 1643 10/08/16 2014 10/09/16 0002 10/09/16 0423  GLUCAP 189* 147* 137* 121* 108*    Lipid Profile: No results for input(s): CHOL, HDL, LDLCALC, TRIG, CHOLHDL, LDLDIRECT in the last 72 hours.  Thyroid Function Tests: No results for input(s): TSH, T4TOTAL, FREET4, T3FREE, THYROIDAB in the last 72 hours.  Anemia Panel: No results for input(s): VITAMINB12,  FOLATE, FERRITIN, TIBC, IRON, RETICCTPCT in the last 72 hours.  Urine analysis:    Component Value Date/Time   COLORURINE YELLOW 10/07/2016 0034   APPEARANCEUR HAZY (A) 10/07/2016 0034   LABSPEC 1.032 (H) 10/07/2016 0034   PHURINE 6.0 10/07/2016 0034   GLUCOSEU >=500 (A) 10/07/2016 0034   HGBUR SMALL (A) 10/07/2016 0034   BILIRUBINUR NEGATIVE 10/07/2016 0034   KETONESUR 20 (A) 10/07/2016 0034   PROTEINUR 100 (A) 10/07/2016 0034   NITRITE NEGATIVE 10/07/2016 0034   LEUKOCYTESUR TRACE (A) 10/07/2016 0034    Sepsis Labs: Lactic Acid, Venous    Component Value Date/Time   LATICACIDVEN 3.85 (HH) 10/06/2016 2335    MICROBIOLOGY: Recent Results (from the past 240 hour(s))  Blood culture (routine x 2)     Status: None (Preliminary result)   Collection Time: 10/07/16 12:20 AM  Result Value Ref Range Status   Specimen Description BLOOD RIGHT ANTECUBITAL  Final   Special Requests IN PEDIATRIC BOTTLE 2CC  Final   Culture NO GROWTH 1 DAY  Final   Report Status PENDING  Incomplete  Urine culture     Status: Abnormal   Collection Time: 10/07/16 12:34 AM  Result Value Ref Range Status   Specimen Description URINE, RANDOM  Final   Special Requests NONE  Final   Culture >=100,000 COLONIES/mL ESCHERICHIA COLI (A)  Final   Report Status 10/09/2016 FINAL  Final   Organism ID, Bacteria ESCHERICHIA COLI (A)  Final      Susceptibility   Escherichia coli - MIC*    AMPICILLIN <=2 SENSITIVE Sensitive     CEFAZOLIN <=4 SENSITIVE Sensitive     CEFTRIAXONE <=1 SENSITIVE Sensitive     CIPROFLOXACIN <=0.25 SENSITIVE Sensitive     GENTAMICIN <=1 SENSITIVE Sensitive     IMIPENEM <=0.25 SENSITIVE Sensitive     NITROFURANTOIN <=16 SENSITIVE Sensitive     TRIMETH/SULFA <=20 SENSITIVE Sensitive     AMPICILLIN/SULBACTAM <=2 SENSITIVE Sensitive     PIP/TAZO <=4 SENSITIVE Sensitive     Extended ESBL NEGATIVE Sensitive     * >=100,000 COLONIES/mL ESCHERICHIA COLI  Blood culture (routine x 2)      Status: None (Preliminary result)   Collection Time: 10/07/16  1:30 AM  Result Value Ref Range Status   Specimen Description BLOOD LEFT ANTECUBITAL  Final   Special Requests BOTTLES DRAWN AEROBIC AND ANAEROBIC 10CC  Final   Culture NO GROWTH 1 DAY  Final   Report Status PENDING  Incomplete  MRSA PCR Screening     Status: None   Collection Time: 10/07/16  6:03 AM  Result Value Ref Range Status   MRSA by PCR NEGATIVE NEGATIVE Final    Comment:        The GeneXpert  MRSA Assay (FDA approved for NASAL specimens only), is one component of a comprehensive MRSA colonization surveillance program. It is not intended to diagnose MRSA infection nor to guide or monitor treatment for MRSA infections.   Aerobic/Anaerobic Culture (surgical/deep wound)     Status: None (Preliminary result)   Collection Time: 10/08/16 11:41 AM  Result Value Ref Range Status   Specimen Description GALL BLADDER  Final   Special Requests BILE  Final   Gram Stain   Final    ABUNDANT WBC PRESENT, PREDOMINANTLY PMN ABUNDANT GRAM NEGATIVE RODS    Culture PENDING  Incomplete   Report Status PENDING  Incomplete    RADIOLOGY STUDIES/RESULTS: Dg Chest 2 View  Result Date: 10/06/2016 CLINICAL DATA:  Mid chest pain for 2 days with shortness of breath EXAM: CHEST  2 VIEW COMPARISON:  04/04/2014 FINDINGS: Low volume film with stable asymmetric elevation right hemidiaphragm. The lungs are clear wiithout focal pneumonia, edema, pneumothorax or pleural effusion. Cardiopericardial silhouette is at upper limits of normal for size. Left-sided dual lead permanent pacemaker again noted. Degenerative changes are evident in the shoulders bilaterally. IMPRESSION: Stable.  No acute findings. Electronically Signed   By: Misty Stanley M.D.   On: 10/06/2016 18:53   Nm Hepatobiliary Including Gb  Result Date: 10/07/2016 CLINICAL DATA:  Acute acalculous cholecystitis EXAM: NUCLEAR MEDICINE HEPATOBILIARY IMAGING TECHNIQUE: Sequential images  of the abdomen were obtained out to 60 minutes following intravenous administration of radiopharmaceutical. RADIOPHARMACEUTICALS:  5.2 mCi Tc-26m  Choletec IV COMPARISON:  CT abdomen pelvis 10/06/2016 FINDINGS: There is good uptake by the liver. Small bowel activity is noted beginning at 10 minutes. No gallbladder activity was seen at 1 hour The patient received 2.0 mg of IV morphine in attempt to visualize the gallbladder. Imaging was obtained for another 33 minutes at which time the patient refused further imaging. No gallbladder activity was demonstrated post morphine administration. IMPRESSION: Negative for gallbladder activity including after morphine administration. Findings consistent with cystic duct obstruction. Electronically Signed   By: Franchot Gallo M.D.   On: 10/07/2016 11:25   Ct Abdomen Pelvis W Contrast  Result Date: 10/07/2016 CLINICAL DATA:  Chest pain with nausea and vomiting. EXAM: CT ABDOMEN AND PELVIS WITH CONTRAST TECHNIQUE: Multidetector CT imaging of the abdomen and pelvis was performed using the standard protocol following bolus administration of intravenous contrast. CONTRAST:  140mL ISOVUE-300 IOPAMIDOL (ISOVUE-300) INJECTION 61% COMPARISON:  None. FINDINGS: Lower chest: Borderline cardiomegaly with right atrial and right ventricular pacing leads noted. No pericardial effusion. Bibasilar atelectasis. No pneumonic consolidation, effusion or pneumothorax. Hepatobiliary: Mild fatty infiltration of the liver without space-occupying mass. No biliary dilatation. Distended gallbladder with mild wall thickening up to 4 mm and pericholecystic fluid concerning for acalculus cholecystitis. No gallstones or choledocholithiasis. Pancreas: Unremarkable Spleen: No splenomegaly.  Small splenule at the hilum. Adrenals/Urinary Tract: Normal bilateral adrenal glands. Tiny too small to further characterize 5 mm left upper abdominal and interpolar and lower pole hypodensity statistically consistent with  cysts. No hydroureteronephrosis. Urinary bladder is physiologically distended. Stomach/Bowel: Nondistended stomach. Normal small bowel rotation. No acute bowel obstruction or inflammation. Vascular/Lymphatic: Moderate aortoiliac atherosclerosis. No aneurysm. No lymphadenopathy. Reproductive: Fibroid uterus with a noncalcified intramural 12 mm in diameter fibroid with partially calcified 16 mm left-sided subserosal fibroid. A few calcifications are noted intramural in etiology on the left which may also reflect tiny calcified fibroids. Other: No abdominal wall hernia or abnormality. No abdominopelvic ascites. Musculoskeletal: Lumbar degenerative disc disease L2 through L5 with small posterior marginal  osteophytes. Degenerative joint space narrowing of both hips with spurring off the femoral head- neck junctures. No acute osseous abnormality. IMPRESSION: 1. Distended slightly thick-walled gallbladder with pericholecystic fluid concerning for acalculus cholecystitis. 2. Colonic diverticulosis without acute diverticulitis. 3. Fibroid uterus. 4. Subcentimeter left renal cysts too small for further characterize. Electronically Signed   By: Ashley Royalty M.D.   On: 10/07/2016 00:23   Ir Perc Cholecystostomy  Result Date: 10/08/2016 INDICATION: ACUTE CHOLECYSTITIS, NON OPERATIVE CANDIDATE EXAM: CHOLECYSTOSTOMY MEDICATIONS: 3.375 g Zosyn; The antibiotic was administered within an appropriate time frame prior to the initiation of the procedure. ANESTHESIA/SEDATION: Moderate (conscious) sedation was employed during this procedure. A total of Versed 2.0 mg and Fentanyl 100 mcg was administered intravenously. Moderate Sedation Time: 10 minutes. The patient's level of consciousness and vital signs were monitored continuously by radiology nursing throughout the procedure under my direct supervision. FLUOROSCOPY TIME:  Fluoroscopy Time:  30 seconds (3 mGy). COMPLICATIONS: None immediate. PROCEDURE: Informed written consent was  obtained from the patient after a thorough discussion of the procedural risks, benefits and alternatives. All questions were addressed. Maximal Sterile Barrier Technique was utilized including caps, mask, sterile gowns, sterile gloves, sterile drape, hand hygiene and skin antiseptic. A timeout was performed prior to the initiation of the procedure. Previous imaging reviewed. Preliminary ultrasound performed. Gallbladder demonstrated a right upper quadrant. Percutaneous transhepatic window was localized. Overlying skin marked. Under sterile conditions and local anesthesia, ultrasound percutaneous transhepatic access performed of the distended gallbladder. Needle position confirmed with ultrasound. Images obtained for documentation. There was return of bile. Sample sent for culture. Guidewire inserted followed by Accustick dilator set. Amplatz guidewire exchange performed. Tract dilatation performed to insert a 10 [place] drain. Retention loop formed in the gallbladder. Position confirmed with fluoroscopy. Additional 30 cc purulent bile aspirated. This decompressed the gallbladder by ultrasound. Catheter secured with a Prolene suture and connected to external gravity drainage bag. Sterile dressing applied. No immediate complication. Patient tolerated the procedure well. IMPRESSION: Successful ultrasound and fluoroscopic percutaneous transhepatic cholecystostomy. Electronically Signed   By: Jerilynn Mages.  Shick M.D.   On: 10/08/2016 11:36   Dg Chest Port 1 View  Result Date: 10/08/2016 CLINICAL DATA:  83 year old female with increasing shortness of breath. EXAM: PORTABLE CHEST 1 VIEW COMPARISON:  Chest radiograph dated 10/06/2016 FINDINGS: There is low lung volumes with stable eventration of the right hemidiaphragm. Left lung base density appears similar to prior radiograph and likely represents atelectatic changes. Infiltrate is not excluded. Clinical correlation is recommended. There is no pleural effusion or pneumothorax.  Stable cardiac silhouette. Left pectoral dual lead pacemaker wires. There is degenerative changes of the spine and shoulders. No acute fracture. IMPRESSION: No significant interval change. Left lung base density likely atelectatic changes. Developing infiltrate is not excluded. Clinical correlation is recommended. Electronically Signed   By: Anner Crete M.D.   On: 10/08/2016 01:11     LOS: 2 days   Oren Binet, MD  [facility] Pager:[phone]  If 7PM-7AM, please contact night-coverage [email or web address] Password [facility] 10/09/2016, 7:44 AM

## 2016-10-09 NOTE — Progress Notes (Signed)
Referring Physician(s):  Dr. Mat Carne  Supervising Physician: Markus Daft  Patient Status:  [facility] - In-pt  Chief Complaint:  Abdominal pain, acute cholecystitis  Subjective: Tired after getting OOB.  Just received pain medication. Daughter at bedside.   Allergies: Patient has no known allergies.  Medications: Prior to Admission medications   Medication Sig Start Date End Date Taking? Authorizing Provider  acetaminophen (TYLENOL) 500 MG tablet Take 500 mg by mouth every 6 (six) hours as needed for mild pain.   Yes Historical Provider, MD  aspirin EC 81 MG tablet Take 81 mg by mouth daily.   Yes Historical Provider, MD  Calcium-Magnesium-Vitamin D [id number] MG-MG-UNIT TABS Take 1 tablet by mouth daily.   Yes Historical Provider, MD  diclofenac (VOLTAREN) 75 MG EC tablet Take 1 tablet by mouth 2 (two) times daily as needed. 07/18/14  Yes Historical Provider, MD  losartan-hydrochlorothiazide (HYZAAR) 50-12.5 MG per tablet Take 1 tablet by mouth daily. 09/11/14  Yes Historical Provider, MD  meloxicam (MOBIC) 15 MG tablet Take 15 mg by mouth daily. 05/27/15  Yes Historical Provider, MD  metFORMIN (GLUCOPHAGE) 500 MG tablet Take 1 tablet by mouth 2 (two) times daily. 09/09/14  Yes Historical Provider, MD  Multiple Vitamin (MULTIVITAMIN WITH MINERALS) TABS tablet Take 1 tablet by mouth daily.   Yes Historical Provider, MD  nabumetone (RELAFEN) 500 MG tablet Take 500 mg by mouth 2 (two) times daily as needed for moderate pain. Reported on 11/20/2015 10/27/15  Yes Historical Provider, MD  [place] 2 drops into both eyes daily as needed (for dry eyes).   Yes Historical Provider, MD  Polyvinyl Alcohol-Povidone (REFRESH OP) Place 1 drop into both eyes 2 (two) times daily as needed (for dry eyes).   Yes Historical Provider, MD  potassium chloride SA (K-DUR,KLOR-CON) 20 MEQ tablet Take 20 mEq by mouth daily.   Yes Historical Provider, MD  valACYclovir (VALTREX) 1000 MG  tablet Take 1 g by mouth every 12 (twelve) hours as needed. For outbreaks 10/27/15  Yes Historical Provider, MD  Blood Glucose Monitoring Suppl (FREESTYLE LITE) DEVI  08/28/14   Historical Provider, MD  FREESTYLE LITE test strip  08/28/14   Historical Provider, MD  Lancets (FREESTYLE) lancets  08/28/14   Historical Provider, MD     Vital Signs: BP (!) 114/56 (BP Location: Right Arm)   Pulse 94   Temp 98 F (36.7 C) (Oral)   Resp (!) 25   Ht 5\' 2"  (1.575 m)   Wt 159 lb 6.3 oz (72.3 kg)   SpO2 100%   BMI 29.15 kg/m   Physical Exam  Alert, no acute distress Abdomen:  RUQ drain in place.  Insertion site c/d/i.  Moderately tender to palpation at insertion site.  Purulent, bilious output. Flushes easily with 66mL saline.   Imaging: Dg Chest 2 View  Result Date: 10/06/2016 CLINICAL DATA:  Mid chest pain for 2 days with shortness of breath EXAM: CHEST  2 VIEW COMPARISON:  04/04/2014 FINDINGS: Low volume film with stable asymmetric elevation right hemidiaphragm. The lungs are clear wiithout focal pneumonia, edema, pneumothorax or pleural effusion. Cardiopericardial silhouette is at upper limits of normal for size. Left-sided dual lead permanent pacemaker again noted. Degenerative changes are evident in the shoulders bilaterally. IMPRESSION: Stable.  No acute findings. Electronically Signed   By: Misty Stanley M.D.   On: 10/06/2016 18:53   Nm Hepatobiliary Including Gb  Result Date: 10/07/2016 CLINICAL DATA:  Acute acalculous cholecystitis EXAM:  NUCLEAR MEDICINE HEPATOBILIARY IMAGING TECHNIQUE: Sequential images of the abdomen were obtained out to 60 minutes following intravenous administration of radiopharmaceutical. RADIOPHARMACEUTICALS:  5.2 mCi Tc-37m  Choletec IV COMPARISON:  CT abdomen pelvis 10/06/2016 FINDINGS: There is good uptake by the liver. Small bowel activity is noted beginning at 10 minutes. No gallbladder activity was seen at 1 hour The patient received 2.0 mg of IV morphine in  attempt to visualize the gallbladder. Imaging was obtained for another 33 minutes at which time the patient refused further imaging. No gallbladder activity was demonstrated post morphine administration. IMPRESSION: Negative for gallbladder activity including after morphine administration. Findings consistent with cystic duct obstruction. Electronically Signed   By: Franchot Gallo M.D.   On: 10/07/2016 11:25   Ct Abdomen Pelvis W Contrast  Result Date: 10/07/2016 CLINICAL DATA:  Chest pain with nausea and vomiting. EXAM: CT ABDOMEN AND PELVIS WITH CONTRAST TECHNIQUE: Multidetector CT imaging of the abdomen and pelvis was performed using the standard protocol following bolus administration of intravenous contrast. CONTRAST:  168mL ISOVUE-300 IOPAMIDOL (ISOVUE-300) INJECTION 61% COMPARISON:  None. FINDINGS: Lower chest: Borderline cardiomegaly with right atrial and right ventricular pacing leads noted. No pericardial effusion. Bibasilar atelectasis. No pneumonic consolidation, effusion or pneumothorax. Hepatobiliary: Mild fatty infiltration of the liver without space-occupying mass. No biliary dilatation. Distended gallbladder with mild wall thickening up to 4 mm and pericholecystic fluid concerning for acalculus cholecystitis. No gallstones or choledocholithiasis. Pancreas: Unremarkable Spleen: No splenomegaly.  Small splenule at the hilum. Adrenals/Urinary Tract: Normal bilateral adrenal glands. Tiny too small to further characterize 5 mm left upper abdominal and interpolar and lower pole hypodensity statistically consistent with cysts. No hydroureteronephrosis. Urinary bladder is physiologically distended. Stomach/Bowel: Nondistended stomach. Normal small bowel rotation. No acute bowel obstruction or inflammation. Vascular/Lymphatic: Moderate aortoiliac atherosclerosis. No aneurysm. No lymphadenopathy. Reproductive: Fibroid uterus with a noncalcified intramural 12 mm in diameter fibroid with partially calcified  16 mm left-sided subserosal fibroid. A few calcifications are noted intramural in etiology on the left which may also reflect tiny calcified fibroids. Other: No abdominal wall hernia or abnormality. No abdominopelvic ascites. Musculoskeletal: Lumbar degenerative disc disease L2 through L5 with small posterior marginal osteophytes. Degenerative joint space narrowing of both hips with spurring off the femoral head- neck junctures. No acute osseous abnormality. IMPRESSION: 1. Distended slightly thick-walled gallbladder with pericholecystic fluid concerning for acalculus cholecystitis. 2. Colonic diverticulosis without acute diverticulitis. 3. Fibroid uterus. 4. Subcentimeter left renal cysts too small for further characterize. Electronically Signed   By: Ashley Royalty M.D.   On: 10/07/2016 00:23   Ir Perc Cholecystostomy  Result Date: 10/08/2016 INDICATION: ACUTE CHOLECYSTITIS, NON OPERATIVE CANDIDATE EXAM: CHOLECYSTOSTOMY MEDICATIONS: 3.375 g Zosyn; The antibiotic was administered within an appropriate time frame prior to the initiation of the procedure. ANESTHESIA/SEDATION: Moderate (conscious) sedation was employed during this procedure. A total of Versed 2.0 mg and Fentanyl 100 mcg was administered intravenously. Moderate Sedation Time: 10 minutes. The patient's level of consciousness and vital signs were monitored continuously by radiology nursing throughout the procedure under my direct supervision. FLUOROSCOPY TIME:  Fluoroscopy Time:  30 seconds (3 mGy). COMPLICATIONS: None immediate. PROCEDURE: Informed written consent was obtained from the patient after a thorough discussion of the procedural risks, benefits and alternatives. All questions were addressed. Maximal Sterile Barrier Technique was utilized including caps, mask, sterile gowns, sterile gloves, sterile drape, hand hygiene and skin antiseptic. A timeout was performed prior to the initiation of the procedure. Previous imaging reviewed. Preliminary  ultrasound performed. Gallbladder demonstrated  a right upper quadrant. Percutaneous transhepatic window was localized. Overlying skin marked. Under sterile conditions and local anesthesia, ultrasound percutaneous transhepatic access performed of the distended gallbladder. Needle position confirmed with ultrasound. Images obtained for documentation. There was return of bile. Sample sent for culture. Guidewire inserted followed by Accustick dilator set. Amplatz guidewire exchange performed. Tract dilatation performed to insert a 10 [place] drain. Retention loop formed in the gallbladder. Position confirmed with fluoroscopy. Additional 30 cc purulent bile aspirated. This decompressed the gallbladder by ultrasound. Catheter secured with a Prolene suture and connected to external gravity drainage bag. Sterile dressing applied. No immediate complication. Patient tolerated the procedure well. IMPRESSION: Successful ultrasound and fluoroscopic percutaneous transhepatic cholecystostomy. Electronically Signed   By: Jerilynn Mages.  Shick M.D.   On: 10/08/2016 11:36   Dg Chest Port 1 View  Result Date: 10/08/2016 CLINICAL DATA:  83 year old female with increasing shortness of breath. EXAM: PORTABLE CHEST 1 VIEW COMPARISON:  Chest radiograph dated 10/06/2016 FINDINGS: There is low lung volumes with stable eventration of the right hemidiaphragm. Left lung base density appears similar to prior radiograph and likely represents atelectatic changes. Infiltrate is not excluded. Clinical correlation is recommended. There is no pleural effusion or pneumothorax. Stable cardiac silhouette. Left pectoral dual lead pacemaker wires. There is degenerative changes of the spine and shoulders. No acute fracture. IMPRESSION: No significant interval change. Left lung base density likely atelectatic changes. Developing infiltrate is not excluded. Clinical correlation is recommended. Electronically Signed   By: Anner Crete M.D.   On: 10/08/2016 01:11     Labs:  CBC:  Recent Labs  10/06/16 1828 10/06/16 2241 10/07/16 0130 10/08/16 0646 10/09/16 0257  WBC 10.4  --  14.4* 14.0* 8.1  HGB 15.1* 16.3* 13.9 11.9* 11.6*  HCT 43.5 48.0* 40.9 35.9* 34.7*  PLT 244  --  252 174 147*    COAGS:  Recent Labs  10/08/16 0859  INR 1.52    BMP:  Recent Labs  10/07/16 0405 10/07/16 0604 10/08/16 0646 10/09/16 0257  NA 134* 135 135 137  K 3.5 3.6 3.1* 3.7  CL 101 101 102 104  CO2 20* 20* 21* 21*  GLUCOSE 226* 200* 216* 121*  BUN 9 10 11 15   CALCIUM 8.2* 8.4* 7.8* 7.5*  CREATININE 0.82 0.70 0.93 0.83  GFRNONAA >60 >60 53* >60  GFRAA >60 >60 >60 >60    LIVER FUNCTION TESTS:  Recent Labs  11/20/15 2007 10/07/16 0130 10/08/16 0646 10/09/16 0257  BILITOT 0.9 1.2 4.5* 5.7*  AST 26 27 157* 68*  ALT 13* 18 134* 91*  ALKPHOS 37* 35* 84 83  PROT 7.6 7.6 6.3* 6.5  ALBUMIN 4.1 3.9 2.9* 2.6*    Assessment and Plan: Acute cholecystitis s/p percutaneous cholecystostomy 10/08/16 by Dr. Annamaria Boots.  Drain functioning well.  Patient with some improvement since drain placed. No surgery planned during acute hospitalization. Will need follow-up in drain clinic. Discussed with daughter.  Cultures pending.  Continue drain care.  IR to follow.    Electronically Signed: Docia Barrier 10/09/2016, 10:17 AM   I spent a total of 15 Minutes at the the patient's bedside AND on the patient's hospital floor or unit, greater than 50% of which was counseling/coordinating care for acute cholecystitis.

## 2016-10-09 NOTE — Evaluation (Signed)
Physical Therapy Evaluation Patient Details Name: Maria [name] MRN: [id number] DOB: 13-Aug-1924 Today's Date: 10/09/2016   History of Present Illness  Pt is a 83 y/o female admitted secondary to chest pain, found to have acute cholecystitis. Pt is now s/p percutaneous cholecystostomy on 10/08/16. PMH including but not limited to breast cancer in 2015, HTN, DM and cardiac pacemaker placement in 2003.  Clinical Impression  Pt presented supine in bed with HOB elevated, awake and willing to participate in therapy session. Prior to admission, pt reported that she was mod I with ambulation using a SPC and independent with ADLs. Pt currently requires mod-max A x2 with bed mobility, mod A x2 for transfers and min A x2 for safety with ambulation using a RW. Pt would continue to benefit from skilled physical therapy services at this time while admitted and after d/c to address her below listed limitations in order to improve her overall safety and independence with functional mobility.      Follow Up Recommendations SNF;Supervision/Assistance - 24 hour    Equipment Recommendations  None recommended by PT;Other (comment) (defer to next venue)    Recommendations for Other Services       Precautions / Restrictions Precautions Precautions: Fall Precaution Comments: R abdominal drain Restrictions Weight Bearing Restrictions: No      Mobility  Bed Mobility Overal bed mobility: Needs Assistance Bed Mobility: Rolling;Sidelying to Sit;Sit to Sidelying Rolling: Mod assist Sidelying to sit: Mod assist;+2 for physical assistance;HOB elevated     Sit to sidelying: Max assist;+2 for physical assistance General bed mobility comments: pt required increased time, verbal and tactile cueing for sequencing, mod A at trunk to achieve sitting EOB with use of bed pads to position pt's hips at EOB. Max A x2 at bilateral LEs and trunk to return to supine.  Transfers Overall transfer level: Needs  assistance Equipment used: Rolling walker (2 wheeled) Transfers: Sit to/from Stand Sit to Stand: Mod assist;+2 physical assistance;+2 safety/equipment         General transfer comment: pt required increased time, verbal and tactile cues for bilateral hand placement and mod A x2 to achieve standing position, once from bed and once from [facility]  Ambulation/Gait Ambulation/Gait assistance: Min assist;+2 safety/equipment Ambulation Distance (Feet): 30 Feet Assistive device: Rolling walker (2 wheeled) Gait Pattern/deviations: Step-through pattern;Decreased step length - right;Decreased step length - left;Decreased stride length;Trunk flexed Gait velocity: decreased Gait velocity interpretation: Below normal speed for age/gender General Gait Details: pt with mild instability with ambulation, but no LOB. pt required constant assist with movement of RW and to maintain a safe distance from [employer] Mobility    Modified Rankin (Stroke Patients Only)       Balance Overall balance assessment: Needs assistance Sitting-balance support: Feet supported Sitting balance-Leahy Scale: Fair     Standing balance support: During functional activity;Bilateral upper extremity supported Standing balance-Leahy Scale: Poor Standing balance comment: pt reliant on bilateral UEs on RW                             Pertinent Vitals/Pain Pain Assessment: Faces Faces Pain Scale: Hurts even more Pain Location: abdomen Pain Descriptors / Indicators: Sore;Grimacing;Guarding Pain Intervention(s): Monitored during session;Repositioned;Limited activity within patient's tolerance    Home Living Family/patient expects to be discharged to:: Private residence Living Arrangements: Alone Available Help at Discharge: Family;Available PRN/intermittently Type of Home: [place]  Access: Level entry     Home Layout: One level Home Equipment: Cane - single point;Walker - 2  wheels      Prior Function Level of Independence: Independent with assistive device(s)         Comments: pt reported that she ambulated with use of SPC, independent with ADLs. Pt's daughter stated that she transported pt to all appointments.     Hand Dominance        Extremity/Trunk Assessment   Upper Extremity Assessment Upper Extremity Assessment: Generalized weakness    Lower Extremity Assessment Lower Extremity Assessment: Generalized weakness       Communication   Communication: No difficulties  Cognition Arousal/Alertness: Awake/alert Behavior During Therapy: WFL for tasks assessed/performed Overall Cognitive Status: Impaired/Different from baseline Area of Impairment: Following commands;Safety/judgement;Awareness;Problem solving       Following Commands: Follows one step commands consistently;Follows one step commands with increased time Safety/Judgement: Decreased awareness of safety   Problem Solving: Slow processing;Decreased initiation;Difficulty sequencing;Requires verbal cues;Requires tactile cues      General Comments      Exercises     Assessment/Plan    PT Assessment Patient needs continued PT services  PT Problem List Decreased strength;Decreased activity tolerance;Decreased mobility;Decreased balance;Decreased coordination;Decreased knowledge of use of DME;Decreased cognition;Decreased safety awareness;Pain          PT Treatment Interventions DME instruction;Gait training;Stair training;Functional mobility training;Therapeutic activities;Balance training;Therapeutic exercise;Neuromuscular re-education;Patient/family education    PT Goals (Current goals can be found in the Care Plan section)  Acute Rehab PT Goals Patient Stated Goal: decrease pain and to sleep PT Goal Formulation: With patient/family Time For Goal Achievement: 10/23/16 Potential to Achieve Goals: Fair    Frequency Min 3X/week   Barriers to discharge         Co-evaluation               End of Session Equipment Utilized During Treatment: Gait belt Activity Tolerance: Patient limited by pain;Patient limited by fatigue Patient left: in bed;with call bell/phone within reach;with family/visitor present Nurse Communication: Mobility status         Time: [id number] PT Time Calculation (min) (ACUTE ONLY): 29 min   Charges:   PT Evaluation $PT Eval Moderate Complexity: 1 Procedure PT Treatments $Gait Training: 8-22 mins   PT G CodesClearnce Sorrel Vonn Sliger 10/09/2016, 3:33 PM Sherie Don, [place], DPT [phone]

## 2016-10-09 NOTE — Progress Notes (Signed)
  Progress Note: General Surgery Service   Subjective: Drain placed yesterday, pain much improved, no nausea overnight  Objective: Vital signs in last 24 hours: Temp:  [96.9 F (36.1 C)-99 F (37.2 C)] 98 F (36.7 C) (01/27 0717) Pulse Rate:  [82-103] 94 (01/27 0717) Resp:  [17-29] 25 (01/27 0717) BP: (83-121)/(52-78) 114/56 (01/27 0717) SpO2:  [97 %-100 %] 100 % (01/27 0717) Last BM Date: 10/06/16  Intake/Output from previous day: 01/26 0701 - 01/27 0700 In: 2298 [I.V.:1878; IV Piggyback:415] Out: 40 [Drains:40] Intake/Output this shift: No intake/output data recorded.  Lungs: CTAB  Cardiovascular: RRR  Abd: soft, NT, ND, RUQ drain with bilious purulent drainage in bag  Extremities: no edema  Neuro: AOx4  Lab Results: CBC   Recent Labs  10/08/16 0646 10/09/16 0257  WBC 14.0* 8.1  HGB 11.9* 11.6*  HCT 35.9* 34.7*  PLT 174 147*   BMET  Recent Labs  10/08/16 0646 10/09/16 0257  NA 135 137  K 3.1* 3.7  CL 102 104  CO2 21* 21*  GLUCOSE 216* 121*  BUN 11 15  CREATININE 0.93 0.83  CALCIUM 7.8* 7.5*   PT/INR  Recent Labs  10/08/16 0859  LABPROT 18.5*  INR 1.52   ABG  Recent Labs  10/06/16 2123  HCO3 25.4    Studies/Results:  Anti-infectives: Anti-infectives    Start     Dose/Rate Route Frequency Ordered Stop   10/07/16 1000  piperacillin-tazobactam (ZOSYN) IVPB 3.375 g     3.375 g 12.5 mL/hr over 240 Minutes Intravenous Every 8 hours 10/07/16 0143     10/06/16 2345  piperacillin-tazobactam (ZOSYN) IVPB 3.375 g     3.375 g 100 mL/hr over 30 Minutes Intravenous  Once 10/06/16 2341 10/07/16 0200   10/06/16 2345  vancomycin (VANCOCIN) IVPB 1000 mg/200 mL premix  Status:  Discontinued     1,000 mg 200 mL/hr over 60 Minutes Intravenous  Once 10/06/16 2341 10/07/16 0046      Medications: Scheduled Meds: . enoxaparin (LOVENOX) injection  40 mg Subcutaneous Daily  . insulin aspart  0-9 Units Subcutaneous Q4H  . mouth rinse  15 mL Mouth  Rinse BID  . piperacillin-tazobactam (ZOSYN)  IV  3.375 g Intravenous Q8H  . sodium chloride flush  3 mL Intravenous Q12H   Continuous Infusions: . sodium chloride 50 mL/hr at 10/09/16 0758   PRN Meds:.acetaminophen, albuterol, metoprolol, morphine injection  Assessment/Plan: Patient Active Problem List   Diagnosis Date Noted  . Acute acalculous cholecystitis 10/07/2016  . Elevated troponin 10/07/2016  . Lactic acidosis 10/07/2016  . Sinus tachycardia 10/07/2016  . Dyslipidemia 06/05/2013  . Cardiac pacemaker 05/30/2013  . DM type 2 (diabetes mellitus, type 2) ([place]) 05/21/2013  . Chest pain 05/20/2013  . HTN (hypertension) 05/20/2013   S/p perc drain -advance to diabetic diet -continue drain management -no surgical intervention planned during hospitalization -follow up with Dr. Rosendo Gros in 6 weeks    LOS: 2 days   Mickeal Skinner, MD Pg# [phone] [facility]

## 2016-10-09 NOTE — Plan of Care (Signed)
Problem: Safety: Goal: Ability to remain free from injury will improve Outcome: Progressing Room kept free of clutter and safe. Patient always uses call light to ask for assistance in movement.   Problem: Pain Managment: Goal: General experience of comfort will improve Outcome: Progressing Patient able to sleep/ rest with use of ordered pain medication.   Problem: Activity: Goal: Risk for activity intolerance will decrease Outcome: Progressing Patient able to turn self, but encouraged to do so by staff. Patient very weak and sore at this time.   Problem: Fluid Volume: Goal: Ability to maintain a balanced intake and output will improve Outcome: Not Progressing Patient having sensation that she has to urinate multiple times during shift, but unable to do so. Bladder scanned at 0630 and only showing 275. Will make day shift aware.  Problem: Nutrition: Goal: Adequate nutrition will be maintained Outcome: Not Progressing Patient NPO with sips of water and ice chips at this time.

## 2016-10-10 LAB — BASIC METABOLIC PANEL
Anion gap: 11 (ref 5–15)
BUN: 16 mg/dL (ref 6–20)
CHLORIDE: 102 mmol/L (ref 101–111)
CO2: 21 mmol/L — AB (ref 22–32)
Calcium: 7.6 mg/dL — ABNORMAL LOW (ref 8.9–10.3)
Creatinine, Ser: 0.96 mg/dL (ref 0.44–1.00)
GFR calc non Af Amer: 51 mL/min — ABNORMAL LOW (ref >60)
GFR, EST AFRICAN AMERICAN: 59 mL/min — AB (ref >60)
Glucose, Bld: 129 mg/dL — ABNORMAL HIGH (ref 65–99)
POTASSIUM: 3.1 mmol/L — AB (ref 3.5–5.1)
Sodium: 134 mmol/L — ABNORMAL LOW (ref 135–145)

## 2016-10-10 LAB — GLUCOSE, CAPILLARY
GLUCOSE-CAPILLARY: 148 mg/dL — AB (ref 65–99)
GLUCOSE-CAPILLARY: 170 mg/dL — AB (ref 65–99)
GLUCOSE-CAPILLARY: 243 mg/dL — AB (ref 65–99)
Glucose-Capillary: 134 mg/dL — ABNORMAL HIGH (ref 65–99)
Glucose-Capillary: 140 mg/dL — ABNORMAL HIGH (ref 65–99)
Glucose-Capillary: 155 mg/dL — ABNORMAL HIGH (ref 65–99)
Glucose-Capillary: 274 mg/dL — ABNORMAL HIGH (ref 65–99)

## 2016-10-10 LAB — HEPATIC FUNCTION PANEL
ALBUMIN: 2.9 g/dL — AB (ref 3.5–5.0)
ALK PHOS: 115 U/L (ref 38–126)
ALT: 82 U/L — ABNORMAL HIGH (ref 14–54)
AST: 65 U/L — AB (ref 15–41)
BILIRUBIN TOTAL: 4 mg/dL — AB (ref 0.3–1.2)
Bilirubin, Direct: 1.8 mg/dL — ABNORMAL HIGH (ref 0.1–0.5)
Indirect Bilirubin: 2.2 mg/dL — ABNORMAL HIGH (ref 0.3–0.9)
Total Protein: 6.8 g/dL (ref 6.5–8.1)

## 2016-10-10 MED ORDER — ASPIRIN EC 81 MG PO TBEC
81.0000 mg | DELAYED_RELEASE_TABLET | Freq: Every day | ORAL | Status: DC
  Administered 2016-10-10 – 2016-10-12 (×3): 81 mg via ORAL
  Filled 2016-10-10 (×3): qty 1

## 2016-10-10 MED ORDER — HALOPERIDOL LACTATE 5 MG/ML IJ SOLN: 1.0000 mg | Freq: Once | INTRAMUSCULAR | Status: AC

## 2016-10-10 MED ORDER — LORAZEPAM 2 MG/ML IJ SOLN
0.5000 mg | Freq: Four times a day (QID) | INTRAMUSCULAR | Status: DC | PRN
  Administered 2016-10-10: 1 mg via INTRAVENOUS
  Filled 2016-10-10: qty 1

## 2016-10-10 MED ORDER — SODIUM CHLORIDE 0.9 % IV SOLN: 250.0000 mL | INTRAVENOUS | Status: DC | PRN

## 2016-10-10 MED ORDER — POTASSIUM CHLORIDE CRYS ER 20 MEQ PO TBCR
40.0000 meq | EXTENDED_RELEASE_TABLET | Freq: Two times a day (BID) | ORAL | Status: DC
  Administered 2016-10-10 – 2016-10-12 (×5): 40 meq via ORAL
  Filled 2016-10-10 (×5): qty 2

## 2016-10-10 MED ORDER — POTASSIUM CHLORIDE CRYS ER 20 MEQ PO TBCR: 40.0000 meq | EXTENDED_RELEASE_TABLET | Freq: Once | ORAL | Status: DC

## 2016-10-10 MED ORDER — SODIUM CHLORIDE 0.9% FLUSH
3.0000 mL | Freq: Two times a day (BID) | INTRAVENOUS | Status: DC
  Administered 2016-10-10 (×2): 3 mL via INTRAVENOUS

## 2016-10-10 MED ORDER — FUROSEMIDE 10 MG/ML IJ SOLN
40.0000 mg | Freq: Once | INTRAMUSCULAR | Status: AC
  Administered 2016-10-10: 40 mg via INTRAVENOUS
  Filled 2016-10-10: qty 4

## 2016-10-10 MED ORDER — SODIUM CHLORIDE 0.9% FLUSH: 3.0000 mL | INTRAVENOUS | Status: DC | PRN

## 2016-10-10 MED ORDER — HALOPERIDOL LACTATE 5 MG/ML IJ SOLN
1.0000 mg | Freq: Once | INTRAMUSCULAR | Status: AC
  Administered 2016-10-10: 1 mg via INTRAMUSCULAR
  Filled 2016-10-10: qty 1

## 2016-10-10 MED ORDER — OXYCODONE-ACETAMINOPHEN 5-325 MG PO TABS: 1.0000 | ORAL_TABLET | Freq: Four times a day (QID) | ORAL | Status: DC | PRN

## 2016-10-10 NOTE — NC FL2 (Signed)
[place] LEVEL OF CARE SCREENING TOOL     IDENTIFICATION  Patient Name: Maria [name] Birthdate: 08/30/1924 Sex: female Admission Date (Current Location): 10/06/2016  [facility] and [place] Number:  Herbalist and Address:  [facility] [place], [place] [address] [place], [place] [address]      Provider Number: [id number]  Attending Physician Name and Address:  Jonetta Osgood, MD  Relative Name and Phone Number:       Current Level of Care: Hospital Recommended Level of Care: [place] Prior Approval Number:    Date Approved/Denied:   PASRR Number: [id number]  Discharge Plan: SNF    Current Diagnoses: Patient Active Problem List   Diagnosis Date Noted  . Acute acalculous cholecystitis 10/07/2016  . Elevated troponin 10/07/2016  . Lactic acidosis 10/07/2016  . Sinus tachycardia 10/07/2016  . Dyslipidemia 06/05/2013  . Cardiac pacemaker 05/30/2013  . DM type 2 (diabetes mellitus, type 2) ([place]) 05/21/2013  . Chest pain 05/20/2013  . HTN (hypertension) 05/20/2013    Orientation RESPIRATION BLADDER Height & Weight     Self, Place  O2 (2L) Continent Weight: 166 lb 10.7 oz (75.6 kg) Height:  5\' 2"  (157.5 cm)  BEHAVIORAL SYMPTOMS/MOOD NEUROLOGICAL BOWEL NUTRITION STATUS      Continent Diet (Carb Modified with Thin Liquids)  AMBULATORY STATUS COMMUNICATION OF NEEDS Skin   Extensive Assist Verbally Normal                       Personal Care Assistance Level of Assistance  Bathing, Feeding, Dressing Bathing Assistance: Limited assistance Feeding assistance: Independent Dressing Assistance: Limited assistance     Functional Limitations Info  Sight, Hearing, Speech Sight Info: Adequate Hearing Info: Adequate Speech Info: Adequate    SPECIAL CARE FACTORS FREQUENCY  PT (By licensed PT), OT (By licensed OT)     PT Frequency: 3x week OT Frequency: 3x week            Contractures Contractures Info:  Not present    Additional Factors Info  Code Status, Allergies, Insulin Sliding Scale Code Status Info: Full Code Allergies Info: No Known Allergies   Insulin Sliding Scale Info: Novolog every 4 hours       Current Medications (10/10/2016):  This is the current hospital active medication list Current Facility-Administered Medications  Medication Dose Route Frequency Provider Last Rate Last Dose  . 0.9 %  sodium chloride infusion  250 mL Intravenous PRN Jonetta Osgood, MD      . acetaminophen (TYLENOL) tablet 500 mg  500 mg Oral Q6H PRN Etta Quill, DO   500 mg at 10/09/16 1937  . albuterol (PROVENTIL) (2.5 MG/3ML) 0.083% nebulizer solution 2.5 mg  2.5 mg Nebulization Q2H PRN Jonetta Osgood, MD      . aspirin EC tablet 81 mg  81 mg Oral Daily Jonetta Osgood, MD   81 mg at 10/10/16 1331  . enoxaparin (LOVENOX) injection 40 mg  40 mg Subcutaneous Daily Etta Quill, DO   40 mg at 10/10/16 1053  . insulin aspart (novoLOG) injection 0-9 Units  0-9 Units Subcutaneous Q4H Etta Quill, DO   2 Units at 10/10/16 1331  . MEDLINE mouth rinse  15 mL Mouth Rinse BID Jonetta Osgood, MD   15 mL at 10/09/16 2138  . metoprolol (LOPRESSOR) injection 5 mg  5 mg Intravenous Q5 min PRN Etta Quill, DO      .  oxyCODONE-acetaminophen (PERCOCET/ROXICET) 5-325 MG per tablet 1-2 tablet  1-2 tablet Oral Q6H PRN Jonetta Osgood, MD      . piperacillin-tazobactam (ZOSYN) IVPB 3.375 g  3.375 g Intravenous Q8H Franky Macho, RPH   3.375 g at 10/10/16 0616  . potassium chloride SA (K-DUR,KLOR-CON) CR tablet 40 mEq  40 mEq Oral BID Jonetta Osgood, MD   40 mEq at 10/10/16 1052  . sodium chloride flush (NS) 0.9 % injection 3 mL  3 mL Intravenous Q12H Etta Quill, DO   3 mL at 10/10/16 1053  . sodium chloride flush (NS) 0.9 % injection 3 mL  3 mL Intravenous Q12H Shanker Kristeen Mans, MD      . sodium chloride flush (NS) 0.9 % injection 3 mL  3 mL Intravenous PRN Shanker Kristeen Mans, MD          Discharge Medications: Please see discharge summary for a list of discharge medications.  Relevant Imaging Results:  Relevant Lab Results:   Additional Information SSN [id number]   Barbette Or, [place]

## 2016-10-10 NOTE — Progress Notes (Addendum)
PROGRESS NOTE        PATIENT DETAILS Name: Maria [name] Age: 83 y.o. Sex: female Date of Birth: 12/24/1923 Admit Date: 10/06/2016 Admitting Physician Etta Quill, DO [id number] Alroy Dust, MD  Brief Narrative: Patient is a 83 y.o. female with history of complete heart block-status post permanent pacemaker implantation, hypertension, type 2 diabetes admitted with mostly right upper quadrant pain, vomiting-thought to have acute acalculous cholecystitis. Patient was subsequently admitted for further evaluation and treatment  Subjective: Slightly confused this morning-easily redirectable. Got morphine earlier this morning. Has been tolerating regular diet.  Assessment/Plan: Acute cholecystitis:Improved, underwent percutaneous drain placement on 1/26. Seen by cards and thought to be a high risk operative candidate. Bile cultures positive for pansensitive Klebsiella, continue empiric antimicrobial therapy-suspect we could narrow to Augmentin in the next day or so. LFTs including bilirubin are downtrending, no choledocholithiasis or CBD dilatation seen on CT scan of the abdomen. Spoke with radiology PA-Kelly yesterday-unable to do cholangiogram through the existing gallbladder drain as cystic duct is not patent on HIDA scan. I highly doubt that this patient has choledocholithiasis, plans are to continue to monitor closely-please note unable to pursue MRCP due to pacemaker in place.   Systemic inflammatory response syndrome: SIRS pathophysiology has resolved. Likely secondary to above, hemodynamics remain stable-doubt sepsis syndrome. Continue empiric antimicrobial therapy. Blood cultures negative so far, biliary cx positive for pansensitive Klebsiella   Mild delirium: Supportive care-suspect secondary to narcotics, probable mild cognitive dysfunction at baseline.  Minimally elevated troponins: Trend is mostly flat-I suspect this is demand ischemia rather than ACS. She does  not have chest pain-pain is mostly in the right upper quadrant abdominal area. EKG shows a paced rhythm. Echo shows preserved EF with severe hypokinesis of the apex and anetero-sepal myocardium. Suspect further work up can be pursued in the outpatient setting-but with advance age-best managed medically. Since diet stable-will start ASA.  Asymptomatic bacteriuria: UA consistent with UTI-however patient symptoms are mostly consistent with cholecystitis. On antimicrobial therapy in any event that should cover  Type 2 diabetes: CBGs relatively stable with SSI-continue to hold oral hypoglycemic agents  Hypertension: Blood pressure relatively stable-continue to hold oral antihypertensives-agree with using as needed IV Lopressor for elevated blood pressure readings.  History of complete heart block-has permanent pacemaker in place  Severe Pul HTN: seen on Echo-further work-in the outpatient setting-once recovered from acute illness.   Generalized weakness/deconditioning: Secondary to acute illness-underlying frailty/advanced age-PT evaluation completed-plans are for SNF on discharge. Have asked RN to make social work aware of discharge plans to SNF.  DVT Prophylaxis: Prophylactic Lovenox  Code Status: Full code  Family Communication: None at bedside  Disposition Plan: Remain inpatient-may require on Home health vs SNF on discharge-suspect d/c in the next 1-2 days. Transfer to [facility] today.  Antimicrobial agents: Anti-infectives    Start     Dose/Rate Route Frequency Ordered Stop   10/07/16 1000  piperacillin-tazobactam (ZOSYN) IVPB 3.375 g     3.375 g 12.5 mL/hr over 240 Minutes Intravenous Every 8 hours 10/07/16 0143     10/06/16 2345  piperacillin-tazobactam (ZOSYN) IVPB 3.375 g     3.375 g 100 mL/hr over 30 Minutes Intravenous  Once 10/06/16 2341 10/07/16 0200   10/06/16 2345  vancomycin (VANCOCIN) IVPB 1000 mg/200 mL premix  Status:  Discontinued     1,000 mg 200 mL/hr over 60  Minutes Intravenous  Once 10/06/16 2341 10/07/16 0046      Procedures: Echo 1/25>>Study Conclusions - Left ventricle: The cavity size was normal. Wall thickness was   increased in a pattern of mild LVH. Systolic function was normal.   The estimated ejection fraction was in the range of 50% to 55%.   Severe hypokinesis of the apical myocardium. Hypokinesis of the   anteroseptal myocardium. Doppler parameters are consistent with   abnormal left ventricular relaxation (grade 1 diastolic   dysfunction). - Ventricular septum: The contour showed systolic flattening. These   changes are consistent with RV pressure overload. - Mitral valve: There was mild regurgitation. - Right ventricle: The cavity size was mildly dilated. Wall   thickness was normal. - Right atrium: The atrium was mildly dilated. - Tricuspid valve: There was moderate-severe regurgitation. - Pulmonary arteries: Systolic pressure was severely increased. PA   peak pressure: 81 mm Hg (S).  CONSULTS:  cardiology and general surgery  Time spent: 25 minutes-Greater than 50% of this time was spent in counseling, explanation of diagnosis, planning of further management, and coordination of care.  MEDICATIONS: Scheduled Meds: . enoxaparin (LOVENOX) injection  40 mg Subcutaneous Daily  . insulin aspart  0-9 Units Subcutaneous Q4H  . mouth rinse  15 mL Mouth Rinse BID  . piperacillin-tazobactam (ZOSYN)  IV  3.375 g Intravenous Q8H  . potassium chloride  40 mEq Oral BID  . sodium chloride flush  3 mL Intravenous Q12H  . sodium chloride flush  3 mL Intravenous Q12H   Continuous Infusions:  PRN Meds:.sodium chloride, acetaminophen, albuterol, metoprolol, oxyCODONE-acetaminophen, sodium chloride flush   PHYSICAL EXAM: Vital signs: Vitals:   10/09/16 2000 10/09/16 2317 10/10/16 0319 10/10/16 0835  BP: 120/61 121/78 (!) 145/89   Pulse: 95 86 (!) 102 (!) 115  Resp: (!) 26 (!) 22 16   Temp:  97.7 F (36.5 C) 97.6 F (36.4  C) 97.3 F (36.3 C)  TempSrc:  Oral Oral Oral  SpO2:  100% 99%   Weight:   75.6 kg (166 lb 10.7 oz)   Height:       Filed Weights   10/07/16 0606 10/10/16 0319  Weight: 72.3 kg (159 lb 6.3 oz) 75.6 kg (166 lb 10.7 oz)   Body mass index is 30.48 kg/m.   General appearance :Awake, alert-slightly lethargic. Speech Clear. Not toxic Looking Eyes:, pupils equally reactive to light and accomodation,no scleral icterus. HEENT: Atraumatic and Normocephalic Neck: supple, no JVD. No cervical lymphadenopathy. No thyromegaly Resp:Good air entry bilaterally, no added sounds  CVS: S1 S2 regular, no murmurs.  GI: Bowel sounds present, minimally tender RUQ area Extremities: B/L Lower Ext shows no edema, both legs are warm to touch Neurology:  speech clear,Non focal, sensation is grossly intact. Psychiatric: Normal judgment and insight. Alert and oriented x 3. Normal mood. Musculoskeletal:No digital cyanosis Skin:No Rash, warm and dry Wounds:N/A  I have personally reviewed following labs and imaging studies  LABORATORY DATA: CBC:  Recent Labs Lab 10/06/16 1828 10/06/16 2241 10/07/16 0130 10/08/16 0646 10/09/16 0257  WBC 10.4  --  14.4* 14.0* 8.1  HGB 15.1* 16.3* 13.9 11.9* 11.6*  HCT 43.5 48.0* 40.9 35.9* 34.7*  MCV 83.5  --  84.0 83.9 84.8  PLT 244  --  252 174 147*    Basic Metabolic Panel:  Recent Labs Lab 10/07/16 0405 10/07/16 0604 10/08/16 0646 10/09/16 0257 10/10/16 0344  NA 134* 135 135 137 134*  K 3.5 3.6 3.1* 3.7 3.1*  CL 101 101 102 104 102  CO2 20* 20* 21* 21* 21*  GLUCOSE 226* 200* 216* 121* 129*  BUN 9 10 11 15 16   CREATININE 0.82 0.70 0.93 0.83 0.96  CALCIUM 8.2* 8.4* 7.8* 7.5* 7.6*    GFR: Estimated Creatinine Clearance: 37.1 mL/min (by C-G formula based on SCr of 0.96 mg/dL).  Liver Function Tests:  Recent Labs Lab 10/07/16 0130 10/08/16 0646 10/09/16 0257 10/10/16 0344  AST 27 157* 68* 65*  ALT 18 134* 91* 82*  ALKPHOS 35* 84 83 115    BILITOT 1.2 4.5* 5.7* 4.0*  PROT 7.6 6.3* 6.5 6.8  ALBUMIN 3.9 2.9* 2.6* 2.9*    Recent Labs Lab 10/07/16 0130 10/08/16 1002  LIPASE <10* 14    Recent Labs Lab 10/08/16 1002  AMMONIA 47*    Coagulation Profile:  Recent Labs Lab 10/08/16 0859  INR 1.52    Cardiac Enzymes:  Recent Labs Lab 10/07/16 0405 10/07/16 0604 10/07/16 1258  TROPONINI 0.27* 0.29* 0.34*    BNP (last 3 results) No results for input(s): PROBNP in the last 8760 hours.  HbA1C: No results for input(s): HGBA1C in the last 72 hours.  CBG:  Recent Labs Lab 10/09/16 1645 10/09/16 1922 10/09/16 2315 10/10/16 0352 10/10/16 0837  GLUCAP 129* 180* 133* 134* 148*    Lipid Profile: No results for input(s): CHOL, HDL, LDLCALC, TRIG, CHOLHDL, LDLDIRECT in the last 72 hours.  Thyroid Function Tests: No results for input(s): TSH, T4TOTAL, FREET4, T3FREE, THYROIDAB in the last 72 hours.  Anemia Panel: No results for input(s): VITAMINB12, FOLATE, FERRITIN, TIBC, IRON, RETICCTPCT in the last 72 hours.  Urine analysis:    Component Value Date/Time   COLORURINE YELLOW 10/07/2016 0034   APPEARANCEUR HAZY (A) 10/07/2016 0034   LABSPEC 1.032 (H) 10/07/2016 0034   PHURINE 6.0 10/07/2016 0034   GLUCOSEU >=500 (A) 10/07/2016 0034   HGBUR SMALL (A) 10/07/2016 0034   BILIRUBINUR NEGATIVE 10/07/2016 0034   KETONESUR 20 (A) 10/07/2016 0034   PROTEINUR 100 (A) 10/07/2016 0034   NITRITE NEGATIVE 10/07/2016 0034   LEUKOCYTESUR TRACE (A) 10/07/2016 0034    Sepsis Labs: Lactic Acid, Venous    Component Value Date/Time   LATICACIDVEN 3.85 (HH) 10/06/2016 2335    MICROBIOLOGY: Recent Results (from the past 240 hour(s))  Blood culture (routine x 2)     Status: None (Preliminary result)   Collection Time: 10/07/16 12:20 AM  Result Value Ref Range Status   Specimen Description BLOOD RIGHT ANTECUBITAL  Final   Special Requests IN PEDIATRIC BOTTLE 2CC  Final   Culture NO GROWTH 2 DAYS  Final    Report Status PENDING  Incomplete  Urine culture     Status: Abnormal   Collection Time: 10/07/16 12:34 AM  Result Value Ref Range Status   Specimen Description URINE, RANDOM  Final   Special Requests NONE  Final   Culture >=100,000 COLONIES/mL ESCHERICHIA COLI (A)  Final   Report Status 10/09/2016 FINAL  Final   Organism ID, Bacteria ESCHERICHIA COLI (A)  Final      Susceptibility   Escherichia coli - MIC*    AMPICILLIN <=2 SENSITIVE Sensitive     CEFAZOLIN <=4 SENSITIVE Sensitive     CEFTRIAXONE <=1 SENSITIVE Sensitive     CIPROFLOXACIN <=0.25 SENSITIVE Sensitive     GENTAMICIN <=1 SENSITIVE Sensitive     IMIPENEM <=0.25 SENSITIVE Sensitive     NITROFURANTOIN <=16 SENSITIVE Sensitive     TRIMETH/SULFA <=20 SENSITIVE Sensitive  AMPICILLIN/SULBACTAM <=2 SENSITIVE Sensitive     PIP/TAZO <=4 SENSITIVE Sensitive     Extended ESBL NEGATIVE Sensitive     * >=100,000 COLONIES/mL ESCHERICHIA COLI  Blood culture (routine x 2)     Status: None (Preliminary result)   Collection Time: 10/07/16  1:30 AM  Result Value Ref Range Status   Specimen Description BLOOD LEFT ANTECUBITAL  Final   Special Requests BOTTLES DRAWN AEROBIC AND ANAEROBIC 10CC  Final   Culture NO GROWTH 2 DAYS  Final   Report Status PENDING  Incomplete  MRSA PCR Screening     Status: None   Collection Time: 10/07/16  6:03 AM  Result Value Ref Range Status   MRSA by PCR NEGATIVE NEGATIVE Final    Comment:        The GeneXpert MRSA Assay (FDA approved for NASAL specimens only), is one component of a comprehensive MRSA colonization surveillance program. It is not intended to diagnose MRSA infection nor to guide or monitor treatment for MRSA infections.   Aerobic/Anaerobic Culture (surgical/deep wound)     Status: None (Preliminary result)   Collection Time: 10/08/16 11:41 AM  Result Value Ref Range Status   Specimen Description GALL BLADDER  Final   Special Requests BILE  Final   Gram Stain   Final    ABUNDANT  WBC PRESENT, PREDOMINANTLY PMN ABUNDANT GRAM NEGATIVE RODS    Culture MODERATE KLEBSIELLA PNEUMONIAE  Final   Report Status PENDING  Incomplete   Organism ID, Bacteria KLEBSIELLA PNEUMONIAE  Final      Susceptibility   Klebsiella pneumoniae - MIC*    AMPICILLIN 16 RESISTANT Resistant     CEFAZOLIN <=4 SENSITIVE Sensitive     CEFEPIME <=1 SENSITIVE Sensitive     CEFTAZIDIME <=1 SENSITIVE Sensitive     CEFTRIAXONE <=1 SENSITIVE Sensitive     CIPROFLOXACIN <=0.25 SENSITIVE Sensitive     GENTAMICIN <=1 SENSITIVE Sensitive     IMIPENEM <=0.25 SENSITIVE Sensitive     TRIMETH/SULFA <=20 SENSITIVE Sensitive     AMPICILLIN/SULBACTAM <=2 SENSITIVE Sensitive     PIP/TAZO <=4 SENSITIVE Sensitive     Extended ESBL NEGATIVE Sensitive     * MODERATE KLEBSIELLA PNEUMONIAE    RADIOLOGY STUDIES/RESULTS: Dg Chest 2 View  Result Date: 10/06/2016 CLINICAL DATA:  Mid chest pain for 2 days with shortness of breath EXAM: CHEST  2 VIEW COMPARISON:  04/04/2014 FINDINGS: Low volume film with stable asymmetric elevation right hemidiaphragm. The lungs are clear wiithout focal pneumonia, edema, pneumothorax or pleural effusion. Cardiopericardial silhouette is at upper limits of normal for size. Left-sided dual lead permanent pacemaker again noted. Degenerative changes are evident in the shoulders bilaterally. IMPRESSION: Stable.  No acute findings. Electronically Signed   By: Misty Stanley M.D.   On: 10/06/2016 18:53   Nm Hepatobiliary Including Gb  Result Date: 10/07/2016 CLINICAL DATA:  Acute acalculous cholecystitis EXAM: NUCLEAR MEDICINE HEPATOBILIARY IMAGING TECHNIQUE: Sequential images of the abdomen were obtained out to 60 minutes following intravenous administration of radiopharmaceutical. RADIOPHARMACEUTICALS:  5.2 mCi Tc-37m  Choletec IV COMPARISON:  CT abdomen pelvis 10/06/2016 FINDINGS: There is good uptake by the liver. Small bowel activity is noted beginning at 10 minutes. No gallbladder activity was  seen at 1 hour The patient received 2.0 mg of IV morphine in attempt to visualize the gallbladder. Imaging was obtained for another 33 minutes at which time the patient refused further imaging. No gallbladder activity was demonstrated post morphine administration. IMPRESSION: Negative for gallbladder activity including after morphine  administration. Findings consistent with cystic duct obstruction. Electronically Signed   By: Franchot Gallo M.D.   On: 10/07/2016 11:25   Ct Abdomen Pelvis W Contrast  Result Date: 10/07/2016 CLINICAL DATA:  Chest pain with nausea and vomiting. EXAM: CT ABDOMEN AND PELVIS WITH CONTRAST TECHNIQUE: Multidetector CT imaging of the abdomen and pelvis was performed using the standard protocol following bolus administration of intravenous contrast. CONTRAST:  115mL ISOVUE-300 IOPAMIDOL (ISOVUE-300) INJECTION 61% COMPARISON:  None. FINDINGS: Lower chest: Borderline cardiomegaly with right atrial and right ventricular pacing leads noted. No pericardial effusion. Bibasilar atelectasis. No pneumonic consolidation, effusion or pneumothorax. Hepatobiliary: Mild fatty infiltration of the liver without space-occupying mass. No biliary dilatation. Distended gallbladder with mild wall thickening up to 4 mm and pericholecystic fluid concerning for acalculus cholecystitis. No gallstones or choledocholithiasis. Pancreas: Unremarkable Spleen: No splenomegaly.  Small splenule at the hilum. Adrenals/Urinary Tract: Normal bilateral adrenal glands. Tiny too small to further characterize 5 mm left upper abdominal and interpolar and lower pole hypodensity statistically consistent with cysts. No hydroureteronephrosis. Urinary bladder is physiologically distended. Stomach/Bowel: Nondistended stomach. Normal small bowel rotation. No acute bowel obstruction or inflammation. Vascular/Lymphatic: Moderate aortoiliac atherosclerosis. No aneurysm. No lymphadenopathy. Reproductive: Fibroid uterus with a noncalcified  intramural 12 mm in diameter fibroid with partially calcified 16 mm left-sided subserosal fibroid. A few calcifications are noted intramural in etiology on the left which may also reflect tiny calcified fibroids. Other: No abdominal wall hernia or abnormality. No abdominopelvic ascites. Musculoskeletal: Lumbar degenerative disc disease L2 through L5 with small posterior marginal osteophytes. Degenerative joint space narrowing of both hips with spurring off the femoral head- neck junctures. No acute osseous abnormality. IMPRESSION: 1. Distended slightly thick-walled gallbladder with pericholecystic fluid concerning for acalculus cholecystitis. 2. Colonic diverticulosis without acute diverticulitis. 3. Fibroid uterus. 4. Subcentimeter left renal cysts too small for further characterize. Electronically Signed   By: Ashley Royalty M.D.   On: 10/07/2016 00:23   Ir Perc Cholecystostomy  Result Date: 10/08/2016 INDICATION: ACUTE CHOLECYSTITIS, NON OPERATIVE CANDIDATE EXAM: CHOLECYSTOSTOMY MEDICATIONS: 3.375 g Zosyn; The antibiotic was administered within an appropriate time frame prior to the initiation of the procedure. ANESTHESIA/SEDATION: Moderate (conscious) sedation was employed during this procedure. A total of Versed 2.0 mg and Fentanyl 100 mcg was administered intravenously. Moderate Sedation Time: 10 minutes. The patient's level of consciousness and vital signs were monitored continuously by radiology nursing throughout the procedure under my direct supervision. FLUOROSCOPY TIME:  Fluoroscopy Time:  30 seconds (3 mGy). COMPLICATIONS: None immediate. PROCEDURE: Informed written consent was obtained from the patient after a thorough discussion of the procedural risks, benefits and alternatives. All questions were addressed. Maximal Sterile Barrier Technique was utilized including caps, mask, sterile gowns, sterile gloves, sterile drape, hand hygiene and skin antiseptic. A timeout was performed prior to the  initiation of the procedure. Previous imaging reviewed. Preliminary ultrasound performed. Gallbladder demonstrated a right upper quadrant. Percutaneous transhepatic window was localized. Overlying skin marked. Under sterile conditions and local anesthesia, ultrasound percutaneous transhepatic access performed of the distended gallbladder. Needle position confirmed with ultrasound. Images obtained for documentation. There was return of bile. Sample sent for culture. Guidewire inserted followed by Accustick dilator set. Amplatz guidewire exchange performed. Tract dilatation performed to insert a 10 [place] drain. Retention loop formed in the gallbladder. Position confirmed with fluoroscopy. Additional 30 cc purulent bile aspirated. This decompressed the gallbladder by ultrasound. Catheter secured with a Prolene suture and connected to external gravity drainage bag. Sterile dressing applied. No immediate complication.  Patient tolerated the procedure well. IMPRESSION: Successful ultrasound and fluoroscopic percutaneous transhepatic cholecystostomy. Electronically Signed   By: Jerilynn Mages.  Shick M.D.   On: 10/08/2016 11:36   Dg Chest Port 1 View  Result Date: 10/08/2016 CLINICAL DATA:  82 year old female with increasing shortness of breath. EXAM: PORTABLE CHEST 1 VIEW COMPARISON:  Chest radiograph dated 10/06/2016 FINDINGS: There is low lung volumes with stable eventration of the right hemidiaphragm. Left lung base density appears similar to prior radiograph and likely represents atelectatic changes. Infiltrate is not excluded. Clinical correlation is recommended. There is no pleural effusion or pneumothorax. Stable cardiac silhouette. Left pectoral dual lead pacemaker wires. There is degenerative changes of the spine and shoulders. No acute fracture. IMPRESSION: No significant interval change. Left lung base density likely atelectatic changes. Developing infiltrate is not excluded. Clinical correlation is recommended.  Electronically Signed   By: Anner Crete M.D.   On: 10/08/2016 01:11     LOS: 3 days   Oren Binet, MD  [facility] Pager:[phone]  If 7PM-7AM, please contact night-coverage [email or web address] Password [email or web address] 10/10/2016, 11:10 AM

## 2016-10-10 NOTE — Clinical Social Work Note (Signed)
Clinical Social Work Assessment  Patient Details  Name: Maria [name] MRN: [id number] Date of Birth: 15-Feb-1924  Date of referral:  10/10/16               Reason for consult:  Facility Placement                Permission sought to share information with:  Family Supports Permission granted to share information::  Yes, Verbal Permission Granted  Name::     Gwenlyn Saran  Relationship::  Daughter  Contact Information:  [phone]  Housing/Transportation Living arrangements for the past 2 months:  [place] of Information:  Patient Patient Interpreter Needed:  None Criminal Activity/Legal Involvement Pertinent to Current Situation/Hospitalization:  No - Comment as needed Significant Relationships:  Adult Children Lives with:  Self Do you feel safe going back to the place where you live?  Yes Need for family participation in patient care:  Yes (Comment)  Care giving concerns:  Patient daughter states that patient lives alone and no family able to care for patient 24/7.  Patient daughter agreeable with SNF placement.   Social Worker assessment / plan:  Holiday representative met with patient and patient daughter to offer support and discuss patient needs at discharge.  Patient is slightly confused, therefore patient daughter is able to communicate that patient is from home alone and will need SNF placement at discharge.  Patient and patient daughter live in [place] and would prefer to stay in the area if possible.  Patient daughter is aware of [facility], but would like a full list of offers prior to deciding on a facility.  CSW to initiate referral and follow up with patient and family once offers available.  CSW remains available for support and to facilitate patient discharge needs once medically stable.  Employment status:  Retired Forensic scientist:  Medicare PT Recommendations:  [place] / Referral to  community resources:  [place]  Patient/Family's Response to care:  Patient and daughter verbalized understanding of CSW role and appreciation for support and concern.  Patient and patient daughter agreeable to SNF placement in [place].  Patient/Family's Understanding of and Emotional Response to Diagnosis, Current Treatment, and Prognosis:  Patient and family understanding of patient barriers and limitations to return home.  Patient daughter is hopeful for short term rehab and return home - patient and patient daughter are not looking for long term care at this time.  Emotional Assessment Appearance:  Appears stated age Attitude/Demeanor/Rapport:  Inconsistent Affect (typically observed):  Adaptable, Calm, Hopeful, Pleasant Orientation:  Oriented to Self, Oriented to Place Alcohol / Substance use:  Not Applicable Psych involvement (Current and /or in the community):  No (Comment)  Discharge Needs  Concerns to be addressed:  Discharge Planning Concerns Readmission within the last 30 days:  No Current discharge risk:  Physical Impairment Barriers to Discharge:  Continued Medical Work up  [employer], [place]

## 2016-10-10 NOTE — Clinical Social Work Placement (Signed)
   CLINICAL SOCIAL WORK PLACEMENT  NOTE  Date:  10/10/2016  Patient Details  Name: Maria [name] MRN: [id number] Date of Birth: 17-Oct-1923  Clinical Social Work is seeking post-discharge placement for this patient at the [place] level of care (*CSW will initial, date and re-position this form in  chart as items are completed):  Yes   Patient/family provided with [place] Work Department's list of facilities offering this level of care within the geographic area requested by the patient (or if unable, by the patient's family).  Yes   Patient/family informed of their freedom to choose among providers that offer the needed level of care, that participate in Medicare, Medicaid or managed care program needed by the patient, have an available bed and are willing to accept the patient.  Yes   Patient/family informed of [facility]'s ownership interest in [facility] and [facility], as well as of the fact that they are under no obligation to receive care at these facilities.  PASRR submitted to EDS on 10/10/16     PASRR number received on 10/10/16     Existing PASRR number confirmed on       FL2 transmitted to all facilities in geographic area requested by pt/family on 10/10/16     FL2 transmitted to all facilities within larger geographic area on       Patient informed that his/her managed care company has contracts with or will negotiate with certain facilities, including the following:            Patient/family informed of bed offers received.  Patient chooses bed at       Physician recommends and patient chooses bed at      Patient to be transferred to   on  .  Patient to be transferred to facility by       Patient family notified on   of transfer.  Name of family member notified:        PHYSICIAN Please sign FL2, Please prepare priority discharge summary, including medications     Additional Comment:    Barbette Or,  [place]

## 2016-10-10 NOTE — Progress Notes (Signed)
Patient ID: Maria [name], female   DOB: August 02, 1924, 83 y.o.   MRN: [id number]  Patient with severe agitation requiring four point restraints.  Haldol 1mg  ordered without significant effect. Additional Haldol 1mg  ordered for patient and staff safety.  Continue to monitor.

## 2016-10-11 LAB — COMPREHENSIVE METABOLIC PANEL
ALBUMIN: 2.9 g/dL — AB (ref 3.5–5.0)
ALBUMIN: 2.9 g/dL — AB (ref 3.5–5.0)
ALK PHOS: 105 U/L (ref 38–126)
ALK PHOS: 98 U/L (ref 38–126)
ALT: 67 U/L — ABNORMAL HIGH (ref 14–54)
ALT: 61 U/L — ABNORMAL HIGH (ref 14–54)
ANION GAP: 12 (ref 5–15)
ANION GAP: 12 (ref 5–15)
AST: 45 U/L — AB (ref 15–41)
AST: 62 U/L — AB (ref 15–41)
BILIRUBIN TOTAL: 2.9 mg/dL — AB (ref 0.3–1.2)
BUN: 13 mg/dL (ref 6–20)
BUN: 14 mg/dL (ref 6–20)
CO2: 22 mmol/L (ref 22–32)
CO2: 19 mmol/L — AB (ref 22–32)
Calcium: 8 mg/dL — ABNORMAL LOW (ref 8.9–10.3)
Calcium: 7.9 mg/dL — ABNORMAL LOW (ref 8.9–10.3)
Chloride: 107 mmol/L (ref 101–111)
Chloride: 106 mmol/L (ref 101–111)
Creatinine, Ser: 0.83 mg/dL (ref 0.44–1.00)
Creatinine, Ser: 0.8 mg/dL (ref 0.44–1.00)
GFR calc Af Amer: >60 mL/min (ref >60)
GFR calc Af Amer: >60 mL/min (ref >60)
GFR calc non Af Amer: >60 mL/min (ref >60)
GFR calc non Af Amer: >60 mL/min (ref >60)
GLUCOSE: 130 mg/dL — AB (ref 65–99)
GLUCOSE: 146 mg/dL — AB (ref 65–99)
POTASSIUM: 3.7 mmol/L (ref 3.5–5.1)
POTASSIUM: 4.9 mmol/L (ref 3.5–5.1)
SODIUM: 141 mmol/L (ref 135–145)
SODIUM: 137 mmol/L (ref 135–145)
TOTAL PROTEIN: 6.8 g/dL (ref 6.5–8.1)
Total Bilirubin: 3.1 mg/dL — ABNORMAL HIGH (ref 0.3–1.2)
Total Protein: 6.4 g/dL — ABNORMAL LOW (ref 6.5–8.1)

## 2016-10-11 LAB — GLUCOSE, CAPILLARY
Glucose-Capillary: 139 mg/dL — ABNORMAL HIGH (ref 65–99)
Glucose-Capillary: 157 mg/dL — ABNORMAL HIGH (ref 65–99)
Glucose-Capillary: 152 mg/dL — ABNORMAL HIGH (ref 65–99)
Glucose-Capillary: 152 mg/dL — ABNORMAL HIGH (ref 65–99)
Glucose-Capillary: 189 mg/dL — ABNORMAL HIGH (ref 65–99)

## 2016-10-11 MED ORDER — LORAZEPAM 2 MG/ML IJ SOLN: 1.0000 mg | Freq: Four times a day (QID) | INTRAMUSCULAR | Status: DC | PRN

## 2016-10-11 MED ORDER — RISPERIDONE 0.5 MG PO TABS
0.5000 mg | ORAL_TABLET | Freq: Every day | ORAL | Status: DC
  Administered 2016-10-11: 0.5 mg via ORAL
  Filled 2016-10-11: qty 1

## 2016-10-11 MED ORDER — AMLODIPINE BESYLATE 5 MG PO TABS
5.0000 mg | ORAL_TABLET | Freq: Every day | ORAL | Status: DC
  Administered 2016-10-11 – 2016-10-12 (×2): 5 mg via ORAL
  Filled 2016-10-11: qty 2
  Filled 2016-10-11: qty 1

## 2016-10-11 MED ORDER — HALOPERIDOL LACTATE 5 MG/ML IJ SOLN: 2.5000 mg | Freq: Four times a day (QID) | INTRAMUSCULAR | Status: DC | PRN

## 2016-10-11 NOTE — Progress Notes (Signed)
Physical Therapy Treatment Patient Details Name: Maria [name] MRN: [id number] DOB: 05/06/1924 Today's Date: 10/11/2016    History of Present Illness Pt is a 83 y/o female admitted secondary to chest pain, found to have acute cholecystitis. Pt is now s/p percutaneous cholecystostomy on 10/08/16. PMH including but not limited to breast cancer in 2015, HTN, DM and cardiac pacemaker placement in 2003.    PT Comments    Pt making slow,steady progress with mobility.  Follow Up Recommendations  SNF;Supervision/Assistance - 24 hour     Equipment Recommendations  Other (comment) (To be assessed)    Recommendations for Other Services       Precautions / Restrictions Precautions Precautions: Fall Precaution Comments: R abdominal drain Restrictions Weight Bearing Restrictions: No    Mobility  Bed Mobility Overal bed mobility: Needs Assistance Bed Mobility: Supine to Sit     Supine to sit: Mod assist     General bed mobility comments: Assist to elevate trunk into sitting and bring hips to EOB  Transfers Overall transfer level: Needs assistance Equipment used: Rolling walker (2 wheeled) Transfers: Sit to/from Stand Sit to Stand: Mod assist         General transfer comment: assist to bring hips up and for balance. Verbal cues for hand placement  Ambulation/Gait Ambulation/Gait assistance: Min assist;+2 safety/equipment Ambulation Distance (Feet): 80 Feet Assistive device: Rolling walker (2 wheeled) Gait Pattern/deviations: Step-through pattern;Decreased step length - right;Decreased step length - left;Trunk flexed;Shuffle Gait velocity: decreased Gait velocity interpretation: <1.8 ft/sec, indicative of risk for recurrent falls General Gait Details: Assist for balance. Pt initial attempt at ambulation interupted by urgent need for bathroom.    Stairs            Wheelchair Mobility    Modified Rankin (Stroke Patients Only)       Balance Overall balance  assessment: Needs assistance Sitting-balance support: Feet supported Sitting balance-Leahy Scale: Fair     Standing balance support: During functional activity;Bilateral upper extremity supported Standing balance-Leahy Scale: Poor Standing balance comment: walker and min A for static standing                    Cognition Arousal/Alertness: Awake/alert Behavior During Therapy: WFL for tasks assessed/performed Overall Cognitive Status: Impaired/Different from baseline Area of Impairment: Following commands;Safety/judgement;Problem solving;Memory       Following Commands: Follows one step commands consistently Safety/Judgement: Decreased awareness of safety   Problem Solving: Slow processing;Requires verbal cues;Requires tactile cues      Exercises      General Comments        Pertinent Vitals/Pain Pain Assessment: Faces Faces Pain Scale: Hurts a little bit Pain Location: abdomen Pain Descriptors / Indicators: Grimacing;Guarding Pain Intervention(s): Limited activity within patient's tolerance;Monitored during session    Home Living                      Prior Function            PT Goals (current goals can now be found in the care plan section) Progress towards PT goals: Progressing toward goals    Frequency    Min 3X/week      PT Plan Current plan remains appropriate    Co-evaluation             End of Session Equipment Utilized During Treatment: Gait belt Activity Tolerance: Patient limited by fatigue Patient left: with call bell/phone within reach;in chair;with chair alarm set     Time: [phone]  PT Time Calculation (min) (ACUTE ONLY): 28 min  Charges:  $Gait Training: 23-37 mins                    G Codes:      Shary Decamp [place] 11/08/16, 5:00 PM [place]

## 2016-10-11 NOTE — Progress Notes (Signed)
PROGRESS NOTE        PATIENT DETAILS Name: Maria [name] Age: 83 y.o. Sex: female Date of Birth: September 04, 1924 Admit Date: 10/06/2016 Admitting Physician Etta Quill, DO [id number] Alroy Dust, MD  Brief Narrative: Patient is a 83 y.o. female with history of complete heart block-status post permanent pacemaker implantation, hypertension, type 2 diabetes admitted with mostly right upper quadrant pain, vomiting-thought to have acute acalculous cholecystitis. Patient was subsequently admitted for further evaluation and treatment  Subjective: Very confused last night-slightly confused this morning-is easily redirectable.  Assessment/Plan: Acute cholecystitis:Improved, underwent percutaneous drain placement on 1/26. Seen by cards and thought to be a high risk operative candidate. Bile cultures positive for pansensitive Klebsiella, continue empiric antimicrobial therapy-suspect we could narrow to Augmentin in the next day or so. LFTs including bilirubin are downtrending, no choledocholithiasis or CBD dilatation seen on CT scan of the abdomen. Spoke with radiology PA-Kelly yesterday-unable to do cholangiogram through the existing gallbladder drain as cystic duct is not patent on HIDA scan. I highly doubt that this patient has choledocholithiasis, plans are to continue to monitor closely-please note unable to pursue MRCP due to pacemaker in place.   Systemic inflammatory response syndrome: SIRS pathophysiology has resolved. Likely secondary to above, hemodynamics remain stable-doubt sepsis syndrome. Continue empiric antimicrobial therapy. Blood cultures negative so far, biliary cx positive for pansensitive Klebsiella   Mild delirium: Supportive care-suspect secondary to narcotics, probable mild cognitive dysfunction at baseline. She required 4. restraints last night-I have discontinued all restraints,  start low-dose risperidone daily at bedtime. Follow for now.  Minimally  elevated troponins: Trend is mostly flat-I suspect this is demand ischemia rather than ACS. She does not have chest pain-pain is mostly in the right upper quadrant abdominal area. EKG shows a paced rhythm. Echo shows preserved EF with severe hypokinesis of the apex and anetero-sepal myocardium. Suspect further work up can be pursued in the outpatient setting-but with advance age-best managed medically. Since diet stable-will start ASA.  Asymptomatic bacteriuria: UA consistent with UTI-however patient symptoms are mostly consistent with cholecystitis. On antimicrobial therapy in any event that should cover  Type 2 diabetes: CBGs relatively stable with SSI-continue to hold oral hypoglycemic agents  Hypertension: Blood pressure now starting to creep up,  start low-dose amlodipine. Follow   History of complete heart block-has permanent pacemaker in place  Severe Pul HTN: seen on Echo-further work-in the outpatient setting-once recovered from acute illness.   Generalized weakness/deconditioning: Secondary to acute illness-underlying frailty/advanced age-PT evaluation completed-plans are for SNF on discharge. Have asked RN to make social work aware of discharge plans to SNF.  DVT Prophylaxis: Prophylactic Lovenox  Code Status: Full code  Family Communication: None at bedside  Disposition Plan: Remain inpatient-SNF in the next 1-2 days if off all restraints.  Antimicrobial agents: Anti-infectives    Start     Dose/Rate Route Frequency Ordered Stop   10/07/16 1000  piperacillin-tazobactam (ZOSYN) IVPB 3.375 g     3.375 g 12.5 mL/hr over 240 Minutes Intravenous Every 8 hours 10/07/16 0143     10/06/16 2345  piperacillin-tazobactam (ZOSYN) IVPB 3.375 g     3.375 g 100 mL/hr over 30 Minutes Intravenous  Once 10/06/16 2341 10/07/16 0200   10/06/16 2345  vancomycin (VANCOCIN) IVPB 1000 mg/200 mL premix  Status:  Discontinued     1,000 mg 200 mL/hr over 60 Minutes  Intravenous  Once 10/06/16  2341 10/07/16 0046      Procedures: Echo 1/25>>Study Conclusions - Left ventricle: The cavity size was normal. Wall thickness was   increased in a pattern of mild LVH. Systolic function was normal.   The estimated ejection fraction was in the range of 50% to 55%.   Severe hypokinesis of the apical myocardium. Hypokinesis of the   anteroseptal myocardium. Doppler parameters are consistent with   abnormal left ventricular relaxation (grade 1 diastolic   dysfunction). - Ventricular septum: The contour showed systolic flattening. These   changes are consistent with RV pressure overload. - Mitral valve: There was mild regurgitation. - Right ventricle: The cavity size was mildly dilated. Wall   thickness was normal. - Right atrium: The atrium was mildly dilated. - Tricuspid valve: There was moderate-severe regurgitation. - Pulmonary arteries: Systolic pressure was severely increased. PA   peak pressure: 81 mm Hg (S).  CONSULTS:  cardiology and general surgery  Time spent: 25 minutes-Greater than 50% of this time was spent in counseling, explanation of diagnosis, planning of further management, and coordination of care.  MEDICATIONS: Scheduled Meds: . aspirin EC  81 mg Oral Daily  . enoxaparin (LOVENOX) injection  40 mg Subcutaneous Daily  . insulin aspart  0-9 Units Subcutaneous Q4H  . mouth rinse  15 mL Mouth Rinse BID  . piperacillin-tazobactam (ZOSYN)  IV  3.375 g Intravenous Q8H  . potassium chloride  40 mEq Oral BID  . risperiDONE  0.5 mg Oral QHS  . sodium chloride flush  3 mL Intravenous Q12H   Continuous Infusions:  PRN Meds:.acetaminophen, albuterol, haloperidol lactate, LORazepam, metoprolol, oxyCODONE-acetaminophen   PHYSICAL EXAM: Vital signs: Vitals:   10/11/16 0400 10/11/16 0429 10/11/16 0500 10/11/16 0700  BP: 104/66 112/63 121/67 (!) 153/85  Pulse: 86 83  (!) 102  Resp: (!) 29 (!) 30 (!) 29 (!) 26  Temp:  97.6 F (36.4 C)    TempSrc:  Axillary      SpO2:  96%    Weight:  70.4 kg (155 lb 3.3 oz)    Height:       Filed Weights   10/07/16 0606 10/10/16 0319 10/11/16 0429  Weight: 72.3 kg (159 lb 6.3 oz) 75.6 kg (166 lb 10.7 oz) 70.4 kg (155 lb 3.3 oz)   Body mass index is 28.39 kg/m.   General appearance :Awake, alert-slightly lethargic. Speech Clear. Not toxic Looking Eyes:, pupils equally reactive to light and accomodation,no scleral icterus. HEENT: Atraumatic and Normocephalic Neck: supple, no JVD. No cervical lymphadenopathy. No thyromegaly Resp:Good air entry bilaterally, no added sounds  CVS: S1 S2 regular, no murmurs.  GI: Bowel sounds present, minimally tender RUQ area Extremities: B/L Lower Ext shows no edema, both legs are warm to touch Neurology:  speech clear,Non focal, sensation is grossly intact. Psychiatric: Normal judgment and insight. Alert and oriented x 3. Normal mood. Musculoskeletal:No digital cyanosis Skin:No Rash, warm and dry Wounds:N/A  I have personally reviewed following labs and imaging studies  LABORATORY DATA: CBC:  Recent Labs Lab 10/06/16 1828 10/06/16 2241 10/07/16 0130 10/08/16 0646 10/09/16 0257  WBC 10.4  --  14.4* 14.0* 8.1  HGB 15.1* 16.3* 13.9 11.9* 11.6*  HCT 43.5 48.0* 40.9 35.9* 34.7*  MCV 83.5  --  84.0 83.9 84.8  PLT 244  --  252 174 147*    Basic Metabolic Panel:  Recent Labs Lab 10/07/16 0604 10/08/16 0646 10/09/16 0257 10/10/16 0344 10/11/16 0236  NA 135 135 137 134*  141  K 3.6 3.1* 3.7 3.1* 3.7  CL 101 102 104 102 107  CO2 20* 21* 21* 21* 22  GLUCOSE 200* 216* 121* 129* 130*  BUN 10 11 15 16 13   CREATININE 0.70 0.93 0.83 0.96 0.83  CALCIUM 8.4* 7.8* 7.5* 7.6* 8.0*    GFR: Estimated Creatinine Clearance: 41.4 mL/min (by C-G formula based on SCr of 0.83 mg/dL).  Liver Function Tests:  Recent Labs Lab 10/07/16 0130 10/08/16 0646 10/09/16 0257 10/10/16 0344 10/11/16 0236  AST 27 157* 68* 65* 45*  ALT 18 134* 91* 82* 67*  ALKPHOS 35* 84 83 115  105  BILITOT 1.2 4.5* 5.7* 4.0* 2.9*  PROT 7.6 6.3* 6.5 6.8 6.8  ALBUMIN 3.9 2.9* 2.6* 2.9* 2.9*    Recent Labs Lab 10/07/16 0130 10/08/16 1002  LIPASE <10* 14    Recent Labs Lab 10/08/16 1002  AMMONIA 47*    Coagulation Profile:  Recent Labs Lab 10/08/16 0859  INR 1.52    Cardiac Enzymes:  Recent Labs Lab 10/07/16 0405 10/07/16 0604 10/07/16 1258  TROPONINI 0.27* 0.29* 0.34*    BNP (last 3 results) No results for input(s): PROBNP in the last 8760 hours.  HbA1C: No results for input(s): HGBA1C in the last 72 hours.  CBG:  Recent Labs Lab 10/10/16 1845 10/10/16 2009 10/10/16 2337 10/11/16 0428 10/11/16 0816  GLUCAP 274* 243* 140* 139* 157*    Lipid Profile: No results for input(s): CHOL, HDL, LDLCALC, TRIG, CHOLHDL, LDLDIRECT in the last 72 hours.  Thyroid Function Tests: No results for input(s): TSH, T4TOTAL, FREET4, T3FREE, THYROIDAB in the last 72 hours.  Anemia Panel: No results for input(s): VITAMINB12, FOLATE, FERRITIN, TIBC, IRON, RETICCTPCT in the last 72 hours.  Urine analysis:    Component Value Date/Time   COLORURINE YELLOW 10/07/2016 0034   APPEARANCEUR HAZY (A) 10/07/2016 0034   LABSPEC 1.032 (H) 10/07/2016 0034   PHURINE 6.0 10/07/2016 0034   GLUCOSEU >=500 (A) 10/07/2016 0034   HGBUR SMALL (A) 10/07/2016 0034   BILIRUBINUR NEGATIVE 10/07/2016 0034   KETONESUR 20 (A) 10/07/2016 0034   PROTEINUR 100 (A) 10/07/2016 0034   NITRITE NEGATIVE 10/07/2016 0034   LEUKOCYTESUR TRACE (A) 10/07/2016 0034    Sepsis Labs: Lactic Acid, Venous    Component Value Date/Time   LATICACIDVEN 3.85 (HH) 10/06/2016 2335    MICROBIOLOGY: Recent Results (from the past 240 hour(s))  Blood culture (routine x 2)     Status: None (Preliminary result)   Collection Time: 10/07/16 12:20 AM  Result Value Ref Range Status   Specimen Description BLOOD RIGHT ANTECUBITAL  Final   Special Requests IN PEDIATRIC BOTTLE 2CC  Final   Culture NO GROWTH 3  DAYS  Final   Report Status PENDING  Incomplete  Urine culture     Status: Abnormal   Collection Time: 10/07/16 12:34 AM  Result Value Ref Range Status   Specimen Description URINE, RANDOM  Final   Special Requests NONE  Final   Culture >=100,000 COLONIES/mL ESCHERICHIA COLI (A)  Final   Report Status 10/09/2016 FINAL  Final   Organism ID, Bacteria ESCHERICHIA COLI (A)  Final      Susceptibility   Escherichia coli - MIC*    AMPICILLIN <=2 SENSITIVE Sensitive     CEFAZOLIN <=4 SENSITIVE Sensitive     CEFTRIAXONE <=1 SENSITIVE Sensitive     CIPROFLOXACIN <=0.25 SENSITIVE Sensitive     GENTAMICIN <=1 SENSITIVE Sensitive     IMIPENEM <=0.25 SENSITIVE Sensitive  NITROFURANTOIN <=16 SENSITIVE Sensitive     TRIMETH/SULFA <=20 SENSITIVE Sensitive     AMPICILLIN/SULBACTAM <=2 SENSITIVE Sensitive     PIP/TAZO <=4 SENSITIVE Sensitive     Extended ESBL NEGATIVE Sensitive     * >=100,000 COLONIES/mL ESCHERICHIA COLI  Blood culture (routine x 2)     Status: None (Preliminary result)   Collection Time: 10/07/16  1:30 AM  Result Value Ref Range Status   Specimen Description BLOOD LEFT ANTECUBITAL  Final   Special Requests BOTTLES DRAWN AEROBIC AND ANAEROBIC 10CC  Final   Culture NO GROWTH 3 DAYS  Final   Report Status PENDING  Incomplete  MRSA PCR Screening     Status: None   Collection Time: 10/07/16  6:03 AM  Result Value Ref Range Status   MRSA by PCR NEGATIVE NEGATIVE Final    Comment:        The GeneXpert MRSA Assay (FDA approved for NASAL specimens only), is one component of a comprehensive MRSA colonization surveillance program. It is not intended to diagnose MRSA infection nor to guide or monitor treatment for MRSA infections.   Aerobic/Anaerobic Culture (surgical/deep wound)     Status: None (Preliminary result)   Collection Time: 10/08/16 11:41 AM  Result Value Ref Range Status   Specimen Description GALL BLADDER  Final   Special Requests BILE  Final   Gram Stain    Final    ABUNDANT WBC PRESENT, PREDOMINANTLY PMN ABUNDANT GRAM NEGATIVE RODS    Culture   Final    MODERATE KLEBSIELLA PNEUMONIAE NO ANAEROBES ISOLATED; CULTURE IN PROGRESS FOR 5 DAYS    Report Status PENDING  Incomplete   Organism ID, Bacteria KLEBSIELLA PNEUMONIAE  Final      Susceptibility   Klebsiella pneumoniae - MIC*    AMPICILLIN 16 RESISTANT Resistant     CEFAZOLIN <=4 SENSITIVE Sensitive     CEFEPIME <=1 SENSITIVE Sensitive     CEFTAZIDIME <=1 SENSITIVE Sensitive     CEFTRIAXONE <=1 SENSITIVE Sensitive     CIPROFLOXACIN <=0.25 SENSITIVE Sensitive     GENTAMICIN <=1 SENSITIVE Sensitive     IMIPENEM <=0.25 SENSITIVE Sensitive     TRIMETH/SULFA <=20 SENSITIVE Sensitive     AMPICILLIN/SULBACTAM <=2 SENSITIVE Sensitive     PIP/TAZO <=4 SENSITIVE Sensitive     Extended ESBL NEGATIVE Sensitive     * MODERATE KLEBSIELLA PNEUMONIAE    RADIOLOGY STUDIES/RESULTS: Dg Chest 2 View  Result Date: 10/06/2016 CLINICAL DATA:  Mid chest pain for 2 days with shortness of breath EXAM: CHEST  2 VIEW COMPARISON:  04/04/2014 FINDINGS: Low volume film with stable asymmetric elevation right hemidiaphragm. The lungs are clear wiithout focal pneumonia, edema, pneumothorax or pleural effusion. Cardiopericardial silhouette is at upper limits of normal for size. Left-sided dual lead permanent pacemaker again noted. Degenerative changes are evident in the shoulders bilaterally. IMPRESSION: Stable.  No acute findings. Electronically Signed   By: Misty Stanley M.D.   On: 10/06/2016 18:53   Nm Hepatobiliary Including Gb  Result Date: 10/07/2016 CLINICAL DATA:  Acute acalculous cholecystitis EXAM: NUCLEAR MEDICINE HEPATOBILIARY IMAGING TECHNIQUE: Sequential images of the abdomen were obtained out to 60 minutes following intravenous administration of radiopharmaceutical. RADIOPHARMACEUTICALS:  5.2 mCi Tc-59m  Choletec IV COMPARISON:  CT abdomen pelvis 10/06/2016 FINDINGS: There is good uptake by the liver.  Small bowel activity is noted beginning at 10 minutes. No gallbladder activity was seen at 1 hour The patient received 2.0 mg of IV morphine in attempt to visualize the gallbladder. Imaging  was obtained for another 33 minutes at which time the patient refused further imaging. No gallbladder activity was demonstrated post morphine administration. IMPRESSION: Negative for gallbladder activity including after morphine administration. Findings consistent with cystic duct obstruction. Electronically Signed   By: Franchot Gallo M.D.   On: 10/07/2016 11:25   Ct Abdomen Pelvis W Contrast  Result Date: 10/07/2016 CLINICAL DATA:  Chest pain with nausea and vomiting. EXAM: CT ABDOMEN AND PELVIS WITH CONTRAST TECHNIQUE: Multidetector CT imaging of the abdomen and pelvis was performed using the standard protocol following bolus administration of intravenous contrast. CONTRAST:  177mL ISOVUE-300 IOPAMIDOL (ISOVUE-300) INJECTION 61% COMPARISON:  None. FINDINGS: Lower chest: Borderline cardiomegaly with right atrial and right ventricular pacing leads noted. No pericardial effusion. Bibasilar atelectasis. No pneumonic consolidation, effusion or pneumothorax. Hepatobiliary: Mild fatty infiltration of the liver without space-occupying mass. No biliary dilatation. Distended gallbladder with mild wall thickening up to 4 mm and pericholecystic fluid concerning for acalculus cholecystitis. No gallstones or choledocholithiasis. Pancreas: Unremarkable Spleen: No splenomegaly.  Small splenule at the hilum. Adrenals/Urinary Tract: Normal bilateral adrenal glands. Tiny too small to further characterize 5 mm left upper abdominal and interpolar and lower pole hypodensity statistically consistent with cysts. No hydroureteronephrosis. Urinary bladder is physiologically distended. Stomach/Bowel: Nondistended stomach. Normal small bowel rotation. No acute bowel obstruction or inflammation. Vascular/Lymphatic: Moderate aortoiliac atherosclerosis.  No aneurysm. No lymphadenopathy. Reproductive: Fibroid uterus with a noncalcified intramural 12 mm in diameter fibroid with partially calcified 16 mm left-sided subserosal fibroid. A few calcifications are noted intramural in etiology on the left which may also reflect tiny calcified fibroids. Other: No abdominal wall hernia or abnormality. No abdominopelvic ascites. Musculoskeletal: Lumbar degenerative disc disease L2 through L5 with small posterior marginal osteophytes. Degenerative joint space narrowing of both hips with spurring off the femoral head- neck junctures. No acute osseous abnormality. IMPRESSION: 1. Distended slightly thick-walled gallbladder with pericholecystic fluid concerning for acalculus cholecystitis. 2. Colonic diverticulosis without acute diverticulitis. 3. Fibroid uterus. 4. Subcentimeter left renal cysts too small for further characterize. Electronically Signed   By: Ashley Royalty M.D.   On: 10/07/2016 00:23   Ir Perc Cholecystostomy  Result Date: 10/08/2016 INDICATION: ACUTE CHOLECYSTITIS, NON OPERATIVE CANDIDATE EXAM: CHOLECYSTOSTOMY MEDICATIONS: 3.375 g Zosyn; The antibiotic was administered within an appropriate time frame prior to the initiation of the procedure. ANESTHESIA/SEDATION: Moderate (conscious) sedation was employed during this procedure. A total of Versed 2.0 mg and Fentanyl 100 mcg was administered intravenously. Moderate Sedation Time: 10 minutes. The patient's level of consciousness and vital signs were monitored continuously by radiology nursing throughout the procedure under my direct supervision. FLUOROSCOPY TIME:  Fluoroscopy Time:  30 seconds (3 mGy). COMPLICATIONS: None immediate. PROCEDURE: Informed written consent was obtained from the patient after a thorough discussion of the procedural risks, benefits and alternatives. All questions were addressed. Maximal Sterile Barrier Technique was utilized including caps, mask, sterile gowns, sterile gloves, sterile  drape, hand hygiene and skin antiseptic. A timeout was performed prior to the initiation of the procedure. Previous imaging reviewed. Preliminary ultrasound performed. Gallbladder demonstrated a right upper quadrant. Percutaneous transhepatic window was localized. Overlying skin marked. Under sterile conditions and local anesthesia, ultrasound percutaneous transhepatic access performed of the distended gallbladder. Needle position confirmed with ultrasound. Images obtained for documentation. There was return of bile. Sample sent for culture. Guidewire inserted followed by Accustick dilator set. Amplatz guidewire exchange performed. Tract dilatation performed to insert a 10 [place] drain. Retention loop formed in the gallbladder. Position confirmed with fluoroscopy. Additional  30 cc purulent bile aspirated. This decompressed the gallbladder by ultrasound. Catheter secured with a Prolene suture and connected to external gravity drainage bag. Sterile dressing applied. No immediate complication. Patient tolerated the procedure well. IMPRESSION: Successful ultrasound and fluoroscopic percutaneous transhepatic cholecystostomy. Electronically Signed   By: Jerilynn Mages.  Shick M.D.   On: 10/08/2016 11:36   Dg Chest Port 1 View  Result Date: 10/08/2016 CLINICAL DATA:  83 year old female with increasing shortness of breath. EXAM: PORTABLE CHEST 1 VIEW COMPARISON:  Chest radiograph dated 10/06/2016 FINDINGS: There is low lung volumes with stable eventration of the right hemidiaphragm. Left lung base density appears similar to prior radiograph and likely represents atelectatic changes. Infiltrate is not excluded. Clinical correlation is recommended. There is no pleural effusion or pneumothorax. Stable cardiac silhouette. Left pectoral dual lead pacemaker wires. There is degenerative changes of the spine and shoulders. No acute fracture. IMPRESSION: No significant interval change. Left lung base density likely atelectatic changes.  Developing infiltrate is not excluded. Clinical correlation is recommended. Electronically Signed   By: Anner Crete M.D.   On: 10/08/2016 01:11     LOS: 4 days   Oren Binet, MD  [facility] Pager:[phone]  If 7PM-7AM, please contact night-coverage [email or web address] Password [facility] 10/11/2016, 8:17 AM

## 2016-10-11 NOTE — Progress Notes (Signed)
Report called to [facility] RN on 5W. Patient will be going to room 25. Patient to be transported after she finishes breakfast.

## 2016-10-11 NOTE — Progress Notes (Signed)
Pt arrived to unit 5 [place], room [id number] via wheelchair with RN and her daughter. Pt and family oriented to room, call bell, tele-sitter, bed controls. Tele-sitter explained to daughter and pt for pt safety, verbalized understanding and appreciative of any safety measures for pt safety.  Also informed of bed alarm. Pt resting comfortably at this time, wanting to sleep/take a nap she states.

## 2016-10-11 NOTE — Progress Notes (Signed)
Referring Physician(s): Dr Nena Alexander  Supervising Physician: Arne Cleveland  Patient Status:  [facility] - In-pt  Chief Complaint:  Chole drain placed 1/26   Subjective:  Resting No complaints No pain Denies fever; chills Advancing diet  Allergies: Patient has no known allergies.  Medications: Prior to Admission medications   Medication Sig Start Date End Date Taking? Authorizing Provider  acetaminophen (TYLENOL) 500 MG tablet Take 500 mg by mouth every 6 (six) hours as needed for mild pain.   Yes Historical Provider, MD  aspirin EC 81 MG tablet Take 81 mg by mouth daily.   Yes Historical Provider, MD  Calcium-Magnesium-Vitamin D [id number] MG-MG-UNIT TABS Take 1 tablet by mouth daily.   Yes Historical Provider, MD  diclofenac (VOLTAREN) 75 MG EC tablet Take 1 tablet by mouth 2 (two) times daily as needed. 07/18/14  Yes Historical Provider, MD  losartan-hydrochlorothiazide (HYZAAR) 50-12.5 MG per tablet Take 1 tablet by mouth daily. 09/11/14  Yes Historical Provider, MD  meloxicam (MOBIC) 15 MG tablet Take 15 mg by mouth daily. 05/27/15  Yes Historical Provider, MD  metFORMIN (GLUCOPHAGE) 500 MG tablet Take 1 tablet by mouth 2 (two) times daily. 09/09/14  Yes Historical Provider, MD  Multiple Vitamin (MULTIVITAMIN WITH MINERALS) TABS tablet Take 1 tablet by mouth daily.   Yes Historical Provider, MD  nabumetone (RELAFEN) 500 MG tablet Take 500 mg by mouth 2 (two) times daily as needed for moderate pain. Reported on 11/20/2015 10/27/15  Yes Historical Provider, MD  [place] 2 drops into both eyes daily as needed (for dry eyes).   Yes Historical Provider, MD  Polyvinyl Alcohol-Povidone (REFRESH OP) Place 1 drop into both eyes 2 (two) times daily as needed (for dry eyes).   Yes Historical Provider, MD  potassium chloride SA (K-DUR,KLOR-CON) 20 MEQ tablet Take 20 mEq by mouth daily.   Yes Historical Provider, MD  valACYclovir (VALTREX) 1000 MG tablet Take 1 g by  mouth every 12 (twelve) hours as needed. For outbreaks 10/27/15  Yes Historical Provider, MD  Blood Glucose Monitoring Suppl (FREESTYLE LITE) DEVI  08/28/14   Historical Provider, MD  FREESTYLE LITE test strip  08/28/14   Historical Provider, MD  Lancets (FREESTYLE) lancets  08/28/14   Historical Provider, MD     Vital Signs: BP (!) 163/76 (BP Location: Left Arm)   Pulse 86   Temp 97.5 F (36.4 C) (Oral)   Resp 18   Ht 5\' 2"  (1.575 m)   Wt 155 lb 3.3 oz (70.4 kg)   SpO2 99%   BMI 28.39 kg/m   Physical Exam  Constitutional: She is oriented to person, place, and time.  Abdominal: Soft. Bowel sounds are normal.  Musculoskeletal: Normal range of motion.  Neurological: She is alert and oriented to person, place, and time.  Skin: Skin is warm and dry.  Site of percutaneous chole drain is clean and dry NT no bleeding OP 50 cc yesterday 10 cc in bag now: bile appearing  Wbc wnl TB 2.9 (4) LFTs down afeb   Psychiatric: She has a normal mood and affect. Her behavior is normal.  Nursing note and vitals reviewed.   Imaging: Nm Hepatobiliary Including Gb  Result Date: 10/07/2016 CLINICAL DATA:  Acute acalculous cholecystitis EXAM: NUCLEAR MEDICINE HEPATOBILIARY IMAGING TECHNIQUE: Sequential images of the abdomen were obtained out to 60 minutes following intravenous administration of radiopharmaceutical. RADIOPHARMACEUTICALS:  5.2 mCi Tc-52m  Choletec IV COMPARISON:  CT abdomen pelvis 10/06/2016 FINDINGS: There is  good uptake by the liver. Small bowel activity is noted beginning at 10 minutes. No gallbladder activity was seen at 1 hour The patient received 2.0 mg of IV morphine in attempt to visualize the gallbladder. Imaging was obtained for another 33 minutes at which time the patient refused further imaging. No gallbladder activity was demonstrated post morphine administration. IMPRESSION: Negative for gallbladder activity including after morphine administration. Findings consistent  with cystic duct obstruction. Electronically Signed   By: Franchot Gallo M.D.   On: 10/07/2016 11:25   Ir Perc Cholecystostomy  Result Date: 10/08/2016 INDICATION: ACUTE CHOLECYSTITIS, NON OPERATIVE CANDIDATE EXAM: CHOLECYSTOSTOMY MEDICATIONS: 3.375 g Zosyn; The antibiotic was administered within an appropriate time frame prior to the initiation of the procedure. ANESTHESIA/SEDATION: Moderate (conscious) sedation was employed during this procedure. A total of Versed 2.0 mg and Fentanyl 100 mcg was administered intravenously. Moderate Sedation Time: 10 minutes. The patient's level of consciousness and vital signs were monitored continuously by radiology nursing throughout the procedure under my direct supervision. FLUOROSCOPY TIME:  Fluoroscopy Time:  30 seconds (3 mGy). COMPLICATIONS: None immediate. PROCEDURE: Informed written consent was obtained from the patient after a thorough discussion of the procedural risks, benefits and alternatives. All questions were addressed. Maximal Sterile Barrier Technique was utilized including caps, mask, sterile gowns, sterile gloves, sterile drape, hand hygiene and skin antiseptic. A timeout was performed prior to the initiation of the procedure. Previous imaging reviewed. Preliminary ultrasound performed. Gallbladder demonstrated a right upper quadrant. Percutaneous transhepatic window was localized. Overlying skin marked. Under sterile conditions and local anesthesia, ultrasound percutaneous transhepatic access performed of the distended gallbladder. Needle position confirmed with ultrasound. Images obtained for documentation. There was return of bile. Sample sent for culture. Guidewire inserted followed by Accustick dilator set. Amplatz guidewire exchange performed. Tract dilatation performed to insert a 10 [place] drain. Retention loop formed in the gallbladder. Position confirmed with fluoroscopy. Additional 30 cc purulent bile aspirated. This decompressed the  gallbladder by ultrasound. Catheter secured with a Prolene suture and connected to external gravity drainage bag. Sterile dressing applied. No immediate complication. Patient tolerated the procedure well. IMPRESSION: Successful ultrasound and fluoroscopic percutaneous transhepatic cholecystostomy. Electronically Signed   By: Jerilynn Mages.  Shick M.D.   On: 10/08/2016 11:36   Dg Chest Port 1 View  Result Date: 10/08/2016 CLINICAL DATA:  83 year old female with increasing shortness of breath. EXAM: PORTABLE CHEST 1 VIEW COMPARISON:  Chest radiograph dated 10/06/2016 FINDINGS: There is low lung volumes with stable eventration of the right hemidiaphragm. Left lung base density appears similar to prior radiograph and likely represents atelectatic changes. Infiltrate is not excluded. Clinical correlation is recommended. There is no pleural effusion or pneumothorax. Stable cardiac silhouette. Left pectoral dual lead pacemaker wires. There is degenerative changes of the spine and shoulders. No acute fracture. IMPRESSION: No significant interval change. Left lung base density likely atelectatic changes. Developing infiltrate is not excluded. Clinical correlation is recommended. Electronically Signed   By: Anner Crete M.D.   On: 10/08/2016 01:11    Labs:  CBC:  Recent Labs  10/06/16 1828 10/06/16 2241 10/07/16 0130 10/08/16 0646 10/09/16 0257  WBC 10.4  --  14.4* 14.0* 8.1  HGB 15.1* 16.3* 13.9 11.9* 11.6*  HCT 43.5 48.0* 40.9 35.9* 34.7*  PLT 244  --  252 174 147*    COAGS:  Recent Labs  10/08/16 0859  INR 1.52    BMP:  Recent Labs  10/08/16 0646 10/09/16 0257 10/10/16 0344 10/11/16 0236  NA 135 137 134*  141  K 3.1* 3.7 3.1* 3.7  CL 102 104 102 107  CO2 21* 21* 21* 22  GLUCOSE 216* 121* 129* 130*  BUN 11 15 16 13   CALCIUM 7.8* 7.5* 7.6* 8.0*  CREATININE 0.93 0.83 0.96 0.83  GFRNONAA 53* >60 51* >60  GFRAA >60 >60 59* >60    LIVER FUNCTION TESTS:  Recent Labs  10/08/16 0646  10/09/16 0257 10/10/16 0344 10/11/16 0236  BILITOT 4.5* 5.7* 4.0* 2.9*  AST 157* 68* 65* 45*  ALT 134* 91* 82* 67*  ALKPHOS 84 83 115 105  PROT 6.3* 6.5 6.8 6.8  ALBUMIN 2.9* 2.6* 2.9* 2.9*    Assessment and Plan:  Acute cholecystitis Percutaneous cholecystostomy drain placed in IR 1/26 Will need follow up in IR OP drain clinic 5-6 weeks Order placed  Pt will hear from drain clinic for time and date  Electronically Signed: Dorthea Maina A 10/11/2016, 10:27 AM   I spent a total of 15 Minutes at the the patient's bedside AND on the patient's hospital floor or unit, greater than 50% of which was counseling/coordinating care for perc chole drain

## 2016-10-11 NOTE — Progress Notes (Signed)
Pt was very combative with nursing staff, pulled her IV, hitting and kicking nursing staff. MD Hugelmeyer was paged and pt was put on 4 point  Restraints. Daughter was called and updated about situation.

## 2016-10-11 NOTE — Care Management Important Message (Signed)
Important Message  Patient Details  Name: Maria [name] MRN: [id number] Date of Birth: 20-Jun-1924   Medicare Important Message Given:  Yes    Nathen May 10/11/2016, 2:11 PM

## 2016-10-11 NOTE — Progress Notes (Signed)
1st attempt top call report 

## 2016-10-12 ENCOUNTER — Other Ambulatory Visit: Payer: Self-pay | Admitting: General Surgery

## 2016-10-12 DIAGNOSIS — I272 Pulmonary hypertension, unspecified: Secondary | ICD-10-CM | POA: Diagnosis not present

## 2016-10-12 DIAGNOSIS — M6281 Muscle weakness (generalized): Secondary | ICD-10-CM | POA: Diagnosis not present

## 2016-10-12 DIAGNOSIS — E119 Type 2 diabetes mellitus without complications: Secondary | ICD-10-CM | POA: Diagnosis not present

## 2016-10-12 DIAGNOSIS — R000 Tachycardia, unspecified: Secondary | ICD-10-CM | POA: Diagnosis not present

## 2016-10-12 DIAGNOSIS — R6511 Systemic inflammatory response syndrome (SIRS) of non-infectious origin with acute organ dysfunction: Secondary | ICD-10-CM | POA: Diagnosis not present

## 2016-10-12 DIAGNOSIS — R0789 Other chest pain: Secondary | ICD-10-CM | POA: Diagnosis not present

## 2016-10-12 DIAGNOSIS — I2510 Atherosclerotic heart disease of native coronary artery without angina pectoris: Secondary | ICD-10-CM | POA: Diagnosis not present

## 2016-10-12 DIAGNOSIS — K810 Acute cholecystitis: Secondary | ICD-10-CM | POA: Diagnosis not present

## 2016-10-12 DIAGNOSIS — I209 Angina pectoris, unspecified: Secondary | ICD-10-CM | POA: Diagnosis not present

## 2016-10-12 DIAGNOSIS — I10 Essential (primary) hypertension: Secondary | ICD-10-CM | POA: Diagnosis not present

## 2016-10-12 DIAGNOSIS — K819 Cholecystitis, unspecified: Secondary | ICD-10-CM

## 2016-10-12 LAB — COMPREHENSIVE METABOLIC PANEL
ALT: 51 U/L (ref 14–54)
ANION GAP: 8 (ref 5–15)
AST: 35 U/L (ref 15–41)
Albumin: 2.8 g/dL — ABNORMAL LOW (ref 3.5–5.0)
Alkaline Phosphatase: 92 U/L (ref 38–126)
BILIRUBIN TOTAL: 2.3 mg/dL — AB (ref 0.3–1.2)
BUN: 8 mg/dL (ref 6–20)
CO2: 22 mmol/L (ref 22–32)
Calcium: 8.2 mg/dL — ABNORMAL LOW (ref 8.9–10.3)
Chloride: 110 mmol/L (ref 101–111)
Creatinine, Ser: 0.79 mg/dL (ref 0.44–1.00)
GFR calc Af Amer: >60 mL/min (ref >60)
Glucose, Bld: 135 mg/dL — ABNORMAL HIGH (ref 65–99)
POTASSIUM: 4.2 mmol/L (ref 3.5–5.1)
Sodium: 140 mmol/L (ref 135–145)
Total Protein: 6.5 g/dL (ref 6.5–8.1)

## 2016-10-12 LAB — CULTURE, BLOOD (ROUTINE X 2)
CULTURE: NO GROWTH
CULTURE: NO GROWTH

## 2016-10-12 LAB — GLUCOSE, CAPILLARY
GLUCOSE-CAPILLARY: 120 mg/dL — AB (ref 65–99)
GLUCOSE-CAPILLARY: 130 mg/dL — AB (ref 65–99)
GLUCOSE-CAPILLARY: 176 mg/dL — AB (ref 65–99)
Glucose-Capillary: 147 mg/dL — ABNORMAL HIGH (ref 65–99)

## 2016-10-12 MED ORDER — OXYCODONE-ACETAMINOPHEN 5-325 MG PO TABS: 1.0000 | ORAL_TABLET | Freq: Four times a day (QID) | ORAL | 0 refills | Status: DC | PRN

## 2016-10-12 MED ORDER — AMOXICILLIN-POT CLAVULANATE 875-125 MG PO TABS
1.0000 | ORAL_TABLET | Freq: Two times a day (BID) | ORAL | Status: DC
  Administered 2016-10-12: 1 via ORAL
  Filled 2016-10-12: qty 1

## 2016-10-12 MED ORDER — AMOXICILLIN-POT CLAVULANATE 875-125 MG PO TABS: 1.0000 | ORAL_TABLET | Freq: Two times a day (BID) | ORAL | Status: DC

## 2016-10-12 NOTE — Discharge Summary (Addendum)
PATIENT DETAILS Name: Maria [name] Age: 83 y.o. Sex: female Date of Birth: [birth year] MRN: [id number]. Admitting Physician: Etta Quill, DO ZY:[id number] Alroy Dust, MD  Admit Date: 10/06/2016 Discharge date: 10/12/2016  Recommendations for Outpatient Follow-up:  1. Follow up with PCP in 1-2 weeks 2. Please obtain LFTs in 1 week when patient completes antimicrobial therapy 3. Please ensure follow-up at interventional radiology drain clinic, general surgery  Admitted From:  Home  Disposition: SNF   Home Health: No  Equipment/Devices: None  Discharge Condition: Stable  CODE STATUS: FULL CODE  Diet recommendation:  Heart Healthy / Carb Modified  Brief Summary: See H&P, Labs, Consult and Test reports for all details in brief,Patient is a 83 y.o. female with history of complete heart block-status post permanent pacemaker implantation, hypertension, type 2 diabetes admitted with mostly right upper quadrant pain, vomiting-thought to have acute acalculous cholecystitis. Patient was subsequently admitted for further evaluation and treatment  Brief Hospital Course: Acute cholecystitis:Improved, underwent percutaneous drain placement on 1/26. Seen by cards and thought to be a high risk operative candidate-hence lap cholecystectomy not pursued. Bile cultures positive for pansensitive Klebsiella, patient was managed with intravenous Zosyn-this has been narrowed to Augmentin (stop date of 10/20/16).LFTs including bilirubin are downtrending, no choledocholithiasis or CBD dilatation seen on CT scan of the abdomen. Spoke with radiology PA-Kelly -unable to do cholangiogram through the existing gallbladder drain as cystic duct is not patent on HIDA scan. I subsequently spoke with GI M.D. Dr. Christen Butter over the phone on 1/30, he reviewed the patient's chart and labs-we do not think that this patient at this time has choledocholithiasis. Plans are to continue antimicrobial therapy, and repeat  LFTs in 1 week. If bilirubin is still up or if LFTs are deranged, patient will then need to be referred to gastroenterology. Patient will be followed by the IR drain clinic-plans are to leave gallbladder drain in place for at least 6 weeks.Note-unable to pursue MRCP as patient has PPM in place  Systemic inflammatory response syndrome: SIRS pathophysiology has resolved. Likely secondary to above, hemodynamics remain stable-doubt sepsis syndrome. Continue empiric antimicrobial therapy. Blood cultures negative so far, biliary cx positive for pansensitive Klebsiella. See above regarding antimicrobial therapy  Mild delirium: Supportive care-suspect secondary to narcotics, probable mild cognitive dysfunction at baseline. She is completely awake and alert at the time of discharge, per RN she did not have any delirium last night.   Minimally elevated troponins: Trend is mostly flat-I suspect this is demand ischemia rather than ACS. She does not have chest pain-pain is mostly in the right upper quadrant abdominal area. EKG shows a paced rhythm. Echo shows preserved EF with severe hypokinesis of the apex and anetero-sepal myocardium. Suspect further work up can be pursued in the outpatient setting-but with advance age-best managed medically. Since diet stable-will start ASA.  Asymptomatic bacteriuria: UA consistent with UTI-however patient symptoms are mostly consistent with cholecystitis. On antimicrobial therapy in any event that should cover  Type 2 diabetes: CBGs relatively stable with resume metformin on discharge. Continue to follow CBGs closely while at SNF. to hold oral hypoglycemic agents  Hypertension: Blood pressure now starting to creep up, we will resume usual antihypertensive regimen on discharge. Please follow BP trend at SNF.   History of complete heart block-has permanent pacemaker in place  Severe Pul HTN: seen on Echo-further work-in the outpatient setting-once recovered from acute  illness.   Generalized weakness/deconditioning: Secondary to acute illness-underlying frailty/advanced age-PT evaluation completed-plans are for SNF on  discharge.  Procedures/Studies: Echo 1/25>> Study Conclusions: - Left ventricle: The cavity size was normal. Wall thickness was increased in a pattern of mild LVH. Systolic function was normal. The estimated ejection fraction was in the range of 50% to 55%. Severe hypokinesis of the apical myocardium. Hypokinesis of the anteroseptal myocardium. Doppler parameters are consistent with abnormal left ventricular relaxation (grade 1 diastolic dysfunction). - Ventricular septum: The contour showed systolic flattening. These changes are consistent with RV pressure overload. - Mitral valve: There was mild regurgitation. - Right ventricle: The cavity size was mildly dilated. Wall thickness was normal. - Right atrium: The atrium was mildly dilated. - Tricuspid valve: There was moderate-severe regurgitation. - Pulmonary arteries: Systolic pressure was severely increased. PA peak pressure: 81 mm Hg (S).  Percutaneous drain placement on 1/26>>  Discharge Diagnoses:  Principal Problem:   Acute acalculous cholecystitis Active Problems:   Chest pain   HTN (hypertension)   DM type 2 (diabetes mellitus, type 2) (HCC)   Elevated troponin   Lactic acidosis   Sinus tachycardia   Discharge Instructions: Please check CBG's ac and hs   Activity:  As tolerated with Full fall precautions use walker/cane & assistance as needed  Discharge Instructions    Call MD for:  redness, tenderness, or signs of infection (pain, swelling, redness, odor or green/yellow discharge around incision site)    Complete by:  As directed    Call MD for:  severe uncontrolled pain    Complete by:  As directed    Diet - low sodium heart healthy    Complete by:  As directed    Diet general    Complete by:  As directed    Increase activity slowly     Complete by:  As directed      Allergies as of 10/12/2016   No Known Allergies     Medication List    STOP taking these medications   diclofenac 75 MG EC tablet Commonly known as:  VOLTAREN   meloxicam 15 MG tablet Commonly known as:  MOBIC   nabumetone 500 MG tablet Commonly known as:  RELAFEN   valACYclovir 1000 MG tablet Commonly known as:  VALTREX     TAKE these medications   acetaminophen 500 MG tablet Commonly known as:  TYLENOL Take 500 mg by mouth every 6 (six) hours as needed for mild pain.   amoxicillin-clavulanate 875-125 MG tablet Commonly known as:  AUGMENTIN Take 1 tablet by mouth every 12 (twelve) hours. STOP DATE 10/20/16   aspirin EC 81 MG tablet Take 81 mg by mouth daily.   Calcium-Magnesium-Vitamin D [id number] MG-MG-UNIT Tabs Take 1 tablet by mouth daily.   freestyle lancets   FREESTYLE LITE Devi   FREESTYLE LITE test strip Generic drug:  glucose blood   losartan-hydrochlorothiazide 50-12.5 MG tablet Commonly known as:  HYZAAR Take 1 tablet by mouth daily.   metFORMIN 500 MG tablet Commonly known as:  GLUCOPHAGE Take 1 tablet by mouth 2 (two) times daily.   multivitamin with minerals Tabs tablet Take 1 tablet by mouth daily.   OVER THE COUNTER MEDICATION Place 2 drops into both eyes daily as needed (for dry eyes).   oxyCODONE-acetaminophen 5-325 MG tablet Commonly known as:  PERCOCET/ROXICET Take 1-2 tablets by mouth every 6 (six) hours as needed for moderate pain.   potassium chloride SA 20 MEQ tablet Commonly known as:  K-DUR,KLOR-CON Take 20 mEq by mouth daily.   REFRESH OP Place 1 drop into both eyes 2 (two)  times daily as needed (for dry eyes).      Follow-up Information    Reyes Ivan, MD Follow up in 6 week(s).   Specialty:  General Surgery Contact information: [address] [place] [address] [phone]        Greggory Keen, MD Follow up.   Specialty:  Interventional  Radiology Why:  follow up in OP IR drain clinic 5-6 weeks; pt will hear from clinic for time and date; call [phone] if questions or concerns Contact information: [address] [place] [address] [phone]        Donnie Coffin, MD. Schedule an appointment as soon as possible for a visit in 2 week(s).   Specialty:  Family Medicine Contact information: [address] [place] [address] [phone]          No Known Allergies  Consultations:   cardiology, general surgery and IR   Other Procedures/Studies: Dg Chest 2 View  Result Date: 10/06/2016 CLINICAL DATA:  Mid chest pain for 2 days with shortness of breath EXAM: CHEST  2 VIEW COMPARISON:  04/04/2014 FINDINGS: Low volume film with stable asymmetric elevation right hemidiaphragm. The lungs are clear wiithout focal pneumonia, edema, pneumothorax or pleural effusion. Cardiopericardial silhouette is at upper limits of normal for size. Left-sided dual lead permanent pacemaker again noted. Degenerative changes are evident in the shoulders bilaterally. IMPRESSION: Stable.  No acute findings. Electronically Signed   By: Misty Stanley M.D.   On: 10/06/2016 18:53   Nm Hepatobiliary Including Gb  Result Date: 10/07/2016 CLINICAL DATA:  Acute acalculous cholecystitis EXAM: NUCLEAR MEDICINE HEPATOBILIARY IMAGING TECHNIQUE: Sequential images of the abdomen were obtained out to 60 minutes following intravenous administration of radiopharmaceutical. RADIOPHARMACEUTICALS:  5.2 mCi Tc-6m  Choletec IV COMPARISON:  CT abdomen pelvis 10/06/2016 FINDINGS: There is good uptake by the liver. Small bowel activity is noted beginning at 10 minutes. No gallbladder activity was seen at 1 hour The patient received 2.0 mg of IV morphine in attempt to visualize the gallbladder. Imaging was obtained for another 33 minutes at which time the patient refused further imaging. No gallbladder activity was demonstrated post  morphine administration. IMPRESSION: Negative for gallbladder activity including after morphine administration. Findings consistent with cystic duct obstruction. Electronically Signed   By: Franchot Gallo M.D.   On: 10/07/2016 11:25   Ct Abdomen Pelvis W Contrast  Result Date: 10/07/2016 CLINICAL DATA:  Chest pain with nausea and vomiting. EXAM: CT ABDOMEN AND PELVIS WITH CONTRAST TECHNIQUE: Multidetector CT imaging of the abdomen and pelvis was performed using the standard protocol following bolus administration of intravenous contrast. CONTRAST:  170mL ISOVUE-300 IOPAMIDOL (ISOVUE-300) INJECTION 61% COMPARISON:  None. FINDINGS: Lower chest: Borderline cardiomegaly with right atrial and right ventricular pacing leads noted. No pericardial effusion. Bibasilar atelectasis. No pneumonic consolidation, effusion or pneumothorax. Hepatobiliary: Mild fatty infiltration of the liver without space-occupying mass. No biliary dilatation. Distended gallbladder with mild wall thickening up to 4 mm and pericholecystic fluid concerning for acalculus cholecystitis. No gallstones or choledocholithiasis. Pancreas: Unremarkable Spleen: No splenomegaly.  Small splenule at the hilum. Adrenals/Urinary Tract: Normal bilateral adrenal glands. Tiny too small to further characterize 5 mm left upper abdominal and interpolar and lower pole hypodensity statistically consistent with cysts. No hydroureteronephrosis. Urinary bladder is physiologically distended. Stomach/Bowel: Nondistended stomach. Normal small bowel rotation. No acute bowel obstruction or inflammation. Vascular/Lymphatic: Moderate aortoiliac atherosclerosis. No aneurysm. No lymphadenopathy. Reproductive: Fibroid uterus with a noncalcified intramural 12 mm  in diameter fibroid with partially calcified 16 mm left-sided subserosal fibroid. A few calcifications are noted intramural in etiology on the left which may also reflect tiny calcified fibroids. Other: No abdominal wall  hernia or abnormality. No abdominopelvic ascites. Musculoskeletal: Lumbar degenerative disc disease L2 through L5 with small posterior marginal osteophytes. Degenerative joint space narrowing of both hips with spurring off the femoral head- neck junctures. No acute osseous abnormality. IMPRESSION: 1. Distended slightly thick-walled gallbladder with pericholecystic fluid concerning for acalculus cholecystitis. 2. Colonic diverticulosis without acute diverticulitis. 3. Fibroid uterus. 4. Subcentimeter left renal cysts too small for further characterize. Electronically Signed   By: Ashley Royalty M.D.   On: 10/07/2016 00:23   Ir Perc Cholecystostomy  Result Date: 10/08/2016 INDICATION: ACUTE CHOLECYSTITIS, NON OPERATIVE CANDIDATE EXAM: CHOLECYSTOSTOMY MEDICATIONS: 3.375 g Zosyn; The antibiotic was administered within an appropriate time frame prior to the initiation of the procedure. ANESTHESIA/SEDATION: Moderate (conscious) sedation was employed during this procedure. A total of Versed 2.0 mg and Fentanyl 100 mcg was administered intravenously. Moderate Sedation Time: 10 minutes. The patient's level of consciousness and vital signs were monitored continuously by radiology nursing throughout the procedure under my direct supervision. FLUOROSCOPY TIME:  Fluoroscopy Time:  30 seconds (3 mGy). COMPLICATIONS: None immediate. PROCEDURE: Informed written consent was obtained from the patient after a thorough discussion of the procedural risks, benefits and alternatives. All questions were addressed. Maximal Sterile Barrier Technique was utilized including caps, mask, sterile gowns, sterile gloves, sterile drape, hand hygiene and skin antiseptic. A timeout was performed prior to the initiation of the procedure. Previous imaging reviewed. Preliminary ultrasound performed. Gallbladder demonstrated a right upper quadrant. Percutaneous transhepatic window was localized. Overlying skin marked. Under sterile conditions and local  anesthesia, ultrasound percutaneous transhepatic access performed of the distended gallbladder. Needle position confirmed with ultrasound. Images obtained for documentation. There was return of bile. Sample sent for culture. Guidewire inserted followed by Accustick dilator set. Amplatz guidewire exchange performed. Tract dilatation performed to insert a 10 [place] drain. Retention loop formed in the gallbladder. Position confirmed with fluoroscopy. Additional 30 cc purulent bile aspirated. This decompressed the gallbladder by ultrasound. Catheter secured with a Prolene suture and connected to external gravity drainage bag. Sterile dressing applied. No immediate complication. Patient tolerated the procedure well. IMPRESSION: Successful ultrasound and fluoroscopic percutaneous transhepatic cholecystostomy. Electronically Signed   By: Jerilynn Mages.  Shick M.D.   On: 10/08/2016 11:36   Dg Chest Port 1 View  Result Date: 10/08/2016 CLINICAL DATA:  83 year old female with increasing shortness of breath. EXAM: PORTABLE CHEST 1 VIEW COMPARISON:  Chest radiograph dated 10/06/2016 FINDINGS: There is low lung volumes with stable eventration of the right hemidiaphragm. Left lung base density appears similar to prior radiograph and likely represents atelectatic changes. Infiltrate is not excluded. Clinical correlation is recommended. There is no pleural effusion or pneumothorax. Stable cardiac silhouette. Left pectoral dual lead pacemaker wires. There is degenerative changes of the spine and shoulders. No acute fracture. IMPRESSION: No significant interval change. Left lung base density likely atelectatic changes. Developing infiltrate is not excluded. Clinical correlation is recommended. Electronically Signed   By: Anner Crete M.D.   On: 10/08/2016 01:11     TODAY-DAY OF DISCHARGE:  Subjective:   Maria [name] today has no headache,no chest abdominal pain,no new weakness tingling or numbness, feels much better wants to go  home today.   Objective:   Blood pressure 131/68, pulse 79, temperature 98.1 F (36.7 C), temperature source Oral, resp. rate 18, height 5'  2" (1.575 m), weight 70.5 kg (155 lb 8 oz), SpO2 98 %.  Intake/Output Summary (Last 24 hours) at 10/12/16 0927 Last data filed at 10/12/16 0545  Gross per 24 hour  Intake              460 ml  Output              650 ml  Net             -190 ml   Filed Weights   10/10/16 0319 10/11/16 0429 10/12/16 0423  Weight: 75.6 kg (166 lb 10.7 oz) 70.4 kg (155 lb 3.3 oz) 70.5 kg (155 lb 8 oz)    Exam: Awake Alert, Oriented *3, No new F.N deficits, Normal affect [place].AT,PERRAL Supple Neck,No JVD, No cervical lymphadenopathy appriciated.  Symmetrical Chest wall movement, Good air movement bilaterally, CTAB RRR,No Gallops,Rubs or new Murmurs, No Parasternal Heave +ve B.Sounds, Abd Soft, Non tender, No organomegaly appriciated, No rebound -guarding or rigidity. No Cyanosis, Clubbing or edema, No new Rash or bruise   PERTINENT RADIOLOGIC STUDIES: Dg Chest 2 View  Result Date: 10/06/2016 CLINICAL DATA:  Mid chest pain for 2 days with shortness of breath EXAM: CHEST  2 VIEW COMPARISON:  04/04/2014 FINDINGS: Low volume film with stable asymmetric elevation right hemidiaphragm. The lungs are clear wiithout focal pneumonia, edema, pneumothorax or pleural effusion. Cardiopericardial silhouette is at upper limits of normal for size. Left-sided dual lead permanent pacemaker again noted. Degenerative changes are evident in the shoulders bilaterally. IMPRESSION: Stable.  No acute findings. Electronically Signed   By: Misty Stanley M.D.   On: 10/06/2016 18:53   Nm Hepatobiliary Including Gb  Result Date: 10/07/2016 CLINICAL DATA:  Acute acalculous cholecystitis EXAM: NUCLEAR MEDICINE HEPATOBILIARY IMAGING TECHNIQUE: Sequential images of the abdomen were obtained out to 60 minutes following intravenous administration of radiopharmaceutical. RADIOPHARMACEUTICALS:  5.2 mCi  Tc-34m  Choletec IV COMPARISON:  CT abdomen pelvis 10/06/2016 FINDINGS: There is good uptake by the liver. Small bowel activity is noted beginning at 10 minutes. No gallbladder activity was seen at 1 hour The patient received 2.0 mg of IV morphine in attempt to visualize the gallbladder. Imaging was obtained for another 33 minutes at which time the patient refused further imaging. No gallbladder activity was demonstrated post morphine administration. IMPRESSION: Negative for gallbladder activity including after morphine administration. Findings consistent with cystic duct obstruction. Electronically Signed   By: Franchot Gallo M.D.   On: 10/07/2016 11:25   Ct Abdomen Pelvis W Contrast  Result Date: 10/07/2016 CLINICAL DATA:  Chest pain with nausea and vomiting. EXAM: CT ABDOMEN AND PELVIS WITH CONTRAST TECHNIQUE: Multidetector CT imaging of the abdomen and pelvis was performed using the standard protocol following bolus administration of intravenous contrast. CONTRAST:  149mL ISOVUE-300 IOPAMIDOL (ISOVUE-300) INJECTION 61% COMPARISON:  None. FINDINGS: Lower chest: Borderline cardiomegaly with right atrial and right ventricular pacing leads noted. No pericardial effusion. Bibasilar atelectasis. No pneumonic consolidation, effusion or pneumothorax. Hepatobiliary: Mild fatty infiltration of the liver without space-occupying mass. No biliary dilatation. Distended gallbladder with mild wall thickening up to 4 mm and pericholecystic fluid concerning for acalculus cholecystitis. No gallstones or choledocholithiasis. Pancreas: Unremarkable Spleen: No splenomegaly.  Small splenule at the hilum. Adrenals/Urinary Tract: Normal bilateral adrenal glands. Tiny too small to further characterize 5 mm left upper abdominal and interpolar and lower pole hypodensity statistically consistent with cysts. No hydroureteronephrosis. Urinary bladder is physiologically distended. Stomach/Bowel: Nondistended stomach. Normal small bowel  rotation. No acute bowel obstruction or  inflammation. Vascular/Lymphatic: Moderate aortoiliac atherosclerosis. No aneurysm. No lymphadenopathy. Reproductive: Fibroid uterus with a noncalcified intramural 12 mm in diameter fibroid with partially calcified 16 mm left-sided subserosal fibroid. A few calcifications are noted intramural in etiology on the left which may also reflect tiny calcified fibroids. Other: No abdominal wall hernia or abnormality. No abdominopelvic ascites. Musculoskeletal: Lumbar degenerative disc disease L2 through L5 with small posterior marginal osteophytes. Degenerative joint space narrowing of both hips with spurring off the femoral head- neck junctures. No acute osseous abnormality. IMPRESSION: 1. Distended slightly thick-walled gallbladder with pericholecystic fluid concerning for acalculus cholecystitis. 2. Colonic diverticulosis without acute diverticulitis. 3. Fibroid uterus. 4. Subcentimeter left renal cysts too small for further characterize. Electronically Signed   By: Ashley Royalty M.D.   On: 10/07/2016 00:23   Ir Perc Cholecystostomy  Result Date: 10/08/2016 INDICATION: ACUTE CHOLECYSTITIS, NON OPERATIVE CANDIDATE EXAM: CHOLECYSTOSTOMY MEDICATIONS: 3.375 g Zosyn; The antibiotic was administered within an appropriate time frame prior to the initiation of the procedure. ANESTHESIA/SEDATION: Moderate (conscious) sedation was employed during this procedure. A total of Versed 2.0 mg and Fentanyl 100 mcg was administered intravenously. Moderate Sedation Time: 10 minutes. The patient's level of consciousness and vital signs were monitored continuously by radiology nursing throughout the procedure under my direct supervision. FLUOROSCOPY TIME:  Fluoroscopy Time:  30 seconds (3 mGy). COMPLICATIONS: None immediate. PROCEDURE: Informed written consent was obtained from the patient after a thorough discussion of the procedural risks, benefits and alternatives. All questions were addressed.  Maximal Sterile Barrier Technique was utilized including caps, mask, sterile gowns, sterile gloves, sterile drape, hand hygiene and skin antiseptic. A timeout was performed prior to the initiation of the procedure. Previous imaging reviewed. Preliminary ultrasound performed. Gallbladder demonstrated a right upper quadrant. Percutaneous transhepatic window was localized. Overlying skin marked. Under sterile conditions and local anesthesia, ultrasound percutaneous transhepatic access performed of the distended gallbladder. Needle position confirmed with ultrasound. Images obtained for documentation. There was return of bile. Sample sent for culture. Guidewire inserted followed by Accustick dilator set. Amplatz guidewire exchange performed. Tract dilatation performed to insert a 10 [place] drain. Retention loop formed in the gallbladder. Position confirmed with fluoroscopy. Additional 30 cc purulent bile aspirated. This decompressed the gallbladder by ultrasound. Catheter secured with a Prolene suture and connected to external gravity drainage bag. Sterile dressing applied. No immediate complication. Patient tolerated the procedure well. IMPRESSION: Successful ultrasound and fluoroscopic percutaneous transhepatic cholecystostomy. Electronically Signed   By: Jerilynn Mages.  Shick M.D.   On: 10/08/2016 11:36   Dg Chest Port 1 View  Result Date: 10/08/2016 CLINICAL DATA:  83 year old female with increasing shortness of breath. EXAM: PORTABLE CHEST 1 VIEW COMPARISON:  Chest radiograph dated 10/06/2016 FINDINGS: There is low lung volumes with stable eventration of the right hemidiaphragm. Left lung base density appears similar to prior radiograph and likely represents atelectatic changes. Infiltrate is not excluded. Clinical correlation is recommended. There is no pleural effusion or pneumothorax. Stable cardiac silhouette. Left pectoral dual lead pacemaker wires. There is degenerative changes of the spine and shoulders. No acute  fracture. IMPRESSION: No significant interval change. Left lung base density likely atelectatic changes. Developing infiltrate is not excluded. Clinical correlation is recommended. Electronically Signed   By: Anner Crete M.D.   On: 10/08/2016 01:11     PERTINENT LAB RESULTS: CBC: No results for input(s): WBC, HGB, HCT, PLT in the last 72 hours. CMET CMP     Component Value Date/Time   [name] 140 10/12/2016 0636   K  4.2 10/12/2016 0636   CL 110 10/12/2016 0636   CO2 22 10/12/2016 0636   GLUCOSE 135 (H) 10/12/2016 0636   BUN 8 10/12/2016 0636   CREATININE 0.79 10/12/2016 0636   CALCIUM 8.2 (L) 10/12/2016 0636   PROT 6.5 10/12/2016 0636   ALBUMIN 2.8 (L) 10/12/2016 0636   AST 35 10/12/2016 0636   ALT 51 10/12/2016 0636   ALKPHOS 92 10/12/2016 0636   BILITOT 2.3 (H) 10/12/2016 0636   GFRNONAA >60 10/12/2016 0636   GFRAA >60 10/12/2016 0636    GFR Estimated Creatinine Clearance: 43 mL/min (by C-G formula based on SCr of 0.79 mg/dL). No results for input(s): LIPASE, AMYLASE in the last 72 hours. No results for input(s): CKTOTAL, CKMB, CKMBINDEX, TROPONINI in the last 72 hours. Invalid input(s): POCBNP No results for input(s): DDIMER in the last 72 hours. No results for input(s): HGBA1C in the last 72 hours. No results for input(s): CHOL, HDL, LDLCALC, TRIG, CHOLHDL, LDLDIRECT in the last 72 hours. No results for input(s): TSH, T4TOTAL, T3FREE, THYROIDAB in the last 72 hours.  Invalid input(s): FREET3 No results for input(s): VITAMINB12, FOLATE, FERRITIN, TIBC, IRON, RETICCTPCT in the last 72 hours. Coags: No results for input(s): INR in the last 72 hours.  Invalid input(s): PT Microbiology: Recent Results (from the past 240 hour(s))  Blood culture (routine x 2)     Status: None (Preliminary result)   Collection Time: 10/07/16 12:20 AM  Result Value Ref Range Status   Specimen Description BLOOD RIGHT ANTECUBITAL  Final   Special Requests IN PEDIATRIC BOTTLE 2CC  Final    Culture NO GROWTH 4 DAYS  Final   Report Status PENDING  Incomplete  Urine culture     Status: Abnormal   Collection Time: 10/07/16 12:34 AM  Result Value Ref Range Status   Specimen Description URINE, RANDOM  Final   Special Requests NONE  Final   Culture >=100,000 COLONIES/mL ESCHERICHIA COLI (A)  Final   Report Status 10/09/2016 FINAL  Final   Organism ID, Bacteria ESCHERICHIA COLI (A)  Final      Susceptibility   Escherichia coli - MIC*    AMPICILLIN <=2 SENSITIVE Sensitive     CEFAZOLIN <=4 SENSITIVE Sensitive     CEFTRIAXONE <=1 SENSITIVE Sensitive     CIPROFLOXACIN <=0.25 SENSITIVE Sensitive     GENTAMICIN <=1 SENSITIVE Sensitive     IMIPENEM <=0.25 SENSITIVE Sensitive     NITROFURANTOIN <=16 SENSITIVE Sensitive     TRIMETH/SULFA <=20 SENSITIVE Sensitive     AMPICILLIN/SULBACTAM <=2 SENSITIVE Sensitive     PIP/TAZO <=4 SENSITIVE Sensitive     Extended ESBL NEGATIVE Sensitive     * >=100,000 COLONIES/mL ESCHERICHIA COLI  Blood culture (routine x 2)     Status: None (Preliminary result)   Collection Time: 10/07/16  1:30 AM  Result Value Ref Range Status   Specimen Description BLOOD LEFT ANTECUBITAL  Final   Special Requests BOTTLES DRAWN AEROBIC AND ANAEROBIC 10CC  Final   Culture NO GROWTH 4 DAYS  Final   Report Status PENDING  Incomplete  MRSA PCR Screening     Status: None   Collection Time: 10/07/16  6:03 AM  Result Value Ref Range Status   MRSA by PCR NEGATIVE NEGATIVE Final    Comment:        The GeneXpert MRSA Assay (FDA approved for NASAL specimens only), is one component of a comprehensive MRSA colonization surveillance program. It is not intended to diagnose MRSA infection nor to  guide or monitor treatment for MRSA infections.   Aerobic/Anaerobic Culture (surgical/deep wound)     Status: None (Preliminary result)   Collection Time: 10/08/16 11:41 AM  Result Value Ref Range Status   Specimen Description GALL BLADDER  Final   Special Requests BILE  Final    Gram Stain   Final    ABUNDANT WBC PRESENT, PREDOMINANTLY PMN ABUNDANT GRAM NEGATIVE RODS    Culture   Final    MODERATE KLEBSIELLA PNEUMONIAE NO ANAEROBES ISOLATED; CULTURE IN PROGRESS FOR 5 DAYS    Report Status PENDING  Incomplete   Organism ID, Bacteria KLEBSIELLA PNEUMONIAE  Final      Susceptibility   Klebsiella pneumoniae - MIC*    AMPICILLIN 16 RESISTANT Resistant     CEFAZOLIN <=4 SENSITIVE Sensitive     CEFEPIME <=1 SENSITIVE Sensitive     CEFTAZIDIME <=1 SENSITIVE Sensitive     CEFTRIAXONE <=1 SENSITIVE Sensitive     CIPROFLOXACIN <=0.25 SENSITIVE Sensitive     GENTAMICIN <=1 SENSITIVE Sensitive     IMIPENEM <=0.25 SENSITIVE Sensitive     TRIMETH/SULFA <=20 SENSITIVE Sensitive     AMPICILLIN/SULBACTAM <=2 SENSITIVE Sensitive     PIP/TAZO <=4 SENSITIVE Sensitive     Extended ESBL NEGATIVE Sensitive     * MODERATE KLEBSIELLA PNEUMONIAE    FURTHER DISCHARGE INSTRUCTIONS:  Get Medicines reviewed and adjusted: Please take all your medications with you for your next visit with your Primary MD  Laboratory/radiological data: Please request your Primary MD to go over all hospital tests and procedure/radiological results at the follow up, please ask your Primary MD to get all Hospital records sent to his/her office.  In some cases, they will be blood work, cultures and biopsy results pending at the time of your discharge. Please request that your primary care M.D. goes through all the records of your hospital data and follows up on these results.  Also Note the following: If you experience worsening of your admission symptoms, develop shortness of breath, life threatening emergency, suicidal or homicidal thoughts you must seek medical attention immediately by calling 911 or calling your MD immediately  if symptoms less severe.  You must read complete instructions/literature along with all the possible adverse reactions/side effects for all the Medicines you take and that  have been prescribed to you. Take any new Medicines after you have completely understood and accpet all the possible adverse reactions/side effects.   Do not drive when taking Pain medications or sleeping medications (Benzodaizepines)  Do not take more than prescribed Pain, Sleep and Anxiety Medications. It is not advisable to combine anxiety,sleep and pain medications without talking with your primary care practitioner  Special Instructions: If you have smoked or chewed Tobacco  in the last 2 yrs please stop smoking, stop any regular Alcohol  and or any Recreational drug use.  Wear Seat belts while driving.  Please note: You were cared for by a hospitalist during your hospital stay. Once you are discharged, your primary care physician will handle any further medical issues. Please note that NO REFILLS for any discharge medications will be authorized once you are discharged, as it is imperative that you return to your primary care physician (or establish a relationship with a primary care physician if you do not have one) for your post hospital discharge needs so that they can reassess your need for medications and monitor your lab values.  Total Time spent coordinating discharge including counseling, education and face to face time equals 45  minutes.  SignedOren Binet 10/12/2016 9:27 AM

## 2016-10-12 NOTE — Progress Notes (Signed)
Patient will DC to: [facility]  Anticipated DC date: 10/12/16  Family notified: Daughter Transport by: [facility] 12:30pm   Per MD patient ready for DC to [facility]. RN, patient, patient's family, and facility notified of DC. Discharge Summary sent to facility. RN given number for report. DC packet on chart. Ambulance transport requested for patient.   CSW signing off.  Cedric Fishman, [place] Social Worker [phone]

## 2016-10-12 NOTE — Clinical Social Work Placement (Signed)
   CLINICAL SOCIAL WORK PLACEMENT  NOTE  Date:  10/12/2016  Patient Details  Name: Maria [name] MRN: [id number] Date of Birth: December 09, 1923  Clinical Social Work is seeking post-discharge placement for this patient at the [place] level of care (*CSW will initial, date and re-position this form in  chart as items are completed):  Yes   Patient/family provided with [place] Work Department's list of facilities offering this level of care within the geographic area requested by the patient (or if unable, by the patient's family).  Yes   Patient/family informed of their freedom to choose among providers that offer the needed level of care, that participate in Medicare, Medicaid or managed care program needed by the patient, have an available bed and are willing to accept the patient.  Yes   Patient/family informed of [facility]'s ownership interest in [facility] and [facility], as well as of the fact that they are under no obligation to receive care at these facilities.  PASRR submitted to EDS on 10/10/16     PASRR number received on 10/10/16     Existing PASRR number confirmed on       FL2 transmitted to all facilities in geographic area requested by pt/family on 10/10/16     FL2 transmitted to all facilities within larger geographic area on       Patient informed that his/her managed care company has contracts with or will negotiate with certain facilities, including the following:        Yes   Patient/family informed of bed offers received.  Patient chooses bed at [facility]     Physician recommends and patient chooses bed at      Patient to be transferred to [facility] on 10/12/16.  Patient to be transferred to facility by [facility]     Patient family notified on 10/12/16 of transfer.  Name of family member notified:  Daughter     PHYSICIAN       Additional Comment:     _______________________________________________ Benard Halsted, [place] 10/12/2016, 10:05 AM

## 2016-10-12 NOTE — Progress Notes (Signed)
Report called to [place] at [facility]. All questions answered.

## 2016-10-13 LAB — AEROBIC/ANAEROBIC CULTURE (SURGICAL/DEEP WOUND)

## 2016-10-13 LAB — AEROBIC/ANAEROBIC CULTURE W GRAM STAIN (SURGICAL/DEEP WOUND)

## 2016-10-15 DIAGNOSIS — I10 Essential (primary) hypertension: Secondary | ICD-10-CM | POA: Diagnosis not present

## 2016-10-15 DIAGNOSIS — I2510 Atherosclerotic heart disease of native coronary artery without angina pectoris: Secondary | ICD-10-CM | POA: Diagnosis not present

## 2016-10-15 DIAGNOSIS — E119 Type 2 diabetes mellitus without complications: Secondary | ICD-10-CM | POA: Diagnosis not present

## 2016-10-15 DIAGNOSIS — K810 Acute cholecystitis: Secondary | ICD-10-CM | POA: Diagnosis not present

## 2016-11-03 DIAGNOSIS — I25119 Atherosclerotic heart disease of native coronary artery with unspecified angina pectoris: Secondary | ICD-10-CM | POA: Diagnosis not present

## 2016-11-03 DIAGNOSIS — I272 Pulmonary hypertension, unspecified: Secondary | ICD-10-CM | POA: Diagnosis not present

## 2016-11-03 DIAGNOSIS — I442 Atrioventricular block, complete: Secondary | ICD-10-CM | POA: Diagnosis not present

## 2016-11-03 DIAGNOSIS — K810 Acute cholecystitis: Secondary | ICD-10-CM | POA: Diagnosis not present

## 2016-11-03 DIAGNOSIS — E119 Type 2 diabetes mellitus without complications: Secondary | ICD-10-CM | POA: Diagnosis not present

## 2016-11-03 DIAGNOSIS — I10 Essential (primary) hypertension: Secondary | ICD-10-CM | POA: Diagnosis not present

## 2016-11-05 DIAGNOSIS — K810 Acute cholecystitis: Secondary | ICD-10-CM | POA: Diagnosis not present

## 2016-11-05 DIAGNOSIS — I25119 Atherosclerotic heart disease of native coronary artery with unspecified angina pectoris: Secondary | ICD-10-CM | POA: Diagnosis not present

## 2016-11-05 DIAGNOSIS — I442 Atrioventricular block, complete: Secondary | ICD-10-CM | POA: Diagnosis not present

## 2016-11-05 DIAGNOSIS — I272 Pulmonary hypertension, unspecified: Secondary | ICD-10-CM | POA: Diagnosis not present

## 2016-11-05 DIAGNOSIS — E119 Type 2 diabetes mellitus without complications: Secondary | ICD-10-CM | POA: Diagnosis not present

## 2016-11-05 DIAGNOSIS — I10 Essential (primary) hypertension: Secondary | ICD-10-CM | POA: Diagnosis not present

## 2016-11-09 DIAGNOSIS — I272 Pulmonary hypertension, unspecified: Secondary | ICD-10-CM | POA: Diagnosis not present

## 2016-11-09 DIAGNOSIS — I442 Atrioventricular block, complete: Secondary | ICD-10-CM | POA: Diagnosis not present

## 2016-11-09 DIAGNOSIS — I10 Essential (primary) hypertension: Secondary | ICD-10-CM | POA: Diagnosis not present

## 2016-11-09 DIAGNOSIS — I25119 Atherosclerotic heart disease of native coronary artery with unspecified angina pectoris: Secondary | ICD-10-CM | POA: Diagnosis not present

## 2016-11-09 DIAGNOSIS — E119 Type 2 diabetes mellitus without complications: Secondary | ICD-10-CM | POA: Diagnosis not present

## 2016-11-09 DIAGNOSIS — K810 Acute cholecystitis: Secondary | ICD-10-CM | POA: Diagnosis not present

## 2016-11-10 DIAGNOSIS — E119 Type 2 diabetes mellitus without complications: Secondary | ICD-10-CM | POA: Diagnosis not present

## 2016-11-10 DIAGNOSIS — I25119 Atherosclerotic heart disease of native coronary artery with unspecified angina pectoris: Secondary | ICD-10-CM | POA: Diagnosis not present

## 2016-11-10 DIAGNOSIS — I442 Atrioventricular block, complete: Secondary | ICD-10-CM | POA: Diagnosis not present

## 2016-11-10 DIAGNOSIS — I10 Essential (primary) hypertension: Secondary | ICD-10-CM | POA: Diagnosis not present

## 2016-11-10 DIAGNOSIS — K810 Acute cholecystitis: Secondary | ICD-10-CM | POA: Diagnosis not present

## 2016-11-10 DIAGNOSIS — I272 Pulmonary hypertension, unspecified: Secondary | ICD-10-CM | POA: Diagnosis not present

## 2016-11-11 DIAGNOSIS — I272 Pulmonary hypertension, unspecified: Secondary | ICD-10-CM | POA: Diagnosis not present

## 2016-11-11 DIAGNOSIS — K810 Acute cholecystitis: Secondary | ICD-10-CM | POA: Diagnosis not present

## 2016-11-11 DIAGNOSIS — I25119 Atherosclerotic heart disease of native coronary artery with unspecified angina pectoris: Secondary | ICD-10-CM | POA: Diagnosis not present

## 2016-11-11 DIAGNOSIS — E119 Type 2 diabetes mellitus without complications: Secondary | ICD-10-CM | POA: Diagnosis not present

## 2016-11-11 DIAGNOSIS — I442 Atrioventricular block, complete: Secondary | ICD-10-CM | POA: Diagnosis not present

## 2016-11-11 DIAGNOSIS — I10 Essential (primary) hypertension: Secondary | ICD-10-CM | POA: Diagnosis not present

## 2016-11-12 DIAGNOSIS — I25119 Atherosclerotic heart disease of native coronary artery with unspecified angina pectoris: Secondary | ICD-10-CM | POA: Diagnosis not present

## 2016-11-12 DIAGNOSIS — I272 Pulmonary hypertension, unspecified: Secondary | ICD-10-CM | POA: Diagnosis not present

## 2016-11-12 DIAGNOSIS — I10 Essential (primary) hypertension: Secondary | ICD-10-CM | POA: Diagnosis not present

## 2016-11-12 DIAGNOSIS — K810 Acute cholecystitis: Secondary | ICD-10-CM | POA: Diagnosis not present

## 2016-11-12 DIAGNOSIS — E119 Type 2 diabetes mellitus without complications: Secondary | ICD-10-CM | POA: Diagnosis not present

## 2016-11-12 DIAGNOSIS — I442 Atrioventricular block, complete: Secondary | ICD-10-CM | POA: Diagnosis not present

## 2016-11-15 DIAGNOSIS — K819 Cholecystitis, unspecified: Secondary | ICD-10-CM | POA: Diagnosis not present

## 2016-11-15 DIAGNOSIS — E119 Type 2 diabetes mellitus without complications: Secondary | ICD-10-CM | POA: Diagnosis not present

## 2016-11-15 DIAGNOSIS — I10 Essential (primary) hypertension: Secondary | ICD-10-CM | POA: Diagnosis not present

## 2016-11-16 ENCOUNTER — Ambulatory Visit
Admission: RE | Admit: 2016-11-16 | Discharge: 2016-11-16 | Disposition: A | Discharge status: Home / Self Care | Payer: Medicare Other | Source: Ambulatory Visit | Attending: Radiology | Admitting: Radiology

## 2016-11-16 ENCOUNTER — Other Ambulatory Visit: Payer: Self-pay | Admitting: General Surgery

## 2016-11-16 ENCOUNTER — Ambulatory Visit
Admission: RE | Admit: 2016-11-16 | Discharge: 2016-11-16 | Disposition: A | Discharge status: Home / Self Care | Payer: Medicare Other | Source: Ambulatory Visit | Attending: General Surgery | Admitting: General Surgery

## 2016-11-16 DIAGNOSIS — I272 Pulmonary hypertension, unspecified: Secondary | ICD-10-CM | POA: Diagnosis not present

## 2016-11-16 DIAGNOSIS — K810 Acute cholecystitis: Secondary | ICD-10-CM | POA: Diagnosis not present

## 2016-11-16 DIAGNOSIS — K819 Cholecystitis, unspecified: Secondary | ICD-10-CM

## 2016-11-16 DIAGNOSIS — I10 Essential (primary) hypertension: Secondary | ICD-10-CM | POA: Diagnosis not present

## 2016-11-16 DIAGNOSIS — E119 Type 2 diabetes mellitus without complications: Secondary | ICD-10-CM | POA: Diagnosis not present

## 2016-11-16 DIAGNOSIS — I442 Atrioventricular block, complete: Secondary | ICD-10-CM | POA: Diagnosis not present

## 2016-11-16 DIAGNOSIS — I25119 Atherosclerotic heart disease of native coronary artery with unspecified angina pectoris: Secondary | ICD-10-CM | POA: Diagnosis not present

## 2016-11-16 HISTORY — PX: IR RADIOLOGIST EVAL & MGMT: IMG5224

## 2016-11-16 MED FILL — MONOJECT 0.9% NA CL SYRING: 0.9 | 20 days supply | Qty: 200 | Fill #0

## 2016-11-16 NOTE — Progress Notes (Signed)
Patient ID: Maria [name], female   DOB: Jan 01, 1924, 83 y.o.   MRN: [id number]   Referring Physician(s): Ralene Ok  Chief Complaint: The patient is seen in follow up today s/p percutaneous cholecystostomy drain placement on 10-08-16  History of present illness:  Maria [name] is a pleasant 83 yo female who was noted to have cholecystitis in late January.  Unfortunately, she was noted to be a high cardiac risk patient for general anesthesia.  Therefore, surgery elected to place a cholecystostomy drain as opposed to surgical intervention at that time.  She has done well with her drain so far.  It is being emptied twice a week when home health comes to see the patient.  They are not documenting output quantity and the drain is not being flushed.  The daughter states that the patient is still having some RUQ tenderness at times, but denies any fevers or infectious symptoms.  She presents today for a drain injection/cholangiogram.    Past Medical History:  Diagnosis Date  . Anxiety   . Arthritis   . Cancer ([place])    breast 2015  . Cardiac pacemaker in situ   . Chest pain   . Diabetes mellitus without complication ([place])   . DM type 2 (diabetes mellitus, type 2) ([place])   . Dyslipidemia   . Hypertension   . Type II or unspecified type diabetes mellitus without mention of complication, not stated as uncontrolled     Past Surgical History:  Procedure Laterality Date  . CATARACT EXTRACTION    . IR GENERIC HISTORICAL  10/08/2016   IR PERC CHOLECYSTOSTOMY 10/08/2016 Greggory Keen, MD [facility]  . KNEE SURGERY Bilateral   . PACEMAKER INSERTION     [employer] 2003  . PACEMAKER INSERTION    . TOTAL KNEE ARTHROPLASTY Bilateral     Allergies: Patient has no known allergies.  Medications: Prior to Admission medications   Medication Sig Start Date End Date Taking? Authorizing Provider  acetaminophen (TYLENOL) 500 MG tablet Take 500 mg by mouth every 6 (six) hours as needed for  mild pain.    Historical Provider, MD  amoxicillin-clavulanate (AUGMENTIN) 875-125 MG tablet Take 1 tablet by mouth every 12 (twelve) hours. STOP DATE 10/20/16 10/12/16   Jonetta Osgood, MD  aspirin EC 81 MG tablet Take 81 mg by mouth daily.    Historical Provider, MD  Blood Glucose Monitoring Suppl (FREESTYLE LITE) DEVI  08/28/14   Historical Provider, MD  Calcium-Magnesium-Vitamin D [id number] MG-MG-UNIT TABS Take 1 tablet by mouth daily.    Historical Provider, MD  FREESTYLE LITE test strip  08/28/14   Historical Provider, MD  Lancets (FREESTYLE) lancets  08/28/14   Historical Provider, MD  losartan-hydrochlorothiazide (HYZAAR) 50-12.5 MG per tablet Take 1 tablet by mouth daily. 09/11/14   Historical Provider, MD  metFORMIN (GLUCOPHAGE) 500 MG tablet Take 1 tablet by mouth 2 (two) times daily. 09/09/14   Historical Provider, MD  Multiple Vitamin (MULTIVITAMIN WITH MINERALS) TABS tablet Take 1 tablet by mouth daily.    Historical Provider, MD  OVER THE COUNTER MEDICATION Place 2 drops into both eyes daily as needed (for dry eyes).    Historical Provider, MD  oxyCODONE-acetaminophen (PERCOCET/ROXICET) 5-325 MG tablet Take 1-2 tablets by mouth every 6 (six) hours as needed for moderate pain. 10/12/16   Shanker Kristeen Mans, MD  Polyvinyl Alcohol-Povidone (REFRESH OP) Place 1 drop into both eyes 2 (two) times daily as needed (for dry eyes).    Historical Provider,  MD  potassium chloride SA (K-DUR,KLOR-CON) 20 MEQ tablet Take 20 mEq by mouth daily.    Historical Provider, MD     Family History  Problem Relation Age of Onset  . Colon cancer Neg Hx     Social History   Social History  . Marital status: Widowed    Spouse name: N/A  . Number of children: 2  . Years of education: N/A   Social History Main Topics  . Smoking status: Never Smoker  . Smokeless tobacco: Never Used  . Alcohol use No  . Drug use: No  . Sexual activity: Not on file   Other Topics Concern  . Not on file   Social  History Narrative   ** Merged History Encounter **         Vital Signs: BP (!) 144/73   Pulse 78   Temp 97.8 F (36.6 C)   SpO2 99%   Physical Exam Gen: NAD, pleasant Abd: soft, perc chole drain in place in the RUQ.  Drain site is c/d/i.  Drain injection shows obstruction still of the cystic duct.  Contrast does not flow into the biliary tree.  A new bag was replaced. Imaging: No results found.  Labs:  CBC:  Recent Labs  10/06/16 1828 10/06/16 2241 10/07/16 0130 10/08/16 0646 10/09/16 0257  WBC 10.4  --  14.4* 14.0* 8.1  HGB 15.1* 16.3* 13.9 11.9* 11.6*  HCT 43.5 48.0* 40.9 35.9* 34.7*  PLT 244  --  252 174 147*    COAGS:  Recent Labs  10/08/16 0859  INR 1.52    BMP:  Recent Labs  10/10/16 0344 10/11/16 0236 10/11/16 1204 10/12/16 0636  NA 134* 141 137 140  K 3.1* 3.7 4.9 4.2  CL 102 107 106 110  CO2 21* 22 19* 22  GLUCOSE 129* 130* 146* 135*  BUN 16 13 14 8   CALCIUM 7.6* 8.0* 7.9* 8.2*  CREATININE 0.96 0.83 0.80 0.79  GFRNONAA 51* >60 >60 >60  GFRAA 59* >60 >60 >60    LIVER FUNCTION TESTS:  Recent Labs  10/10/16 0344 10/11/16 0236 10/11/16 1204 10/12/16 0636  BILITOT 4.0* 2.9* 3.1* 2.3*  AST 65* 45* 62* 35  ALT 82* 67* 61* 51  ALKPHOS 115 105 98 92  PROT 6.8 6.8 6.4* 6.5  ALBUMIN 2.9* 2.9* 2.9* 2.8*    Assessment:  1. Cholecystitis, s/p perc chole drain placement on 10-08-16  Cholangiogram today shows that the cystic duct is not patent.  We are unable to cap her drain as it still appears she has an obstruction of this duct.  We have replaced a new drain.  Her drain was not having measurements and was not being flushed.  I have shown her daughter how to flush her drain and reattach the bag.  We have also given her daughter a chart to document her output.  She was given a prescription for flushes as well.  She is to see Dr. Rosendo Gros on Monday.  If she is a surgical candidate, then the drain will likely be removed at the time of surgery.   However, if she isn't, she will need to return to see [place] so we can set up every 6-8 weeks drain exchanges.  The patient and her daughter understand this and agree with this plan.  Signed: Henreitta Cea 11/16/2016, 1:49 PM   Please refer to Dr. Pasty Arch attestation of this note for management and plan.

## 2016-11-17 DIAGNOSIS — I272 Pulmonary hypertension, unspecified: Secondary | ICD-10-CM | POA: Diagnosis not present

## 2016-11-17 DIAGNOSIS — K810 Acute cholecystitis: Secondary | ICD-10-CM | POA: Diagnosis not present

## 2016-11-17 DIAGNOSIS — I25119 Atherosclerotic heart disease of native coronary artery with unspecified angina pectoris: Secondary | ICD-10-CM | POA: Diagnosis not present

## 2016-11-17 DIAGNOSIS — I442 Atrioventricular block, complete: Secondary | ICD-10-CM | POA: Diagnosis not present

## 2016-11-17 DIAGNOSIS — E119 Type 2 diabetes mellitus without complications: Secondary | ICD-10-CM | POA: Diagnosis not present

## 2016-11-17 DIAGNOSIS — I10 Essential (primary) hypertension: Secondary | ICD-10-CM | POA: Diagnosis not present

## 2016-11-18 DIAGNOSIS — I272 Pulmonary hypertension, unspecified: Secondary | ICD-10-CM | POA: Diagnosis not present

## 2016-11-18 DIAGNOSIS — I442 Atrioventricular block, complete: Secondary | ICD-10-CM | POA: Diagnosis not present

## 2016-11-18 DIAGNOSIS — I25119 Atherosclerotic heart disease of native coronary artery with unspecified angina pectoris: Secondary | ICD-10-CM | POA: Diagnosis not present

## 2016-11-18 DIAGNOSIS — I10 Essential (primary) hypertension: Secondary | ICD-10-CM | POA: Diagnosis not present

## 2016-11-18 DIAGNOSIS — E119 Type 2 diabetes mellitus without complications: Secondary | ICD-10-CM | POA: Diagnosis not present

## 2016-11-18 DIAGNOSIS — K810 Acute cholecystitis: Secondary | ICD-10-CM | POA: Diagnosis not present

## 2016-11-19 DIAGNOSIS — I272 Pulmonary hypertension, unspecified: Secondary | ICD-10-CM | POA: Diagnosis not present

## 2016-11-19 DIAGNOSIS — K810 Acute cholecystitis: Secondary | ICD-10-CM | POA: Diagnosis not present

## 2016-11-19 DIAGNOSIS — I442 Atrioventricular block, complete: Secondary | ICD-10-CM | POA: Diagnosis not present

## 2016-11-19 DIAGNOSIS — I25119 Atherosclerotic heart disease of native coronary artery with unspecified angina pectoris: Secondary | ICD-10-CM | POA: Diagnosis not present

## 2016-11-19 DIAGNOSIS — I10 Essential (primary) hypertension: Secondary | ICD-10-CM | POA: Diagnosis not present

## 2016-11-19 DIAGNOSIS — E119 Type 2 diabetes mellitus without complications: Secondary | ICD-10-CM | POA: Diagnosis not present

## 2016-11-22 DIAGNOSIS — I10 Essential (primary) hypertension: Secondary | ICD-10-CM | POA: Diagnosis not present

## 2016-11-22 DIAGNOSIS — E119 Type 2 diabetes mellitus without complications: Secondary | ICD-10-CM | POA: Diagnosis not present

## 2016-11-22 DIAGNOSIS — I442 Atrioventricular block, complete: Secondary | ICD-10-CM | POA: Diagnosis not present

## 2016-11-22 DIAGNOSIS — I25119 Atherosclerotic heart disease of native coronary artery with unspecified angina pectoris: Secondary | ICD-10-CM | POA: Diagnosis not present

## 2016-11-22 DIAGNOSIS — I272 Pulmonary hypertension, unspecified: Secondary | ICD-10-CM | POA: Diagnosis not present

## 2016-11-22 DIAGNOSIS — K810 Acute cholecystitis: Secondary | ICD-10-CM | POA: Diagnosis not present

## 2016-11-23 DIAGNOSIS — I442 Atrioventricular block, complete: Secondary | ICD-10-CM | POA: Diagnosis not present

## 2016-11-23 DIAGNOSIS — E119 Type 2 diabetes mellitus without complications: Secondary | ICD-10-CM | POA: Diagnosis not present

## 2016-11-23 DIAGNOSIS — I10 Essential (primary) hypertension: Secondary | ICD-10-CM | POA: Diagnosis not present

## 2016-11-23 DIAGNOSIS — K810 Acute cholecystitis: Secondary | ICD-10-CM | POA: Diagnosis not present

## 2016-11-23 DIAGNOSIS — I272 Pulmonary hypertension, unspecified: Secondary | ICD-10-CM | POA: Diagnosis not present

## 2016-11-23 DIAGNOSIS — I25119 Atherosclerotic heart disease of native coronary artery with unspecified angina pectoris: Secondary | ICD-10-CM | POA: Diagnosis not present

## 2016-11-24 DIAGNOSIS — K810 Acute cholecystitis: Secondary | ICD-10-CM | POA: Diagnosis not present

## 2016-11-24 DIAGNOSIS — I442 Atrioventricular block, complete: Secondary | ICD-10-CM | POA: Diagnosis not present

## 2016-11-24 DIAGNOSIS — E119 Type 2 diabetes mellitus without complications: Secondary | ICD-10-CM | POA: Diagnosis not present

## 2016-11-24 DIAGNOSIS — I10 Essential (primary) hypertension: Secondary | ICD-10-CM | POA: Diagnosis not present

## 2016-11-24 DIAGNOSIS — I272 Pulmonary hypertension, unspecified: Secondary | ICD-10-CM | POA: Diagnosis not present

## 2016-11-24 DIAGNOSIS — I25119 Atherosclerotic heart disease of native coronary artery with unspecified angina pectoris: Secondary | ICD-10-CM | POA: Diagnosis not present

## 2016-11-26 DIAGNOSIS — I25119 Atherosclerotic heart disease of native coronary artery with unspecified angina pectoris: Secondary | ICD-10-CM | POA: Diagnosis not present

## 2016-11-26 DIAGNOSIS — I10 Essential (primary) hypertension: Secondary | ICD-10-CM | POA: Diagnosis not present

## 2016-11-26 DIAGNOSIS — K810 Acute cholecystitis: Secondary | ICD-10-CM | POA: Diagnosis not present

## 2016-11-26 DIAGNOSIS — I272 Pulmonary hypertension, unspecified: Secondary | ICD-10-CM | POA: Diagnosis not present

## 2016-11-26 DIAGNOSIS — E119 Type 2 diabetes mellitus without complications: Secondary | ICD-10-CM | POA: Diagnosis not present

## 2016-11-26 DIAGNOSIS — I442 Atrioventricular block, complete: Secondary | ICD-10-CM | POA: Diagnosis not present

## 2016-11-29 DIAGNOSIS — I10 Essential (primary) hypertension: Secondary | ICD-10-CM | POA: Diagnosis not present

## 2016-11-29 DIAGNOSIS — I272 Pulmonary hypertension, unspecified: Secondary | ICD-10-CM | POA: Diagnosis not present

## 2016-11-29 DIAGNOSIS — I25119 Atherosclerotic heart disease of native coronary artery with unspecified angina pectoris: Secondary | ICD-10-CM | POA: Diagnosis not present

## 2016-11-29 DIAGNOSIS — I442 Atrioventricular block, complete: Secondary | ICD-10-CM | POA: Diagnosis not present

## 2016-11-29 DIAGNOSIS — K810 Acute cholecystitis: Secondary | ICD-10-CM | POA: Diagnosis not present

## 2016-11-29 DIAGNOSIS — E119 Type 2 diabetes mellitus without complications: Secondary | ICD-10-CM | POA: Diagnosis not present

## 2016-11-30 DIAGNOSIS — E119 Type 2 diabetes mellitus without complications: Secondary | ICD-10-CM | POA: Diagnosis not present

## 2016-11-30 DIAGNOSIS — I272 Pulmonary hypertension, unspecified: Secondary | ICD-10-CM | POA: Diagnosis not present

## 2016-11-30 DIAGNOSIS — K810 Acute cholecystitis: Secondary | ICD-10-CM | POA: Diagnosis not present

## 2016-11-30 DIAGNOSIS — I25119 Atherosclerotic heart disease of native coronary artery with unspecified angina pectoris: Secondary | ICD-10-CM | POA: Diagnosis not present

## 2016-11-30 DIAGNOSIS — T85518A Breakdown (mechanical) of other gastrointestinal prosthetic devices, implants and grafts, initial encounter: Secondary | ICD-10-CM | POA: Diagnosis not present

## 2016-11-30 DIAGNOSIS — I442 Atrioventricular block, complete: Secondary | ICD-10-CM | POA: Diagnosis not present

## 2016-11-30 DIAGNOSIS — I10 Essential (primary) hypertension: Secondary | ICD-10-CM | POA: Diagnosis not present

## 2016-12-01 ENCOUNTER — Telehealth: Payer: Self-pay | Admitting: Internal Medicine

## 2016-12-01 DIAGNOSIS — K810 Acute cholecystitis: Secondary | ICD-10-CM | POA: Diagnosis not present

## 2016-12-01 DIAGNOSIS — I25119 Atherosclerotic heart disease of native coronary artery with unspecified angina pectoris: Secondary | ICD-10-CM | POA: Diagnosis not present

## 2016-12-01 DIAGNOSIS — E119 Type 2 diabetes mellitus without complications: Secondary | ICD-10-CM | POA: Diagnosis not present

## 2016-12-01 DIAGNOSIS — I10 Essential (primary) hypertension: Secondary | ICD-10-CM | POA: Diagnosis not present

## 2016-12-01 DIAGNOSIS — I272 Pulmonary hypertension, unspecified: Secondary | ICD-10-CM | POA: Diagnosis not present

## 2016-12-01 DIAGNOSIS — I442 Atrioventricular block, complete: Secondary | ICD-10-CM | POA: Diagnosis not present

## 2016-12-01 NOTE — Telephone Encounter (Signed)
Informed Maria [name] at Dr. Johney Frame office that patient is cleared for gallbladder surgery. Per Dr. Lovena Le: Pt cleared for surgery from cardiac standpoint.  Pt is a moderate surgical risk based on advanced age.  She is cleared to stop ASA prior to procedure and resume per surgeon's orders. Will fax this note to Ramirez's office.

## 2016-12-01 NOTE — Telephone Encounter (Signed)
Request for surgical clearance:  What type of surgery is being performed? Gallbladder  1. When is this surgery scheduled? unknown  2. Are there any medications that need to be held prior to surgery and how long? Asprin   Name of physician performing surgery? Dr. Rosendo Gros 3. What is your office phone and fax number?  [phone]  Fax  [phone]

## 2016-12-02 DIAGNOSIS — K810 Acute cholecystitis: Secondary | ICD-10-CM | POA: Diagnosis not present

## 2016-12-02 DIAGNOSIS — E119 Type 2 diabetes mellitus without complications: Secondary | ICD-10-CM | POA: Diagnosis not present

## 2016-12-02 DIAGNOSIS — I442 Atrioventricular block, complete: Secondary | ICD-10-CM | POA: Diagnosis not present

## 2016-12-02 DIAGNOSIS — I25119 Atherosclerotic heart disease of native coronary artery with unspecified angina pectoris: Secondary | ICD-10-CM | POA: Diagnosis not present

## 2016-12-02 DIAGNOSIS — I272 Pulmonary hypertension, unspecified: Secondary | ICD-10-CM | POA: Diagnosis not present

## 2016-12-02 DIAGNOSIS — I10 Essential (primary) hypertension: Secondary | ICD-10-CM | POA: Diagnosis not present

## 2016-12-03 DIAGNOSIS — K810 Acute cholecystitis: Secondary | ICD-10-CM | POA: Diagnosis not present

## 2016-12-03 DIAGNOSIS — I10 Essential (primary) hypertension: Secondary | ICD-10-CM | POA: Diagnosis not present

## 2016-12-03 DIAGNOSIS — I442 Atrioventricular block, complete: Secondary | ICD-10-CM | POA: Diagnosis not present

## 2016-12-03 DIAGNOSIS — E119 Type 2 diabetes mellitus without complications: Secondary | ICD-10-CM | POA: Diagnosis not present

## 2016-12-03 DIAGNOSIS — I272 Pulmonary hypertension, unspecified: Secondary | ICD-10-CM | POA: Diagnosis not present

## 2016-12-03 DIAGNOSIS — I25119 Atherosclerotic heart disease of native coronary artery with unspecified angina pectoris: Secondary | ICD-10-CM | POA: Diagnosis not present

## 2016-12-06 DIAGNOSIS — I25119 Atherosclerotic heart disease of native coronary artery with unspecified angina pectoris: Secondary | ICD-10-CM | POA: Diagnosis not present

## 2016-12-06 DIAGNOSIS — I272 Pulmonary hypertension, unspecified: Secondary | ICD-10-CM | POA: Diagnosis not present

## 2016-12-06 DIAGNOSIS — I442 Atrioventricular block, complete: Secondary | ICD-10-CM | POA: Diagnosis not present

## 2016-12-06 DIAGNOSIS — E119 Type 2 diabetes mellitus without complications: Secondary | ICD-10-CM | POA: Diagnosis not present

## 2016-12-06 DIAGNOSIS — I10 Essential (primary) hypertension: Secondary | ICD-10-CM | POA: Diagnosis not present

## 2016-12-06 DIAGNOSIS — K810 Acute cholecystitis: Secondary | ICD-10-CM | POA: Diagnosis not present

## 2016-12-07 ENCOUNTER — Ambulatory Visit (INDEPENDENT_AMBULATORY_CARE_PROVIDER_SITE_OTHER): Payer: Medicare Other | Admitting: Podiatry

## 2016-12-07 ENCOUNTER — Encounter: Payer: Self-pay | Admitting: Podiatry

## 2016-12-07 VITALS — BP 163/92 | HR 83

## 2016-12-07 DIAGNOSIS — I442 Atrioventricular block, complete: Secondary | ICD-10-CM | POA: Diagnosis not present

## 2016-12-07 DIAGNOSIS — E119 Type 2 diabetes mellitus without complications: Secondary | ICD-10-CM | POA: Diagnosis not present

## 2016-12-07 DIAGNOSIS — M79676 Pain in unspecified toe(s): Secondary | ICD-10-CM | POA: Diagnosis not present

## 2016-12-07 DIAGNOSIS — I272 Pulmonary hypertension, unspecified: Secondary | ICD-10-CM | POA: Diagnosis not present

## 2016-12-07 DIAGNOSIS — B351 Tinea unguium: Secondary | ICD-10-CM

## 2016-12-07 DIAGNOSIS — I25119 Atherosclerotic heart disease of native coronary artery with unspecified angina pectoris: Secondary | ICD-10-CM | POA: Diagnosis not present

## 2016-12-07 DIAGNOSIS — I10 Essential (primary) hypertension: Secondary | ICD-10-CM | POA: Diagnosis not present

## 2016-12-07 DIAGNOSIS — K810 Acute cholecystitis: Secondary | ICD-10-CM | POA: Diagnosis not present

## 2016-12-07 NOTE — Progress Notes (Signed)
   Subjective:    Patient ID: Maria [name], female    DOB: 1924-02-05, 83 y.o.   MRN: [id number]  HPI this patient presents the office with chief complaint of long thick painful nails. He states the nails are painful walking and wearing her shoes. This patient is a diabetic and renal and she requests nail care as well as an evaluation of her diabetic feet.     Review of Systems  Eyes: Positive for itching.  Respiratory: Positive for shortness of breath.   Cardiovascular:       Calf pain with walking  Endocrine: Positive for cold intolerance.  Musculoskeletal: Positive for back pain and gait problem.  Neurological: Positive for light-headedness.       Objective:   Physical Exam GENERAL APPEARANCE: Alert, conversant. Appropriately groomed. No acute distress.  VASCULAR: Pedal pulses are  Weakly  palpable at  [facility] and PT bilateral.  Capillary refill time is immediate to all digits,  Normal temperature gradient.   NEUROLOGIC: sensation is absent  to 5.07 monofilament at 5/5 sites bilateral.  Light touch is intact bilateral, Muscle strength normal.  MUSCULOSKELETAL: acceptable muscle strength, tone and stability bilateral.  Intrinsic muscluature intact bilateral.  Hallux malleus right hallux.  Hammer toes 2-5  B/L.   DERMATOLOGIC: skin color, texture, and turgor are within normal limits.  No preulcerative lesions or ulcers  are seen, no interdigital maceration noted.  No open lesions present.  No drainage noted.  NAILS  Thick disfigured discolored nails both feet.         Assessment & Plan:  Onychomycosis  B/L  Diabetes with neurology.  IE  Debride nails.  RTC 3 months.  Patient is eligible for diabetic shoes.  We will discuss this at a later date.   Gardiner Barefoot DPM

## 2016-12-08 DIAGNOSIS — I25119 Atherosclerotic heart disease of native coronary artery with unspecified angina pectoris: Secondary | ICD-10-CM | POA: Diagnosis not present

## 2016-12-08 DIAGNOSIS — I10 Essential (primary) hypertension: Secondary | ICD-10-CM | POA: Diagnosis not present

## 2016-12-08 DIAGNOSIS — I272 Pulmonary hypertension, unspecified: Secondary | ICD-10-CM | POA: Diagnosis not present

## 2016-12-08 DIAGNOSIS — K810 Acute cholecystitis: Secondary | ICD-10-CM | POA: Diagnosis not present

## 2016-12-08 DIAGNOSIS — I442 Atrioventricular block, complete: Secondary | ICD-10-CM | POA: Diagnosis not present

## 2016-12-08 DIAGNOSIS — E119 Type 2 diabetes mellitus without complications: Secondary | ICD-10-CM | POA: Diagnosis not present

## 2016-12-10 DIAGNOSIS — I10 Essential (primary) hypertension: Secondary | ICD-10-CM | POA: Diagnosis not present

## 2016-12-10 DIAGNOSIS — E119 Type 2 diabetes mellitus without complications: Secondary | ICD-10-CM | POA: Diagnosis not present

## 2016-12-10 DIAGNOSIS — K810 Acute cholecystitis: Secondary | ICD-10-CM | POA: Diagnosis not present

## 2016-12-10 DIAGNOSIS — I25119 Atherosclerotic heart disease of native coronary artery with unspecified angina pectoris: Secondary | ICD-10-CM | POA: Diagnosis not present

## 2016-12-10 DIAGNOSIS — I442 Atrioventricular block, complete: Secondary | ICD-10-CM | POA: Diagnosis not present

## 2016-12-10 DIAGNOSIS — I272 Pulmonary hypertension, unspecified: Secondary | ICD-10-CM | POA: Diagnosis not present

## 2016-12-10 NOTE — Progress Notes (Signed)
Electrophysiology Office Note Date: 12/13/2016  ID:  Maria [name], DOB Dec 02, 1923, MRN [id number]  PCP: Donnie Coffin, MD Electrophysiologist: Lovena Le  CC: discuss possible cholecystectomy   Maria [name] is a 83 y.o. female seen today for Dr Lovena Le.  She was recently cleared by phone by Dr Lovena Le for cholecystectomy but comes in today to further discuss.  Since last being seen in our clinic, the patient reports doing relatively well.  She is seen with her daughter today who reports that she is not very active at home. She is participating in physical therapy without chest pain or shortness of breath but does have arthritis that limits her mobility.  She denies chest pain, palpitations, dyspnea, PND, orthopnea, nausea, vomiting, dizziness, syncope, edema, weight gain, or early satiety.  Echo 09/2016 demonstrated EF 50-55%, mild LVH, grade 1 diastolic dysfunction, mild MR, moderate to severe TR, PA pressure 81  Device History: MDT dual chamber PPM implanted 2003 for complete heart block; gen change 2010   Past Medical History:  Diagnosis Date  . Anxiety   . Arthritis   . Cancer ([place])    breast 2015  . Cardiac pacemaker in situ   . Chest pain   . Diabetes mellitus without complication ([place])   . DM type 2 (diabetes mellitus, type 2) ([place])   . Dyslipidemia   . Hypertension   . Type II or unspecified type diabetes mellitus without mention of complication, not stated as uncontrolled    Past Surgical History:  Procedure Laterality Date  . CATARACT EXTRACTION    . IR GENERIC HISTORICAL  10/08/2016   IR PERC CHOLECYSTOSTOMY 10/08/2016 Greggory Keen, MD [facility]  . KNEE SURGERY Bilateral   . PACEMAKER INSERTION     [employer], [facility] 2003  . PACEMAKER INSERTION    . TOTAL KNEE ARTHROPLASTY Bilateral     Current Outpatient Prescriptions  Medication Sig Dispense Refill  . acetaminophen (TYLENOL) 500 MG tablet Take 500 mg by mouth every 6 (six) hours as needed for mild pain.     Marland Kitchen aspirin EC 81 MG tablet Take 81 mg by mouth daily.    . Blood Glucose Monitoring Suppl (FREESTYLE LITE) DEVI   0  . Calcium-Magnesium-Vitamin D [id number] MG-MG-UNIT TABS Take 1 tablet by mouth daily.    Marland Kitchen FREESTYLE LITE test strip   4  . Lancets (FREESTYLE) lancets   4  . losartan-hydrochlorothiazide (HYZAAR) 50-12.5 MG per tablet Take 1 tablet by mouth daily.    . metFORMIN (GLUCOPHAGE) 500 MG tablet Take 1 tablet by mouth 2 (two) times daily.    . Multiple Vitamin (MULTIVITAMIN WITH MINERALS) TABS tablet Take 1 tablet by mouth daily.    Marland Kitchen OVER THE COUNTER MEDICATION Place 2 drops into both eyes daily as needed (for dry eyes).    . potassium chloride SA (K-DUR,KLOR-CON) 20 MEQ tablet Take 20 mEq by mouth daily.    . sodium chloride flush 0.9 % SOLN injection   1  . valACYclovir (VALTREX) 1000 MG tablet   0   No current facility-administered medications for this visit.     Allergies:   Patient has no known allergies.   Social History: Social History   Social History  . Marital status: Widowed    Spouse name: N/A  . Number of children: 2  . Years of education: N/A   Occupational History  . Not on file.   Social History Main Topics  . Smoking status: Never Smoker  . Smokeless  tobacco: Never Used  . Alcohol use No  . Drug use: No  . Sexual activity: Not on file   Other Topics Concern  . Not on file   Social History Narrative   ** Merged History Encounter **        Family History: Family History  Problem Relation Age of Onset  . Colon cancer Neg Hx      Review of Systems: All other systems reviewed and are otherwise negative except as noted above.   Physical Exam: VS:  BP 122/76   Pulse 83   Ht 5\' 3"  (1.6 m)   Wt 153 lb 8 oz (69.6 kg)   SpO2 97%   BMI 27.19 kg/m  , BMI Body mass index is 27.19 kg/m.  GEN- The patient is elderly appearing, alert and oriented x 3 today.   HEENT: normocephalic, atraumatic; sclera clear, conjunctiva pink; hearing  intact; oropharynx clear; neck supple  Lungs- Clear to ausculation bilaterally, normal work of breathing.  No wheezes, rales, rhonchi Heart- Regular rate and rhythm (paced) GI- soft, non-tender, non-distended, bowel sounds present  Extremities- no clubbing, cyanosis, or edema  MS- no significant deformity or atrophy Skin- warm and dry, no rash or lesion; PPM pocket well healed Psych- euthymic mood, full affect Neuro- strength and sensation are intact  PPM Interrogation- reviewed in detail today,  See PACEART report  EKG:  EKG is not ordered today. The ekg ordered today shows AV pacing at 83bpm  Recent Labs: 10/09/2016: Hemoglobin 11.6; Platelets 147 10/12/2016: ALT 51; BUN 8; Creatinine, Ser 0.79; Potassium 4.2; Sodium 140   Wt Readings from Last 3 Encounters:  12/13/16 153 lb 8 oz (69.6 kg)  10/12/16 155 lb 8 oz (70.5 kg)  06/16/16 156 lb (70.8 kg)     Other studies Reviewed: Additional studies/ records that were reviewed today include: Dr Tanna Furry office notes  Assessment and Plan:  1.  Complete heart block  Normal PPM function See Pace Art report No changes today  2.  HTN Stable No change required today  3.  Surgical clearance Dr Lovena Le has cleared by phone for surgery The patient is at moderate risk for cholecystectomy with advanced age There is no further cardiac testing that can be done to reduce risks Discussed with family today   Current medicines are reviewed at length with the patient today.   The patient does not have concerns regarding her medicines.  The following changes were made today:  none  Labs/ tests ordered today include:  Orders Placed This Encounter  Procedures  . EKG 12-Lead     Disposition:   Follow up with [facility], Dr Lovena Le 1 year    Signed, Chanetta Marshall, NP 12/13/2016 2:25 PM  [address] [place] [address] [phone] (office) [phone] (fax)

## 2016-12-13 ENCOUNTER — Other Ambulatory Visit: Payer: Self-pay | Admitting: Family Medicine

## 2016-12-13 ENCOUNTER — Encounter: Payer: Self-pay | Admitting: Nurse Practitioner

## 2016-12-13 ENCOUNTER — Ambulatory Visit (INDEPENDENT_AMBULATORY_CARE_PROVIDER_SITE_OTHER): Payer: Medicare Other | Admitting: Nurse Practitioner

## 2016-12-13 ENCOUNTER — Encounter (INDEPENDENT_AMBULATORY_CARE_PROVIDER_SITE_OTHER): Payer: Self-pay

## 2016-12-13 VITALS — BP 122/76 | HR 83 | Ht 63.0 in | Wt 153.5 lb

## 2016-12-13 DIAGNOSIS — I10 Essential (primary) hypertension: Secondary | ICD-10-CM | POA: Diagnosis not present

## 2016-12-13 DIAGNOSIS — R000 Tachycardia, unspecified: Secondary | ICD-10-CM | POA: Diagnosis not present

## 2016-12-13 DIAGNOSIS — I442 Atrioventricular block, complete: Secondary | ICD-10-CM | POA: Diagnosis not present

## 2016-12-13 DIAGNOSIS — Z01818 Encounter for other preprocedural examination: Secondary | ICD-10-CM | POA: Diagnosis not present

## 2016-12-13 DIAGNOSIS — I272 Pulmonary hypertension, unspecified: Secondary | ICD-10-CM | POA: Diagnosis not present

## 2016-12-13 DIAGNOSIS — I25119 Atherosclerotic heart disease of native coronary artery with unspecified angina pectoris: Secondary | ICD-10-CM | POA: Diagnosis not present

## 2016-12-13 DIAGNOSIS — K810 Acute cholecystitis: Secondary | ICD-10-CM | POA: Diagnosis not present

## 2016-12-13 DIAGNOSIS — Z1231 Encounter for screening mammogram for malignant neoplasm of breast: Secondary | ICD-10-CM

## 2016-12-13 DIAGNOSIS — E119 Type 2 diabetes mellitus without complications: Secondary | ICD-10-CM | POA: Diagnosis not present

## 2016-12-13 LAB — CUP PACEART INCLINIC DEVICE CHECK
Date Time Interrogation Session: 20180402143846
Implantable Lead Implant Date: 20030311
Implantable Lead Location: 753859
Implantable Lead Location: 753860
MDC IDC LEAD IMPLANT DT: 20030311
MDC IDC PG IMPLANT DT: 20100120

## 2016-12-13 NOTE — Patient Instructions (Signed)
Medication Instructions:  None Ordered   Labwork: None Ordered   Testing/Procedures: None Ordered   Follow-Up: Remote monitoring is used to monitor your Pacemaker of from home. This monitoring reduces the number of office visits required to check your device to one time per year. It allows [place] to keep an eye on the functioning of your device to ensure it is working properly. You are scheduled for a device check from home on 03/14/2017. You may send your transmission at any time that day. If you have a wireless device, the transmission will be sent automatically. After your physician reviews your transmission, you will receive a postcard with your next transmission date.  Your physician wants you to follow-up in: 1 year with Dr. Lovena Le. You will receive a reminder letter in the mail two months in advance. If you don't receive a letter, please call our office to schedule the follow-up appointment.   Any Other Special Instructions Will Be Listed Below (If Applicable).     If you need a refill on your cardiac medications before your next appointment, please call your pharmacy.

## 2016-12-14 DIAGNOSIS — I442 Atrioventricular block, complete: Secondary | ICD-10-CM | POA: Diagnosis not present

## 2016-12-14 DIAGNOSIS — I10 Essential (primary) hypertension: Secondary | ICD-10-CM | POA: Diagnosis not present

## 2016-12-14 DIAGNOSIS — K810 Acute cholecystitis: Secondary | ICD-10-CM | POA: Diagnosis not present

## 2016-12-14 DIAGNOSIS — I272 Pulmonary hypertension, unspecified: Secondary | ICD-10-CM | POA: Diagnosis not present

## 2016-12-14 DIAGNOSIS — I25119 Atherosclerotic heart disease of native coronary artery with unspecified angina pectoris: Secondary | ICD-10-CM | POA: Diagnosis not present

## 2016-12-14 DIAGNOSIS — E119 Type 2 diabetes mellitus without complications: Secondary | ICD-10-CM | POA: Diagnosis not present

## 2016-12-15 DIAGNOSIS — I272 Pulmonary hypertension, unspecified: Secondary | ICD-10-CM | POA: Diagnosis not present

## 2016-12-15 DIAGNOSIS — K810 Acute cholecystitis: Secondary | ICD-10-CM | POA: Diagnosis not present

## 2016-12-15 DIAGNOSIS — I10 Essential (primary) hypertension: Secondary | ICD-10-CM | POA: Diagnosis not present

## 2016-12-15 DIAGNOSIS — E119 Type 2 diabetes mellitus without complications: Secondary | ICD-10-CM | POA: Diagnosis not present

## 2016-12-15 DIAGNOSIS — I442 Atrioventricular block, complete: Secondary | ICD-10-CM | POA: Diagnosis not present

## 2016-12-15 DIAGNOSIS — I25119 Atherosclerotic heart disease of native coronary artery with unspecified angina pectoris: Secondary | ICD-10-CM | POA: Diagnosis not present

## 2016-12-16 DIAGNOSIS — I272 Pulmonary hypertension, unspecified: Secondary | ICD-10-CM | POA: Diagnosis not present

## 2016-12-16 DIAGNOSIS — K810 Acute cholecystitis: Secondary | ICD-10-CM | POA: Diagnosis not present

## 2016-12-16 DIAGNOSIS — I10 Essential (primary) hypertension: Secondary | ICD-10-CM | POA: Diagnosis not present

## 2016-12-16 DIAGNOSIS — E119 Type 2 diabetes mellitus without complications: Secondary | ICD-10-CM | POA: Diagnosis not present

## 2016-12-16 DIAGNOSIS — I442 Atrioventricular block, complete: Secondary | ICD-10-CM | POA: Diagnosis not present

## 2016-12-16 DIAGNOSIS — I25119 Atherosclerotic heart disease of native coronary artery with unspecified angina pectoris: Secondary | ICD-10-CM | POA: Diagnosis not present

## 2016-12-21 DIAGNOSIS — I442 Atrioventricular block, complete: Secondary | ICD-10-CM | POA: Diagnosis not present

## 2016-12-21 DIAGNOSIS — I272 Pulmonary hypertension, unspecified: Secondary | ICD-10-CM | POA: Diagnosis not present

## 2016-12-21 DIAGNOSIS — I25119 Atherosclerotic heart disease of native coronary artery with unspecified angina pectoris: Secondary | ICD-10-CM | POA: Diagnosis not present

## 2016-12-21 DIAGNOSIS — K810 Acute cholecystitis: Secondary | ICD-10-CM | POA: Diagnosis not present

## 2016-12-21 DIAGNOSIS — I10 Essential (primary) hypertension: Secondary | ICD-10-CM | POA: Diagnosis not present

## 2016-12-21 DIAGNOSIS — E119 Type 2 diabetes mellitus without complications: Secondary | ICD-10-CM | POA: Diagnosis not present

## 2016-12-22 DIAGNOSIS — I442 Atrioventricular block, complete: Secondary | ICD-10-CM | POA: Diagnosis not present

## 2016-12-22 DIAGNOSIS — E119 Type 2 diabetes mellitus without complications: Secondary | ICD-10-CM | POA: Diagnosis not present

## 2016-12-22 DIAGNOSIS — I272 Pulmonary hypertension, unspecified: Secondary | ICD-10-CM | POA: Diagnosis not present

## 2016-12-22 DIAGNOSIS — I10 Essential (primary) hypertension: Secondary | ICD-10-CM | POA: Diagnosis not present

## 2016-12-22 DIAGNOSIS — K810 Acute cholecystitis: Secondary | ICD-10-CM | POA: Diagnosis not present

## 2016-12-22 DIAGNOSIS — I25119 Atherosclerotic heart disease of native coronary artery with unspecified angina pectoris: Secondary | ICD-10-CM | POA: Diagnosis not present

## 2016-12-23 DIAGNOSIS — E119 Type 2 diabetes mellitus without complications: Secondary | ICD-10-CM | POA: Diagnosis not present

## 2016-12-23 DIAGNOSIS — Z7984 Long term (current) use of oral hypoglycemic drugs: Secondary | ICD-10-CM | POA: Diagnosis not present

## 2016-12-23 DIAGNOSIS — I10 Essential (primary) hypertension: Secondary | ICD-10-CM | POA: Diagnosis not present

## 2016-12-23 DIAGNOSIS — M79606 Pain in leg, unspecified: Secondary | ICD-10-CM | POA: Diagnosis not present

## 2016-12-24 DIAGNOSIS — I25119 Atherosclerotic heart disease of native coronary artery with unspecified angina pectoris: Secondary | ICD-10-CM | POA: Diagnosis not present

## 2016-12-24 DIAGNOSIS — I272 Pulmonary hypertension, unspecified: Secondary | ICD-10-CM | POA: Diagnosis not present

## 2016-12-24 DIAGNOSIS — I10 Essential (primary) hypertension: Secondary | ICD-10-CM | POA: Diagnosis not present

## 2016-12-24 DIAGNOSIS — K810 Acute cholecystitis: Secondary | ICD-10-CM | POA: Diagnosis not present

## 2016-12-24 DIAGNOSIS — I442 Atrioventricular block, complete: Secondary | ICD-10-CM | POA: Diagnosis not present

## 2016-12-24 DIAGNOSIS — E119 Type 2 diabetes mellitus without complications: Secondary | ICD-10-CM | POA: Diagnosis not present

## 2016-12-27 DIAGNOSIS — H31091 Other chorioretinal scars, right eye: Secondary | ICD-10-CM | POA: Diagnosis not present

## 2016-12-27 DIAGNOSIS — H40003 Preglaucoma, unspecified, bilateral: Secondary | ICD-10-CM | POA: Diagnosis not present

## 2016-12-27 DIAGNOSIS — K810 Acute cholecystitis: Secondary | ICD-10-CM | POA: Diagnosis not present

## 2016-12-27 DIAGNOSIS — H11153 Pinguecula, bilateral: Secondary | ICD-10-CM | POA: Diagnosis not present

## 2016-12-27 DIAGNOSIS — H40013 Open angle with borderline findings, low risk, bilateral: Secondary | ICD-10-CM | POA: Diagnosis not present

## 2016-12-27 DIAGNOSIS — I25119 Atherosclerotic heart disease of native coronary artery with unspecified angina pectoris: Secondary | ICD-10-CM | POA: Diagnosis not present

## 2016-12-27 DIAGNOSIS — H52223 Regular astigmatism, bilateral: Secondary | ICD-10-CM | POA: Diagnosis not present

## 2016-12-27 DIAGNOSIS — H5201 Hypermetropia, right eye: Secondary | ICD-10-CM | POA: Diagnosis not present

## 2016-12-27 DIAGNOSIS — H353131 Nonexudative age-related macular degeneration, bilateral, early dry stage: Secondary | ICD-10-CM | POA: Diagnosis not present

## 2016-12-27 DIAGNOSIS — H11423 Conjunctival edema, bilateral: Secondary | ICD-10-CM | POA: Diagnosis not present

## 2016-12-27 DIAGNOSIS — D3132 Benign neoplasm of left choroid: Secondary | ICD-10-CM | POA: Diagnosis not present

## 2016-12-27 DIAGNOSIS — I272 Pulmonary hypertension, unspecified: Secondary | ICD-10-CM | POA: Diagnosis not present

## 2016-12-27 DIAGNOSIS — H18413 Arcus senilis, bilateral: Secondary | ICD-10-CM | POA: Diagnosis not present

## 2016-12-27 DIAGNOSIS — I10 Essential (primary) hypertension: Secondary | ICD-10-CM | POA: Diagnosis not present

## 2016-12-27 DIAGNOSIS — Z961 Presence of intraocular lens: Secondary | ICD-10-CM | POA: Diagnosis not present

## 2016-12-27 DIAGNOSIS — E119 Type 2 diabetes mellitus without complications: Secondary | ICD-10-CM | POA: Diagnosis not present

## 2016-12-27 DIAGNOSIS — Z9849 Cataract extraction status, unspecified eye: Secondary | ICD-10-CM | POA: Diagnosis not present

## 2016-12-27 DIAGNOSIS — I442 Atrioventricular block, complete: Secondary | ICD-10-CM | POA: Diagnosis not present

## 2016-12-28 ENCOUNTER — Ambulatory Visit: Payer: Self-pay | Admitting: General Surgery

## 2016-12-28 DIAGNOSIS — E119 Type 2 diabetes mellitus without complications: Secondary | ICD-10-CM | POA: Diagnosis not present

## 2016-12-28 DIAGNOSIS — K810 Acute cholecystitis: Secondary | ICD-10-CM | POA: Diagnosis not present

## 2016-12-28 DIAGNOSIS — T85518A Breakdown (mechanical) of other gastrointestinal prosthetic devices, implants and grafts, initial encounter: Secondary | ICD-10-CM | POA: Diagnosis not present

## 2016-12-28 DIAGNOSIS — I10 Essential (primary) hypertension: Secondary | ICD-10-CM | POA: Diagnosis not present

## 2016-12-28 DIAGNOSIS — I272 Pulmonary hypertension, unspecified: Secondary | ICD-10-CM | POA: Diagnosis not present

## 2016-12-28 DIAGNOSIS — I25119 Atherosclerotic heart disease of native coronary artery with unspecified angina pectoris: Secondary | ICD-10-CM | POA: Diagnosis not present

## 2016-12-28 DIAGNOSIS — I442 Atrioventricular block, complete: Secondary | ICD-10-CM | POA: Diagnosis not present

## 2016-12-28 NOTE — H&P (Signed)
History of Present Illness Maria Ok MD; 12/28/2016 3:38 PM) The patient is a 83 year old female who presents with a complaint of cholecystostomy tube. Linear female who we presents after having cardiac evaluation. Patient is seeing Dr. Lovena Le prescription has been cleared for surgery from a cardiac standpoint without any further workup. Patient does have a pacemaker. Patient has been cleared to stop her aspirin prior to surgery. Patient continues to drain her cholecystostomy bag, continue flushes. She's had no further rectal quadrant pain, fever, chills, nausea, vomiting. All other reviews systems are negative.  --------------------------------------- Patient is a 83 year old female who was seen in the hospital in January 2018 for an evaluation of cholecystitis. Secondary to patient's cardiovascular issues is determined at that time the patient would likely benefit from a cholecystostomy tube. This was placed. Patient underwent cholecystostomy tube was left in place.  Patient otherwise states she's had no pain. She has had no issues with eating. No nausea no vomiting.   Allergies Malachy Moan, [place]; 12/28/2016 2:57 PM) No Known Allergies 11/30/2016  Medication History Malachy Moan, [place]; 12/28/2016 2:58 PM) PreserVision AREDS 2+Multi Vit (Oral) Active. Losartan Potassium-HCTZ (50-12.5MG  Tablet, Oral) Active. MetFORMIN HCl (500MG  Tablet, Oral) Active. Sodium Chloride Flush (0.9% Solution, Intravenous) Active. ValACYclovir HCl (1GM Tablet, Oral) Active. Acetaminophen (500MG  Tablet, Oral) Active. Aspirin (81MG  Tablet DR, Oral) Active. Calcium-Magnesium-Vitamin D (500-250-200MG -MG-UNIT Tablet, Oral) Active. Medications Reconciled    Review of Systems Maria Ok, MD; 12/28/2016 3:39 PM) General Present- Weight Loss. Not Present- Appetite Loss, Chills, Fatigue, Fever, Night Sweats and Weight Gain. Skin Not Present- Change in Wart/Mole, Dryness, Hives,  Jaundice, New Lesions, Non-Healing Wounds, Rash and Ulcer. HEENT Present- Wears glasses/contact lenses. Not Present- Earache, Hearing Loss, Hoarseness, Nose Bleed, Oral Ulcers, Ringing in the Ears, Seasonal Allergies, Sinus Pain, Sore Throat, Visual Disturbances and Yellow Eyes. Respiratory Not Present- Bloody sputum, Chronic Cough, Difficulty Breathing, Snoring and Wheezing. Cardiovascular Present- Leg Cramps and Shortness of Breath. Not Present- Chest Pain, Difficulty Breathing Lying Down, Palpitations, Rapid Heart Rate and Swelling of Extremities. Gastrointestinal Present- Abdominal Pain and Constipation. Not Present- Bloating, Bloody Stool, Change in Bowel Habits, Chronic diarrhea, Difficulty Swallowing, Excessive gas, Gets full quickly at meals, Hemorrhoids, Indigestion, Nausea, Rectal Pain and Vomiting. Female Genitourinary Present- Pelvic Pain. Not Present- Frequency, Nocturia, Painful Urination and Urgency. Musculoskeletal Present- Back Pain, Joint Pain, Joint Stiffness and Muscle Pain. Not Present- Muscle Weakness and Swelling of Extremities. Neurological Present- Decreased Memory, Tremor, Trouble walking and Weakness. Not Present- Fainting, Headaches, Numbness, Seizures and Tingling. Psychiatric Present- Anxiety and Depression. Not Present- Bipolar, Change in Sleep Pattern, Fearful and Frequent crying. Endocrine Present- Hot flashes. Not Present- Cold Intolerance, Excessive Hunger, Hair Changes, Heat Intolerance and New Diabetes.  Vitals Malachy Moan RMA; 12/28/2016 2:58 PM) 12/28/2016 2:58 PM Weight: 152.2 lb Height: 63in Body Surface Area: 1.72 m Body Mass Index: 26.96 kg/m  Temp.: 97.39F  Pulse: 77 (Regular)  BP: 124/70 (Sitting, Left Arm, Standard)       Physical Exam Maria Ok, MD; 12/28/2016 3:39 PM) General Mental Status-Alert. General Appearance-Consistent with stated age. Hydration-Well hydrated. Voice-Normal.  Head and  Neck Head-normocephalic, atraumatic with no lesions or palpable masses. Trachea-midline. Thyroid Gland Characteristics - normal size and consistency.  Chest and Lung Exam Chest and lung exam reveals -quiet, even and easy respiratory effort with no use of accessory muscles and on auscultation, normal breath sounds, no adventitious sounds and normal vocal resonance. Inspection Chest Wall - Normal. Back - normal.  Cardiovascular Cardiovascular examination reveals -normal  heart sounds, regular rate and rhythm with no murmurs and normal pedal pulses bilaterally.  Abdomen Note: Cholecystostomy tube drain in place, bilious output   Neurologic Neurologic evaluation reveals -alert and oriented x 3 with no impairment of recent or remote memory. Mental Status-Normal.  Musculoskeletal Normal Exam - Left-Upper Extremity Strength Normal and Lower Extremity Strength Normal. Normal Exam - Right-Upper Extremity Strength Normal and Lower Extremity Strength Normal.    Assessment & Plan Maria Ok MD; 12/28/2016 3:39 PM) CHOLECYSTOSTOMY TUBE DYSFUNCTION, INITIAL ENCOUNTER (T85.518A) Impression: 83 year old female with cholecystostomy tube in place. Patient has multiple comorbidities.  1. Patient has been cleared by Dr. Lovena Le. Patient does have a pacemaker in place will likely require pacemaker rep present on his surgery to interrogate the pacemaker. 2. We'll proceed to the operative for laparoscopic cholecystectomy. 3. Risks and benefits were discussed with the patient to generally include, but not limited to: infection, bleeding, possible need for post op ERCP, damage to the bile ducts, bile leak, and possible need for further surgery. Alternatives were offered and described. All questions were answered and the patient voiced understanding of the procedure and wishes to proceed at this point with a laparoscopic cholecystectomy

## 2016-12-29 DIAGNOSIS — I10 Essential (primary) hypertension: Secondary | ICD-10-CM | POA: Diagnosis not present

## 2016-12-29 DIAGNOSIS — E119 Type 2 diabetes mellitus without complications: Secondary | ICD-10-CM | POA: Diagnosis not present

## 2016-12-29 DIAGNOSIS — I25119 Atherosclerotic heart disease of native coronary artery with unspecified angina pectoris: Secondary | ICD-10-CM | POA: Diagnosis not present

## 2016-12-29 DIAGNOSIS — I272 Pulmonary hypertension, unspecified: Secondary | ICD-10-CM | POA: Diagnosis not present

## 2016-12-29 DIAGNOSIS — K810 Acute cholecystitis: Secondary | ICD-10-CM | POA: Diagnosis not present

## 2016-12-29 DIAGNOSIS — I442 Atrioventricular block, complete: Secondary | ICD-10-CM | POA: Diagnosis not present

## 2016-12-29 MED FILL — MONOJECT 0.9% NA CL SYRING: 0.9 | 20 days supply | Qty: 200 | Fill #1

## 2016-12-31 DIAGNOSIS — I10 Essential (primary) hypertension: Secondary | ICD-10-CM | POA: Diagnosis not present

## 2016-12-31 DIAGNOSIS — I272 Pulmonary hypertension, unspecified: Secondary | ICD-10-CM | POA: Diagnosis not present

## 2016-12-31 DIAGNOSIS — I25119 Atherosclerotic heart disease of native coronary artery with unspecified angina pectoris: Secondary | ICD-10-CM | POA: Diagnosis not present

## 2016-12-31 DIAGNOSIS — E119 Type 2 diabetes mellitus without complications: Secondary | ICD-10-CM | POA: Diagnosis not present

## 2016-12-31 DIAGNOSIS — I442 Atrioventricular block, complete: Secondary | ICD-10-CM | POA: Diagnosis not present

## 2016-12-31 DIAGNOSIS — K810 Acute cholecystitis: Secondary | ICD-10-CM | POA: Diagnosis not present

## 2017-01-02 DIAGNOSIS — I442 Atrioventricular block, complete: Secondary | ICD-10-CM | POA: Diagnosis not present

## 2017-01-02 DIAGNOSIS — E119 Type 2 diabetes mellitus without complications: Secondary | ICD-10-CM | POA: Diagnosis not present

## 2017-01-02 DIAGNOSIS — K810 Acute cholecystitis: Secondary | ICD-10-CM | POA: Diagnosis not present

## 2017-01-02 DIAGNOSIS — I25119 Atherosclerotic heart disease of native coronary artery with unspecified angina pectoris: Secondary | ICD-10-CM | POA: Diagnosis not present

## 2017-01-02 DIAGNOSIS — I272 Pulmonary hypertension, unspecified: Secondary | ICD-10-CM | POA: Diagnosis not present

## 2017-01-02 DIAGNOSIS — I10 Essential (primary) hypertension: Secondary | ICD-10-CM | POA: Diagnosis not present

## 2017-01-03 ENCOUNTER — Ambulatory Visit
Admission: RE | Admit: 2017-01-03 | Discharge: 2017-01-03 | Disposition: A | Discharge status: Home / Self Care | Payer: Medicare Other | Source: Ambulatory Visit | Attending: Family Medicine | Admitting: Family Medicine

## 2017-01-03 ENCOUNTER — Other Ambulatory Visit: Payer: Self-pay | Admitting: Family Medicine

## 2017-01-03 DIAGNOSIS — Z1231 Encounter for screening mammogram for malignant neoplasm of breast: Secondary | ICD-10-CM

## 2017-01-04 DIAGNOSIS — I10 Essential (primary) hypertension: Secondary | ICD-10-CM | POA: Diagnosis not present

## 2017-01-04 DIAGNOSIS — I25119 Atherosclerotic heart disease of native coronary artery with unspecified angina pectoris: Secondary | ICD-10-CM | POA: Diagnosis not present

## 2017-01-04 DIAGNOSIS — I272 Pulmonary hypertension, unspecified: Secondary | ICD-10-CM | POA: Diagnosis not present

## 2017-01-04 DIAGNOSIS — E119 Type 2 diabetes mellitus without complications: Secondary | ICD-10-CM | POA: Diagnosis not present

## 2017-01-04 DIAGNOSIS — K810 Acute cholecystitis: Secondary | ICD-10-CM | POA: Diagnosis not present

## 2017-01-04 DIAGNOSIS — I442 Atrioventricular block, complete: Secondary | ICD-10-CM | POA: Diagnosis not present

## 2017-01-05 DIAGNOSIS — K810 Acute cholecystitis: Secondary | ICD-10-CM | POA: Diagnosis not present

## 2017-01-05 DIAGNOSIS — I25119 Atherosclerotic heart disease of native coronary artery with unspecified angina pectoris: Secondary | ICD-10-CM | POA: Diagnosis not present

## 2017-01-05 DIAGNOSIS — I272 Pulmonary hypertension, unspecified: Secondary | ICD-10-CM | POA: Diagnosis not present

## 2017-01-05 DIAGNOSIS — E119 Type 2 diabetes mellitus without complications: Secondary | ICD-10-CM | POA: Diagnosis not present

## 2017-01-05 DIAGNOSIS — I10 Essential (primary) hypertension: Secondary | ICD-10-CM | POA: Diagnosis not present

## 2017-01-05 DIAGNOSIS — I442 Atrioventricular block, complete: Secondary | ICD-10-CM | POA: Diagnosis not present

## 2017-01-10 DIAGNOSIS — K810 Acute cholecystitis: Secondary | ICD-10-CM | POA: Diagnosis not present

## 2017-01-10 DIAGNOSIS — I10 Essential (primary) hypertension: Secondary | ICD-10-CM | POA: Diagnosis not present

## 2017-01-10 DIAGNOSIS — E119 Type 2 diabetes mellitus without complications: Secondary | ICD-10-CM | POA: Diagnosis not present

## 2017-01-10 DIAGNOSIS — I272 Pulmonary hypertension, unspecified: Secondary | ICD-10-CM | POA: Diagnosis not present

## 2017-01-10 DIAGNOSIS — I442 Atrioventricular block, complete: Secondary | ICD-10-CM | POA: Diagnosis not present

## 2017-01-10 DIAGNOSIS — I25119 Atherosclerotic heart disease of native coronary artery with unspecified angina pectoris: Secondary | ICD-10-CM | POA: Diagnosis not present

## 2017-01-11 DIAGNOSIS — E119 Type 2 diabetes mellitus without complications: Secondary | ICD-10-CM | POA: Diagnosis not present

## 2017-01-11 DIAGNOSIS — I25119 Atherosclerotic heart disease of native coronary artery with unspecified angina pectoris: Secondary | ICD-10-CM | POA: Diagnosis not present

## 2017-01-11 DIAGNOSIS — I272 Pulmonary hypertension, unspecified: Secondary | ICD-10-CM | POA: Diagnosis not present

## 2017-01-11 DIAGNOSIS — I10 Essential (primary) hypertension: Secondary | ICD-10-CM | POA: Diagnosis not present

## 2017-01-11 DIAGNOSIS — I442 Atrioventricular block, complete: Secondary | ICD-10-CM | POA: Diagnosis not present

## 2017-01-11 DIAGNOSIS — K810 Acute cholecystitis: Secondary | ICD-10-CM | POA: Diagnosis not present

## 2017-01-12 DIAGNOSIS — I25119 Atherosclerotic heart disease of native coronary artery with unspecified angina pectoris: Secondary | ICD-10-CM | POA: Diagnosis not present

## 2017-01-12 DIAGNOSIS — E119 Type 2 diabetes mellitus without complications: Secondary | ICD-10-CM | POA: Diagnosis not present

## 2017-01-12 DIAGNOSIS — K810 Acute cholecystitis: Secondary | ICD-10-CM | POA: Diagnosis not present

## 2017-01-12 DIAGNOSIS — I10 Essential (primary) hypertension: Secondary | ICD-10-CM | POA: Diagnosis not present

## 2017-01-12 DIAGNOSIS — I272 Pulmonary hypertension, unspecified: Secondary | ICD-10-CM | POA: Diagnosis not present

## 2017-01-12 DIAGNOSIS — I442 Atrioventricular block, complete: Secondary | ICD-10-CM | POA: Diagnosis not present

## 2017-01-13 ENCOUNTER — Encounter (HOSPITAL_COMMUNITY): Payer: Self-pay

## 2017-01-13 ENCOUNTER — Encounter (HOSPITAL_COMMUNITY)
Admission: RE | Admit: 2017-01-13 | Discharge: 2017-01-13 | Disposition: A | Discharge status: Home / Self Care | Payer: Medicare Other | Source: Ambulatory Visit | Attending: General Surgery | Admitting: General Surgery

## 2017-01-13 DIAGNOSIS — I272 Pulmonary hypertension, unspecified: Secondary | ICD-10-CM | POA: Diagnosis not present

## 2017-01-13 DIAGNOSIS — K810 Acute cholecystitis: Secondary | ICD-10-CM | POA: Diagnosis not present

## 2017-01-13 DIAGNOSIS — K8080 Other cholelithiasis without obstruction: Secondary | ICD-10-CM | POA: Insufficient documentation

## 2017-01-13 DIAGNOSIS — I10 Essential (primary) hypertension: Secondary | ICD-10-CM | POA: Diagnosis not present

## 2017-01-13 DIAGNOSIS — Z01812 Encounter for preprocedural laboratory examination: Secondary | ICD-10-CM | POA: Diagnosis not present

## 2017-01-13 DIAGNOSIS — I442 Atrioventricular block, complete: Secondary | ICD-10-CM | POA: Diagnosis not present

## 2017-01-13 DIAGNOSIS — I25119 Atherosclerotic heart disease of native coronary artery with unspecified angina pectoris: Secondary | ICD-10-CM | POA: Diagnosis not present

## 2017-01-13 DIAGNOSIS — E119 Type 2 diabetes mellitus without complications: Secondary | ICD-10-CM | POA: Diagnosis not present

## 2017-01-13 HISTORY — DX: Headache: R51

## 2017-01-13 HISTORY — DX: Family history of other specified conditions: Z84.89

## 2017-01-13 HISTORY — DX: Pulmonary hypertension, unspecified: I27.20

## 2017-01-13 HISTORY — DX: Presence of dental prosthetic device (complete) (partial): Z97.2

## 2017-01-13 HISTORY — DX: Unspecified macular degeneration: H35.30

## 2017-01-13 HISTORY — DX: Headache, unspecified: R51.9

## 2017-01-13 HISTORY — DX: Dyspnea, unspecified: R06.00

## 2017-01-13 HISTORY — DX: Calculus of gallbladder without cholecystitis without obstruction: K80.20

## 2017-01-13 LAB — CBC
HEMATOCRIT: 40.1 % (ref 36.0–46.0)
HEMOGLOBIN: 13.1 g/dL (ref 12.0–15.0)
MCH: 27.6 pg (ref 26.0–34.0)
MCHC: 32.7 g/dL (ref 30.0–36.0)
MCV: 84.6 fL (ref 78.0–100.0)
Platelets: 229 10*3/uL (ref 150–400)
RBC: 4.74 MIL/uL (ref 3.87–5.11)
RDW: 13.6 % (ref 11.5–15.5)
WBC: 6 10*3/uL (ref 4.0–10.5)

## 2017-01-13 LAB — BASIC METABOLIC PANEL
ANION GAP: 9 (ref 5–15)
BUN: 21 mg/dL — ABNORMAL HIGH (ref 6–20)
CALCIUM: 9.8 mg/dL (ref 8.9–10.3)
CHLORIDE: 101 mmol/L (ref 101–111)
CO2: 28 mmol/L (ref 22–32)
Creatinine, Ser: 0.79 mg/dL (ref 0.44–1.00)
GFR calc non Af Amer: >60 mL/min (ref >60)
GLUCOSE: 188 mg/dL — AB (ref 65–99)
POTASSIUM: 3.8 mmol/L (ref 3.5–5.1)
Sodium: 138 mmol/L (ref 135–145)

## 2017-01-13 LAB — GLUCOSE, CAPILLARY: Glucose-Capillary: 239 mg/dL — ABNORMAL HIGH (ref 65–99)

## 2017-01-13 NOTE — Progress Notes (Signed)
Pt denies any acute cardiopulmonary issues. Pt is under the care of Dr. Lovena Le, Cardiology. Pt denies having a stress test but stated that a cardiac cath was performed > 10 years ago. Pt denies having an A1c within the last 2 months. Pt denies having any other recent labs. Spoke with Abigail Butts, CMA, to have MD clarify pre-op instructions regarding Aspirin; MD advised that pt stop taking Aspirin now ( per Abigail Butts). Pt chart forwarded to anesthesia to review cardiac history and clearance.

## 2017-01-13 NOTE — Pre-Procedure Instructions (Signed)
Maria [name]  01/13/2017      [id number] [address], [place] [address] Phone: [phone] Fax: [phone]  [id number] [place], [place] [address] [place] [address] Phone: [phone] Fax: [phone]  [facility] [id number] [place], [facility] [address] [place] [address] Phone: [phone] Fax: [phone]    Your procedure is scheduled on Tuesday, Jan 18, 2017  Report to [place] at 5:30 A.M.  Call this number if you have problems the morning of surgery:  [phone]   Remember:  Do not eat food or drink liquids after midnight Monday, Jan 17, 2017  Take these medicines the morning of surgery with A SIP OF WATER: acetaminophen (TYLENOL),  Refresh eye drops, if needed: valACYclovir (VALTREX) for outbreaks Stop taking Aspirin, vitamins, fish oil and herbal medications. Do not take any NSAIDs ie: Ibuprofen, Advil, Naproxen (ANAPROX), BC and Goody Powder or any medication containing Aspirin;stop now.    How to Manage Your Diabetes Before and After Surgery  Why is it important to control my blood sugar before and after surgery? . Improving blood sugar levels before and after surgery helps healing and can limit problems. . A way of improving blood sugar control is eating a healthy diet by: o  Eating less sugar and carbohydrates o  Increasing activity/exercise o  Talking with your doctor about reaching your blood sugar goals . High blood sugars (greater than 180 mg/dL) can raise your risk of infections and slow your recovery, so you will need to focus on controlling your diabetes during the weeks before surgery. . Make sure that the doctor who takes care of your diabetes knows about your planned surgery including the date and location.  How do I manage my blood sugar before surgery? . Check  your blood sugar at least 4 times a day, starting 2 days before surgery, to make sure that the level is not too high or low. o Check your blood sugar the morning of your surgery when you wake up and every 2 hours until you get to the Short Stay unit. . If your blood sugar is less than 70 mg/dL, you will need to treat for low blood sugar: o Do not take insulin. o Treat a low blood sugar (less than 70 mg/dL) with  cup of clear juice (cranberry or apple), 4 glucose tablets, OR glucose gel. o Recheck blood sugar in 15 minutes after treatment (to make sure it is greater than 70 mg/dL). If your blood sugar is not greater than 70 mg/dL on recheck, call [phone] for further instructions. . Report your blood sugar to the short stay nurse when you get to Short Stay.  . If you are admitted to the hospital after surgery: o Your blood sugar will be checked by the staff and you will probably be given insulin after surgery (instead of oral diabetes medicines) to make sure you have good blood sugar levels. o The goal for blood sugar control after surgery is 80-180 mg/dL.  WHAT DO I DO ABOUT MY DIABETES MEDICATION?   Marland Kitchen Do not take oral diabetes medicines (pills) the morning of surgery metFORMIN (GLUCOPHAGE)  Reviewed and Endorsed by [facility] Patient Education Committee, August 2015  Do not wear jewelry, make-up or nail polish.  Do not wear lotions, powders, or perfumes, or deoderant.  Do not shave 48 hours prior  to surgery.    Do not bring valuables to the hospital.  [facility] is not responsible for any belongings or valuables.  Contacts, dentures or bridgework may not be worn into surgery.  Leave your suitcase in the car.  After surgery it may be brought to your room.  For patients admitted to the hospital, discharge time will be determined by your treatment team.  Patients discharged the day of surgery will not be allowed to drive home.   Special instructions: Shower the night before surgery  and the morning of surgery with CHG.  Please read over the following fact sheets that you were given. Pain Booklet, Coughing and Deep Breathing and Surgical Site Infection Prevention

## 2017-01-14 ENCOUNTER — Encounter (HOSPITAL_COMMUNITY): Payer: Self-pay

## 2017-01-14 DIAGNOSIS — I10 Essential (primary) hypertension: Secondary | ICD-10-CM | POA: Diagnosis not present

## 2017-01-14 DIAGNOSIS — I272 Pulmonary hypertension, unspecified: Secondary | ICD-10-CM | POA: Diagnosis not present

## 2017-01-14 DIAGNOSIS — K810 Acute cholecystitis: Secondary | ICD-10-CM | POA: Diagnosis not present

## 2017-01-14 DIAGNOSIS — I442 Atrioventricular block, complete: Secondary | ICD-10-CM | POA: Diagnosis not present

## 2017-01-14 DIAGNOSIS — E119 Type 2 diabetes mellitus without complications: Secondary | ICD-10-CM | POA: Diagnosis not present

## 2017-01-14 DIAGNOSIS — I25119 Atherosclerotic heart disease of native coronary artery with unspecified angina pectoris: Secondary | ICD-10-CM | POA: Diagnosis not present

## 2017-01-14 LAB — HEMOGLOBIN A1C
Hgb A1c MFr Bld: 6.7 % — ABNORMAL HIGH (ref 4.8–5.6)
Mean Plasma Glucose: 146 mg/dL

## 2017-01-14 NOTE — Progress Notes (Addendum)
Anesthesia Chart Review: Patient is a 83 year old female scheduled for laparoscopic cholecystectomy on 01/18/2017 by Dr. Rosendo Gros. She was hospitalized January 2018 with acute cholecystitis s/p percutaneous cholecystostomy.  History includes never smoker, Medtronic dual chamber PPM 11/21/01 (generator change '10), diabetes mellitus type 2, dyslipidemia, chest pain, anxiety, hypertension, breast cancer (not specified) '15, macular degeneration, exertional dyspnea, headaches, bilateral TKA. Echo 09/2016 showed EF 50-55%, moderate-severe TR with PA peak pressure 81 mmHg.  PCP is listed as Dr. Donavan Burnet.  EP cardiologist is Dr. Lovena Le. Last visit on 12/13/16 with Chanetta Marshall, NP. Normal PPM function. She was felt moderate risk for cholecystectomy given advanced age. There was no further cardiac testing that could be done to reduce risks. (Dr. Lovena Le had previously cleared per telephone message 12/01/16 and when Dr. Marlou Porch saw patient in the hospital in 09/2016, he wrote, "83 year old with acute cholecystitis. Elevated troponin 0.27, possible exertional anginal symptoms, unable to climb a flight of stairs, severe pulmonary hypertension. She is high risk for surgery. Try to avoid general anesthesia. Spoke to family about risk. Consider low-dose beta blocker. No signs of heart failure.")  Meds include ASA 81 mg (held 01/13/17), Hyzaar, metformin, KCl, Valtrex.  BP 139/67   Pulse 85   Temp 36.6 C (Oral)   Resp 18   Ht 5\' 3"  (1.6 m)   Wt 151 lb 14.4 oz (68.9 kg)   SpO2 98%   BMI 26.91 kg/m   EKG 12/13/16: AV sequential or dual chamber electronic pacemaker.  Echo 10/07/16: Summary  - Left ventricle: The cavity size was normal. Wall thickness was   increased in a pattern of mild LVH. Systolic function was normal.   The estimated ejection fraction was in the range of 50% to 55%.   Severe hypokinesis of the apical myocardium. Hypokinesis of the   anteroseptal myocardium. Doppler parameters are consistent  with   abnormal left ventricular relaxation (grade 1 diastolic   dysfunction). - Ventricular septum: The contour showed systolic flattening. These   changes are consistent with RV pressure overload. - Mitral valve: There was mild regurgitation. - Right ventricle: The cavity size was mildly dilated. Wall   thickness was normal. - Right atrium: The atrium was mildly dilated. - Tricuspid valve: There was moderate-severe regurgitation. - Pulmonary arteries: Systolic pressure was severely increased. PA   peak pressure: 81 mm Hg (S).  1V CXR 10/08/16: IMPRESSION: No significant interval change. Left lung base density likely atelectatic changes. Developing infiltrate is not excluded. Clinical correlation is recommended.  Preoperative labs noted. A1c 6.7.  Patient had recent cardiology evaluation. She is at increased surgical risk as discussed above. Anesthesiologist to evaluate patient and determine definitive anesthesia plan on the day of surgery. Intra-operative Magnet recommended for PPM. Reviewed chart with anesthesiologist Dr. Therisa Doyne.  [facility] Short Stay Center/Anesthesiology Phone [phone] 01/14/2017 11:06 AM

## 2017-01-17 DIAGNOSIS — I25119 Atherosclerotic heart disease of native coronary artery with unspecified angina pectoris: Secondary | ICD-10-CM | POA: Diagnosis not present

## 2017-01-17 DIAGNOSIS — I442 Atrioventricular block, complete: Secondary | ICD-10-CM | POA: Diagnosis not present

## 2017-01-17 DIAGNOSIS — E119 Type 2 diabetes mellitus without complications: Secondary | ICD-10-CM | POA: Diagnosis not present

## 2017-01-17 DIAGNOSIS — I10 Essential (primary) hypertension: Secondary | ICD-10-CM | POA: Diagnosis not present

## 2017-01-17 DIAGNOSIS — K810 Acute cholecystitis: Secondary | ICD-10-CM | POA: Diagnosis not present

## 2017-01-17 DIAGNOSIS — I272 Pulmonary hypertension, unspecified: Secondary | ICD-10-CM | POA: Diagnosis not present

## 2017-01-18 ENCOUNTER — Encounter (HOSPITAL_COMMUNITY): Payer: Self-pay | Admitting: *Deleted

## 2017-01-18 ENCOUNTER — Ambulatory Visit (HOSPITAL_COMMUNITY)
Admission: RE | Admit: 2017-01-18 | Discharge: 2017-01-18 | Disposition: A | Discharge status: Home / Self Care | Payer: Medicare Other | Source: Ambulatory Visit | Attending: General Surgery | Admitting: General Surgery

## 2017-01-18 ENCOUNTER — Ambulatory Visit (HOSPITAL_COMMUNITY): Payer: Medicare Other | Admitting: Certified Registered Nurse Anesthetist

## 2017-01-18 ENCOUNTER — Ambulatory Visit (HOSPITAL_COMMUNITY): Payer: Medicare Other | Admitting: Vascular Surgery

## 2017-01-18 ENCOUNTER — Encounter (HOSPITAL_COMMUNITY)
Admission: RE | Disposition: A | Discharge status: Home / Self Care | Payer: Self-pay | Source: Ambulatory Visit | Attending: General Surgery

## 2017-01-18 DIAGNOSIS — E785 Hyperlipidemia, unspecified: Secondary | ICD-10-CM | POA: Diagnosis not present

## 2017-01-18 DIAGNOSIS — Z79899 Other long term (current) drug therapy: Secondary | ICD-10-CM | POA: Diagnosis not present

## 2017-01-18 DIAGNOSIS — I25119 Atherosclerotic heart disease of native coronary artery with unspecified angina pectoris: Secondary | ICD-10-CM | POA: Diagnosis not present

## 2017-01-18 DIAGNOSIS — Z950 Presence of cardiac pacemaker: Secondary | ICD-10-CM | POA: Diagnosis not present

## 2017-01-18 DIAGNOSIS — K8010 Calculus of gallbladder with chronic cholecystitis without obstruction: Secondary | ICD-10-CM | POA: Diagnosis not present

## 2017-01-18 DIAGNOSIS — K810 Acute cholecystitis: Secondary | ICD-10-CM | POA: Diagnosis not present

## 2017-01-18 DIAGNOSIS — I10 Essential (primary) hypertension: Secondary | ICD-10-CM | POA: Diagnosis not present

## 2017-01-18 DIAGNOSIS — Z7984 Long term (current) use of oral hypoglycemic drugs: Secondary | ICD-10-CM | POA: Insufficient documentation

## 2017-01-18 DIAGNOSIS — E119 Type 2 diabetes mellitus without complications: Secondary | ICD-10-CM | POA: Insufficient documentation

## 2017-01-18 DIAGNOSIS — Z7982 Long term (current) use of aspirin: Secondary | ICD-10-CM | POA: Diagnosis not present

## 2017-01-18 DIAGNOSIS — I272 Pulmonary hypertension, unspecified: Secondary | ICD-10-CM | POA: Diagnosis not present

## 2017-01-18 DIAGNOSIS — K828 Other specified diseases of gallbladder: Secondary | ICD-10-CM | POA: Diagnosis not present

## 2017-01-18 DIAGNOSIS — I442 Atrioventricular block, complete: Secondary | ICD-10-CM | POA: Diagnosis not present

## 2017-01-18 HISTORY — PX: CHOLECYSTECTOMY: SHX55

## 2017-01-18 LAB — GLUCOSE, CAPILLARY
GLUCOSE-CAPILLARY: 163 mg/dL — AB (ref 65–99)
Glucose-Capillary: 147 mg/dL — ABNORMAL HIGH (ref 65–99)

## 2017-01-18 SURGERY — LAPAROSCOPIC CHOLECYSTECTOMY
Anesthesia: General | Site: Abdomen

## 2017-01-18 MED ORDER — ONDANSETRON HCL 4 MG/2ML IJ SOLN: 2.0000 mg | Freq: Once | INTRAMUSCULAR | Status: DC

## 2017-01-18 MED ORDER — ONDANSETRON HCL 4 MG/2ML IJ SOLN
INTRAMUSCULAR | Status: AC
  Filled 2017-01-18: qty 2

## 2017-01-18 MED ORDER — ROCURONIUM BROMIDE 100 MG/10ML IV SOLN
INTRAVENOUS | Status: DC | PRN
  Administered 2017-01-18: 20 mg via INTRAVENOUS
  Administered 2017-01-18: 40 mg via INTRAVENOUS

## 2017-01-18 MED ORDER — ACETAMINOPHEN 650 MG RE SUPP: 650.0000 mg | RECTAL | Status: DC | PRN

## 2017-01-18 MED ORDER — ONDANSETRON HCL 4 MG/2ML IJ SOLN
INTRAMUSCULAR | Status: AC
Start: 2017-01-18 — End: 2017-01-18
  Administered 2017-01-18: 2 mg
  Filled 2017-01-18: qty 2

## 2017-01-18 MED ORDER — SODIUM CHLORIDE 0.9% FLUSH: 3.0000 mL | INTRAVENOUS | Status: DC | PRN

## 2017-01-18 MED ORDER — SODIUM CHLORIDE 0.9 % IR SOLN
Status: DC | PRN
  Administered 2017-01-18: 1000 mL

## 2017-01-18 MED ORDER — PROPOFOL 10 MG/ML IV BOLUS
INTRAVENOUS | Status: DC | PRN
Start: 2017-01-18 — End: 2017-01-18
  Administered 2017-01-18: 60 mg via INTRAVENOUS
  Administered 2017-01-18 (×2): 30 mg via INTRAVENOUS

## 2017-01-18 MED ORDER — OXYCODONE HCL 5 MG PO TABS: 5.0000 mg | ORAL_TABLET | ORAL | Status: DC | PRN

## 2017-01-18 MED ORDER — DEXAMETHASONE SODIUM PHOSPHATE 10 MG/ML IJ SOLN
INTRAMUSCULAR | Status: AC
  Filled 2017-01-18: qty 1

## 2017-01-18 MED ORDER — FENTANYL CITRATE (PF) 100 MCG/2ML IJ SOLN
INTRAMUSCULAR | Status: DC | PRN
  Administered 2017-01-18 (×2): 25 ug via INTRAVENOUS
  Administered 2017-01-18: 100 ug via INTRAVENOUS
  Administered 2017-01-18 (×2): 25 ug via INTRAVENOUS

## 2017-01-18 MED ORDER — SODIUM CHLORIDE 0.9% FLUSH: 3.0000 mL | Freq: Two times a day (BID) | INTRAVENOUS | Status: DC

## 2017-01-18 MED ORDER — NEOSTIGMINE METHYLSULFATE 5 MG/5ML IV SOSY
PREFILLED_SYRINGE | INTRAVENOUS | Status: AC
  Filled 2017-01-18: qty 5

## 2017-01-18 MED ORDER — ACETAMINOPHEN 10 MG/ML IV SOLN
1000.0000 mg | Freq: Four times a day (QID) | INTRAVENOUS | Status: DC
  Administered 2017-01-18: 1000 mg via INTRAVENOUS

## 2017-01-18 MED ORDER — CEFAZOLIN SODIUM-DEXTROSE 2-4 GM/100ML-% IV SOLN
2.0000 g | INTRAVENOUS | Status: AC
  Administered 2017-01-18: 2 g via INTRAVENOUS
  Filled 2017-01-18: qty 100

## 2017-01-18 MED ORDER — LACTATED RINGERS IV SOLN
INTRAVENOUS | Status: DC | PRN
  Administered 2017-01-18 (×2): via INTRAVENOUS

## 2017-01-18 MED ORDER — CHLORHEXIDINE GLUCONATE CLOTH 2 % EX PADS: 6.0000 | MEDICATED_PAD | Freq: Once | CUTANEOUS | Status: DC

## 2017-01-18 MED ORDER — HYDRALAZINE HCL 20 MG/ML IJ SOLN
INTRAMUSCULAR | Status: DC | PRN
  Administered 2017-01-18 (×2): 5 mg via INTRAVENOUS

## 2017-01-18 MED ORDER — PROPRANOLOL HCL 1 MG/ML IV SOLN
0.2500 mg | INTRAVENOUS | Status: DC | PRN
  Administered 2017-01-18 (×6): 0.25 mg via INTRAVENOUS
  Filled 2017-01-18 (×7): qty 0.3

## 2017-01-18 MED ORDER — ONDANSETRON HCL 4 MG/2ML IJ SOLN
INTRAMUSCULAR | Status: DC | PRN
  Administered 2017-01-18: 4 mg via INTRAVENOUS

## 2017-01-18 MED ORDER — ACETAMINOPHEN 10 MG/ML IV SOLN
INTRAVENOUS | Status: AC
  Filled 2017-01-18: qty 100

## 2017-01-18 MED ORDER — SODIUM CHLORIDE 0.9 % IV SOLN: 250.0000 mL | INTRAVENOUS | Status: DC | PRN

## 2017-01-18 MED ORDER — LIDOCAINE HCL (CARDIAC) 20 MG/ML IV SOLN
INTRAVENOUS | Status: DC | PRN
  Administered 2017-01-18: 100 mg via INTRAVENOUS

## 2017-01-18 MED ORDER — PROPOFOL 10 MG/ML IV BOLUS
INTRAVENOUS | Status: AC
  Filled 2017-01-18: qty 20

## 2017-01-18 MED ORDER — FENTANYL CITRATE (PF) 100 MCG/2ML IJ SOLN
25.0000 ug | INTRAMUSCULAR | Status: DC | PRN
  Administered 2017-01-18: 50 ug via INTRAVENOUS

## 2017-01-18 MED ORDER — BUPIVACAINE HCL 0.25 % IJ SOLN
INTRAMUSCULAR | Status: DC | PRN
  Administered 2017-01-18: 6 mL

## 2017-01-18 MED ORDER — OXYCODONE-ACETAMINOPHEN 5-325 MG PO TABS: 1.0000 | ORAL_TABLET | Freq: Four times a day (QID) | ORAL | 0 refills | Status: AC | PRN

## 2017-01-18 MED ORDER — 0.9 % SODIUM CHLORIDE (POUR BTL) OPTIME
TOPICAL | Status: DC | PRN
  Administered 2017-01-18: 1000 mL

## 2017-01-18 MED ORDER — BUPIVACAINE HCL (PF) 0.25 % IJ SOLN
INTRAMUSCULAR | Status: AC
  Filled 2017-01-18: qty 30

## 2017-01-18 MED ORDER — FENTANYL CITRATE (PF) 100 MCG/2ML IJ SOLN
INTRAMUSCULAR | Status: AC
  Administered 2017-01-18: 50 ug
  Filled 2017-01-18: qty 2

## 2017-01-18 MED ORDER — ACETAMINOPHEN 325 MG PO TABS: 650.0000 mg | ORAL_TABLET | ORAL | Status: DC | PRN

## 2017-01-18 MED ORDER — MORPHINE SULFATE (PF) 2 MG/ML IV SOLN: 2.0000 mg | INTRAVENOUS | Status: DC | PRN

## 2017-01-18 MED ORDER — FENTANYL CITRATE (PF) 250 MCG/5ML IJ SOLN
INTRAMUSCULAR | Status: AC
  Filled 2017-01-18: qty 5

## 2017-01-18 MED ORDER — HEMOSTATIC AGENTS (NO CHARGE) OPTIME
TOPICAL | Status: DC | PRN
  Administered 2017-01-18: 1 via TOPICAL

## 2017-01-18 MED ORDER — SUGAMMADEX SODIUM 200 MG/2ML IV SOLN
INTRAVENOUS | Status: DC | PRN
  Administered 2017-01-18: 200 mg via INTRAVENOUS

## 2017-01-18 SURGICAL SUPPLY — 43 items
BENZOIN TINCTURE PRP APPL 2/3 (GAUZE/BANDAGES/DRESSINGS) ×2 IMPLANT
CANISTER SUCT 3000ML PPV (MISCELLANEOUS) ×2 IMPLANT
CHLORAPREP W/TINT 26ML (MISCELLANEOUS) ×2 IMPLANT
CLIP LIGATING HEMO O LOK GREEN (MISCELLANEOUS) ×2 IMPLANT
CLSR STERI-STRIP ANTIMIC 1/2X4 (GAUZE/BANDAGES/DRESSINGS) ×2 IMPLANT
COVER SURGICAL LIGHT HANDLE (MISCELLANEOUS) ×2 IMPLANT
COVER TRANSDUCER ULTRASND (DRAPES) ×2 IMPLANT
DEVICE TROCAR PUNCTURE CLOSURE (ENDOMECHANICALS) ×2 IMPLANT
ELECT REM PT RETURN 9FT ADLT (ELECTROSURGICAL) ×2
ELECTRODE REM PT RTRN 9FT ADLT (ELECTROSURGICAL) ×1 IMPLANT
ENDOLOOP SUT PDS II  0 18 (SUTURE) ×2
ENDOLOOP SUT PDS II 0 18 (SUTURE) ×2 IMPLANT
GAUZE SPONGE 2X2 8PLY STRL LF (GAUZE/BANDAGES/DRESSINGS) ×1 IMPLANT
GLOVE BIO SURGEON STRL SZ7.5 (GLOVE) ×4 IMPLANT
GLOVE BIOGEL PI IND STRL 7.5 (GLOVE) ×1 IMPLANT
GLOVE BIOGEL PI INDICATOR 7.5 (GLOVE) ×1
GOWN STRL REUS W/ TWL LRG LVL3 (GOWN DISPOSABLE) ×3 IMPLANT
GOWN STRL REUS W/ TWL XL LVL3 (GOWN DISPOSABLE) ×1 IMPLANT
GOWN STRL REUS W/TWL LRG LVL3 (GOWN DISPOSABLE) ×3
GOWN STRL REUS W/TWL XL LVL3 (GOWN DISPOSABLE) ×1
HEMOSTAT SNOW SURGICEL 2X4 (HEMOSTASIS) ×2 IMPLANT
KIT BASIN OR (CUSTOM PROCEDURE TRAY) ×2 IMPLANT
KIT ROOM TURNOVER OR (KITS) ×2 IMPLANT
NEEDLE INSUFFLATION 14GA 120MM (NEEDLE) ×2 IMPLANT
NS IRRIG 1000ML POUR BTL (IV SOLUTION) ×2 IMPLANT
PAD ARMBOARD 7.5X6 YLW CONV (MISCELLANEOUS) ×4 IMPLANT
POUCH RETRIEVAL ECOSAC 10 (ENDOMECHANICALS) ×1 IMPLANT
POUCH RETRIEVAL ECOSAC 10MM (ENDOMECHANICALS) ×1
SCISSORS LAP 5X35 DISP (ENDOMECHANICALS) ×2 IMPLANT
SET IRRIG TUBING LAPAROSCOPIC (IRRIGATION / IRRIGATOR) ×2 IMPLANT
SLEEVE ENDOPATH XCEL 5M (ENDOMECHANICALS) ×6 IMPLANT
SPECIMEN JAR SMALL (MISCELLANEOUS) ×2 IMPLANT
SPONGE GAUZE 2X2 STER 10/PKG (GAUZE/BANDAGES/DRESSINGS) ×1
STRIP CLOSURE SKIN 1/2X4 (GAUZE/BANDAGES/DRESSINGS) ×2 IMPLANT
SUT MNCRL AB 3-0 PS2 18 (SUTURE) ×2 IMPLANT
TAPE CLOTH SURG 4X10 WHT LF (GAUZE/BANDAGES/DRESSINGS) ×2 IMPLANT
TOWEL GREEN STERILE FF (TOWEL DISPOSABLE) ×2 IMPLANT
TOWEL OR 17X24 6PK STRL BLUE (TOWEL DISPOSABLE) ×2 IMPLANT
TOWEL OR 17X26 10 PK STRL BLUE (TOWEL DISPOSABLE) IMPLANT
TRAY LAPAROSCOPIC MC (CUSTOM PROCEDURE TRAY) ×2 IMPLANT
TROCAR XCEL NON-BLD 11X100MML (ENDOMECHANICALS) ×2 IMPLANT
TROCAR XCEL NON-BLD 5MMX100MML (ENDOMECHANICALS) ×2 IMPLANT
TUBING INSUFFLATION (TUBING) ×2 IMPLANT

## 2017-01-18 NOTE — H&P (View-Only) (Signed)
History of Present Illness Ralene Ok MD; 12/28/2016 3:38 PM) The patient is a 83 year old female who presents with a complaint of cholecystostomy tube. Linear female who we presents after having cardiac evaluation. Patient is seeing Dr. Lovena Le prescription has been cleared for surgery from a cardiac standpoint without any further workup. Patient does have a pacemaker. Patient has been cleared to stop her aspirin prior to surgery. Patient continues to drain her cholecystostomy bag, continue flushes. She's had no further rectal quadrant pain, fever, chills, nausea, vomiting. All other reviews systems are negative.  --------------------------------------- Patient is a 83 year old female who was seen in the hospital in January 2018 for an evaluation of cholecystitis. Secondary to patient's cardiovascular issues is determined at that time the patient would likely benefit from a cholecystostomy tube. This was placed. Patient underwent cholecystostomy tube was left in place.  Patient otherwise states she's had no pain. She has had no issues with eating. No nausea no vomiting.   Allergies Malachy Moan, [place]; 12/28/2016 2:57 PM) No Known Allergies 11/30/2016  Medication History Malachy Moan, [place]; 12/28/2016 2:58 PM) PreserVision AREDS 2+Multi Vit (Oral) Active. Losartan Potassium-HCTZ (50-12.5MG  Tablet, Oral) Active. MetFORMIN HCl (500MG  Tablet, Oral) Active. Sodium Chloride Flush (0.9% Solution, Intravenous) Active. ValACYclovir HCl (1GM Tablet, Oral) Active. Acetaminophen (500MG  Tablet, Oral) Active. Aspirin (81MG  Tablet DR, Oral) Active. Calcium-Magnesium-Vitamin D (500-250-200MG -MG-UNIT Tablet, Oral) Active. Medications Reconciled    Review of Systems Ralene Ok, MD; 12/28/2016 3:39 PM) General Present- Weight Loss. Not Present- Appetite Loss, Chills, Fatigue, Fever, Night Sweats and Weight Gain. Skin Not Present- Change in Wart/Mole, Dryness, Hives,  Jaundice, New Lesions, Non-Healing Wounds, Rash and Ulcer. HEENT Present- Wears glasses/contact lenses. Not Present- Earache, Hearing Loss, Hoarseness, Nose Bleed, Oral Ulcers, Ringing in the Ears, Seasonal Allergies, Sinus Pain, Sore Throat, Visual Disturbances and Yellow Eyes. Respiratory Not Present- Bloody sputum, Chronic Cough, Difficulty Breathing, Snoring and Wheezing. Cardiovascular Present- Leg Cramps and Shortness of Breath. Not Present- Chest Pain, Difficulty Breathing Lying Down, Palpitations, Rapid Heart Rate and Swelling of Extremities. Gastrointestinal Present- Abdominal Pain and Constipation. Not Present- Bloating, Bloody Stool, Change in Bowel Habits, Chronic diarrhea, Difficulty Swallowing, Excessive gas, Gets full quickly at meals, Hemorrhoids, Indigestion, Nausea, Rectal Pain and Vomiting. Female Genitourinary Present- Pelvic Pain. Not Present- Frequency, Nocturia, Painful Urination and Urgency. Musculoskeletal Present- Back Pain, Joint Pain, Joint Stiffness and Muscle Pain. Not Present- Muscle Weakness and Swelling of Extremities. Neurological Present- Decreased Memory, Tremor, Trouble walking and Weakness. Not Present- Fainting, Headaches, Numbness, Seizures and Tingling. Psychiatric Present- Anxiety and Depression. Not Present- Bipolar, Change in Sleep Pattern, Fearful and Frequent crying. Endocrine Present- Hot flashes. Not Present- Cold Intolerance, Excessive Hunger, Hair Changes, Heat Intolerance and New Diabetes.  Vitals Malachy Moan RMA; 12/28/2016 2:58 PM) 12/28/2016 2:58 PM Weight: 152.2 lb Height: 63in Body Surface Area: 1.72 m Body Mass Index: 26.96 kg/m  Temp.: 97.70F  Pulse: 77 (Regular)  BP: 124/70 (Sitting, Left Arm, Standard)       Physical Exam Ralene Ok, MD; 12/28/2016 3:39 PM) General Mental Status-Alert. General Appearance-Consistent with stated age. Hydration-Well hydrated. Voice-Normal.  Head and  Neck Head-normocephalic, atraumatic with no lesions or palpable masses. Trachea-midline. Thyroid Gland Characteristics - normal size and consistency.  Chest and Lung Exam Chest and lung exam reveals -quiet, even and easy respiratory effort with no use of accessory muscles and on auscultation, normal breath sounds, no adventitious sounds and normal vocal resonance. Inspection Chest Wall - Normal. Back - normal.  Cardiovascular Cardiovascular examination reveals -normal  heart sounds, regular rate and rhythm with no murmurs and normal pedal pulses bilaterally.  Abdomen Note: Cholecystostomy tube drain in place, bilious output   Neurologic Neurologic evaluation reveals -alert and oriented x 3 with no impairment of recent or remote memory. Mental Status-Normal.  Musculoskeletal Normal Exam - Left-Upper Extremity Strength Normal and Lower Extremity Strength Normal. Normal Exam - Right-Upper Extremity Strength Normal and Lower Extremity Strength Normal.    Assessment & Plan Ralene Ok MD; 12/28/2016 3:39 PM) CHOLECYSTOSTOMY TUBE DYSFUNCTION, INITIAL ENCOUNTER (T85.518A) Impression: 83 year old female with cholecystostomy tube in place. Patient has multiple comorbidities.  1. Patient has been cleared by Dr. Lovena Le. Patient does have a pacemaker in place will likely require pacemaker rep present on his surgery to interrogate the pacemaker. 2. We'll proceed to the operative for laparoscopic cholecystectomy. 3. Risks and benefits were discussed with the patient to generally include, but not limited to: infection, bleeding, possible need for post op ERCP, damage to the bile ducts, bile leak, and possible need for further surgery. Alternatives were offered and described. All questions were answered and the patient voiced understanding of the procedure and wishes to proceed at this point with a laparoscopic cholecystectomy

## 2017-01-18 NOTE — Anesthesia Postprocedure Evaluation (Addendum)
Anesthesia Post Note  Patient: Maria [name]  Procedure(s) Performed: Procedure(s) (LRB): LAPAROSCOPIC CHOLECYSTECTOMY (N/A)  Patient location during evaluation: PACU Anesthesia Type: General Level of consciousness: awake and sedated Pain management: pain level controlled Vital Signs Assessment: post-procedure vital signs reviewed and stable Respiratory status: spontaneous breathing, nonlabored ventilation, respiratory function stable and patient connected to nasal cannula oxygen Cardiovascular status: blood pressure returned to baseline and stable Postop Assessment: no signs of nausea or vomiting Anesthetic complications: no       Last Vitals:  Vitals:   01/18/17 1053 01/18/17 1115  BP: (!) 185/90 (!) 161/85  Pulse:    Resp:    Temp:      Last Pain:  Vitals:   01/18/17 1012  TempSrc:   PainSc: Asleep                 Amorah Sebring,JAMES TERRILL

## 2017-01-18 NOTE — Anesthesia Preprocedure Evaluation (Addendum)
Anesthesia Evaluation  Patient identified by MRN, date of birth, ID band Patient awake    Airway Mallampati: II   Neck ROM: Full    Dental no notable dental hx.    Pulmonary shortness of breath,    breath sounds clear to auscultation       Cardiovascular hypertension, + pacemaker  Rhythm:Regular Rate:Normal  Echo noted severe TR c elevated Pa pressure   Neuro/Psych    GI/Hepatic   Endo/Other  diabetes  Renal/GU      Musculoskeletal   Abdominal   Peds  Hematology   Anesthesia Other Findings   Reproductive/Obstetrics                           Anesthesia Physical Anesthesia Plan  ASA: IV  Anesthesia Plan: General   Post-op Pain Management:    Induction: Intravenous  Airway Management Planned: Oral ETT  Additional Equipment:   Intra-op Plan:   Post-operative Plan: Extubation in OR  Informed Consent: I have reviewed the patients History and Physical, chart, labs and discussed the procedure including the risks, benefits and alternatives for the proposed anesthesia with the patient or authorized representative who has indicated his/her understanding and acceptance.     Plan Discussed with: CRNA  Anesthesia Plan Comments:        Anesthesia Quick Evaluation

## 2017-01-18 NOTE — Op Note (Signed)
01/18/2017  8:39 AM  PATIENT:  Maria [name]  83 y.o. female  PRE-OPERATIVE DIAGNOSIS:  Cholecystostomy tube, Gallstones, Chronic cholecystitis  POST-OPERATIVE DIAGNOSIS:  same  PROCEDURE:  Procedure(s): LAPAROSCOPIC CHOLECYSTECTOMY, removal of cholecystostomy tube (N/A)  SURGEON:  Surgeon(s) and Role:    Ralene Ok, MD - Primary  ASSISTANTS: Algis Greenhouse, RNFA   ANESTHESIA:   local and general  EBL:  10cc  BLOOD ADMINISTERED:none  DRAINS: none   LOCAL MEDICATIONS USED:  BUPIVICAINE   SPECIMEN:  Source of Specimen:  gallbladder  DISPOSITION OF SPECIMEN:  PATHOLOGY  COUNTS:  YES  TOURNIQUET:  * No tourniquets in log *  DICTATION: .Dragon Dictation The patient was taken to the operating and placed in the supine position with bilateral SCDs in place. The patient was prepped and draped in the usual sterile fashion. A time out was called and all facts were verified. A pneumoperitoneum was obtained via A Veress needle technique to a pressure of 30mm of mercury.  A 8mm trochar was then placed in the right upper quadrant under visualization, and there were no injuries to any abdominal organs. A 11 mm port was then placed in the umbilical region after infiltrating with local anesthesia under direct visualization. A second and third epigastric port and right lower quadrant port placement under direct visualization, respectively. The liver was very fatty and large. The gallbladder was identified and retracted. There was some thin adhesion to the gallbladder.  The adhesions and the peritoneum was then sharply dissected from the gallbladder and this dissection was carried down to Calot's triangle. The gallbladder was identified and stripped away circumferentially and seen going into the gallbladder 360, the critical angle was obtained.  2 clips were placed proximally one distally and the cystic artery and transected. The cystic duct was dilated.  It was transected after a clip was  placed on the proximal duct.  An Endoloop was placed around the cystic dump stump. We then proceeded to remove the gallbladder off the hepatic fossa with Bovie cautery.   The cholecystostomy tube was removed entirely.  A retrieval bag was then placed in the abdomen and gallbladder placed in the bag. The hepatic fossa was then reexamined and hemostasis was achieved with Bovie cautery and was excellent at the end of the case. The subhepatic fossa and perihepatic fossa was then irrigated until the effluent was clear.  The gallbladder and bag were removed from the abdominal cavity. The 11 mm trocar fascia was reapproximated with the Endo Close #1 Vicryl x2. The pneumoperitoneum was evacuated and all trochars removed under direct visulalization. The skin was then closed with 4-0 Monocryl and the skin dressed with Steri-Strips, gauze, and tape. The patient was awaken from general anesthesia and taken to the recovery room in stable condition.   PLAN OF CARE: Admit for overnight observation  PATIENT DISPOSITION:  PACU - hemodynamically stable.   Delay start of Pharmacological VTE agent (>24hrs) due to surgical blood loss or risk of bleeding: not applicable

## 2017-01-18 NOTE — Discharge Instructions (Signed)
[facility] ______[facility] °LAPAROSCOPIC SURGERY: POST OP INSTRUCTIONS °Always review your discharge instruction sheet given to you by the facility where your surgery was performed. °IF YOU HAVE DISABILITY OR FAMILY LEAVE FORMS, YOU MUST BRING THEM TO THE OFFICE FOR PROCESSING.   °DO NOT GIVE THEM TO YOUR DOCTOR. ° °1. A prescription for pain medication may be given to you upon discharge.  Take your pain medication as prescribed, if needed.  If narcotic pain medicine is not needed, then you may take acetaminophen (Tylenol) or ibuprofen (Advil) as needed. °2. Take your usually prescribed medications unless otherwise directed. °3. If you need a refill on your pain medication, please contact your pharmacy.  They will contact our office to request authorization. Prescriptions will not be filled after 5pm or on week-ends. °4. You should follow a light diet the first few days after arrival home, such as soup and crackers, etc.  Be sure to include lots of fluids daily. °5. Most patients will experience some swelling and bruising in the area of the incisions.  Ice packs will help.  Swelling and bruising can take several days to resolve.  °6. It is common to experience some constipation if taking pain medication after surgery.  Increasing fluid intake and taking a stool softener (such as Colace) will usually help or prevent this problem from occurring.  A mild laxative (Milk of Magnesia or Miralax) should be taken according to package instructions if there are no bowel movements after 48 hours. °7. Unless discharge instructions indicate otherwise, you may remove your bandages 24-48 hours after surgery, and you may shower at that time.  You may have steri-strips (small skin tapes) in place directly over the incision.  These strips should be left on the skin for 7-10 days.  If your surgeon used skin glue on the incision, you may shower in 24 hours.  The glue will flake off over the next 2-3 weeks.  Any sutures or  staples will be removed at the office during your follow-up visit. °8. ACTIVITIES:  You may resume regular (light) daily activities beginning the next day--such as daily self-care, walking, climbing stairs--gradually increasing activities as tolerated.  You may have sexual intercourse when it is comfortable.  Refrain from any heavy lifting or straining until approved by your doctor. °a. You may drive when you are no longer taking prescription pain medication, you can comfortably wear a seatbelt, and you can safely maneuver your car and apply brakes. °b. RETURN TO WORK:  __________________________________________________________ °9. You should see your doctor in the office for a follow-up appointment approximately 2-3 weeks after your surgery.  Make sure that you call for this appointment within a day or two after you arrive home to insure a convenient appointment time. °10. OTHER INSTRUCTIONS: __________________________________________________________________________________________________________________________ __________________________________________________________________________________________________________________________ °WHEN TO CALL YOUR DOCTOR: °1. Fever over 101.0 °2. Inability to urinate °3. Continued bleeding from incision. °4. Increased pain, redness, or drainage from the incision. °5. Increasing abdominal pain ° °The clinic staff is available to answer your questions during regular business hours.  Please don’t hesitate to call and ask to speak to one of the nurses for clinical concerns.  If you have a medical emergency, go to the nearest emergency room or call 911.  A surgeon from [facility] is always on call at the hospital. °[address], [address], [place], [place]  [address] ? [address] [address], [place], [place]   [address] °[phone] ? [phone] ? FAX [phone] °Web site:   [email or web address] °

## 2017-01-18 NOTE — Anesthesia Procedure Notes (Signed)
Procedure Name: Intubation Performed by: Valda Favia Pre-anesthesia Checklist: Patient identified, Emergency Drugs available, Suction available, Patient being monitored and Timeout performed Patient Re-evaluated:Patient Re-evaluated prior to inductionOxygen Delivery Method: Circle system utilized Preoxygenation: Pre-oxygenation with 100% oxygen Intubation Type: IV induction Ventilation: Mask ventilation without difficulty Laryngoscope Size: Mac and 3 Grade View: Grade II Tube type: Oral Tube size: 7.0 mm Number of attempts: 1 Airway Equipment and Method: Stylet Placement Confirmation: ETT inserted through vocal cords under direct vision,  positive ETCO2 and breath sounds checked- equal and bilateral Secured at: 20 cm Tube secured with: Tape Dental Injury: Teeth and Oropharynx as per pre-operative assessment

## 2017-01-18 NOTE — Interval H&P Note (Signed)
History and Physical Interval Note:  01/18/2017 7:22 AM  Maria [name]  has presented today for surgery, with the diagnosis of Gallstones  The various methods of treatment have been discussed with the patient and family. After consideration of risks, benefits and other options for treatment, the patient has consented to  Procedure(s): LAPAROSCOPIC CHOLECYSTECTOMY (N/A) as a surgical intervention .  The patient's history has been reviewed, patient examined, no change in status, stable for surgery.  I have reviewed the patient's chart and labs.  Questions were answered to the patient's satisfaction.     Rosario Jacks., Anne Hahn

## 2017-01-18 NOTE — Transfer of Care (Signed)
Immediate Anesthesia Transfer of Care Note  Patient: Maria [name]  Procedure(s) Performed: Procedure(s): LAPAROSCOPIC CHOLECYSTECTOMY (N/A)  Patient Location: PACU  Anesthesia Type:General  Level of Consciousness: awake and alert   Airway & Oxygen Therapy: Patient Spontanous Breathing and Patient connected to face mask oxygen  Post-op Assessment: Report given to RN, Post -op Vital signs reviewed and stable and Patient moving all extremities X 4  Post vital signs: Reviewed and stable  Last Vitals:  Vitals:   01/18/17 0628 01/18/17 0905  BP: (!) 157/73 (!) 145/74  Pulse: 68 75  Resp: 18 (!) 9  Temp: 36.5 C 36.1 C    Last Pain:  Vitals:   01/18/17 0632  TempSrc:   PainSc: 7          Complications: No apparent anesthesia complications

## 2017-01-19 ENCOUNTER — Encounter (HOSPITAL_COMMUNITY): Payer: Self-pay | Admitting: General Surgery

## 2017-01-21 DIAGNOSIS — I272 Pulmonary hypertension, unspecified: Secondary | ICD-10-CM | POA: Diagnosis not present

## 2017-01-21 DIAGNOSIS — E119 Type 2 diabetes mellitus without complications: Secondary | ICD-10-CM | POA: Diagnosis not present

## 2017-01-21 DIAGNOSIS — K810 Acute cholecystitis: Secondary | ICD-10-CM | POA: Diagnosis not present

## 2017-01-21 DIAGNOSIS — I10 Essential (primary) hypertension: Secondary | ICD-10-CM | POA: Diagnosis not present

## 2017-01-21 DIAGNOSIS — I442 Atrioventricular block, complete: Secondary | ICD-10-CM | POA: Diagnosis not present

## 2017-01-21 DIAGNOSIS — I25119 Atherosclerotic heart disease of native coronary artery with unspecified angina pectoris: Secondary | ICD-10-CM | POA: Diagnosis not present

## 2017-01-24 DIAGNOSIS — I25119 Atherosclerotic heart disease of native coronary artery with unspecified angina pectoris: Secondary | ICD-10-CM | POA: Diagnosis not present

## 2017-01-24 DIAGNOSIS — E119 Type 2 diabetes mellitus without complications: Secondary | ICD-10-CM | POA: Diagnosis not present

## 2017-01-24 DIAGNOSIS — K810 Acute cholecystitis: Secondary | ICD-10-CM | POA: Diagnosis not present

## 2017-01-24 DIAGNOSIS — I272 Pulmonary hypertension, unspecified: Secondary | ICD-10-CM | POA: Diagnosis not present

## 2017-01-24 DIAGNOSIS — I442 Atrioventricular block, complete: Secondary | ICD-10-CM | POA: Diagnosis not present

## 2017-01-24 DIAGNOSIS — I10 Essential (primary) hypertension: Secondary | ICD-10-CM | POA: Diagnosis not present

## 2017-01-26 DIAGNOSIS — I272 Pulmonary hypertension, unspecified: Secondary | ICD-10-CM | POA: Diagnosis not present

## 2017-01-26 DIAGNOSIS — I25119 Atherosclerotic heart disease of native coronary artery with unspecified angina pectoris: Secondary | ICD-10-CM | POA: Diagnosis not present

## 2017-01-26 DIAGNOSIS — I442 Atrioventricular block, complete: Secondary | ICD-10-CM | POA: Diagnosis not present

## 2017-01-26 DIAGNOSIS — K810 Acute cholecystitis: Secondary | ICD-10-CM | POA: Diagnosis not present

## 2017-01-26 DIAGNOSIS — I10 Essential (primary) hypertension: Secondary | ICD-10-CM | POA: Diagnosis not present

## 2017-01-26 DIAGNOSIS — E119 Type 2 diabetes mellitus without complications: Secondary | ICD-10-CM | POA: Diagnosis not present

## 2017-01-31 DIAGNOSIS — I10 Essential (primary) hypertension: Secondary | ICD-10-CM | POA: Diagnosis not present

## 2017-01-31 DIAGNOSIS — E119 Type 2 diabetes mellitus without complications: Secondary | ICD-10-CM | POA: Diagnosis not present

## 2017-01-31 DIAGNOSIS — K810 Acute cholecystitis: Secondary | ICD-10-CM | POA: Diagnosis not present

## 2017-01-31 DIAGNOSIS — I442 Atrioventricular block, complete: Secondary | ICD-10-CM | POA: Diagnosis not present

## 2017-01-31 DIAGNOSIS — I25119 Atherosclerotic heart disease of native coronary artery with unspecified angina pectoris: Secondary | ICD-10-CM | POA: Diagnosis not present

## 2017-01-31 DIAGNOSIS — I272 Pulmonary hypertension, unspecified: Secondary | ICD-10-CM | POA: Diagnosis not present

## 2017-02-04 DIAGNOSIS — I10 Essential (primary) hypertension: Secondary | ICD-10-CM | POA: Diagnosis not present

## 2017-02-04 DIAGNOSIS — I272 Pulmonary hypertension, unspecified: Secondary | ICD-10-CM | POA: Diagnosis not present

## 2017-02-04 DIAGNOSIS — I442 Atrioventricular block, complete: Secondary | ICD-10-CM | POA: Diagnosis not present

## 2017-02-04 DIAGNOSIS — E119 Type 2 diabetes mellitus without complications: Secondary | ICD-10-CM | POA: Diagnosis not present

## 2017-02-04 DIAGNOSIS — I25119 Atherosclerotic heart disease of native coronary artery with unspecified angina pectoris: Secondary | ICD-10-CM | POA: Diagnosis not present

## 2017-02-04 DIAGNOSIS — K810 Acute cholecystitis: Secondary | ICD-10-CM | POA: Diagnosis not present

## 2017-02-11 NOTE — Addendum Note (Signed)
Addendum  created 02/11/17 1445 by Rica Koyanagi, MD   Sign clinical note

## 2017-02-28 ENCOUNTER — Encounter: Payer: Self-pay | Admitting: General Surgery

## 2017-03-09 ENCOUNTER — Ambulatory Visit: Payer: Medicare Other | Admitting: Podiatry

## 2017-03-14 ENCOUNTER — Telehealth: Payer: Self-pay | Admitting: Cardiology

## 2017-03-14 ENCOUNTER — Ambulatory Visit (INDEPENDENT_AMBULATORY_CARE_PROVIDER_SITE_OTHER): Payer: Medicare Other | Admitting: *Deleted

## 2017-03-14 DIAGNOSIS — I442 Atrioventricular block, complete: Secondary | ICD-10-CM | POA: Diagnosis not present

## 2017-03-14 NOTE — Telephone Encounter (Signed)
F/u Message  Pt returning RN call about transmission. Please call back to discuss

## 2017-03-14 NOTE — Telephone Encounter (Signed)
Attempted to confirm remote transmission with pt. No answer and was unable to leave a message.   

## 2017-03-14 NOTE — Telephone Encounter (Signed)
Attempted to help pt send remote transmission. After several unsuccessful attempts I instructed pt to call tech support. Pt verbalized understanding.

## 2017-03-15 ENCOUNTER — Encounter: Payer: Self-pay | Admitting: Cardiology

## 2017-03-15 LAB — CUP PACEART REMOTE DEVICE CHECK
Battery Remaining Longevity: 25 mo
Battery Voltage: 2.74 V
Brady Statistic AP VP Percent: 7 %
Brady Statistic AS VP Percent: 93 %
Date Time Interrogation Session: 20180702183640
Implantable Lead Implant Date: 20030311
Implantable Lead Model: 4592
Implantable Pulse Generator Implant Date: 20100120
Lead Channel Impedance Value: 710 Ohm
Lead Channel Pacing Threshold Amplitude: 0.375 V
Lead Channel Pacing Threshold Pulse Width: 0.4 ms
Lead Channel Sensing Intrinsic Amplitude: 2.8 mV
Lead Channel Setting Pacing Amplitude: 2.5 V
Lead Channel Setting Pacing Pulse Width: 0.4 ms
MDC IDC LEAD IMPLANT DT: 20030311
MDC IDC LEAD LOCATION: 753859
MDC IDC LEAD LOCATION: 753860
MDC IDC MSMT BATTERY IMPEDANCE: 2394 Ohm
MDC IDC MSMT LEADCHNL RA PACING THRESHOLD PULSEWIDTH: 0.4 ms
MDC IDC MSMT LEADCHNL RV IMPEDANCE VALUE: 675 Ohm
MDC IDC MSMT LEADCHNL RV PACING THRESHOLD AMPLITUDE: 0.625 V
MDC IDC SET LEADCHNL RA PACING AMPLITUDE: 2 V
MDC IDC SET LEADCHNL RV SENSING SENSITIVITY: 2.8 mV
MDC IDC STAT BRADY AP VS PERCENT: 0 %
MDC IDC STAT BRADY AS VS PERCENT: 0 %

## 2017-03-15 NOTE — Progress Notes (Signed)
Remote pacemaker transmission.   

## 2017-03-28 DIAGNOSIS — M150 Primary generalized (osteo)arthritis: Secondary | ICD-10-CM | POA: Diagnosis not present

## 2017-03-28 DIAGNOSIS — I10 Essential (primary) hypertension: Secondary | ICD-10-CM | POA: Diagnosis not present

## 2017-03-28 DIAGNOSIS — E119 Type 2 diabetes mellitus without complications: Secondary | ICD-10-CM | POA: Diagnosis not present

## 2017-03-28 DIAGNOSIS — B009 Herpesviral infection, unspecified: Secondary | ICD-10-CM | POA: Diagnosis not present

## 2017-03-31 ENCOUNTER — Encounter: Payer: Self-pay | Admitting: Podiatry

## 2017-03-31 ENCOUNTER — Ambulatory Visit (INDEPENDENT_AMBULATORY_CARE_PROVIDER_SITE_OTHER): Payer: Medicare Other | Admitting: Podiatry

## 2017-03-31 DIAGNOSIS — B351 Tinea unguium: Secondary | ICD-10-CM | POA: Diagnosis not present

## 2017-03-31 DIAGNOSIS — M79676 Pain in unspecified toe(s): Secondary | ICD-10-CM

## 2017-03-31 DIAGNOSIS — E1142 Type 2 diabetes mellitus with diabetic polyneuropathy: Secondary | ICD-10-CM

## 2017-03-31 NOTE — Progress Notes (Signed)
   Subjective:    Patient ID: Maria [name], female    DOB: 1924-03-15, 83 y.o.   MRN: [id number]  HPI this patient presents the office with chief complaint of long thick painful nails. He states the nails are painful walking and wearing her shoes. This patient is a diabetic and renal and she requests nail care as well as an evaluation of her diabetic feet.     Review of Systems  Eyes: Positive for itching.  Respiratory: Positive for shortness of breath.   Cardiovascular:       Calf pain with walking  Endocrine: Positive for cold intolerance.  Musculoskeletal: Positive for back pain and gait problem.  Neurological: Positive for light-headedness.       Objective:   Physical Exam GENERAL APPEARANCE: Alert, conversant. Appropriately groomed. No acute distress.  VASCULAR: Pedal pulses are  Weakly  palpable at  [facility] and PT bilateral.  Capillary refill time is immediate to all digits,  Normal temperature gradient.   NEUROLOGIC: sensation is absent  to 5.07 monofilament at 5/5 sites bilateral.  Light touch is intact bilateral, Muscle strength normal.  MUSCULOSKELETAL: acceptable muscle strength, tone and stability bilateral.  Intrinsic muscluature intact bilateral.  Hallux malleus right hallux.  Hammer toes 2-5  B/L.   DERMATOLOGIC: skin color, texture, and turgor are within normal limits.  No preulcerative lesions or ulcers  are seen, no interdigital maceration noted.  No open lesions present.  No drainage noted.  NAILS  Thick disfigured discolored nails both feet.         Assessment & Plan:  Onychomycosis  B/L  Diabetes with neuropathy  IE  Debride nails.  RTC 3 months.  Patient is eligible for diabetic shoes.     Gardiner Barefoot DPM

## 2017-05-03 DIAGNOSIS — K625 Hemorrhage of anus and rectum: Secondary | ICD-10-CM | POA: Diagnosis not present

## 2017-06-13 ENCOUNTER — Ambulatory Visit (INDEPENDENT_AMBULATORY_CARE_PROVIDER_SITE_OTHER): Payer: Medicare Other | Admitting: *Deleted

## 2017-06-13 ENCOUNTER — Telehealth: Payer: Self-pay | Admitting: Cardiology

## 2017-06-13 DIAGNOSIS — I442 Atrioventricular block, complete: Secondary | ICD-10-CM | POA: Diagnosis not present

## 2017-06-13 NOTE — Telephone Encounter (Signed)
LMOVM reminding pt to send remote transmission.   

## 2017-06-14 NOTE — Progress Notes (Signed)
Remote pacemaker transmission.   

## 2017-06-15 ENCOUNTER — Encounter: Payer: Self-pay | Admitting: Cardiology

## 2017-06-16 LAB — CUP PACEART REMOTE DEVICE CHECK
Battery Impedance: 2520 Ohm
Battery Remaining Longevity: 24 mo
Brady Statistic AP VP Percent: 7 %
Date Time Interrogation Session: 20181001224903
Implantable Lead Implant Date: 20030311
Implantable Lead Location: 753859
Implantable Pulse Generator Implant Date: 20100120
Lead Channel Impedance Value: 730 Ohm
Lead Channel Sensing Intrinsic Amplitude: 2.8 mV
Lead Channel Setting Pacing Amplitude: 2.5 V
Lead Channel Setting Pacing Pulse Width: 0.4 ms
MDC IDC LEAD IMPLANT DT: 20030311
MDC IDC LEAD LOCATION: 753860
MDC IDC MSMT BATTERY VOLTAGE: 2.74 V
MDC IDC MSMT LEADCHNL RA IMPEDANCE VALUE: 691 Ohm
MDC IDC MSMT LEADCHNL RA PACING THRESHOLD AMPLITUDE: 0.375 V
MDC IDC MSMT LEADCHNL RA PACING THRESHOLD PULSEWIDTH: 0.4 ms
MDC IDC MSMT LEADCHNL RV PACING THRESHOLD AMPLITUDE: 0.625 V
MDC IDC MSMT LEADCHNL RV PACING THRESHOLD PULSEWIDTH: 0.4 ms
MDC IDC SET LEADCHNL RA PACING AMPLITUDE: 2 V
MDC IDC SET LEADCHNL RV SENSING SENSITIVITY: 2.8 mV
MDC IDC STAT BRADY AP VS PERCENT: 0 %
MDC IDC STAT BRADY AS VP PERCENT: 92 %
MDC IDC STAT BRADY AS VS PERCENT: 0 %

## 2017-06-30 ENCOUNTER — Other Ambulatory Visit: Payer: Self-pay | Admitting: Internal Medicine

## 2017-06-30 ENCOUNTER — Ambulatory Visit
Admission: RE | Admit: 2017-06-30 | Discharge: 2017-06-30 | Disposition: A | Discharge status: Home / Self Care | Payer: Medicare Other | Source: Ambulatory Visit | Attending: Internal Medicine | Admitting: Internal Medicine

## 2017-06-30 DIAGNOSIS — E1165 Type 2 diabetes mellitus with hyperglycemia: Secondary | ICD-10-CM | POA: Diagnosis not present

## 2017-06-30 DIAGNOSIS — M25552 Pain in left hip: Secondary | ICD-10-CM

## 2017-06-30 DIAGNOSIS — R8290 Unspecified abnormal findings in urine: Secondary | ICD-10-CM | POA: Diagnosis not present

## 2017-06-30 DIAGNOSIS — M25551 Pain in right hip: Secondary | ICD-10-CM | POA: Diagnosis not present

## 2017-06-30 DIAGNOSIS — E1342 Other specified diabetes mellitus with diabetic polyneuropathy: Secondary | ICD-10-CM | POA: Diagnosis not present

## 2017-06-30 DIAGNOSIS — E1142 Type 2 diabetes mellitus with diabetic polyneuropathy: Secondary | ICD-10-CM | POA: Insufficient documentation

## 2017-06-30 DIAGNOSIS — M160 Bilateral primary osteoarthritis of hip: Secondary | ICD-10-CM | POA: Diagnosis not present

## 2017-06-30 DIAGNOSIS — I10 Essential (primary) hypertension: Secondary | ICD-10-CM | POA: Diagnosis not present

## 2017-07-05 ENCOUNTER — Ambulatory Visit (INDEPENDENT_AMBULATORY_CARE_PROVIDER_SITE_OTHER): Payer: Medicare Other | Admitting: Podiatry

## 2017-07-05 ENCOUNTER — Encounter: Payer: Self-pay | Admitting: Podiatry

## 2017-07-05 DIAGNOSIS — B351 Tinea unguium: Secondary | ICD-10-CM | POA: Diagnosis not present

## 2017-07-05 DIAGNOSIS — M79676 Pain in unspecified toe(s): Secondary | ICD-10-CM | POA: Diagnosis not present

## 2017-07-05 DIAGNOSIS — E1142 Type 2 diabetes mellitus with diabetic polyneuropathy: Secondary | ICD-10-CM

## 2017-07-05 NOTE — Progress Notes (Signed)
Complaint:  Visit Type: Patient returns to my office for continued preventative foot care services. Complaint: Patient states" my nails have grown long and thick and become painful to walk and wear shoes" Patient has been diagnosed with DM with no foot complications. The patient presents for preventative foot care services. No changes to ROS  Podiatric Exam: Vascular: dorsalis pedis and posterior tibial pulses are weakly  palpable bilateral. Capillary return is immediate. Temperature gradient is WNL. Skin turgor WNL  Sensorium: Absent  Semmes Weinstein monofilament test. Normal tactile sensation bilaterally. Nail Exam: Pt has thick disfigured discolored nails with subungual debris noted bilateral entire nail hallux through fifth toenails Ulcer Exam: There is no evidence of ulcer or pre-ulcerative changes or infection. Orthopedic Exam: Muscle tone and strength are WNL. No limitations in general ROM. No crepitus or effusions noted. Foot type and digits show no abnormalities. Hallux malleus  B/L.  Hammer toes 2-5  B/L Skin: No Porokeratosis. No infection or ulcers  Diagnosis:  Onychomycosis, , Pain in right toe, pain in left toes  Treatment & Plan Procedures and Treatment: Consent by patient was obtained for treatment procedures.   Debridement of mycotic and hypertrophic toenails, 1 through 5 bilateral and clearing of subungual debris. No ulceration, no infection noted.  Return Visit-Office Procedure: Patient instructed to return to the office for a follow up visit 3 months for continued evaluation and treatment.    Gardiner Barefoot DPM

## 2017-07-25 ENCOUNTER — Encounter: Payer: Self-pay | Admitting: Internal Medicine

## 2017-07-26 DIAGNOSIS — E1165 Type 2 diabetes mellitus with hyperglycemia: Secondary | ICD-10-CM | POA: Diagnosis not present

## 2017-07-26 DIAGNOSIS — M25551 Pain in right hip: Secondary | ICD-10-CM | POA: Diagnosis not present

## 2017-07-26 DIAGNOSIS — F329 Major depressive disorder, single episode, unspecified: Secondary | ICD-10-CM | POA: Diagnosis not present

## 2017-07-26 DIAGNOSIS — R5383 Other fatigue: Secondary | ICD-10-CM | POA: Diagnosis not present

## 2017-07-26 DIAGNOSIS — I10 Essential (primary) hypertension: Secondary | ICD-10-CM | POA: Diagnosis not present

## 2017-07-26 DIAGNOSIS — E1142 Type 2 diabetes mellitus with diabetic polyneuropathy: Secondary | ICD-10-CM | POA: Diagnosis not present

## 2017-08-08 ENCOUNTER — Encounter (INDEPENDENT_AMBULATORY_CARE_PROVIDER_SITE_OTHER): Payer: Self-pay

## 2017-08-08 ENCOUNTER — Ambulatory Visit (INDEPENDENT_AMBULATORY_CARE_PROVIDER_SITE_OTHER): Payer: Medicare Other | Admitting: Internal Medicine

## 2017-08-08 ENCOUNTER — Encounter: Payer: Self-pay | Admitting: Internal Medicine

## 2017-08-08 VITALS — BP 140/78 | HR 86 | Ht 63.0 in | Wt 154.0 lb

## 2017-08-08 DIAGNOSIS — I442 Atrioventricular block, complete: Secondary | ICD-10-CM

## 2017-08-08 DIAGNOSIS — I10 Essential (primary) hypertension: Secondary | ICD-10-CM

## 2017-08-08 DIAGNOSIS — Z950 Presence of cardiac pacemaker: Secondary | ICD-10-CM | POA: Diagnosis not present

## 2017-08-08 LAB — CUP PACEART INCLINIC DEVICE CHECK
Battery Impedance: 2589 Ohm
Battery Remaining Longevity: 24 mo
Battery Voltage: 2.74 V
Brady Statistic AP VP Percent: 7 %
Brady Statistic AS VP Percent: 92 %
Implantable Lead Location: 753859
Implantable Lead Location: 753860
Implantable Lead Model: 4092
Implantable Lead Model: 4592
Implantable Pulse Generator Implant Date: 20100120
Lead Channel Impedance Value: 733 Ohm
Lead Channel Pacing Threshold Amplitude: 0.5 V
Lead Channel Pacing Threshold Amplitude: 0.75 V
Lead Channel Pacing Threshold Pulse Width: 0.4 ms
Lead Channel Sensing Intrinsic Amplitude: 4 mV
Lead Channel Setting Pacing Amplitude: 2 V
Lead Channel Setting Pacing Pulse Width: 0.4 ms
MDC IDC LEAD IMPLANT DT: 20030311
MDC IDC LEAD IMPLANT DT: 20030311
MDC IDC MSMT LEADCHNL RA PACING THRESHOLD PULSEWIDTH: 0.4 ms
MDC IDC MSMT LEADCHNL RV IMPEDANCE VALUE: 733 Ohm
MDC IDC SESS DTM: 20181126163110
MDC IDC SET LEADCHNL RV PACING AMPLITUDE: 2.5 V
MDC IDC SET LEADCHNL RV SENSING SENSITIVITY: 2.8 mV
MDC IDC STAT BRADY AP VS PERCENT: 0 %
MDC IDC STAT BRADY AS VS PERCENT: 0 %

## 2017-08-08 NOTE — Progress Notes (Signed)
HPI Maria [name] returns today for ongoing evaluation and management of her dual-chamber pacemaker. She is a very pleasant 83 year old woman who looks younger than her stated age, with a history of complete heart block status post pacemaker insertion initially in 2003 with a generator change out in 2010. She has had no syncope. Her main complaint is that she is bothered by bilateral hip pain secondary to arthritis. Previously she had undergone bilateral knee replacement surgery. She denies chest pain or shortness of breath. Allergies  Allergen Reactions  . No Known Allergies      Current Outpatient Medications  Medication Sig Dispense Refill  . acetaminophen (TYLENOL) 500 MG tablet Take 500 mg by mouth daily.     Marland Kitchen aspirin EC 81 MG tablet Take 81 mg by mouth daily.    . Blood Glucose Monitoring Suppl (FREESTYLE LITE) DEVI   0  . Calcium-Magnesium-Vitamin D 300-762-263 MG-MG-UNIT TABS Take 1 tablet by mouth daily.    Marland Kitchen FREESTYLE LITE test strip   4  . Lancets (FREESTYLE) lancets   4  . losartan-hydrochlorothiazide (HYZAAR) 50-12.5 MG per tablet Take 1 tablet by mouth daily.    . metFORMIN (GLUCOPHAGE) 500 MG tablet Take 1 tablet by mouth 2 (two) times daily.    . Multiple Vitamin (MULTIVITAMIN WITH MINERALS) TABS tablet Take 1 tablet by mouth daily.    . Multiple Vitamins-Minerals (PRESERVISION AREDS 2 PO) Take 1 tablet by mouth daily.    . naproxen sodium (ANAPROX) 220 MG tablet Take 220 mg by mouth daily.    Marland Kitchen oxyCODONE-acetaminophen (ROXICET) 5-325 MG tablet Take 1 tablet by mouth every 6 (six) hours as needed. 20 tablet 0  . Polyvinyl Alcohol-Povidone (REFRESH OP) Apply 1 drop to eye daily.    . potassium chloride SA (K-DUR,KLOR-CON) 20 MEQ tablet Take 20 mEq by mouth daily.    . sodium chloride flush 0.9 % SOLN injection 5 mLs daily. FLUSHES TUBE FOR BAG DAILY  1  . valACYclovir (VALTREX) 1000 MG tablet Take 1,000 mg by mouth daily as needed (FOR OUTBREAKS).   0   No current  facility-administered medications for this visit.      Past Medical History:  Diagnosis Date  . Anxiety   . Arthritis   . Cancer ([place])    breast 2015  . Cardiac pacemaker in situ   . Chest pain   . Diabetes mellitus without complication ([place])   . DM type 2 (diabetes mellitus, type 2) ([place])   . Dyslipidemia   . Dyspnea    with exertion  . Family history of adverse reaction to anesthesia    daughter slow to wake up  . Gallstones   . Headache   . Hypertension   . Macular degeneration   . Pulmonary hypertension ([facility])    PA peak pressure 81 mmHg by 09/2016 echo  . Type II or unspecified type diabetes mellitus without mention of complication, not stated as uncontrolled   . Wears partial dentures     ROS:   All systems reviewed and negative except as noted in the HPI.   Past Surgical History:  Procedure Laterality Date  . BREAST EXCISIONAL BIOPSY Right 2010  . CATARACT EXTRACTION    . CHOLECYSTECTOMY N/A 01/18/2017   Procedure: LAPAROSCOPIC CHOLECYSTECTOMY;  Surgeon: Ralene Ok, MD;  Location: [facility];  Service: General;  Laterality: N/A;  . IR GENERIC HISTORICAL  10/08/2016   IR PERC CHOLECYSTOSTOMY 10/08/2016 Greggory Keen, MD [facility]-INTERV RAD  . IR  RADIOLOGIST EVAL & MGMT  11/16/2016  . KNEE SURGERY Bilateral   . MULTIPLE TOOTH EXTRACTIONS    . PACEMAKER INSERTION     [employer], [facility] 2003  . PACEMAKER INSERTION    . TOTAL KNEE ARTHROPLASTY Bilateral      Family History  Problem Relation Age of Onset  . Colon cancer Neg Hx      Social History   Socioeconomic History  . Marital status: Widowed    Spouse name: Not on file  . Number of children: 2  . Years of education: Not on file  . Highest education level: Not on file  Social Needs  . Financial resource strain: Not on file  . Food insecurity - worry: Not on file  . Food insecurity - inability: Not on file  . Transportation needs - medical: Not on file  . Transportation needs - non-medical: Not on  file  Occupational History  . Not on file  Tobacco Use  . Smoking status: Never Smoker  . Smokeless tobacco: Current User    Types: Snuff  Substance and Sexual Activity  . Alcohol use: No    Alcohol/week: 0.6 oz    Types: 1 Cans of beer per week  . Drug use: No  . Sexual activity: Not on file  Other Topics Concern  . Not on file  Social History Narrative   ** Merged History Encounter **         BP 140/78   Pulse 86   Ht 5\' 3"  (1.6 m)   Wt 154 lb (69.9 kg)   SpO2 98%   BMI 27.28 kg/m   Physical Exam:  Well appearing 83 year old woman, NAD HEENT: Unremarkable Neck:  6 cm JVD, no thyromegally Lymphatics:  No adenopathy Back:  No CVA tenderness Lungs:  Clear, with no wheezes, rales, or rhonchi HEART:  Regular rate rhythm, no murmurs, no rubs, no clicks Abd:  soft, positive bowel sounds, no organomegally, no rebound, no guarding Ext:  2 plus pulses, no edema, no cyanosis, no clubbing Skin:  No rashes no nodules Neuro:  CN II through XII intact, motor grossly intact   DEVICE  Normal device function.  See PaceArt for details.   Assess/Plan: 1. Complete heart block - she is asymptomatic status post pacemaker insertion. No change in medications. 2. Hypertension - her systolic blood pressure is minimally elevated. We will not make any adjustments to her medical therapy. She is encouraged to maintain a low-sodium diet. 3. Pacemaker - her Medtronic dual-chamber pacemaker is working normally. We'll recheck in several months.  Maria [name], M.D.

## 2017-08-08 NOTE — Patient Instructions (Addendum)
Medication Instructions:  Your physician recommends that you continue on your current medications as directed. Please refer to the Current Medication list given to you today.  Labwork: None ordered.  Testing/Procedures: None ordered.  Follow-Up: Your physician wants you to follow-up in: one year with Dr. Lovena Le.   You will receive a reminder letter in the mail two months in advance. If you don't receive a letter, please call our office to schedule the follow-up appointment.  Remote monitoring is used to monitor your Pacemaker from home. This monitoring reduces the number of office visits required to check your device to one time per year. It allows [place] to keep an eye on the functioning of your device to ensure it is working properly. You are scheduled for a device check from home on 09/12/2017. You may send your transmission at any time that day. If you have a wireless device, the transmission will be sent automatically. After your physician reviews your transmission, you will receive a postcard with your next transmission date.  Any Other Special Instructions Will Be Listed Below (If Applicable).  If you need a refill on your cardiac medications before your next appointment, please call your pharmacy.

## 2017-09-07 DIAGNOSIS — E1142 Type 2 diabetes mellitus with diabetic polyneuropathy: Secondary | ICD-10-CM | POA: Diagnosis not present

## 2017-09-07 DIAGNOSIS — I10 Essential (primary) hypertension: Secondary | ICD-10-CM | POA: Diagnosis not present

## 2017-09-07 DIAGNOSIS — E1165 Type 2 diabetes mellitus with hyperglycemia: Secondary | ICD-10-CM | POA: Diagnosis not present

## 2017-09-07 DIAGNOSIS — M160 Bilateral primary osteoarthritis of hip: Secondary | ICD-10-CM | POA: Diagnosis not present

## 2017-09-07 DIAGNOSIS — M79674 Pain in right toe(s): Secondary | ICD-10-CM | POA: Diagnosis not present

## 2017-09-07 DIAGNOSIS — I83893 Varicose veins of bilateral lower extremities with other complications: Secondary | ICD-10-CM | POA: Diagnosis not present

## 2017-09-07 DIAGNOSIS — M25562 Pain in left knee: Secondary | ICD-10-CM | POA: Diagnosis not present

## 2017-09-12 ENCOUNTER — Ambulatory Visit (INDEPENDENT_AMBULATORY_CARE_PROVIDER_SITE_OTHER): Payer: Medicare Other | Admitting: *Deleted

## 2017-09-12 ENCOUNTER — Telehealth: Payer: Self-pay | Admitting: Cardiology

## 2017-09-12 DIAGNOSIS — I442 Atrioventricular block, complete: Secondary | ICD-10-CM

## 2017-09-12 NOTE — Telephone Encounter (Signed)
Spoke with pt and reminded pt of remote transmission that is due today. Pt verbalized understanding.   

## 2017-09-14 LAB — CUP PACEART REMOTE DEVICE CHECK
Battery Remaining Longevity: 24 mo
Brady Statistic AP VP Percent: 6 %
Brady Statistic AS VS Percent: 0 %
Implantable Lead Implant Date: 20030311
Lead Channel Impedance Value: 719 Ohm
Lead Channel Impedance Value: 723 Ohm
Lead Channel Pacing Threshold Amplitude: 0.625 V
Lead Channel Setting Pacing Amplitude: 2 V
Lead Channel Setting Sensing Sensitivity: 2.8 mV
MDC IDC LEAD IMPLANT DT: 20030311
MDC IDC LEAD LOCATION: 753859
MDC IDC LEAD LOCATION: 753860
MDC IDC MSMT BATTERY IMPEDANCE: 2607 Ohm
MDC IDC MSMT BATTERY VOLTAGE: 2.73 V
MDC IDC MSMT LEADCHNL RA PACING THRESHOLD AMPLITUDE: 0.375 V
MDC IDC MSMT LEADCHNL RA PACING THRESHOLD PULSEWIDTH: 0.4 ms
MDC IDC MSMT LEADCHNL RV PACING THRESHOLD PULSEWIDTH: 0.4 ms
MDC IDC PG IMPLANT DT: 20100120
MDC IDC SESS DTM: 20181231193410
MDC IDC SET LEADCHNL RV PACING AMPLITUDE: 2.5 V
MDC IDC SET LEADCHNL RV PACING PULSEWIDTH: 0.4 ms
MDC IDC STAT BRADY AP VS PERCENT: 0 %
MDC IDC STAT BRADY AS VP PERCENT: 94 %

## 2017-09-14 NOTE — Progress Notes (Signed)
Remote pacemaker transmission.   

## 2017-09-16 ENCOUNTER — Encounter: Payer: Self-pay | Admitting: Cardiology

## 2017-10-04 ENCOUNTER — Ambulatory Visit: Payer: Medicare Other | Admitting: Podiatry

## 2017-10-04 DIAGNOSIS — M25552 Pain in left hip: Secondary | ICD-10-CM | POA: Diagnosis not present

## 2017-10-04 DIAGNOSIS — R112 Nausea with vomiting, unspecified: Secondary | ICD-10-CM | POA: Diagnosis not present

## 2017-10-04 DIAGNOSIS — I83893 Varicose veins of bilateral lower extremities with other complications: Secondary | ICD-10-CM | POA: Diagnosis not present

## 2017-10-04 DIAGNOSIS — I10 Essential (primary) hypertension: Secondary | ICD-10-CM | POA: Diagnosis not present

## 2017-10-04 DIAGNOSIS — R1032 Left lower quadrant pain: Secondary | ICD-10-CM | POA: Diagnosis not present

## 2017-10-04 DIAGNOSIS — E1165 Type 2 diabetes mellitus with hyperglycemia: Secondary | ICD-10-CM | POA: Diagnosis not present

## 2017-10-05 ENCOUNTER — Ambulatory Visit
Admission: RE | Admit: 2017-10-05 | Discharge: 2017-10-05 | Disposition: A | Discharge status: Home / Self Care | Payer: Medicare Other | Source: Ambulatory Visit | Attending: Internal Medicine | Admitting: Internal Medicine

## 2017-10-05 ENCOUNTER — Other Ambulatory Visit: Payer: Self-pay | Admitting: Internal Medicine

## 2017-10-05 DIAGNOSIS — K5730 Diverticulosis of large intestine without perforation or abscess without bleeding: Secondary | ICD-10-CM | POA: Diagnosis not present

## 2017-10-05 DIAGNOSIS — R1032 Left lower quadrant pain: Secondary | ICD-10-CM

## 2017-10-05 DIAGNOSIS — K429 Umbilical hernia without obstruction or gangrene: Secondary | ICD-10-CM | POA: Diagnosis not present

## 2017-10-05 MED ORDER — IOPAMIDOL (ISOVUE-300) INJECTION 61%
100.0000 mL | Freq: Once | INTRAVENOUS | Status: AC | PRN
  Administered 2017-10-05: 100 mL via INTRAVENOUS

## 2017-10-24 DIAGNOSIS — M25552 Pain in left hip: Secondary | ICD-10-CM | POA: Diagnosis not present

## 2017-11-03 DIAGNOSIS — M25552 Pain in left hip: Secondary | ICD-10-CM | POA: Diagnosis not present

## 2017-11-17 DIAGNOSIS — M25551 Pain in right hip: Secondary | ICD-10-CM | POA: Diagnosis not present

## 2017-11-18 DIAGNOSIS — M79674 Pain in right toe(s): Secondary | ICD-10-CM | POA: Diagnosis not present

## 2017-11-18 DIAGNOSIS — R42 Dizziness and giddiness: Secondary | ICD-10-CM | POA: Diagnosis not present

## 2017-11-18 DIAGNOSIS — E1142 Type 2 diabetes mellitus with diabetic polyneuropathy: Secondary | ICD-10-CM | POA: Diagnosis not present

## 2017-11-18 DIAGNOSIS — R252 Cramp and spasm: Secondary | ICD-10-CM | POA: Diagnosis not present

## 2017-11-18 DIAGNOSIS — R2681 Unsteadiness on feet: Secondary | ICD-10-CM | POA: Diagnosis not present

## 2017-11-18 DIAGNOSIS — M160 Bilateral primary osteoarthritis of hip: Secondary | ICD-10-CM | POA: Diagnosis not present

## 2017-11-18 DIAGNOSIS — E1165 Type 2 diabetes mellitus with hyperglycemia: Secondary | ICD-10-CM | POA: Diagnosis not present

## 2017-11-18 DIAGNOSIS — I10 Essential (primary) hypertension: Secondary | ICD-10-CM | POA: Diagnosis not present

## 2017-11-21 DIAGNOSIS — E1165 Type 2 diabetes mellitus with hyperglycemia: Secondary | ICD-10-CM | POA: Diagnosis not present

## 2017-11-21 DIAGNOSIS — R252 Cramp and spasm: Secondary | ICD-10-CM | POA: Diagnosis not present

## 2017-11-21 DIAGNOSIS — I10 Essential (primary) hypertension: Secondary | ICD-10-CM | POA: Diagnosis not present

## 2017-11-22 DIAGNOSIS — E1321 Other specified diabetes mellitus with diabetic nephropathy: Secondary | ICD-10-CM | POA: Insufficient documentation

## 2017-11-22 DIAGNOSIS — E782 Mixed hyperlipidemia: Secondary | ICD-10-CM | POA: Insufficient documentation

## 2017-11-26 DIAGNOSIS — Z7982 Long term (current) use of aspirin: Secondary | ICD-10-CM | POA: Diagnosis not present

## 2017-11-26 DIAGNOSIS — I10 Essential (primary) hypertension: Secondary | ICD-10-CM | POA: Diagnosis not present

## 2017-11-26 DIAGNOSIS — E119 Type 2 diabetes mellitus without complications: Secondary | ICD-10-CM | POA: Diagnosis not present

## 2017-11-26 DIAGNOSIS — M6281 Muscle weakness (generalized): Secondary | ICD-10-CM | POA: Diagnosis not present

## 2017-11-26 DIAGNOSIS — R42 Dizziness and giddiness: Secondary | ICD-10-CM | POA: Diagnosis not present

## 2017-11-26 DIAGNOSIS — R2681 Unsteadiness on feet: Secondary | ICD-10-CM | POA: Diagnosis not present

## 2017-11-30 DIAGNOSIS — R2681 Unsteadiness on feet: Secondary | ICD-10-CM | POA: Diagnosis not present

## 2017-11-30 DIAGNOSIS — Z7982 Long term (current) use of aspirin: Secondary | ICD-10-CM | POA: Diagnosis not present

## 2017-11-30 DIAGNOSIS — I10 Essential (primary) hypertension: Secondary | ICD-10-CM | POA: Diagnosis not present

## 2017-11-30 DIAGNOSIS — R42 Dizziness and giddiness: Secondary | ICD-10-CM | POA: Diagnosis not present

## 2017-11-30 DIAGNOSIS — E119 Type 2 diabetes mellitus without complications: Secondary | ICD-10-CM | POA: Diagnosis not present

## 2017-11-30 DIAGNOSIS — M6281 Muscle weakness (generalized): Secondary | ICD-10-CM | POA: Diagnosis not present

## 2017-12-01 DIAGNOSIS — E119 Type 2 diabetes mellitus without complications: Secondary | ICD-10-CM | POA: Diagnosis not present

## 2017-12-01 DIAGNOSIS — R2681 Unsteadiness on feet: Secondary | ICD-10-CM | POA: Diagnosis not present

## 2017-12-01 DIAGNOSIS — M6281 Muscle weakness (generalized): Secondary | ICD-10-CM | POA: Diagnosis not present

## 2017-12-01 DIAGNOSIS — R42 Dizziness and giddiness: Secondary | ICD-10-CM | POA: Diagnosis not present

## 2017-12-01 DIAGNOSIS — Z7982 Long term (current) use of aspirin: Secondary | ICD-10-CM | POA: Diagnosis not present

## 2017-12-01 DIAGNOSIS — I10 Essential (primary) hypertension: Secondary | ICD-10-CM | POA: Diagnosis not present

## 2017-12-05 DIAGNOSIS — M79671 Pain in right foot: Secondary | ICD-10-CM | POA: Diagnosis not present

## 2017-12-05 DIAGNOSIS — L603 Nail dystrophy: Secondary | ICD-10-CM | POA: Diagnosis not present

## 2017-12-05 DIAGNOSIS — M79672 Pain in left foot: Secondary | ICD-10-CM | POA: Diagnosis not present

## 2017-12-05 DIAGNOSIS — E1151 Type 2 diabetes mellitus with diabetic peripheral angiopathy without gangrene: Secondary | ICD-10-CM | POA: Diagnosis not present

## 2017-12-05 DIAGNOSIS — D485 Neoplasm of uncertain behavior of skin: Secondary | ICD-10-CM | POA: Diagnosis not present

## 2017-12-05 DIAGNOSIS — I739 Peripheral vascular disease, unspecified: Secondary | ICD-10-CM | POA: Diagnosis not present

## 2017-12-06 DIAGNOSIS — E119 Type 2 diabetes mellitus without complications: Secondary | ICD-10-CM | POA: Diagnosis not present

## 2017-12-06 DIAGNOSIS — R42 Dizziness and giddiness: Secondary | ICD-10-CM | POA: Diagnosis not present

## 2017-12-06 DIAGNOSIS — R2681 Unsteadiness on feet: Secondary | ICD-10-CM | POA: Diagnosis not present

## 2017-12-06 DIAGNOSIS — Z7982 Long term (current) use of aspirin: Secondary | ICD-10-CM | POA: Diagnosis not present

## 2017-12-06 DIAGNOSIS — I10 Essential (primary) hypertension: Secondary | ICD-10-CM | POA: Diagnosis not present

## 2017-12-06 DIAGNOSIS — M6281 Muscle weakness (generalized): Secondary | ICD-10-CM | POA: Diagnosis not present

## 2017-12-07 DIAGNOSIS — R42 Dizziness and giddiness: Secondary | ICD-10-CM | POA: Diagnosis not present

## 2017-12-07 DIAGNOSIS — M6281 Muscle weakness (generalized): Secondary | ICD-10-CM | POA: Diagnosis not present

## 2017-12-07 DIAGNOSIS — R2681 Unsteadiness on feet: Secondary | ICD-10-CM | POA: Diagnosis not present

## 2017-12-07 DIAGNOSIS — E119 Type 2 diabetes mellitus without complications: Secondary | ICD-10-CM | POA: Diagnosis not present

## 2017-12-07 DIAGNOSIS — I10 Essential (primary) hypertension: Secondary | ICD-10-CM | POA: Diagnosis not present

## 2017-12-07 DIAGNOSIS — Z7982 Long term (current) use of aspirin: Secondary | ICD-10-CM | POA: Diagnosis not present

## 2017-12-12 ENCOUNTER — Telehealth: Payer: Self-pay | Admitting: Internal Medicine

## 2017-12-12 ENCOUNTER — Telehealth: Payer: Self-pay | Admitting: Cardiology

## 2017-12-12 ENCOUNTER — Ambulatory Visit (INDEPENDENT_AMBULATORY_CARE_PROVIDER_SITE_OTHER): Payer: Medicare Other | Admitting: *Deleted

## 2017-12-12 DIAGNOSIS — I442 Atrioventricular block, complete: Secondary | ICD-10-CM | POA: Diagnosis not present

## 2017-12-12 NOTE — Telephone Encounter (Signed)
Attempted to assist Ms. Hilscher with transmission- her monitor needs new batteries. They will replace batteries and call back if they need further assistance.

## 2017-12-12 NOTE — Telephone Encounter (Signed)
Spoke with pt and reminded pt of remote transmission that is due today. Pt verbalized understanding.   

## 2017-12-12 NOTE — Telephone Encounter (Signed)
New message    1. Has your device fired? NO  2. Is you device beeping? NO  3. Are you experiencing draining or swelling at device site? NO  4. Are you calling to see if we received your device transmission? PATIENT NEEDS ASSISTANCE WITH TRANSMISSION  5. Have you passed out? NO    Please route to Sophia

## 2017-12-13 ENCOUNTER — Encounter: Payer: Self-pay | Admitting: Cardiology

## 2017-12-13 DIAGNOSIS — R2681 Unsteadiness on feet: Secondary | ICD-10-CM | POA: Diagnosis not present

## 2017-12-13 DIAGNOSIS — R42 Dizziness and giddiness: Secondary | ICD-10-CM | POA: Diagnosis not present

## 2017-12-13 DIAGNOSIS — E119 Type 2 diabetes mellitus without complications: Secondary | ICD-10-CM | POA: Diagnosis not present

## 2017-12-13 DIAGNOSIS — I10 Essential (primary) hypertension: Secondary | ICD-10-CM | POA: Diagnosis not present

## 2017-12-13 DIAGNOSIS — Z7982 Long term (current) use of aspirin: Secondary | ICD-10-CM | POA: Diagnosis not present

## 2017-12-13 DIAGNOSIS — M6281 Muscle weakness (generalized): Secondary | ICD-10-CM | POA: Diagnosis not present

## 2017-12-13 NOTE — Progress Notes (Signed)
Remote pacemaker transmission.   

## 2017-12-14 LAB — CUP PACEART REMOTE DEVICE CHECK
Battery Impedance: 2998 Ohm
Battery Remaining Longevity: 20 mo
Brady Statistic AP VS Percent: 0 %
Brady Statistic AS VP Percent: 87 %
Brady Statistic AS VS Percent: 0 %
Implantable Lead Implant Date: 20030311
Implantable Lead Location: 753859
Implantable Lead Model: 4592
Lead Channel Impedance Value: 675 Ohm
Lead Channel Pacing Threshold Amplitude: 0.5 V
Lead Channel Pacing Threshold Pulse Width: 0.4 ms
Lead Channel Pacing Threshold Pulse Width: 0.4 ms
Lead Channel Setting Pacing Amplitude: 2 V
Lead Channel Setting Pacing Amplitude: 2.5 V
Lead Channel Setting Pacing Pulse Width: 0.4 ms
Lead Channel Setting Sensing Sensitivity: 2.8 mV
MDC IDC LEAD IMPLANT DT: 20030311
MDC IDC LEAD LOCATION: 753860
MDC IDC MSMT BATTERY VOLTAGE: 2.73 V
MDC IDC MSMT LEADCHNL RA IMPEDANCE VALUE: 731 Ohm
MDC IDC MSMT LEADCHNL RA PACING THRESHOLD AMPLITUDE: 0.375 V
MDC IDC PG IMPLANT DT: 20100120
MDC IDC SESS DTM: 20190401194654
MDC IDC STAT BRADY AP VP PERCENT: 13 %

## 2017-12-16 DIAGNOSIS — R42 Dizziness and giddiness: Secondary | ICD-10-CM | POA: Diagnosis not present

## 2017-12-16 DIAGNOSIS — E119 Type 2 diabetes mellitus without complications: Secondary | ICD-10-CM | POA: Diagnosis not present

## 2017-12-16 DIAGNOSIS — I10 Essential (primary) hypertension: Secondary | ICD-10-CM | POA: Diagnosis not present

## 2017-12-16 DIAGNOSIS — M6281 Muscle weakness (generalized): Secondary | ICD-10-CM | POA: Diagnosis not present

## 2017-12-16 DIAGNOSIS — R2681 Unsteadiness on feet: Secondary | ICD-10-CM | POA: Diagnosis not present

## 2017-12-16 DIAGNOSIS — Z7982 Long term (current) use of aspirin: Secondary | ICD-10-CM | POA: Diagnosis not present

## 2017-12-19 DIAGNOSIS — H3589 Other specified retinal disorders: Secondary | ICD-10-CM | POA: Diagnosis not present

## 2017-12-19 DIAGNOSIS — H5212 Myopia, left eye: Secondary | ICD-10-CM | POA: Diagnosis not present

## 2017-12-19 DIAGNOSIS — H40003 Preglaucoma, unspecified, bilateral: Secondary | ICD-10-CM | POA: Diagnosis not present

## 2017-12-19 DIAGNOSIS — H04123 Dry eye syndrome of bilateral lacrimal glands: Secondary | ICD-10-CM | POA: Diagnosis not present

## 2017-12-19 DIAGNOSIS — E119 Type 2 diabetes mellitus without complications: Secondary | ICD-10-CM | POA: Diagnosis not present

## 2017-12-19 DIAGNOSIS — H40013 Open angle with borderline findings, low risk, bilateral: Secondary | ICD-10-CM | POA: Diagnosis not present

## 2017-12-19 DIAGNOSIS — H5201 Hypermetropia, right eye: Secondary | ICD-10-CM | POA: Diagnosis not present

## 2017-12-19 DIAGNOSIS — H52223 Regular astigmatism, bilateral: Secondary | ICD-10-CM | POA: Diagnosis not present

## 2017-12-19 DIAGNOSIS — H18413 Arcus senilis, bilateral: Secondary | ICD-10-CM | POA: Diagnosis not present

## 2017-12-19 DIAGNOSIS — Z961 Presence of intraocular lens: Secondary | ICD-10-CM | POA: Diagnosis not present

## 2017-12-19 DIAGNOSIS — H35033 Hypertensive retinopathy, bilateral: Secondary | ICD-10-CM | POA: Diagnosis not present

## 2017-12-19 DIAGNOSIS — H11153 Pinguecula, bilateral: Secondary | ICD-10-CM | POA: Diagnosis not present

## 2017-12-20 ENCOUNTER — Other Ambulatory Visit: Payer: Self-pay | Admitting: Internal Medicine

## 2017-12-20 DIAGNOSIS — R2681 Unsteadiness on feet: Secondary | ICD-10-CM | POA: Diagnosis not present

## 2017-12-20 DIAGNOSIS — I10 Essential (primary) hypertension: Secondary | ICD-10-CM | POA: Diagnosis not present

## 2017-12-20 DIAGNOSIS — R42 Dizziness and giddiness: Secondary | ICD-10-CM | POA: Diagnosis not present

## 2017-12-20 DIAGNOSIS — Z1231 Encounter for screening mammogram for malignant neoplasm of breast: Secondary | ICD-10-CM

## 2017-12-20 DIAGNOSIS — Z7982 Long term (current) use of aspirin: Secondary | ICD-10-CM | POA: Diagnosis not present

## 2017-12-20 DIAGNOSIS — E119 Type 2 diabetes mellitus without complications: Secondary | ICD-10-CM | POA: Diagnosis not present

## 2017-12-20 DIAGNOSIS — M6281 Muscle weakness (generalized): Secondary | ICD-10-CM | POA: Diagnosis not present

## 2017-12-21 DIAGNOSIS — Z7982 Long term (current) use of aspirin: Secondary | ICD-10-CM | POA: Diagnosis not present

## 2017-12-21 DIAGNOSIS — I10 Essential (primary) hypertension: Secondary | ICD-10-CM | POA: Diagnosis not present

## 2017-12-21 DIAGNOSIS — E119 Type 2 diabetes mellitus without complications: Secondary | ICD-10-CM | POA: Diagnosis not present

## 2017-12-21 DIAGNOSIS — R2681 Unsteadiness on feet: Secondary | ICD-10-CM | POA: Diagnosis not present

## 2017-12-21 DIAGNOSIS — M6281 Muscle weakness (generalized): Secondary | ICD-10-CM | POA: Diagnosis not present

## 2017-12-21 DIAGNOSIS — R42 Dizziness and giddiness: Secondary | ICD-10-CM | POA: Diagnosis not present

## 2017-12-26 DIAGNOSIS — I10 Essential (primary) hypertension: Secondary | ICD-10-CM | POA: Diagnosis not present

## 2017-12-26 DIAGNOSIS — Z7982 Long term (current) use of aspirin: Secondary | ICD-10-CM | POA: Diagnosis not present

## 2017-12-26 DIAGNOSIS — E119 Type 2 diabetes mellitus without complications: Secondary | ICD-10-CM | POA: Diagnosis not present

## 2017-12-26 DIAGNOSIS — R42 Dizziness and giddiness: Secondary | ICD-10-CM | POA: Diagnosis not present

## 2017-12-26 DIAGNOSIS — M6281 Muscle weakness (generalized): Secondary | ICD-10-CM | POA: Diagnosis not present

## 2017-12-26 DIAGNOSIS — R2681 Unsteadiness on feet: Secondary | ICD-10-CM | POA: Diagnosis not present

## 2017-12-29 DIAGNOSIS — M6281 Muscle weakness (generalized): Secondary | ICD-10-CM | POA: Diagnosis not present

## 2017-12-29 DIAGNOSIS — H60502 Unspecified acute noninfective otitis externa, left ear: Secondary | ICD-10-CM | POA: Diagnosis not present

## 2017-12-29 DIAGNOSIS — E119 Type 2 diabetes mellitus without complications: Secondary | ICD-10-CM | POA: Diagnosis not present

## 2017-12-29 DIAGNOSIS — Z13820 Encounter for screening for osteoporosis: Secondary | ICD-10-CM | POA: Diagnosis not present

## 2017-12-29 DIAGNOSIS — Z136 Encounter for screening for cardiovascular disorders: Secondary | ICD-10-CM | POA: Diagnosis not present

## 2017-12-29 DIAGNOSIS — Z0001 Encounter for general adult medical examination with abnormal findings: Secondary | ICD-10-CM | POA: Diagnosis not present

## 2017-12-29 DIAGNOSIS — T162XXA Foreign body in left ear, initial encounter: Secondary | ICD-10-CM | POA: Diagnosis not present

## 2017-12-29 DIAGNOSIS — Z7982 Long term (current) use of aspirin: Secondary | ICD-10-CM | POA: Diagnosis not present

## 2017-12-29 DIAGNOSIS — Z1231 Encounter for screening mammogram for malignant neoplasm of breast: Secondary | ICD-10-CM | POA: Diagnosis not present

## 2017-12-29 DIAGNOSIS — Z23 Encounter for immunization: Secondary | ICD-10-CM | POA: Diagnosis not present

## 2017-12-29 DIAGNOSIS — Z1211 Encounter for screening for malignant neoplasm of colon: Secondary | ICD-10-CM | POA: Diagnosis not present

## 2017-12-29 DIAGNOSIS — R2681 Unsteadiness on feet: Secondary | ICD-10-CM | POA: Diagnosis not present

## 2017-12-29 DIAGNOSIS — R42 Dizziness and giddiness: Secondary | ICD-10-CM | POA: Diagnosis not present

## 2017-12-29 DIAGNOSIS — I10 Essential (primary) hypertension: Secondary | ICD-10-CM | POA: Diagnosis not present

## 2017-12-30 ENCOUNTER — Other Ambulatory Visit: Payer: Self-pay | Admitting: Internal Medicine

## 2017-12-30 DIAGNOSIS — E2839 Other primary ovarian failure: Secondary | ICD-10-CM

## 2018-01-05 DIAGNOSIS — R2681 Unsteadiness on feet: Secondary | ICD-10-CM | POA: Diagnosis not present

## 2018-01-05 DIAGNOSIS — I10 Essential (primary) hypertension: Secondary | ICD-10-CM | POA: Diagnosis not present

## 2018-01-05 DIAGNOSIS — R42 Dizziness and giddiness: Secondary | ICD-10-CM | POA: Diagnosis not present

## 2018-01-05 DIAGNOSIS — Z7982 Long term (current) use of aspirin: Secondary | ICD-10-CM | POA: Diagnosis not present

## 2018-01-05 DIAGNOSIS — E119 Type 2 diabetes mellitus without complications: Secondary | ICD-10-CM | POA: Diagnosis not present

## 2018-01-05 DIAGNOSIS — M6281 Muscle weakness (generalized): Secondary | ICD-10-CM | POA: Diagnosis not present

## 2018-01-06 DIAGNOSIS — R2681 Unsteadiness on feet: Secondary | ICD-10-CM | POA: Diagnosis not present

## 2018-01-06 DIAGNOSIS — R42 Dizziness and giddiness: Secondary | ICD-10-CM | POA: Diagnosis not present

## 2018-01-06 DIAGNOSIS — I10 Essential (primary) hypertension: Secondary | ICD-10-CM | POA: Diagnosis not present

## 2018-01-06 DIAGNOSIS — Z7982 Long term (current) use of aspirin: Secondary | ICD-10-CM | POA: Diagnosis not present

## 2018-01-06 DIAGNOSIS — E119 Type 2 diabetes mellitus without complications: Secondary | ICD-10-CM | POA: Diagnosis not present

## 2018-01-06 DIAGNOSIS — M6281 Muscle weakness (generalized): Secondary | ICD-10-CM | POA: Diagnosis not present

## 2018-01-10 DIAGNOSIS — R2681 Unsteadiness on feet: Secondary | ICD-10-CM | POA: Diagnosis not present

## 2018-01-10 DIAGNOSIS — M6281 Muscle weakness (generalized): Secondary | ICD-10-CM | POA: Diagnosis not present

## 2018-01-10 DIAGNOSIS — Z7982 Long term (current) use of aspirin: Secondary | ICD-10-CM | POA: Diagnosis not present

## 2018-01-10 DIAGNOSIS — E119 Type 2 diabetes mellitus without complications: Secondary | ICD-10-CM | POA: Diagnosis not present

## 2018-01-10 DIAGNOSIS — R42 Dizziness and giddiness: Secondary | ICD-10-CM | POA: Diagnosis not present

## 2018-01-10 DIAGNOSIS — I10 Essential (primary) hypertension: Secondary | ICD-10-CM | POA: Diagnosis not present

## 2018-01-13 DIAGNOSIS — Z7982 Long term (current) use of aspirin: Secondary | ICD-10-CM | POA: Diagnosis not present

## 2018-01-13 DIAGNOSIS — R2681 Unsteadiness on feet: Secondary | ICD-10-CM | POA: Diagnosis not present

## 2018-01-13 DIAGNOSIS — I10 Essential (primary) hypertension: Secondary | ICD-10-CM | POA: Diagnosis not present

## 2018-01-13 DIAGNOSIS — E119 Type 2 diabetes mellitus without complications: Secondary | ICD-10-CM | POA: Diagnosis not present

## 2018-01-13 DIAGNOSIS — R42 Dizziness and giddiness: Secondary | ICD-10-CM | POA: Diagnosis not present

## 2018-01-13 DIAGNOSIS — M6281 Muscle weakness (generalized): Secondary | ICD-10-CM | POA: Diagnosis not present

## 2018-01-17 DIAGNOSIS — E119 Type 2 diabetes mellitus without complications: Secondary | ICD-10-CM | POA: Diagnosis not present

## 2018-01-17 DIAGNOSIS — I10 Essential (primary) hypertension: Secondary | ICD-10-CM | POA: Diagnosis not present

## 2018-01-17 DIAGNOSIS — Z7982 Long term (current) use of aspirin: Secondary | ICD-10-CM | POA: Diagnosis not present

## 2018-01-17 DIAGNOSIS — M6281 Muscle weakness (generalized): Secondary | ICD-10-CM | POA: Diagnosis not present

## 2018-01-17 DIAGNOSIS — R42 Dizziness and giddiness: Secondary | ICD-10-CM | POA: Diagnosis not present

## 2018-01-17 DIAGNOSIS — R2681 Unsteadiness on feet: Secondary | ICD-10-CM | POA: Diagnosis not present

## 2018-01-18 DIAGNOSIS — R42 Dizziness and giddiness: Secondary | ICD-10-CM | POA: Diagnosis not present

## 2018-01-18 DIAGNOSIS — E119 Type 2 diabetes mellitus without complications: Secondary | ICD-10-CM | POA: Diagnosis not present

## 2018-01-18 DIAGNOSIS — Z7982 Long term (current) use of aspirin: Secondary | ICD-10-CM | POA: Diagnosis not present

## 2018-01-18 DIAGNOSIS — M6281 Muscle weakness (generalized): Secondary | ICD-10-CM | POA: Diagnosis not present

## 2018-01-18 DIAGNOSIS — I10 Essential (primary) hypertension: Secondary | ICD-10-CM | POA: Diagnosis not present

## 2018-01-18 DIAGNOSIS — R2681 Unsteadiness on feet: Secondary | ICD-10-CM | POA: Diagnosis not present

## 2018-01-24 ENCOUNTER — Ambulatory Visit
Admission: RE | Admit: 2018-01-24 | Discharge: 2018-01-24 | Disposition: A | Discharge status: Home / Self Care | Payer: Medicare Other | Source: Ambulatory Visit | Attending: Internal Medicine | Admitting: Internal Medicine

## 2018-01-24 DIAGNOSIS — Z1231 Encounter for screening mammogram for malignant neoplasm of breast: Secondary | ICD-10-CM

## 2018-02-07 ENCOUNTER — Ambulatory Visit
Admission: RE | Admit: 2018-02-07 | Discharge: 2018-02-07 | Disposition: A | Discharge status: Home / Self Care | Payer: Medicare Other | Source: Ambulatory Visit | Attending: Internal Medicine | Admitting: Internal Medicine

## 2018-02-07 DIAGNOSIS — M85851 Other specified disorders of bone density and structure, right thigh: Secondary | ICD-10-CM | POA: Diagnosis not present

## 2018-02-07 DIAGNOSIS — Z780 Asymptomatic menopausal state: Secondary | ICD-10-CM | POA: Diagnosis not present

## 2018-02-07 DIAGNOSIS — E2839 Other primary ovarian failure: Secondary | ICD-10-CM

## 2018-03-13 ENCOUNTER — Telehealth: Payer: Self-pay | Admitting: Cardiology

## 2018-03-13 ENCOUNTER — Ambulatory Visit (INDEPENDENT_AMBULATORY_CARE_PROVIDER_SITE_OTHER): Payer: Medicare Other | Admitting: *Deleted

## 2018-03-13 DIAGNOSIS — I442 Atrioventricular block, complete: Secondary | ICD-10-CM

## 2018-03-13 NOTE — Telephone Encounter (Signed)
Confirmed remote transmission w/ pt daughter.   

## 2018-03-14 NOTE — Progress Notes (Signed)
Remote pacemaker transmission.   

## 2018-03-15 ENCOUNTER — Encounter: Payer: Self-pay | Admitting: Cardiology

## 2018-03-18 LAB — CUP PACEART REMOTE DEVICE CHECK
Battery Remaining Longevity: 17 mo
Battery Voltage: 2.72 V
Brady Statistic AS VP Percent: 90 %
Date Time Interrogation Session: 20190701202328
Implantable Lead Implant Date: 20030311
Implantable Lead Location: 753860
Implantable Lead Model: 4592
Implantable Pulse Generator Implant Date: 20100120
Lead Channel Impedance Value: 633 Ohm
Lead Channel Pacing Threshold Amplitude: 0.375 V
Lead Channel Pacing Threshold Pulse Width: 0.4 ms
Lead Channel Sensing Intrinsic Amplitude: 2.8 mV
Lead Channel Setting Pacing Amplitude: 2.5 V
Lead Channel Setting Pacing Pulse Width: 0.4 ms
Lead Channel Setting Sensing Sensitivity: 2.8 mV
MDC IDC LEAD IMPLANT DT: 20030311
MDC IDC LEAD LOCATION: 753859
MDC IDC MSMT BATTERY IMPEDANCE: 3394 Ohm
MDC IDC MSMT LEADCHNL RA PACING THRESHOLD PULSEWIDTH: 0.4 ms
MDC IDC MSMT LEADCHNL RV IMPEDANCE VALUE: 714 Ohm
MDC IDC MSMT LEADCHNL RV PACING THRESHOLD AMPLITUDE: 0.625 V
MDC IDC SET LEADCHNL RA PACING AMPLITUDE: 2 V
MDC IDC STAT BRADY AP VP PERCENT: 9 %
MDC IDC STAT BRADY AP VS PERCENT: 0 %
MDC IDC STAT BRADY AS VS PERCENT: 0 %

## 2018-03-20 DIAGNOSIS — I739 Peripheral vascular disease, unspecified: Secondary | ICD-10-CM | POA: Diagnosis not present

## 2018-03-20 DIAGNOSIS — L603 Nail dystrophy: Secondary | ICD-10-CM | POA: Diagnosis not present

## 2018-03-20 DIAGNOSIS — E1151 Type 2 diabetes mellitus with diabetic peripheral angiopathy without gangrene: Secondary | ICD-10-CM | POA: Diagnosis not present

## 2018-03-20 DIAGNOSIS — L84 Corns and callosities: Secondary | ICD-10-CM | POA: Diagnosis not present

## 2018-03-28 ENCOUNTER — Other Ambulatory Visit: Payer: Self-pay | Admitting: Internal Medicine

## 2018-03-28 ENCOUNTER — Ambulatory Visit
Admission: RE | Admit: 2018-03-28 | Discharge: 2018-03-28 | Disposition: A | Discharge status: Home / Self Care | Payer: Medicare Other | Source: Ambulatory Visit | Attending: Internal Medicine | Admitting: Internal Medicine

## 2018-03-28 DIAGNOSIS — M25551 Pain in right hip: Secondary | ICD-10-CM | POA: Diagnosis not present

## 2018-03-28 DIAGNOSIS — I10 Essential (primary) hypertension: Secondary | ICD-10-CM | POA: Diagnosis not present

## 2018-03-28 DIAGNOSIS — M545 Low back pain: Secondary | ICD-10-CM | POA: Diagnosis not present

## 2018-03-28 DIAGNOSIS — E1165 Type 2 diabetes mellitus with hyperglycemia: Secondary | ICD-10-CM | POA: Diagnosis not present

## 2018-03-28 DIAGNOSIS — M48061 Spinal stenosis, lumbar region without neurogenic claudication: Secondary | ICD-10-CM | POA: Diagnosis not present

## 2018-03-28 DIAGNOSIS — R1319 Other dysphagia: Secondary | ICD-10-CM | POA: Diagnosis not present

## 2018-03-28 DIAGNOSIS — F320 Major depressive disorder, single episode, mild: Secondary | ICD-10-CM | POA: Diagnosis not present

## 2018-04-03 ENCOUNTER — Other Ambulatory Visit: Payer: Self-pay | Admitting: Internal Medicine

## 2018-04-03 DIAGNOSIS — R1310 Dysphagia, unspecified: Secondary | ICD-10-CM

## 2018-04-03 DIAGNOSIS — R1319 Other dysphagia: Secondary | ICD-10-CM

## 2018-04-11 ENCOUNTER — Ambulatory Visit
Admission: RE | Admit: 2018-04-11 | Discharge: 2018-04-11 | Disposition: A | Discharge status: Home / Self Care | Payer: Medicare Other | Source: Ambulatory Visit | Attending: Internal Medicine | Admitting: Internal Medicine

## 2018-04-11 DIAGNOSIS — R1310 Dysphagia, unspecified: Secondary | ICD-10-CM

## 2018-04-11 DIAGNOSIS — R1319 Other dysphagia: Secondary | ICD-10-CM

## 2018-04-17 ENCOUNTER — Other Ambulatory Visit: Payer: Self-pay | Admitting: Internal Medicine

## 2018-04-17 DIAGNOSIS — M47817 Spondylosis without myelopathy or radiculopathy, lumbosacral region: Secondary | ICD-10-CM | POA: Diagnosis not present

## 2018-04-17 DIAGNOSIS — E0821 Diabetes mellitus due to underlying condition with diabetic nephropathy: Secondary | ICD-10-CM | POA: Diagnosis not present

## 2018-04-17 DIAGNOSIS — E782 Mixed hyperlipidemia: Secondary | ICD-10-CM | POA: Diagnosis not present

## 2018-04-17 DIAGNOSIS — I10 Essential (primary) hypertension: Secondary | ICD-10-CM | POA: Diagnosis not present

## 2018-04-17 DIAGNOSIS — M545 Low back pain: Secondary | ICD-10-CM | POA: Diagnosis not present

## 2018-04-17 DIAGNOSIS — R1310 Dysphagia, unspecified: Secondary | ICD-10-CM

## 2018-04-17 DIAGNOSIS — E1165 Type 2 diabetes mellitus with hyperglycemia: Secondary | ICD-10-CM | POA: Diagnosis not present

## 2018-04-17 DIAGNOSIS — E1142 Type 2 diabetes mellitus with diabetic polyneuropathy: Secondary | ICD-10-CM | POA: Diagnosis not present

## 2018-04-18 ENCOUNTER — Other Ambulatory Visit (HOSPITAL_COMMUNITY): Payer: Self-pay | Admitting: Physical Medicine and Rehabilitation

## 2018-04-18 DIAGNOSIS — M48061 Spinal stenosis, lumbar region without neurogenic claudication: Secondary | ICD-10-CM | POA: Diagnosis not present

## 2018-04-18 DIAGNOSIS — M5416 Radiculopathy, lumbar region: Secondary | ICD-10-CM

## 2018-04-18 DIAGNOSIS — M545 Low back pain: Secondary | ICD-10-CM | POA: Diagnosis not present

## 2018-04-24 ENCOUNTER — Emergency Department (HOSPITAL_COMMUNITY)
Admission: EM | Admit: 2018-04-24 | Discharge: 2018-04-24 | Disposition: A | Discharge status: Home / Self Care | Payer: Medicare Other | Attending: Emergency Medicine | Admitting: Emergency Medicine

## 2018-04-24 ENCOUNTER — Other Ambulatory Visit: Payer: Self-pay

## 2018-04-24 ENCOUNTER — Emergency Department (HOSPITAL_COMMUNITY): Payer: Medicare Other

## 2018-04-24 ENCOUNTER — Encounter (HOSPITAL_COMMUNITY): Payer: Self-pay | Admitting: Radiology

## 2018-04-24 DIAGNOSIS — F17220 Nicotine dependence, chewing tobacco, uncomplicated: Secondary | ICD-10-CM | POA: Insufficient documentation

## 2018-04-24 DIAGNOSIS — Z96653 Presence of artificial knee joint, bilateral: Secondary | ICD-10-CM | POA: Insufficient documentation

## 2018-04-24 DIAGNOSIS — R270 Ataxia, unspecified: Secondary | ICD-10-CM | POA: Diagnosis not present

## 2018-04-24 DIAGNOSIS — Z7984 Long term (current) use of oral hypoglycemic drugs: Secondary | ICD-10-CM | POA: Diagnosis not present

## 2018-04-24 DIAGNOSIS — Z950 Presence of cardiac pacemaker: Secondary | ICD-10-CM | POA: Insufficient documentation

## 2018-04-24 DIAGNOSIS — I10 Essential (primary) hypertension: Secondary | ICD-10-CM | POA: Diagnosis not present

## 2018-04-24 DIAGNOSIS — I6523 Occlusion and stenosis of bilateral carotid arteries: Secondary | ICD-10-CM | POA: Diagnosis not present

## 2018-04-24 DIAGNOSIS — R350 Frequency of micturition: Secondary | ICD-10-CM

## 2018-04-24 DIAGNOSIS — Z7982 Long term (current) use of aspirin: Secondary | ICD-10-CM | POA: Diagnosis not present

## 2018-04-24 DIAGNOSIS — R531 Weakness: Secondary | ICD-10-CM | POA: Insufficient documentation

## 2018-04-24 DIAGNOSIS — R2681 Unsteadiness on feet: Secondary | ICD-10-CM

## 2018-04-24 DIAGNOSIS — E119 Type 2 diabetes mellitus without complications: Secondary | ICD-10-CM | POA: Diagnosis not present

## 2018-04-24 DIAGNOSIS — I6389 Other cerebral infarction: Secondary | ICD-10-CM | POA: Diagnosis not present

## 2018-04-24 DIAGNOSIS — R42 Dizziness and giddiness: Secondary | ICD-10-CM | POA: Diagnosis not present

## 2018-04-24 DIAGNOSIS — Z853 Personal history of malignant neoplasm of breast: Secondary | ICD-10-CM | POA: Insufficient documentation

## 2018-04-24 DIAGNOSIS — R51 Headache: Secondary | ICD-10-CM | POA: Diagnosis not present

## 2018-04-24 DIAGNOSIS — Z79899 Other long term (current) drug therapy: Secondary | ICD-10-CM | POA: Diagnosis not present

## 2018-04-24 LAB — BASIC METABOLIC PANEL
ANION GAP: 14 (ref 5–15)
BUN: 12 mg/dL (ref 8–23)
CO2: 24 mmol/L (ref 22–32)
Calcium: 9.6 mg/dL (ref 8.9–10.3)
Chloride: 99 mmol/L (ref 98–111)
Creatinine, Ser: 0.76 mg/dL (ref 0.44–1.00)
GFR calc non Af Amer: >60 mL/min (ref >60)
GLUCOSE: 152 mg/dL — AB (ref 70–99)
POTASSIUM: 4.3 mmol/L (ref 3.5–5.1)
Sodium: 137 mmol/L (ref 135–145)

## 2018-04-24 LAB — CBC
HEMATOCRIT: 40.2 % (ref 36.0–46.0)
HEMOGLOBIN: 13.2 g/dL (ref 12.0–15.0)
MCH: 28.7 pg (ref 26.0–34.0)
MCHC: 32.8 g/dL (ref 30.0–36.0)
MCV: 87.4 fL (ref 78.0–100.0)
Platelets: 249 10*3/uL (ref 150–400)
RBC: 4.6 MIL/uL (ref 3.87–5.11)
RDW: 12.7 % (ref 11.5–15.5)
WBC: 6.5 10*3/uL (ref 4.0–10.5)

## 2018-04-24 LAB — URINALYSIS, ROUTINE W REFLEX MICROSCOPIC
BILIRUBIN URINE: NEGATIVE
Glucose, UA: NEGATIVE mg/dL
HGB URINE DIPSTICK: NEGATIVE
Ketones, ur: NEGATIVE mg/dL
NITRITE: NEGATIVE
PROTEIN: NEGATIVE mg/dL
SPECIFIC GRAVITY, URINE: 1.004 — AB (ref 1.005–1.030)
pH: 6 (ref 5.0–8.0)

## 2018-04-24 LAB — I-STAT TROPONIN, ED: TROPONIN I, POC: 0.02 ng/mL (ref 0.00–0.08)

## 2018-04-24 MED ORDER — SODIUM CHLORIDE 0.9 % IV BOLUS
1000.0000 mL | Freq: Once | INTRAVENOUS | Status: AC
Start: 2018-04-24 — End: 2018-04-24
  Administered 2018-04-24: 1000 mL via INTRAVENOUS

## 2018-04-24 MED ORDER — IOPAMIDOL (ISOVUE-370) INJECTION 76%
INTRAVENOUS | Status: AC
Start: 2018-04-24 — End: 2018-04-24
  Administered 2018-04-24: 50 mL
  Filled 2018-04-24: qty 50

## 2018-04-24 NOTE — ED Notes (Signed)
Small amount of  Swelling noted at insertion site but good blood return and easy infusion of fluids to gravity without further swelling.
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
## 2018-04-24 NOTE — Discharge Instructions (Addendum)
You were evaluated in the emergency department for dizziness/unsteady gait.  We checked and you had no signs of a urine infection.  You had a CAT scan that showed a stroke that did not appear new.  Neurology felt that you were okay to go home but they want you to follow-up in the clinic.  You should continue your regular medicines and also follow-up with your regular doctor.  Please return if any concerns.

## 2018-04-24 NOTE — ED Notes (Signed)
Pt oob to bedsdie commode with assist for approx 400 cc urine.

## 2018-04-24 NOTE — ED Notes (Signed)
Patient transported to CT 

## 2018-04-24 NOTE — ED Triage Notes (Signed)
Patient c/o dizziness and urinary frequency that began last night.

## 2018-06-12 ENCOUNTER — Telehealth: Payer: Self-pay | Admitting: Cardiology

## 2018-06-12 ENCOUNTER — Ambulatory Visit (INDEPENDENT_AMBULATORY_CARE_PROVIDER_SITE_OTHER): Payer: Medicare Other | Admitting: *Deleted

## 2018-06-12 DIAGNOSIS — I442 Atrioventricular block, complete: Secondary | ICD-10-CM | POA: Diagnosis not present

## 2018-06-12 NOTE — Telephone Encounter (Signed)
LMOVM reminding pt to send remote transmission.   

## 2018-06-13 NOTE — Progress Notes (Signed)
Remote pacemaker transmission.   

## 2018-06-16 LAB — CUP PACEART REMOTE DEVICE CHECK
Battery Impedance: 3627 Ohm
Battery Voltage: 2.71 V
Brady Statistic AP VP Percent: 9 %
Brady Statistic AS VS Percent: 0 %
Date Time Interrogation Session: 20190930193701
Implantable Lead Implant Date: 20030311
Implantable Lead Location: 753859
Implantable Lead Location: 753860
Implantable Lead Model: 4092
Implantable Pulse Generator Implant Date: 20100120
Lead Channel Impedance Value: 708 Ohm
Lead Channel Impedance Value: 786 Ohm
Lead Channel Pacing Threshold Pulse Width: 0.4 ms
Lead Channel Sensing Intrinsic Amplitude: 2.8 mV
Lead Channel Setting Pacing Amplitude: 2 V
Lead Channel Setting Pacing Amplitude: 2.5 V
MDC IDC LEAD IMPLANT DT: 20030311
MDC IDC MSMT BATTERY REMAINING LONGEVITY: 16 mo
MDC IDC MSMT LEADCHNL RA PACING THRESHOLD AMPLITUDE: 0.375 V
MDC IDC MSMT LEADCHNL RV PACING THRESHOLD AMPLITUDE: 0.625 V
MDC IDC MSMT LEADCHNL RV PACING THRESHOLD PULSEWIDTH: 0.4 ms
MDC IDC SET LEADCHNL RV PACING PULSEWIDTH: 0.4 ms
MDC IDC SET LEADCHNL RV SENSING SENSITIVITY: 2.8 mV
MDC IDC STAT BRADY AP VS PERCENT: 0 %
MDC IDC STAT BRADY AS VP PERCENT: 91 %

## 2018-06-22 DIAGNOSIS — E1151 Type 2 diabetes mellitus with diabetic peripheral angiopathy without gangrene: Secondary | ICD-10-CM | POA: Diagnosis not present

## 2018-06-22 DIAGNOSIS — L84 Corns and callosities: Secondary | ICD-10-CM | POA: Diagnosis not present

## 2018-06-22 DIAGNOSIS — L603 Nail dystrophy: Secondary | ICD-10-CM | POA: Diagnosis not present

## 2018-06-22 DIAGNOSIS — I739 Peripheral vascular disease, unspecified: Secondary | ICD-10-CM | POA: Diagnosis not present

## 2018-06-30 ENCOUNTER — Other Ambulatory Visit: Payer: Self-pay | Admitting: Physical Medicine and Rehabilitation

## 2018-06-30 DIAGNOSIS — G8929 Other chronic pain: Secondary | ICD-10-CM

## 2018-06-30 DIAGNOSIS — M545 Low back pain, unspecified: Secondary | ICD-10-CM

## 2018-07-04 ENCOUNTER — Ambulatory Visit
Admission: RE | Admit: 2018-07-04 | Discharge: 2018-07-04 | Disposition: A | Discharge status: Home / Self Care | Payer: Medicare Other | Source: Ambulatory Visit | Attending: Physical Medicine and Rehabilitation | Admitting: Physical Medicine and Rehabilitation

## 2018-07-04 DIAGNOSIS — G8929 Other chronic pain: Secondary | ICD-10-CM

## 2018-07-04 DIAGNOSIS — M545 Low back pain, unspecified: Secondary | ICD-10-CM

## 2018-07-10 DIAGNOSIS — M25552 Pain in left hip: Secondary | ICD-10-CM | POA: Diagnosis not present

## 2018-07-10 DIAGNOSIS — M48061 Spinal stenosis, lumbar region without neurogenic claudication: Secondary | ICD-10-CM | POA: Diagnosis not present

## 2018-07-10 DIAGNOSIS — M545 Low back pain: Secondary | ICD-10-CM | POA: Diagnosis not present

## 2018-07-10 DIAGNOSIS — M25551 Pain in right hip: Secondary | ICD-10-CM | POA: Diagnosis not present

## 2018-07-18 DIAGNOSIS — R109 Unspecified abdominal pain: Secondary | ICD-10-CM | POA: Diagnosis not present

## 2018-07-18 DIAGNOSIS — M25551 Pain in right hip: Secondary | ICD-10-CM | POA: Diagnosis not present

## 2018-07-18 DIAGNOSIS — R630 Anorexia: Secondary | ICD-10-CM | POA: Diagnosis not present

## 2018-07-18 DIAGNOSIS — I10 Essential (primary) hypertension: Secondary | ICD-10-CM | POA: Diagnosis not present

## 2018-07-18 DIAGNOSIS — Z23 Encounter for immunization: Secondary | ICD-10-CM | POA: Diagnosis not present

## 2018-07-18 DIAGNOSIS — M48061 Spinal stenosis, lumbar region without neurogenic claudication: Secondary | ICD-10-CM | POA: Diagnosis not present

## 2018-07-18 DIAGNOSIS — E1342 Other specified diabetes mellitus with diabetic polyneuropathy: Secondary | ICD-10-CM | POA: Diagnosis not present

## 2018-07-18 DIAGNOSIS — E782 Mixed hyperlipidemia: Secondary | ICD-10-CM | POA: Diagnosis not present

## 2018-07-18 DIAGNOSIS — E1165 Type 2 diabetes mellitus with hyperglycemia: Secondary | ICD-10-CM | POA: Diagnosis not present

## 2018-07-22 DIAGNOSIS — K5730 Diverticulosis of large intestine without perforation or abscess without bleeding: Secondary | ICD-10-CM | POA: Insufficient documentation

## 2018-07-27 DIAGNOSIS — M48061 Spinal stenosis, lumbar region without neurogenic claudication: Secondary | ICD-10-CM | POA: Diagnosis not present

## 2018-07-27 DIAGNOSIS — M545 Low back pain: Secondary | ICD-10-CM | POA: Diagnosis not present

## 2018-08-22 DIAGNOSIS — M48061 Spinal stenosis, lumbar region without neurogenic claudication: Secondary | ICD-10-CM | POA: Diagnosis not present

## 2018-08-22 DIAGNOSIS — M545 Low back pain: Secondary | ICD-10-CM | POA: Diagnosis not present

## 2018-08-22 DIAGNOSIS — M25551 Pain in right hip: Secondary | ICD-10-CM | POA: Diagnosis not present

## 2018-08-22 DIAGNOSIS — M25552 Pain in left hip: Secondary | ICD-10-CM | POA: Diagnosis not present

## 2018-09-01 DIAGNOSIS — M25551 Pain in right hip: Secondary | ICD-10-CM | POA: Diagnosis not present

## 2018-09-01 DIAGNOSIS — M25552 Pain in left hip: Secondary | ICD-10-CM | POA: Diagnosis not present

## 2018-09-11 ENCOUNTER — Other Ambulatory Visit: Payer: Self-pay

## 2018-09-11 ENCOUNTER — Encounter (HOSPITAL_COMMUNITY): Payer: Self-pay

## 2018-09-11 ENCOUNTER — Emergency Department (HOSPITAL_COMMUNITY)
Admission: EM | Admit: 2018-09-11 | Discharge: 2018-09-12 | Disposition: A | Discharge status: Home / Self Care | Payer: Medicare Other | Attending: Emergency Medicine | Admitting: Emergency Medicine

## 2018-09-11 ENCOUNTER — Encounter: Payer: Medicare Other | Admitting: Internal Medicine

## 2018-09-11 ENCOUNTER — Telehealth: Payer: Self-pay

## 2018-09-11 ENCOUNTER — Ambulatory Visit (INDEPENDENT_AMBULATORY_CARE_PROVIDER_SITE_OTHER): Payer: Medicare Other

## 2018-09-11 DIAGNOSIS — Z853 Personal history of malignant neoplasm of breast: Secondary | ICD-10-CM | POA: Diagnosis not present

## 2018-09-11 DIAGNOSIS — R531 Weakness: Secondary | ICD-10-CM

## 2018-09-11 DIAGNOSIS — Z7984 Long term (current) use of oral hypoglycemic drugs: Secondary | ICD-10-CM | POA: Insufficient documentation

## 2018-09-11 DIAGNOSIS — Z950 Presence of cardiac pacemaker: Secondary | ICD-10-CM | POA: Diagnosis not present

## 2018-09-11 DIAGNOSIS — R5383 Other fatigue: Secondary | ICD-10-CM | POA: Diagnosis not present

## 2018-09-11 DIAGNOSIS — E119 Type 2 diabetes mellitus without complications: Secondary | ICD-10-CM | POA: Diagnosis not present

## 2018-09-11 DIAGNOSIS — E785 Hyperlipidemia, unspecified: Secondary | ICD-10-CM | POA: Insufficient documentation

## 2018-09-11 DIAGNOSIS — E860 Dehydration: Secondary | ICD-10-CM | POA: Diagnosis not present

## 2018-09-11 DIAGNOSIS — I442 Atrioventricular block, complete: Secondary | ICD-10-CM | POA: Diagnosis not present

## 2018-09-11 DIAGNOSIS — R000 Tachycardia, unspecified: Secondary | ICD-10-CM

## 2018-09-11 DIAGNOSIS — I10 Essential (primary) hypertension: Secondary | ICD-10-CM | POA: Insufficient documentation

## 2018-09-11 DIAGNOSIS — Z7982 Long term (current) use of aspirin: Secondary | ICD-10-CM | POA: Diagnosis not present

## 2018-09-11 DIAGNOSIS — R42 Dizziness and giddiness: Secondary | ICD-10-CM | POA: Diagnosis not present

## 2018-09-11 DIAGNOSIS — R9431 Abnormal electrocardiogram [ECG] [EKG]: Secondary | ICD-10-CM | POA: Diagnosis not present

## 2018-09-11 DIAGNOSIS — Z79899 Other long term (current) drug therapy: Secondary | ICD-10-CM | POA: Insufficient documentation

## 2018-09-11 LAB — BASIC METABOLIC PANEL
Anion gap: 11 (ref 5–15)
BUN: 21 mg/dL (ref 8–23)
CO2: 24 mmol/L (ref 22–32)
Calcium: 9.9 mg/dL (ref 8.9–10.3)
Chloride: 101 mmol/L (ref 98–111)
Creatinine, Ser: 0.87 mg/dL (ref 0.44–1.00)
GFR calc Af Amer: >60 mL/min (ref >60)
GFR calc non Af Amer: 58 mL/min — ABNORMAL LOW (ref >60)
GLUCOSE: 176 mg/dL — AB (ref 70–99)
Potassium: 4.3 mmol/L (ref 3.5–5.1)
Sodium: 136 mmol/L (ref 135–145)

## 2018-09-11 LAB — CBC
HCT: 43.1 % (ref 36.0–46.0)
Hemoglobin: 13.6 g/dL (ref 12.0–15.0)
MCH: 27.8 pg (ref 26.0–34.0)
MCHC: 31.6 g/dL (ref 30.0–36.0)
MCV: 88 fL (ref 80.0–100.0)
Platelets: 239 10*3/uL (ref 150–400)
RBC: 4.9 MIL/uL (ref 3.87–5.11)
RDW: 12.8 % (ref 11.5–15.5)
WBC: 8.4 10*3/uL (ref 4.0–10.5)
nRBC: 0 % (ref 0.0–0.2)

## 2018-09-11 LAB — I-STAT TROPONIN, ED: Troponin i, poc: 0.1 ng/mL (ref 0.00–0.08)

## 2018-09-11 LAB — TSH: TSH: 0.578 u[IU]/mL (ref 0.350–4.500)

## 2018-09-11 MED ORDER — SODIUM CHLORIDE 0.9 % IV BOLUS
1000.0000 mL | Freq: Once | INTRAVENOUS | Status: AC
  Administered 2018-09-11: 1000 mL via INTRAVENOUS

## 2018-09-11 NOTE — Care Management (Signed)
ED CM received consult concerning HH recommendations. Patient presented to [facility] ED with c/o dizziness and weakness. ED CM met with patient and daughter Gwenlyn Saran- [phone]  [phone]) to discuss the EDP recommendations, patient and daughter who were agreeable. They report patient lives alone, and daughter lives near by and checks on patient, but  she states she is also caring for her husband with cancer.  [facility] services explained, and [facility] which she may benefit from. Patient and daughter are agreeable to the services. Provided a list of the CMS compared quality facilities, and [facility] brochure, [facility] was selected. Referral was sent electronically to [facility] and referral placed with [facility]. Explained that a nurse will contact them by phone 24-48 hours post discharge from the ED, and to expect a call from [facility] Nurse up to 1-3 days post discharge as well, Patient and daughter are agreeable and verbalized understanding teach back done. No further ED CM needs identified.

## 2018-09-11 NOTE — Telephone Encounter (Signed)
Spoke with daughter to remind of missed remote transmission 

## 2018-09-11 NOTE — ED Notes (Signed)
While pt being on the monitor. Pt heart rate showed vfib 5-6 runs, told ER MD no records show on monitor to print ekg for ER doctor.

## 2018-09-11 NOTE — ED Provider Notes (Addendum)
[facility]ERG[facility] DEPARTMENT Provider Note   CSN: [id number] Arrival date & time: 09/11/18  1639     History   Chief Complaint Chief Complaint  Patient presents with  . Dizziness  . Weakness    HPI Maria [name] is a 84 y.o. female.  84 yo F with a chief complaint of fatigue.  Going on for the last month but worsening on the past couple days.  She denies chest pain or shortness of breath.  Has had trouble ambulating with her walker.  She is had to stop and take breaks and feels that she is likely to fall.  She has had chronic low back pain that she does not feel is significantly changed.  Denies lower extremity edema denies cough congestion or fever denies vomiting or diarrhea denies dark stool or blood in her stool.  Denies decreased oral intake.  The history is provided by the patient.  Dizziness  Associated symptoms: weakness   Associated symptoms: no chest pain, no headaches, no nausea, no palpitations, no shortness of breath and no vomiting   Weakness  Primary symptoms include dizziness. Pertinent negatives include no shortness of breath, no chest pain, no vomiting and no headaches.  Illness  This is a new problem. The current episode started 2 days ago. The problem occurs constantly. The problem has not changed since onset.Pertinent negatives include no chest pain, no abdominal pain, no headaches and no shortness of breath. Nothing aggravates the symptoms. Nothing relieves the symptoms. She has tried nothing for the symptoms. The treatment provided no relief.    Past Medical History:  Diagnosis Date  . Anxiety   . Arthritis   . Cancer ([place])    breast 2015  . Cardiac pacemaker in situ   . Chest pain   . Diabetes mellitus without complication ([place])   . DM type 2 (diabetes mellitus, type 2) ([place])   . Dyslipidemia   . Dyspnea    with exertion  . Family history of adverse reaction to anesthesia    daughter slow to wake up  . Gallstones   .  Headache   . Hypertension   . Macular degeneration   . Pulmonary hypertension (HCC)    PA peak pressure 81 mmHg by 09/2016 echo  . Type II or unspecified type diabetes mellitus without mention of complication, not stated as uncontrolled   . Wears partial dentures     Patient Active Problem List   Diagnosis Date Noted  . Acute acalculous cholecystitis 10/07/2016  . Elevated troponin 10/07/2016  . Lactic acidosis 10/07/2016  . Sinus tachycardia 10/07/2016  . Dyslipidemia 06/05/2013  . Cardiac pacemaker 05/30/2013  . DM type 2 (diabetes mellitus, type 2) ([place]) 05/21/2013  . Chest pain 05/20/2013  . HTN (hypertension) 05/20/2013    Past Surgical History:  Procedure Laterality Date  . BREAST EXCISIONAL BIOPSY Right 2010  . CATARACT EXTRACTION    . CHOLECYSTECTOMY N/A 01/18/2017   Procedure: LAPAROSCOPIC CHOLECYSTECTOMY;  Surgeon: Ralene Ok, MD;  Location: [facility];  Service: General;  Laterality: N/A;  . IR GENERIC HISTORICAL  10/08/2016   IR PERC CHOLECYSTOSTOMY 10/08/2016 Greggory Keen, MD [facility]  . IR RADIOLOGIST EVAL & MGMT  11/16/2016  . KNEE SURGERY Bilateral   . MULTIPLE TOOTH EXTRACTIONS    . PACEMAKER INSERTION     [employer], [facility] 2003  . PACEMAKER INSERTION    . TOTAL KNEE ARTHROPLASTY Bilateral      OB History  Gravida  0   Para  0   Term  0   Preterm  0   AB  0   Living        SAB  0   TAB  0   Ectopic  0   Multiple      Live Births               Home Medications    Prior to Admission medications   Medication Sig Start Date End Date Taking? Authorizing Provider  acetaminophen (TYLENOL) 500 MG tablet Take 1,000 mg by mouth every 6 (six) hours as needed for mild pain.     [provider]  aspirin EC 81 MG tablet Take 81 mg by mouth daily.    [provider]  Blood Glucose Monitoring Suppl (FREESTYLE LITE) DEVI  08/28/14   [provider]  FREESTYLE LITE test strip  08/28/14   [provider]  Lancets (FREESTYLE) lancets  08/28/14   [provider]  losartan-hydrochlorothiazide (HYZAAR) 50-12.5 MG per tablet Take 1 tablet by mouth daily. 09/11/14   [provider]  Magnesium Oxide 400 (240 Mg) MG TABS Take 400 mg by mouth daily. 02/14/18   [provider]  metFORMIN (GLUCOPHAGE) 1000 MG tablet Take 1 tablet by mouth 2 (two) times daily.  09/09/14   [provider]  Multiple Vitamin (MULTIVITAMIN WITH MINERALS) TABS tablet Take 1 tablet by mouth daily.    [provider]  Multiple Vitamins-Minerals (PRESERVISION AREDS 2 PO) Take 1 tablet by mouth daily.    [provider]  naproxen sodium (ANAPROX) 220 MG tablet Take 220 mg by mouth daily.    [provider]  Polyvinyl Alcohol-Povidone (REFRESH OP) Place 1 drop into both eyes daily.     [provider]  potassium chloride SA (K-DUR,KLOR-CON) 20 MEQ tablet Take 20 mEq by mouth daily.    [provider]  valACYclovir (VALTREX) 1000 MG tablet Take 2,000 mg by mouth every 12 (twelve) hours as needed (outbreaks).  10/15/16   [provider]    Family History Family History  Problem Relation Age of Onset  . Colon cancer Neg Hx     Social History Social History   Tobacco Use  . Smoking status: Never Smoker  . Smokeless tobacco: Current User    Types: Snuff  Substance Use Topics  . Alcohol use: No    Alcohol/week: 1.0 standard drinks    Types: 1 Cans of beer per week  . Drug use: No     Allergies   No known allergies   Review of Systems Review of Systems  Constitutional: Negative for chills and fever.  HENT: Negative for congestion and rhinorrhea.   Eyes: Negative for redness and visual disturbance.  Respiratory: Negative for shortness of breath and wheezing.   Cardiovascular: Negative for chest pain and palpitations.  Gastrointestinal: Negative for abdominal pain, nausea and vomiting.  Genitourinary: Negative for  dysuria and urgency.  Musculoskeletal: Negative for arthralgias and myalgias.  Skin: Negative for pallor and wound.  Neurological: Positive for dizziness and weakness. Negative for headaches.     Physical Exam Updated Vital Signs BP (!) 142/75   Pulse 65   Temp 98.4 F (36.9 C) (Oral)   Resp 20   SpO2 100%   Physical Exam Vitals signs and nursing note reviewed.  Constitutional:      General: She is not in acute distress.    Appearance: She is well-developed. She  is not diaphoretic.  HENT:     Head: Normocephalic and atraumatic.  Eyes:     Pupils: Pupils are equal, round, and reactive to light.  Neck:     Musculoskeletal: Normal range of motion and neck supple.  Cardiovascular:     Rate and Rhythm: Normal rate and regular rhythm.     Heart sounds: No murmur. No friction rub. No gallop.   Pulmonary:     Effort: Pulmonary effort is normal.     Breath sounds: No wheezing or rales.  Abdominal:     General: There is no distension.     Palpations: Abdomen is soft.     Tenderness: There is no abdominal tenderness.  Musculoskeletal:        General: No tenderness.  Skin:    General: Skin is warm and dry.  Neurological:     Mental Status: She is alert and oriented to person, place, and time.     GCS: GCS eye subscore is 4. GCS verbal subscore is 5. GCS motor subscore is 6.     Cranial Nerves: Cranial nerves are intact.     Motor: Motor function is intact.     Coordination: Coordination is intact.     Gait: Gait is intact.     Comments: Rapid short step gait  Psychiatric:        Behavior: Behavior normal.      ED Treatments / Results  Labs (all labs ordered are listed, but only abnormal results are displayed) Labs Reviewed  BASIC METABOLIC PANEL - Abnormal; Notable for the following components:      Result Value   Glucose, Bld 176 (*)    GFR calc non Af Amer 58 (*)    All other components within normal limits  I-STAT TROPONIN, ED - Abnormal; Notable for the  following components:   Troponin i, poc 0.10 (*)    All other components within normal limits  CBC  TSH  URINALYSIS, ROUTINE W REFLEX MICROSCOPIC  CBG MONITORING, ED    EKG EKG Interpretation  Date/Time:  Monday September 11 2018 17:43:22 EST Ventricular Rate:  68 PR Interval:  140 QRS Duration: 146 QT Interval:  472 QTC Calculation: 501 R Axis:   -86 Text Interpretation:  Atrial-sensed ventricular-paced rhythm Abnormal ECG No significant change since last tracing Confirmed by Deno Etienne [phone]) on 09/11/2018 9:17:49 PM   Radiology No results found.  Procedures Procedures (including critical care time)  Medications Ordered in ED Medications  sodium chloride 0.9 % bolus 1,000 mL (1,000 mLs Intravenous New Bag/Given 09/11/18 2211)     Initial Impression / Assessment and Plan / ED Course  I have reviewed the triage vital signs and the nursing notes.  Pertinent labs & imaging results that were available during my care of the patient were reviewed by me and considered in my medical decision making (see chart for details).     84 yo F with a chief complaint of generalized fatigue going on for the past month but worsening over the past couple days.  Hemoglobin is unremarkable patient's renal function is at baseline.  No concerning electrolyte abnormality.  Screening troponin was positive at 0.1.  EKG without concerning finding.  Interrogated pacemaker with no events in the past couple months.  The patient is feeling better after some IV fluids.  I discussed the case with the cardiologist on-call, Dr. [place] Lions, at this point without any objective chest pain or shortness of breath felt that this could be admitted  to the hospitalist for further evaluation.  I discussed with the hospitalist who recommended laboratory troponin in case the initial troponin was elevated and lab error.  Pending.  Signed out to Dr. Leonides Schanz.  Please see her note for further details care in the  ED.  CRITICAL CARE Performed by: Cecilio Asper   Total critical care time: 35 minutes  Critical care time was exclusive of separately billable procedures and treating other patients.  Critical care was necessary to treat or prevent imminent or life-threatening deterioration.  Critical care was time spent personally by me on the following activities: development of treatment plan with patient and/or surrogate as well as nursing, discussions with consultants, evaluation of patient's response to treatment, examination of patient, obtaining history from patient or surrogate, ordering and performing treatments and interventions, ordering and review of laboratory studies, ordering and review of radiographic studies, pulse oximetry and re-evaluation of patient's condition.    Final Clinical Impressions(s) / ED Diagnoses   Final diagnoses:  Dehydration  Fatigue, unspecified type    ED Discharge Orders    None       Deno Etienne, DO 09/11/18 [place], [place], DO 09/12/18 0012

## 2018-09-11 NOTE — ED Triage Notes (Signed)
Pt here for weakness and dizziness.  Weakness for a month and dizziness for the last week.  VAN neg.  A&Ox4.  Dizziness now resolved.

## 2018-09-12 ENCOUNTER — Other Ambulatory Visit: Payer: Self-pay | Admitting: *Deleted

## 2018-09-12 ENCOUNTER — Telehealth: Payer: Self-pay | Admitting: *Deleted

## 2018-09-12 DIAGNOSIS — I10 Essential (primary) hypertension: Secondary | ICD-10-CM | POA: Diagnosis not present

## 2018-09-12 DIAGNOSIS — M1990 Unspecified osteoarthritis, unspecified site: Secondary | ICD-10-CM | POA: Diagnosis not present

## 2018-09-12 DIAGNOSIS — H3530 Unspecified macular degeneration: Secondary | ICD-10-CM | POA: Diagnosis not present

## 2018-09-12 DIAGNOSIS — Z853 Personal history of malignant neoplasm of breast: Secondary | ICD-10-CM | POA: Diagnosis not present

## 2018-09-12 DIAGNOSIS — R5383 Other fatigue: Secondary | ICD-10-CM | POA: Diagnosis not present

## 2018-09-12 DIAGNOSIS — Z7982 Long term (current) use of aspirin: Secondary | ICD-10-CM | POA: Diagnosis not present

## 2018-09-12 DIAGNOSIS — Z7984 Long term (current) use of oral hypoglycemic drugs: Secondary | ICD-10-CM | POA: Diagnosis not present

## 2018-09-12 DIAGNOSIS — I272 Pulmonary hypertension, unspecified: Secondary | ICD-10-CM | POA: Diagnosis not present

## 2018-09-12 DIAGNOSIS — F419 Anxiety disorder, unspecified: Secondary | ICD-10-CM | POA: Diagnosis not present

## 2018-09-12 DIAGNOSIS — E119 Type 2 diabetes mellitus without complications: Secondary | ICD-10-CM | POA: Diagnosis not present

## 2018-09-12 DIAGNOSIS — Z950 Presence of cardiac pacemaker: Secondary | ICD-10-CM | POA: Diagnosis not present

## 2018-09-12 DIAGNOSIS — E785 Hyperlipidemia, unspecified: Secondary | ICD-10-CM | POA: Diagnosis not present

## 2018-09-12 LAB — CUP PACEART REMOTE DEVICE CHECK
Battery Impedance: 4175 Ohm
Battery Remaining Longevity: 13 mo
Battery Voltage: 2.7 V
Brady Statistic AP VP Percent: 8 %
Brady Statistic AP VS Percent: 0 %
Date Time Interrogation Session: 20191231024708
Implantable Lead Implant Date: 20030311
Implantable Lead Implant Date: 20030311
Implantable Lead Location: 753859
Implantable Lead Location: 753860
Implantable Lead Model: 4092
Implantable Lead Model: 4592
Implantable Pulse Generator Implant Date: 20100120
Lead Channel Impedance Value: 721 Ohm
Lead Channel Impedance Value: 750 Ohm
Lead Channel Pacing Threshold Amplitude: 0.625 V
Lead Channel Pacing Threshold Pulse Width: 0.4 ms
Lead Channel Setting Pacing Amplitude: 2 V
Lead Channel Setting Pacing Amplitude: 2.5 V
Lead Channel Setting Pacing Pulse Width: 0.4 ms
Lead Channel Setting Sensing Sensitivity: 2.8 mV
MDC IDC MSMT LEADCHNL RA PACING THRESHOLD AMPLITUDE: 0.375 V
MDC IDC MSMT LEADCHNL RA PACING THRESHOLD PULSEWIDTH: 0.4 ms
MDC IDC STAT BRADY AS VP PERCENT: 92 %
MDC IDC STAT BRADY AS VS PERCENT: 0 %

## 2018-09-12 LAB — URINALYSIS, ROUTINE W REFLEX MICROSCOPIC
Bilirubin Urine: NEGATIVE
Glucose, UA: NEGATIVE mg/dL
Hgb urine dipstick: NEGATIVE
Ketones, ur: NEGATIVE mg/dL
Nitrite: NEGATIVE
Protein, ur: NEGATIVE mg/dL
Specific Gravity, Urine: 1.013 (ref 1.005–1.030)
pH: 5 (ref 5.0–8.0)

## 2018-09-12 LAB — TROPONIN I: Troponin I: <0.03 ng/mL (ref <0.03)

## 2018-09-12 NOTE — ED Notes (Signed)
PT states understanding of care given, follow up care. PT ambulated from ED to car with a steady gait.  

## 2018-09-12 NOTE — Discharge Instructions (Addendum)
Your labs, urine were normal today.  Please follow-up with your primary care physician for further evaluation of your worsening fatigue and weakness.

## 2018-09-12 NOTE — Telephone Encounter (Signed)
[place] Quezada J. Clydene Laming, RN, BSN, [place] [phone] [facility] liaison called to decline pt.  EDCM spoke with Pt chose [facility] to render services. Glyn Ade, RN of [facility] notified. Patient made aware that [facility] will be in contact in 24-48 hours.  No DME needs identified at this time.

## 2018-09-12 NOTE — ED Provider Notes (Signed)
12:00 AM  Assumed care from Dr. Tyrone Nine.  Patient is a 84 year old female who presents to the emergency department with generalized weakness, fatigue over the past month.  Patient's labs have been unremarkable other than an elevated i-STAT troponin.  Dr. Hal Hope with hospitalist service consulted who recommended obtaining a lab troponin.  Urinalysis also pending.  1:40 AM  Pt's lab troponin is negative.  I-STAT likely just hemolyzed.  She is not having any chest pain or shortness of breath.  Her urine shows large leukocytes but only 0-5 white blood cells, rare bacteria, no nitrites or red blood cells.  I do not think this is infection.  No ketones noted in her urine.  She did receive IV fluids here.  I feel she is safe to be discharged home with her family with close follow-up with their primary care doctor.  Family and patient are comfortable with this plan.   At this time, I do not feel there is any life-threatening condition present. I have reviewed and discussed all results (EKG, imaging, lab, urine as appropriate) and exam findings with patient/family. I have reviewed nursing notes and appropriate previous records.  I feel the patient is safe to be discharged home without further emergent workup and can continue workup as an outpatient as needed. Discussed usual and customary return precautions. Patient/family verbalize understanding and are comfortable with this plan.  Outpatient follow-up has been provided as needed. All questions have been answered.    Ward, Delice Bison, DO 09/12/18 [phone]

## 2018-09-12 NOTE — Patient Outreach (Signed)
[facility]) Care Management  09/12/2018  Maria [name] 02-Nov-1923 [id number]  Post ED visit Referral received 12/31  RN spoke with both the pt and permission to speak with her daughter Maria [name] [facility]). Pt states she is doing well today. States she will have [facility] visit today for nursing needs. Pt also pending services for PT/OT and aide services. RN inquired on any specific needs or issues that she may needs services with as noted on her problem list. Pt states she takes all her prescribed medications arranged by her daughter Maria [name]. States her BP is good and she is unable to take her CBG on some days due to holding the needle. Reports her last read was 206 on the 12/24. RN stress the importance of reducing this number to under 150 for a more stable CBG reading. Pt is aware and tries to maintain a healthy diet at 84 y.o. No problems reported with her HF due to the pacemaker with no swelling or related issues. Pt states she will have [facility] involved with a scheduled visit today between 5-6 pm.  RN strongly encouraged pt to discuss her CBG issues and readings to the visit nurse today (verbalized an understanding). RN also inquired on any needed resources as daughter verified pt has mobile meals deliveries and supplies transportation for pt to all her medical appointments. Pt also indicated she will follow up with her primary care provider next week.   RN further offered to assist with case management issues via home visit or telephonically however no needs at this time to address with [facility] involvement. Therefore RN offered to mail out information packet along with a calendar to utilize accordingly to improve pt's monitoring skills and managing her ongoing medical condition (HTN/DM/HF-pacemaker). Based upon the pt and daughter's decline for [facility] services both were very appreciative for the education and discussion today. Case will be closed and provider will be notified as  RN informed the caregiver today. Caregiver aware to reach out to this case manager if needed in the future for any concerns or inquires. Case will be closed.  Patient was recently discharged from hospital and all medications have been reviewed.  Raina Mina, RN Care Management Coordinator [place] Office [phone]

## 2018-09-12 NOTE — Progress Notes (Signed)
Remote pacemaker transmission.   

## 2018-09-14 DIAGNOSIS — E785 Hyperlipidemia, unspecified: Secondary | ICD-10-CM | POA: Diagnosis not present

## 2018-09-14 DIAGNOSIS — I10 Essential (primary) hypertension: Secondary | ICD-10-CM | POA: Diagnosis not present

## 2018-09-14 DIAGNOSIS — E119 Type 2 diabetes mellitus without complications: Secondary | ICD-10-CM | POA: Diagnosis not present

## 2018-09-14 DIAGNOSIS — M1990 Unspecified osteoarthritis, unspecified site: Secondary | ICD-10-CM | POA: Diagnosis not present

## 2018-09-14 DIAGNOSIS — F419 Anxiety disorder, unspecified: Secondary | ICD-10-CM | POA: Diagnosis not present

## 2018-09-14 DIAGNOSIS — I272 Pulmonary hypertension, unspecified: Secondary | ICD-10-CM | POA: Diagnosis not present

## 2018-09-15 DIAGNOSIS — I10 Essential (primary) hypertension: Secondary | ICD-10-CM | POA: Diagnosis not present

## 2018-09-15 DIAGNOSIS — F419 Anxiety disorder, unspecified: Secondary | ICD-10-CM | POA: Diagnosis not present

## 2018-09-15 DIAGNOSIS — M1990 Unspecified osteoarthritis, unspecified site: Secondary | ICD-10-CM | POA: Diagnosis not present

## 2018-09-15 DIAGNOSIS — E785 Hyperlipidemia, unspecified: Secondary | ICD-10-CM | POA: Diagnosis not present

## 2018-09-15 DIAGNOSIS — E119 Type 2 diabetes mellitus without complications: Secondary | ICD-10-CM | POA: Diagnosis not present

## 2018-09-15 DIAGNOSIS — I272 Pulmonary hypertension, unspecified: Secondary | ICD-10-CM | POA: Diagnosis not present

## 2018-09-18 DIAGNOSIS — E119 Type 2 diabetes mellitus without complications: Secondary | ICD-10-CM | POA: Diagnosis not present

## 2018-09-18 DIAGNOSIS — M25551 Pain in right hip: Secondary | ICD-10-CM | POA: Diagnosis not present

## 2018-09-19 DIAGNOSIS — I272 Pulmonary hypertension, unspecified: Secondary | ICD-10-CM | POA: Diagnosis not present

## 2018-09-19 DIAGNOSIS — F419 Anxiety disorder, unspecified: Secondary | ICD-10-CM | POA: Diagnosis not present

## 2018-09-19 DIAGNOSIS — E785 Hyperlipidemia, unspecified: Secondary | ICD-10-CM | POA: Diagnosis not present

## 2018-09-19 DIAGNOSIS — M1990 Unspecified osteoarthritis, unspecified site: Secondary | ICD-10-CM | POA: Diagnosis not present

## 2018-09-19 DIAGNOSIS — I10 Essential (primary) hypertension: Secondary | ICD-10-CM | POA: Diagnosis not present

## 2018-09-19 DIAGNOSIS — E119 Type 2 diabetes mellitus without complications: Secondary | ICD-10-CM | POA: Diagnosis not present

## 2018-09-20 DIAGNOSIS — E119 Type 2 diabetes mellitus without complications: Secondary | ICD-10-CM | POA: Diagnosis not present

## 2018-09-20 DIAGNOSIS — I272 Pulmonary hypertension, unspecified: Secondary | ICD-10-CM | POA: Diagnosis not present

## 2018-09-20 DIAGNOSIS — M1990 Unspecified osteoarthritis, unspecified site: Secondary | ICD-10-CM | POA: Diagnosis not present

## 2018-09-20 DIAGNOSIS — I10 Essential (primary) hypertension: Secondary | ICD-10-CM | POA: Diagnosis not present

## 2018-09-20 DIAGNOSIS — F419 Anxiety disorder, unspecified: Secondary | ICD-10-CM | POA: Diagnosis not present

## 2018-09-20 DIAGNOSIS — E785 Hyperlipidemia, unspecified: Secondary | ICD-10-CM | POA: Diagnosis not present

## 2018-09-21 DIAGNOSIS — I10 Essential (primary) hypertension: Secondary | ICD-10-CM | POA: Diagnosis not present

## 2018-09-21 DIAGNOSIS — E119 Type 2 diabetes mellitus without complications: Secondary | ICD-10-CM | POA: Diagnosis not present

## 2018-09-21 DIAGNOSIS — F419 Anxiety disorder, unspecified: Secondary | ICD-10-CM | POA: Diagnosis not present

## 2018-09-21 DIAGNOSIS — M1990 Unspecified osteoarthritis, unspecified site: Secondary | ICD-10-CM | POA: Diagnosis not present

## 2018-09-21 DIAGNOSIS — I272 Pulmonary hypertension, unspecified: Secondary | ICD-10-CM | POA: Diagnosis not present

## 2018-09-21 DIAGNOSIS — E785 Hyperlipidemia, unspecified: Secondary | ICD-10-CM | POA: Diagnosis not present

## 2018-09-22 DIAGNOSIS — R05 Cough: Secondary | ICD-10-CM | POA: Diagnosis not present

## 2018-09-22 DIAGNOSIS — I10 Essential (primary) hypertension: Secondary | ICD-10-CM | POA: Diagnosis not present

## 2018-09-22 DIAGNOSIS — M48061 Spinal stenosis, lumbar region without neurogenic claudication: Secondary | ICD-10-CM | POA: Diagnosis not present

## 2018-09-22 DIAGNOSIS — E782 Mixed hyperlipidemia: Secondary | ICD-10-CM | POA: Diagnosis not present

## 2018-09-22 DIAGNOSIS — J302 Other seasonal allergic rhinitis: Secondary | ICD-10-CM | POA: Diagnosis not present

## 2018-09-22 DIAGNOSIS — R630 Anorexia: Secondary | ICD-10-CM | POA: Diagnosis not present

## 2018-09-22 DIAGNOSIS — E1165 Type 2 diabetes mellitus with hyperglycemia: Secondary | ICD-10-CM | POA: Diagnosis not present

## 2018-09-22 DIAGNOSIS — M25551 Pain in right hip: Secondary | ICD-10-CM | POA: Diagnosis not present

## 2018-09-25 DIAGNOSIS — E785 Hyperlipidemia, unspecified: Secondary | ICD-10-CM | POA: Diagnosis not present

## 2018-09-25 DIAGNOSIS — E119 Type 2 diabetes mellitus without complications: Secondary | ICD-10-CM | POA: Diagnosis not present

## 2018-09-25 DIAGNOSIS — F419 Anxiety disorder, unspecified: Secondary | ICD-10-CM | POA: Diagnosis not present

## 2018-09-25 DIAGNOSIS — I10 Essential (primary) hypertension: Secondary | ICD-10-CM | POA: Diagnosis not present

## 2018-09-25 DIAGNOSIS — I272 Pulmonary hypertension, unspecified: Secondary | ICD-10-CM | POA: Diagnosis not present

## 2018-09-25 DIAGNOSIS — M1990 Unspecified osteoarthritis, unspecified site: Secondary | ICD-10-CM | POA: Diagnosis not present

## 2018-09-26 DIAGNOSIS — M1990 Unspecified osteoarthritis, unspecified site: Secondary | ICD-10-CM | POA: Diagnosis not present

## 2018-09-26 DIAGNOSIS — I10 Essential (primary) hypertension: Secondary | ICD-10-CM | POA: Diagnosis not present

## 2018-09-26 DIAGNOSIS — F419 Anxiety disorder, unspecified: Secondary | ICD-10-CM | POA: Diagnosis not present

## 2018-09-26 DIAGNOSIS — E119 Type 2 diabetes mellitus without complications: Secondary | ICD-10-CM | POA: Diagnosis not present

## 2018-09-26 DIAGNOSIS — E785 Hyperlipidemia, unspecified: Secondary | ICD-10-CM | POA: Diagnosis not present

## 2018-09-26 DIAGNOSIS — I272 Pulmonary hypertension, unspecified: Secondary | ICD-10-CM | POA: Diagnosis not present

## 2018-09-28 DIAGNOSIS — L603 Nail dystrophy: Secondary | ICD-10-CM | POA: Diagnosis not present

## 2018-09-28 DIAGNOSIS — L84 Corns and callosities: Secondary | ICD-10-CM | POA: Diagnosis not present

## 2018-09-28 DIAGNOSIS — I739 Peripheral vascular disease, unspecified: Secondary | ICD-10-CM | POA: Diagnosis not present

## 2018-09-28 DIAGNOSIS — E1151 Type 2 diabetes mellitus with diabetic peripheral angiopathy without gangrene: Secondary | ICD-10-CM | POA: Diagnosis not present

## 2018-09-29 DIAGNOSIS — F419 Anxiety disorder, unspecified: Secondary | ICD-10-CM | POA: Diagnosis not present

## 2018-09-29 DIAGNOSIS — I10 Essential (primary) hypertension: Secondary | ICD-10-CM | POA: Diagnosis not present

## 2018-09-29 DIAGNOSIS — E785 Hyperlipidemia, unspecified: Secondary | ICD-10-CM | POA: Diagnosis not present

## 2018-09-29 DIAGNOSIS — E119 Type 2 diabetes mellitus without complications: Secondary | ICD-10-CM | POA: Diagnosis not present

## 2018-09-29 DIAGNOSIS — M1990 Unspecified osteoarthritis, unspecified site: Secondary | ICD-10-CM | POA: Diagnosis not present

## 2018-09-29 DIAGNOSIS — I272 Pulmonary hypertension, unspecified: Secondary | ICD-10-CM | POA: Diagnosis not present

## 2018-10-03 DIAGNOSIS — M1990 Unspecified osteoarthritis, unspecified site: Secondary | ICD-10-CM | POA: Diagnosis not present

## 2018-10-03 DIAGNOSIS — I272 Pulmonary hypertension, unspecified: Secondary | ICD-10-CM | POA: Diagnosis not present

## 2018-10-03 DIAGNOSIS — F419 Anxiety disorder, unspecified: Secondary | ICD-10-CM | POA: Diagnosis not present

## 2018-10-03 DIAGNOSIS — E785 Hyperlipidemia, unspecified: Secondary | ICD-10-CM | POA: Diagnosis not present

## 2018-10-03 DIAGNOSIS — E119 Type 2 diabetes mellitus without complications: Secondary | ICD-10-CM | POA: Diagnosis not present

## 2018-10-03 DIAGNOSIS — I10 Essential (primary) hypertension: Secondary | ICD-10-CM | POA: Diagnosis not present

## 2018-10-04 ENCOUNTER — Ambulatory Visit (INDEPENDENT_AMBULATORY_CARE_PROVIDER_SITE_OTHER): Payer: Medicare Other | Admitting: Internal Medicine

## 2018-10-04 ENCOUNTER — Encounter: Payer: Self-pay | Admitting: Internal Medicine

## 2018-10-04 VITALS — BP 130/68 | HR 85 | Ht 63.0 in | Wt 148.2 lb

## 2018-10-04 DIAGNOSIS — I10 Essential (primary) hypertension: Secondary | ICD-10-CM

## 2018-10-04 DIAGNOSIS — I442 Atrioventricular block, complete: Secondary | ICD-10-CM | POA: Diagnosis not present

## 2018-10-04 DIAGNOSIS — Z950 Presence of cardiac pacemaker: Secondary | ICD-10-CM

## 2018-10-04 NOTE — Patient Instructions (Signed)
Medication Instructions:  Your physician recommends that you continue on your current medications as directed. Please refer to the Current Medication list given to you today.  Labwork: None ordered.  Testing/Procedures: None ordered.  Follow-Up: Your physician wants you to follow-up in: one year with Dr. Lovena Le.   You will receive a reminder letter in the mail two months in advance. If you don't receive a letter, please call our office to schedule the follow-up appointment.  Remote monitoring is used to monitor your Pacemaker from home. This monitoring reduces the number of office visits required to check your device to one time per year. It allows [place] to keep an eye on the functioning of your device to ensure it is working properly. You are scheduled for a device check from home on 12/11/2018. You may send your transmission at any time that day. If you have a wireless device, the transmission will be sent automatically. After your physician reviews your transmission, you will receive a postcard with your next transmission date.  Any Other Special Instructions Will Be Listed Below (If Applicable).  If you need a refill on your cardiac medications before your next appointment, please call your pharmacy.

## 2018-10-04 NOTE — Progress Notes (Signed)
HPI Maria [name] returns today for ongoing evaluation and management of her dual-chamber pacemaker. She is a very pleasant 85 year old woman who looks younger than her stated age, with a history of complete heart block status post pacemaker insertion initially in 2003 with a generator change out in 2010. She has had no syncope.She denies chest pain or shortness of breath. She denies dietary indiscretion.  Allergies  Allergen Reactions  . No Known Allergies      Current Outpatient Medications  Medication Sig Dispense Refill  . acetaminophen (TYLENOL) 500 MG tablet Take 1,000 mg by mouth every 6 (six) hours as needed for mild pain.     Marland Kitchen aspirin EC 81 MG tablet Take 81 mg by mouth daily.    . Blood Glucose Monitoring Suppl (FREESTYLE LITE) DEVI   0  . FREESTYLE LITE test strip   4  . Lancets (FREESTYLE) lancets   4  . losartan-hydrochlorothiazide (HYZAAR) 50-12.5 MG per tablet Take 1 tablet by mouth daily.    . Magnesium Oxide 400 (240 Mg) MG TABS Take 400 mg by mouth daily.  1  . metFORMIN (GLUCOPHAGE) 1000 MG tablet Take 1,000 mg by mouth 2 (two) times daily.     . Multiple Vitamin (MULTIVITAMIN WITH MINERALS) TABS tablet Take 1 tablet by mouth daily.    . Multiple Vitamins-Minerals (PRESERVISION AREDS 2 PO) Take 1 tablet by mouth daily.    . potassium chloride SA (K-DUR,KLOR-CON) 20 MEQ tablet Take 20 mEq by mouth daily.     No current facility-administered medications for this visit.      Past Medical History:  Diagnosis Date  . Anxiety   . Arthritis   . Cancer ([place])    breast 2015  . Cardiac pacemaker in situ   . Chest pain   . Diabetes mellitus without complication ([place])   . DM type 2 (diabetes mellitus, type 2) ([place])   . Dyslipidemia   . Dyspnea    with exertion  . Family history of adverse reaction to anesthesia    daughter slow to wake up  . Gallstones   . Headache   . Hypertension   . Macular degeneration   . Pulmonary hypertension (HCC)    PA peak  pressure 81 mmHg by 09/2016 echo  . Type II or unspecified type diabetes mellitus without mention of complication, not stated as uncontrolled   . Wears partial dentures     ROS:   All systems reviewed and negative except as noted in the HPI.   Past Surgical History:  Procedure Laterality Date  . BREAST EXCISIONAL BIOPSY Right 2010  . CATARACT EXTRACTION    . CHOLECYSTECTOMY N/A 01/18/2017   Procedure: LAPAROSCOPIC CHOLECYSTECTOMY;  Surgeon: Ralene Ok, MD;  Location: [facility];  Service: General;  Laterality: N/A;  . IR GENERIC HISTORICAL  10/08/2016   IR PERC CHOLECYSTOSTOMY 10/08/2016 Greggory Keen, MD [facility]INTER[facility]  . IR RADIOLOGIST EVAL & MGMT  11/16/2016  . KNEE SURGERY Bilateral   . MULTIPLE TOOTH EXTRACTIONS    . PACEMAKER INSERTION     [employer], [facility] 2003  . PACEMAKER INSERTION    . TOTAL KNEE ARTHROPLASTY Bilateral      Family History  Problem Relation Age of Onset  . Colon cancer Neg Hx      Social History   Socioeconomic History  . Marital status: Widowed    Spouse name: Not on file  . Number of children: 2  . Years of education: Not  on file  . Highest education level: Not on file  Occupational History  . Not on file  Social Needs  . Financial resource strain: Not on file  . Food insecurity:    Worry: Not on file    Inability: Not on file  . Transportation needs:    Medical: Not on file    Non-medical: Not on file  Tobacco Use  . Smoking status: Never Smoker  . Smokeless tobacco: Current User    Types: Snuff  Substance and Sexual Activity  . Alcohol use: No    Alcohol/week: 1.0 standard drinks    Types: 1 Cans of beer per week  . Drug use: No  . Sexual activity: Not on file  Lifestyle  . Physical activity:    Days per week: Not on file    Minutes per session: Not on file  . Stress: Not on file  Relationships  . Social connections:    Talks on phone: Not on file    Gets together: Not on file    Attends religious service: Not on file     Active member of club or organization: Not on file    Attends meetings of clubs or organizations: Not on file    Relationship status: Not on file  . Intimate partner violence:    Fear of current or ex partner: Not on file    Emotionally abused: Not on file    Physically abused: Not on file    Forced sexual activity: Not on file  Other Topics Concern  . Not on file  Social History Narrative   ** Merged History Encounter **         BP 130/68   Pulse 85   Ht 5\' 3"  (1.6 m)   Wt 148 lb 3.2 oz (67.2 kg)   SpO2 97%   BMI 26.25 kg/m   Physical Exam:  Well appearing elderly woman, looking younger than her stated age, NAD HEENT: Unremarkable Neck:  6 cm JVD, no thyromegally Lymphatics:  No adenopathy Back:  No CVA tenderness Lungs:  Clear with no wheezes HEART:  Regular rate rhythm, no murmurs, no rubs, no clicks Abd:  soft, positive bowel sounds, no organomegally, no rebound, no guarding Ext:  2 plus pulses, no edema, no cyanosis, no clubbing Skin:  No rashes no nodules Neuro:  CN II through XII intact, motor grossly intact   DEVICE  Normal device function.  See PaceArt for details.   Assess/Plan: 1. CHB - she is asymptomatic, s/p PPM. No escape today 2. HTN - her blood pressure is well controlled. No change in her meds. 3. PPM - her [employer] Jude DDD PM is working normally. She has about 13 months of battery longevity.  Maria [name].D.

## 2018-10-05 DIAGNOSIS — F419 Anxiety disorder, unspecified: Secondary | ICD-10-CM | POA: Diagnosis not present

## 2018-10-05 DIAGNOSIS — M1990 Unspecified osteoarthritis, unspecified site: Secondary | ICD-10-CM | POA: Diagnosis not present

## 2018-10-05 DIAGNOSIS — E785 Hyperlipidemia, unspecified: Secondary | ICD-10-CM | POA: Diagnosis not present

## 2018-10-05 DIAGNOSIS — I272 Pulmonary hypertension, unspecified: Secondary | ICD-10-CM | POA: Diagnosis not present

## 2018-10-05 DIAGNOSIS — E119 Type 2 diabetes mellitus without complications: Secondary | ICD-10-CM | POA: Diagnosis not present

## 2018-10-05 DIAGNOSIS — I10 Essential (primary) hypertension: Secondary | ICD-10-CM | POA: Diagnosis not present

## 2018-10-05 LAB — CUP PACEART INCLINIC DEVICE CHECK
Date Time Interrogation Session: 20200123122049
Implantable Lead Implant Date: 20030311
Implantable Lead Implant Date: 20030311
Implantable Lead Location: 753859
Implantable Lead Location: 753860
Implantable Lead Model: 4092
Implantable Lead Model: 4592
Implantable Pulse Generator Implant Date: 20100120

## 2018-10-10 DIAGNOSIS — I10 Essential (primary) hypertension: Secondary | ICD-10-CM | POA: Diagnosis not present

## 2018-10-10 DIAGNOSIS — E119 Type 2 diabetes mellitus without complications: Secondary | ICD-10-CM | POA: Diagnosis not present

## 2018-10-10 DIAGNOSIS — E785 Hyperlipidemia, unspecified: Secondary | ICD-10-CM | POA: Diagnosis not present

## 2018-10-10 DIAGNOSIS — M1990 Unspecified osteoarthritis, unspecified site: Secondary | ICD-10-CM | POA: Diagnosis not present

## 2018-10-10 DIAGNOSIS — F419 Anxiety disorder, unspecified: Secondary | ICD-10-CM | POA: Diagnosis not present

## 2018-10-10 DIAGNOSIS — I272 Pulmonary hypertension, unspecified: Secondary | ICD-10-CM | POA: Diagnosis not present

## 2018-10-17 DIAGNOSIS — Z79899 Other long term (current) drug therapy: Secondary | ICD-10-CM | POA: Diagnosis not present

## 2018-10-17 DIAGNOSIS — I10 Essential (primary) hypertension: Secondary | ICD-10-CM | POA: Diagnosis not present

## 2018-10-17 DIAGNOSIS — K529 Noninfective gastroenteritis and colitis, unspecified: Secondary | ICD-10-CM | POA: Diagnosis not present

## 2018-10-17 DIAGNOSIS — R5383 Other fatigue: Secondary | ICD-10-CM | POA: Diagnosis not present

## 2018-10-17 DIAGNOSIS — E782 Mixed hyperlipidemia: Secondary | ICD-10-CM | POA: Diagnosis not present

## 2018-10-17 DIAGNOSIS — E119 Type 2 diabetes mellitus without complications: Secondary | ICD-10-CM | POA: Diagnosis not present

## 2018-10-25 DIAGNOSIS — E119 Type 2 diabetes mellitus without complications: Secondary | ICD-10-CM | POA: Diagnosis not present

## 2018-10-25 DIAGNOSIS — M1990 Unspecified osteoarthritis, unspecified site: Secondary | ICD-10-CM | POA: Diagnosis not present

## 2018-10-25 DIAGNOSIS — I272 Pulmonary hypertension, unspecified: Secondary | ICD-10-CM | POA: Diagnosis not present

## 2018-10-25 DIAGNOSIS — E785 Hyperlipidemia, unspecified: Secondary | ICD-10-CM | POA: Diagnosis not present

## 2018-10-25 DIAGNOSIS — F419 Anxiety disorder, unspecified: Secondary | ICD-10-CM | POA: Diagnosis not present

## 2018-10-25 DIAGNOSIS — I10 Essential (primary) hypertension: Secondary | ICD-10-CM | POA: Diagnosis not present

## 2018-10-31 DIAGNOSIS — M25552 Pain in left hip: Secondary | ICD-10-CM | POA: Diagnosis not present

## 2018-11-01 DIAGNOSIS — E119 Type 2 diabetes mellitus without complications: Secondary | ICD-10-CM | POA: Diagnosis not present

## 2018-11-01 DIAGNOSIS — M1990 Unspecified osteoarthritis, unspecified site: Secondary | ICD-10-CM | POA: Diagnosis not present

## 2018-11-01 DIAGNOSIS — D649 Anemia, unspecified: Secondary | ICD-10-CM | POA: Insufficient documentation

## 2018-11-01 DIAGNOSIS — F419 Anxiety disorder, unspecified: Secondary | ICD-10-CM | POA: Diagnosis not present

## 2018-11-01 DIAGNOSIS — D6489 Other specified anemias: Secondary | ICD-10-CM | POA: Diagnosis not present

## 2018-11-01 DIAGNOSIS — I10 Essential (primary) hypertension: Secondary | ICD-10-CM | POA: Diagnosis not present

## 2018-11-01 DIAGNOSIS — I272 Pulmonary hypertension, unspecified: Secondary | ICD-10-CM | POA: Diagnosis not present

## 2018-11-01 DIAGNOSIS — E785 Hyperlipidemia, unspecified: Secondary | ICD-10-CM | POA: Diagnosis not present

## 2018-11-08 DIAGNOSIS — E785 Hyperlipidemia, unspecified: Secondary | ICD-10-CM | POA: Diagnosis not present

## 2018-11-08 DIAGNOSIS — E119 Type 2 diabetes mellitus without complications: Secondary | ICD-10-CM | POA: Diagnosis not present

## 2018-11-08 DIAGNOSIS — F419 Anxiety disorder, unspecified: Secondary | ICD-10-CM | POA: Diagnosis not present

## 2018-11-08 DIAGNOSIS — I10 Essential (primary) hypertension: Secondary | ICD-10-CM | POA: Diagnosis not present

## 2018-11-08 DIAGNOSIS — I272 Pulmonary hypertension, unspecified: Secondary | ICD-10-CM | POA: Diagnosis not present

## 2018-11-08 DIAGNOSIS — M1990 Unspecified osteoarthritis, unspecified site: Secondary | ICD-10-CM | POA: Diagnosis not present

## 2018-11-10 DIAGNOSIS — I10 Essential (primary) hypertension: Secondary | ICD-10-CM | POA: Diagnosis not present

## 2018-11-13 DIAGNOSIS — M25552 Pain in left hip: Secondary | ICD-10-CM | POA: Diagnosis not present

## 2018-11-22 DIAGNOSIS — Z9181 History of falling: Secondary | ICD-10-CM | POA: Diagnosis not present

## 2018-12-11 ENCOUNTER — Encounter: Payer: Medicare Other | Admitting: *Deleted

## 2018-12-11 ENCOUNTER — Telehealth: Payer: Self-pay | Admitting: Cardiology

## 2018-12-11 ENCOUNTER — Other Ambulatory Visit: Payer: Self-pay

## 2018-12-11 NOTE — Telephone Encounter (Signed)
Patient called having issues w/ her home monitor. She states that she is not sure how the monitor is hooked up. Instructed pt to call back when a family member can help her trouble shoot the monitor. Pt verbalized understanding and will try to call back tomorrow.

## 2018-12-12 ENCOUNTER — Telehealth: Payer: Self-pay

## 2018-12-12 NOTE — Telephone Encounter (Signed)
New monitor ordered 12/11/2018

## 2018-12-12 NOTE — Telephone Encounter (Signed)
Spoke with patient to remind of missed remote transmission 

## 2018-12-19 ENCOUNTER — Emergency Department (HOSPITAL_COMMUNITY)
Admission: EM | Admit: 2018-12-19 | Discharge: 2018-12-19 | Disposition: A | Discharge status: Home / Self Care | Payer: Medicare Other | Attending: Emergency Medicine | Admitting: Emergency Medicine

## 2018-12-19 ENCOUNTER — Other Ambulatory Visit: Payer: Self-pay

## 2018-12-19 ENCOUNTER — Encounter (HOSPITAL_COMMUNITY): Payer: Self-pay

## 2018-12-19 ENCOUNTER — Emergency Department (HOSPITAL_COMMUNITY): Payer: Medicare Other

## 2018-12-19 ENCOUNTER — Other Ambulatory Visit: Payer: Self-pay | Admitting: Internal Medicine

## 2018-12-19 DIAGNOSIS — Z1231 Encounter for screening mammogram for malignant neoplasm of breast: Secondary | ICD-10-CM

## 2018-12-19 DIAGNOSIS — R1032 Left lower quadrant pain: Secondary | ICD-10-CM | POA: Insufficient documentation

## 2018-12-19 DIAGNOSIS — E119 Type 2 diabetes mellitus without complications: Secondary | ICD-10-CM | POA: Diagnosis not present

## 2018-12-19 DIAGNOSIS — R109 Unspecified abdominal pain: Secondary | ICD-10-CM | POA: Diagnosis not present

## 2018-12-19 DIAGNOSIS — Z79899 Other long term (current) drug therapy: Secondary | ICD-10-CM | POA: Diagnosis not present

## 2018-12-19 DIAGNOSIS — I10 Essential (primary) hypertension: Secondary | ICD-10-CM | POA: Insufficient documentation

## 2018-12-19 DIAGNOSIS — Z950 Presence of cardiac pacemaker: Secondary | ICD-10-CM | POA: Insufficient documentation

## 2018-12-19 DIAGNOSIS — R1319 Other dysphagia: Secondary | ICD-10-CM | POA: Diagnosis not present

## 2018-12-19 DIAGNOSIS — Z7984 Long term (current) use of oral hypoglycemic drugs: Secondary | ICD-10-CM | POA: Insufficient documentation

## 2018-12-19 DIAGNOSIS — Y92009 Unspecified place in unspecified non-institutional (private) residence as the place of occurrence of the external cause: Secondary | ICD-10-CM | POA: Diagnosis not present

## 2018-12-19 DIAGNOSIS — R112 Nausea with vomiting, unspecified: Secondary | ICD-10-CM | POA: Diagnosis not present

## 2018-12-19 DIAGNOSIS — Z853 Personal history of malignant neoplasm of breast: Secondary | ICD-10-CM | POA: Diagnosis not present

## 2018-12-19 DIAGNOSIS — F17220 Nicotine dependence, chewing tobacco, uncomplicated: Secondary | ICD-10-CM | POA: Insufficient documentation

## 2018-12-19 LAB — URINALYSIS, ROUTINE W REFLEX MICROSCOPIC
Bacteria, UA: NONE SEEN
Bilirubin Urine: NEGATIVE
Glucose, UA: NEGATIVE mg/dL
Hgb urine dipstick: NEGATIVE
Ketones, ur: NEGATIVE mg/dL
Nitrite: NEGATIVE
Protein, ur: NEGATIVE mg/dL
Specific Gravity, Urine: 1.009 (ref 1.005–1.030)
pH: 6 (ref 5.0–8.0)

## 2018-12-19 LAB — COMPREHENSIVE METABOLIC PANEL
ALT: 30 U/L (ref 0–44)
AST: 43 U/L — ABNORMAL HIGH (ref 15–41)
Albumin: 4.2 g/dL (ref 3.5–5.0)
Alkaline Phosphatase: 36 U/L — ABNORMAL LOW (ref 38–126)
Anion gap: 10 (ref 5–15)
BUN: 17 mg/dL (ref 8–23)
CO2: 27 mmol/L (ref 22–32)
Calcium: 8.9 mg/dL (ref 8.9–10.3)
Chloride: 102 mmol/L (ref 98–111)
Creatinine, Ser: 0.85 mg/dL (ref 0.44–1.00)
GFR calc Af Amer: >60 mL/min (ref >60)
GFR calc non Af Amer: 59 mL/min — ABNORMAL LOW (ref >60)
Glucose, Bld: 175 mg/dL — ABNORMAL HIGH (ref 70–99)
Potassium: 4 mmol/L (ref 3.5–5.1)
Sodium: 139 mmol/L (ref 135–145)
Total Bilirubin: 0.9 mg/dL (ref 0.3–1.2)
Total Protein: 7.3 g/dL (ref 6.5–8.1)

## 2018-12-19 LAB — CBC
HCT: 38.3 % (ref 36.0–46.0)
Hemoglobin: 12.4 g/dL (ref 12.0–15.0)
MCH: 29.5 pg (ref 26.0–34.0)
MCHC: 32.4 g/dL (ref 30.0–36.0)
MCV: 91 fL (ref 80.0–100.0)
Platelets: 234 10*3/uL (ref 150–400)
RBC: 4.21 MIL/uL (ref 3.87–5.11)
RDW: 14 % (ref 11.5–15.5)
WBC: 7.3 10*3/uL (ref 4.0–10.5)
nRBC: 0 % (ref 0.0–0.2)

## 2018-12-19 LAB — LIPASE, BLOOD: Lipase: 18 U/L (ref 11–51)

## 2018-12-19 LAB — TROPONIN I: Troponin I: <0.03 ng/mL (ref <0.03)

## 2018-12-19 MED ORDER — SODIUM CHLORIDE 0.9% FLUSH: 3.0000 mL | Freq: Once | INTRAVENOUS | Status: DC

## 2018-12-19 MED ORDER — SODIUM CHLORIDE (PF) 0.9 % IJ SOLN
INTRAMUSCULAR | Status: AC
  Filled 2018-12-19: qty 50

## 2018-12-19 MED ORDER — ONDANSETRON 4 MG PO TBDP: 4.0000 mg | ORAL_TABLET | Freq: Three times a day (TID) | ORAL | 0 refills | Status: DC | PRN

## 2018-12-19 MED ORDER — SODIUM CHLORIDE 0.9 % IV BOLUS
500.0000 mL | Freq: Once | INTRAVENOUS | Status: AC
  Administered 2018-12-19: 500 mL via INTRAVENOUS

## 2018-12-19 MED ORDER — ACETAMINOPHEN 325 MG PO TABS
650.0000 mg | ORAL_TABLET | Freq: Once | ORAL | Status: AC
  Administered 2018-12-19: 650 mg via ORAL
  Filled 2018-12-19: qty 2

## 2018-12-19 MED ORDER — IOHEXOL 300 MG/ML  SOLN
100.0000 mL | Freq: Once | INTRAMUSCULAR | Status: AC | PRN
  Administered 2018-12-19: 100 mL via INTRAVENOUS

## 2018-12-19 NOTE — Discharge Instructions (Addendum)
You have been seen today for abdominal pain. Please read and follow all provided instructions.   1. Medications: zofran for nausea, usual home medications 2. Treatment: rest, drink plenty of fluids, over the counter Miralax for constipation 3. Follow Up: Please call and follow up with gastroenterology. Please follow up with your primary doctor in 2 days for discussion of your diagnoses and further evaluation after today's visit; if you do not have a primary care doctor use the resource guide provided to find one; Please return to the ER for any new or worsening symptoms. Please obtain all of your results from medical records or have your doctors office obtain the results - share them with your doctor - you should be seen at your doctors office. Call today to arrange your follow up.   Take medications as prescribed. Please review all of the medicines and only take them if you do not have an allergy to them. Return to the emergency room for worsening condition or new concerning symptoms. Follow up with your regular doctor. If you don't have a regular doctor use one of the numbers below to establish a primary care doctor.  Please be aware that if you are taking birth control pills, taking other prescriptions, ESPECIALLY ANTIBIOTICS may make the birth control ineffective - if this is the case, either do not engage in sexual activity or use alternative methods of birth control such as condoms until you have finished the medicine and your family doctor says it is OK to restart them. If you are on a blood thinner such as COUMADIN, be aware that any other medicine that you take may cause the coumadin to either work too much, or not enough - you should have your coumadin level rechecked in next 7 days if this is the case.  ?  It is also a possibility that you have an allergic reaction to any of the medicines that you have been prescribed - Everybody reacts differently to medications and while MOST people have no  trouble with most medicines, you may have a reaction such as nausea, vomiting, rash, swelling, shortness of breath. If this is the case, please stop taking the medicine immediately and contact your physician.  ?  You should return to the ER if you develop severe or worsening symptoms.   Emergency Department Resource Guide 1) Find a Doctor and Pay Out of Pocket Although you won't have to find out who is covered by your insurance plan, it is a good idea to ask around and get recommendations. You will then need to call the office and see if the doctor you have chosen will accept you as a new patient and what types of options they offer for patients who are self-pay. Some doctors offer discounts or will set up payment plans for their patients who do not have insurance, but you will need to ask so you aren't surprised when you get to your appointment.  2) Contact Your Local Health Department Not all health departments have doctors that can see patients for sick visits, but many do, so it is worth a call to see if yours does. If you don't know where your local health department is, you can check in your phone book. The CDC also has a tool to help you locate your state's health department, and many state websites also have listings of all of their local health departments.  3) Find a [facility] If your illness is not likely to be very severe or  complicated, you may want to try a walk in clinic. These are popping up all over the country in pharmacies, drugstores, and shopping centers. They're usually staffed by nurse practitioners or physician assistants that have been trained to treat common illnesses and complaints. They're usually fairly quick and inexpensive. However, if you have serious medical issues or chronic medical problems, these are probably not your best option.  No Primary Care Doctor: Call Health Connect at  [phone] - they can help you locate a primary care doctor that  accepts your  insurance, provides certain services, etc. Physician Referral Service- [phone]  Chronic Pain Problems: Organization         Address  Phone   Notes  [facility]  [phone] Patients need to be referred by their primary care doctor.   Medication Assistance: Organization         Address  Phone   Notes  [facility] Medication [facility] [address] [place], [place] [address] [phone] --Must be a resident of [facility] -- Must have NO insurance coverage whatsoever (no Medicaid/ Medicare, etc.) -- The pt. MUST have a primary care doctor that directs their care regularly and follows them in the community   [facility]  [phone]   [employer]  [phone]    Agencies that provide inexpensive medical care: Organization         Address  Phone   Notes  [place]  [phone]   [facility]    [phone]   [facility] [place],  [phone]   [place] [address] [place] [phone]   [facility]    [phone]   [facility]    [phone]   [place] and [address], [place] Phone:  [phone], Fax:  [phone] Hours of Operation:  9 am - 6 pm, M-F.  Also accepts Medicaid/Medicare and self-pay.  [facility] for [address], [address], [place] Phone: [phone], Fax: [phone]. Hours of Operation:  8:30 am - 5:30 pm, M-F.  Also accepts Medicaid and self-pay.  [facility] [address], [place] Phone: [phone]   [place], [place], [place] [phone], Ext. [phone] Mondays & Thursdays: 7-9 AM.  First 15 patients are seen on a first come, first serve basis.    [place] Providers:  Organization         Address  Phone    Notes  [facility] [address],  [phone] Also accepts self-pay patients.  [facility] [address], [place]  [phone]   [place], [address], [place] [phone]   [facility] [address], [place] [phone]   Lucianne Lei [address], [place]   [phone] Only accepts [place] [facility] [place] patients after they have their name applied to their card.   Self-Pay (no insurance) in [facility]:  Organization         Address  Phone   Notes  Sickle Cell Patients, [facility] [place] [phone]   [facility] [place] (  [facility] [place]  [place], [address], [place] [phone]   [facility]/Dr. Osei-Bonsu  [address], [place] or [address], [place] [phone] Phone number for both [facility] and [place] locations is the same.  [facility] and [facility] [address], [place] [phone]   [facility] [address] [place] or [address] [phone]   [facility] [address], [place] [phone], phone; [phone], fax Sees patients 1st and 3rd Saturday of every month.  Must not qualify for public or private insurance (i.e. Medicaid, Medicare, [place] [employer] Choice, Veterans' Benefits)  Household income should be no more than 200% of the poverty level The clinic cannot treat you if you are pregnant or think you are pregnant  Sexually transmitted diseases are not treated at the clinic.

## 2018-12-19 NOTE — ED Notes (Signed)
Patient transported to CT 

## 2018-12-19 NOTE — ED Notes (Signed)
Spoke with daughter, [name], she is coming to pick pt up and transport her back home.

## 2018-12-19 NOTE — ED Triage Notes (Signed)
Pt reports abdominal pain and emesis x2 weeks. Pt states that she was just at PCP and was told to come here. Pt reports PCP is supposed to be sending over some paperwork but is unable to say why she was sent here.

## 2018-12-19 NOTE — ED Provider Notes (Addendum)
[facility] DEPT Provider Note   CSN: [id number] Arrival date & time: 12/19/18  1512   History   Chief Complaint Chief Complaint  Patient presents with  . Abdominal Pain  . Emesis    HPI Maria [name] is a 85 y.o. female with a PMH of anxiety, T2DM, Breast Cancer in 2015, Cardiac pacemaker, HLD, and HTN presenting with intermittent non radiating LLQ abdominal pain onset 2 weeks ago. Patient reports pain is worse after eating and nothing makes it better. Patient denies taking any medications for pain. Patient describes pain as an ache. Patient states she has been eating soup and crackers. Patient reports two episodes of non bilious non bloody vomiting a few days ago. Patient reports food is not digested well. Patient denies dysphagia. Patient denies any vomiting in the last 24 hours. Patient denies diarrhea or constipation. Patient reports last BM was yesterday and it was normal. Patient describes stool color as yellow and denies any blood. Patient was evaluated by PCP and advised to come to the ER for further evaluation for possible UTI or diverticulitis. Patient reports generalized weakness and dysuria. Patient denies urinary retention, urinary frequency, or back pain. Patient denies fever, cough, congestion, rhinorrhea, shortness of breath, or chest pain. Patient denies sick exposures or recent travel. Patient reports she has had a cholecystectomy but denies any other abdominal surgeries. Patient denies vaginal bleeding or discharge.      HPI  Past Medical History:  Diagnosis Date  . Anxiety   . Arthritis   . Cancer ([place])    breast 2015  . Cardiac pacemaker in situ   . Chest pain   . Diabetes mellitus without complication ([place])   . DM type 2 (diabetes mellitus, type 2) ([place])   . Dyslipidemia   . Dyspnea    with exertion  . Family history of adverse reaction to anesthesia    daughter slow to wake up  . Gallstones   . Headache   . Hypertension   .  Macular degeneration   . Pulmonary hypertension ([facility])    PA peak pressure 81 mmHg by 09/2016 echo  . Type II or unspecified type diabetes mellitus without mention of complication, not stated as uncontrolled   . Wears partial dentures     Patient Active Problem List   Diagnosis Date Noted  . Acute acalculous cholecystitis 10/07/2016  . Elevated troponin 10/07/2016  . Lactic acidosis 10/07/2016  . Sinus tachycardia 10/07/2016  . Dyslipidemia 06/05/2013  . Cardiac pacemaker 05/30/2013  . DM type 2 (diabetes mellitus, type 2) ([place]) 05/21/2013  . Chest pain 05/20/2013  . HTN (hypertension) 05/20/2013    Past Surgical History:  Procedure Laterality Date  . BREAST EXCISIONAL BIOPSY Right 2010  . CATARACT EXTRACTION    . CHOLECYSTECTOMY N/A 01/18/2017   Procedure: LAPAROSCOPIC CHOLECYSTECTOMY;  Surgeon: Ralene Ok, MD;  Location: [facility];  Service: General;  Laterality: N/A;  . IR GENERIC HISTORICAL  10/08/2016   IR PERC CHOLECYSTOSTOMY 10/08/2016 Greggory Keen, MD [facility]-INTERV RAD  . IR RADIOLOGIST EVAL & MGMT  11/16/2016  . KNEE SURGERY Bilateral   . MULTIPLE TOOTH EXTRACTIONS    . PACEMAKER INSERTION     [employer]  . PACEMAKER INSERTION    . TOTAL KNEE ARTHROPLASTY Bilateral      OB History    Gravida  0   Para  0   Term  0   Preterm  0   AB  0  Living        SAB  0   TAB  0   Ectopic  0   Multiple      Live Births               Home Medications    Prior to Admission medications   Medication Sig Start Date End Date Taking? Authorizing Provider  acetaminophen (TYLENOL) 500 MG tablet Take 1,000 mg by mouth every 6 (six) hours as needed for mild pain.    Yes [provider]  hydrochlorothiazide (HYDRODIURIL) 12.5 MG tablet Take 12.5 mg by mouth daily. 12/04/18  Yes [provider]  losartan (COZAAR) 50 MG tablet Take 50 mg by mouth daily. 12/04/18  Yes [provider]  metFORMIN (GLUCOPHAGE) 1000 MG tablet Take  1,000 mg by mouth 2 (two) times daily.  09/09/14  Yes [provider]  Multiple Vitamins-Minerals (PRESERVISION AREDS 2 PO) Take 1 tablet by mouth daily.   Yes [provider]  Polyvinyl Alcohol-Povidone (REFRESH OP) Apply 1 drop to eye daily as needed (dry eyes).   Yes [provider]  rosuvastatin (CRESTOR) 5 MG tablet Take 5 mg by mouth at bedtime. 11/15/18  Yes [provider]  Trolamine Salicylate (ASPERCREME EX) Apply 1 application topically daily as needed (muscle aches).   Yes [provider]  valACYclovir (VALTREX) 1000 MG tablet Take 1 g by mouth at bedtime. 11/23/18  Yes [provider]  Blood Glucose Monitoring Suppl (FREESTYLE LITE) DEVI  08/28/14   [provider]  FREESTYLE LITE test strip  08/28/14   [provider]  Lancets (FREESTYLE) lancets  08/28/14   [provider]  omeprazole (PRILOSEC) 20 MG capsule Take 20 mg by mouth daily. 10/10/18   [provider]  ondansetron (ZOFRAN ODT) 4 MG disintegrating tablet Take 1 tablet (4 mg total) by mouth every 8 (eight) hours as needed for nausea or vomiting. 12/19/18   Arville Lime, PA-C    Family History Family History  Problem Relation Age of Onset  . Colon cancer Neg Hx     Social History Social History   Tobacco Use  . Smoking status: Never Smoker  . Smokeless tobacco: Current User    Types: Snuff  Substance Use Topics  . Alcohol use: No    Alcohol/week: 1.0 standard drinks    Types: 1 Cans of beer per week  . Drug use: No     Allergies   No known allergies   Review of Systems Review of Systems  Constitutional: Negative for activity change, appetite change, chills, fever and unexpected weight change.  HENT: Negative for congestion, rhinorrhea, sore throat and trouble swallowing.   Eyes: Negative for visual disturbance.  Respiratory: Negative for cough and shortness of breath.   Cardiovascular: Negative for chest pain.   Gastrointestinal: Positive for abdominal pain and vomiting. Negative for constipation, diarrhea and nausea.  Endocrine: Negative for polydipsia, polyphagia and polyuria.  Genitourinary: Positive for dysuria. Negative for difficulty urinating, flank pain, frequency, vaginal bleeding, vaginal discharge and vaginal pain.  Musculoskeletal: Negative for back pain and myalgias.  Skin: Negative for rash and wound.  Neurological: Positive for weakness. Negative for dizziness, syncope, facial asymmetry, light-headedness, numbness and headaches.  Hematological: Negative for adenopathy.  Psychiatric/Behavioral: The patient is not nervous/anxious.     Physical Exam Updated Vital Signs BP (!) 168/86   Pulse 75   Temp 98.1 F (36.7 C) (Oral)   Resp 19   SpO2 98%  Physical Exam Vitals signs and nursing note reviewed.  Constitutional:      General: She is not in acute distress.    Appearance: She is well-developed. She is not diaphoretic.  HENT:     Head: Normocephalic and atraumatic.     Nose: Nose normal. No congestion or rhinorrhea.     Mouth/Throat:     Mouth: Mucous membranes are dry.     Pharynx: Uvula midline. No posterior oropharyngeal erythema.  Neck:     Musculoskeletal: Normal range of motion and neck supple.  Cardiovascular:     Rate and Rhythm: Normal rate and regular rhythm.     Heart sounds: Normal heart sounds. No murmur. No friction rub. No gallop.   Pulmonary:     Effort: Pulmonary effort is normal. No respiratory distress.     Breath sounds: Normal breath sounds. No wheezing or rales.  Abdominal:     General: Bowel sounds are normal. There is no distension.     Palpations: Abdomen is soft. Abdomen is not rigid. There is no mass.     Tenderness: There is abdominal tenderness in the suprapubic area and left lower quadrant. There is no right CVA tenderness, left CVA tenderness, guarding or rebound.     Hernia: No hernia is present.  Musculoskeletal: Normal range of  motion.  Skin:    Findings: No rash.  Neurological:     Mental Status: She is alert and oriented to person, place, and time.    Mental Status:  Alert, oriented, thought content appropriate, able to give a coherent history. Speech fluent without evidence of aphasia. Able to follow 2 step commands without difficulty.  Cranial Nerves:  II:  Peripheral visual fields grossly normal, pupils equal, round, reactive to light III,IV, VI: ptosis not present, extra-ocular motions intact bilaterally  V,VII: smile symmetric, facial light touch sensation equal VIII: hearing grossly normal to voice  IX,X: symmetric elevation of soft palate, uvula elevates symmetrically  XI: bilateral shoulder shrug symmetric and strong XII: midline tongue extension without fassiculations Motor:  Normal tone. 5/5 in upper and lower extremities bilaterally including strong and equal grip strength and dorsiflexion/plantar flexion Sensory: light touch normal in all extremities.  Deep Tendon Reflexes: 2+ and symmetric in the biceps and patella Cerebellar: normal finger-to-nose with bilateral upper extremities Gait: normal gait and balance.  CV: distal pulses palpable throughout    ED Treatments / Results  Labs (all labs ordered are listed, but only abnormal results are displayed) Labs Reviewed  COMPREHENSIVE METABOLIC PANEL - Abnormal; Notable for the following components:      Result Value   Glucose, Bld 175 (*)    AST 43 (*)    Alkaline Phosphatase 36 (*)    GFR calc non Af Amer 59 (*)    All other components within normal limits  URINALYSIS, ROUTINE W REFLEX MICROSCOPIC - Abnormal; Notable for the following components:   Leukocytes,Ua TRACE (*)    All other components within normal limits  URINE CULTURE  LIPASE, BLOOD  CBC  TROPONIN I    EKG EKG Interpretation  Date/Time:  Tuesday December 19 2018 16:45:33 EDT Ventricular Rate:  63 PR Interval:    QRS Duration: 138 QT Interval:  462 QTC Calculation:  473 R Axis:   -78 Text Interpretation:  A-V dual-paced rhythm with some inhibition No further analysis attempted due to paced rhythm Baseline wander in lead(s) II III aVF No significant change since last tracing Confirmed by Deno Etienne [phone]) on 12/19/2018 6:16:12  PM   Radiology Ct Abdomen Pelvis W Contrast  Result Date: 12/19/2018 CLINICAL DATA:  Abdominal pain, suspect diverticulitis. EXAM: CT ABDOMEN AND PELVIS WITH CONTRAST TECHNIQUE: Multidetector CT imaging of the abdomen and pelvis was performed using the standard protocol following bolus administration of intravenous contrast. CONTRAST:  136mL OMNIPAQUE IOHEXOL 300 MG/ML  SOLN COMPARISON:  CT abdomen pelvis 10/05/2017 FINDINGS: Lower chest: Lung bases clear. Pacemaker leads right atrium and right ventricle. Negative for pericardial effusion Hepatobiliary: No focal liver abnormality is seen. Status post cholecystectomy. No biliary dilatation. Pancreas: Negative Spleen: Negative Adrenals/Urinary Tract: Adrenal glands are unremarkable. Kidneys are normal, without renal calculi, focal lesion, or hydronephrosis. Bladder is unremarkable. Stomach/Bowel: Negative for bowel obstruction. Sigmoid diverticulosis without diverticulitis. Normal appendix. No bowel wall edema. Vascular/Lymphatic: Atherosclerotic aorta and iliacs without aneurysm. Negative for lymphadenopathy. Reproductive: Uterus normal in size with small calcified fibroids. No pelvic mass lesion. Other: No free fluid or free air Musculoskeletal: Lumbar spine degenerative change. No acute skeletal abnormality. IMPRESSION: 1. Sigmoid diverticulosis. Negative for diverticulitis. Normal appendix 2. No cause for acute abdominal pain identified Electronically Signed   By: Franchot Gallo M.D.   On: 12/19/2018 18:05    Procedures Procedures (including critical care time)  Medications Ordered in ED Medications  sodium chloride flush (NS) 0.9 % injection 3 mL (has no administration in time range)   sodium chloride (PF) 0.9 % injection (has no administration in time range)  sodium chloride 0.9 % bolus 500 mL (0 mLs Intravenous Stopped 12/19/18 1800)  iohexol (OMNIPAQUE) 300 MG/ML solution 100 mL (100 mLs Intravenous Contrast Given 12/19/18 1712)  acetaminophen (TYLENOL) tablet 650 mg (650 mg Oral Given 12/19/18 1755)     Initial Impression / Assessment and Plan / ED Course  I have reviewed the triage vital signs and the nursing notes.  Pertinent labs & imaging results that were available during my care of the patient were reviewed by me and considered in my medical decision making (see chart for details).  Clinical Course as of Dec 18 1840  Tue Dec 19, 2018  1657 WBCs are within normal limits.  WBC: 7.3 [AH]  1741 Trace leukocytes noted on UA and patient has urinary symptoms. Will order urine culture.  Leukocytes,Ua(!): TRACE [AH]  3149 Ct abdomen reveals sigmoid diverticulosis. Negative for diverticulitis. Normal appendix. No cause for acute abdominal pain identified    CT Abdomen Pelvis W Contrast [AH]  1834 Called and informed patient's daughter per request of patient. Daughter, [name], states she understands and agrees with plan.   [AH]    Clinical Course User Index [AH] Arville Lime, PA-C      Patient is nontoxic, nonseptic appearing, in no apparent distress.  Patient's pain and other symptoms adequately managed in emergency department.  Fluid bolus given.  WBCs are within normal limits. Labs, imaging and vitals reviewed.  Patient does not meet the SIRS or Sepsis criteria.  On repeat exam patient does not have a surgical abdomin and there are no peritoneal signs.  No indication of appendicitis, bowel obstruction, bowel perforation, or cholecystitis. Concern for diverticulitis due to location of pain and history of diverticulosis. CT abdomen reveals sigmoid diverticulosis and normal appendix. No signs of diverticulitis. Patient discharged home with symptomatic treatment and given  strict instructions for follow-up with their primary care physician.  Advised patient to follow up with GI due to poorly digested food and abdominal pain. I have also discussed reasons to return immediately to the ER.  Patient expresses understanding  and agrees with plan. Discussed plan with daughter by phone as well. Daughter states she understands and agrees with plan.  Findings and plan of care discussed with supervising physician Dr. Tyrone Nine who personally evaluated and examined this patient.  Final Clinical Impressions(s) / ED Diagnoses   Final diagnoses:  LLQ abdominal pain    ED Discharge Orders         Ordered    ondansetron (ZOFRAN ODT) 4 MG disintegrating tablet  Every 8 hours PRN     12/19/18 1837           Arville Lime, PA-C 12/19/18 1841    Arville Lime, PA-C 12/19/18 1842    Deno Etienne, DO 12/19/18 1930

## 2018-12-19 NOTE — ED Notes (Addendum)
Reviewed D/C papers with daughter [name], who is the Legal Guardian. She verbalized understanding and has no questions at this time. Daughter will be transporting patient home.

## 2018-12-20 ENCOUNTER — Other Ambulatory Visit: Payer: Self-pay | Admitting: Internal Medicine

## 2018-12-20 LAB — URINE CULTURE: Culture: <10000 — AB

## 2018-12-27 DIAGNOSIS — R1310 Dysphagia, unspecified: Secondary | ICD-10-CM | POA: Diagnosis not present

## 2018-12-27 DIAGNOSIS — E119 Type 2 diabetes mellitus without complications: Secondary | ICD-10-CM | POA: Diagnosis not present

## 2018-12-27 DIAGNOSIS — R634 Abnormal weight loss: Secondary | ICD-10-CM | POA: Diagnosis not present

## 2018-12-28 ENCOUNTER — Telehealth: Payer: Self-pay

## 2018-12-28 NOTE — Telephone Encounter (Signed)
   Primary Cardiologist: Cristopher Peru, MD   Chart reviewed as part of pre-operative protocol coverage. Patient was contacted 12/28/2018 in reference to pre-operative risk assessment for pending surgery as outlined below.  Maria [name] was last seen on 10/04/2018 by Dr. Lovena Le.  Since that day, Maria [name] has done well. She has no hx of CAD but has a permanent pacemaker for complete heart block. She denies any chest pain, shortness of breath or new cardiac complaints.   Therefore, based on ACC/AHA guidelines, the patient would be at acceptable risk for the planned procedure without further cardiovascular testing.   Appropriate precautions should be followed regarding any use of electrocautery in the setting of pacemaker.   I will route this recommendation to the requesting party via Epic fax function and remove from pre-op pool.  Please call with questions.  Daune Perch, NP 12/28/2018, 4:23 PM

## 2018-12-28 NOTE — Telephone Encounter (Signed)
   [facility] Pre-operative Risk Assessment    Request for surgical clearance:  1. What type of surgery is being performed? EGD   2. When is this surgery scheduled? 01/03/19   3. What type of clearance is required (medical clearance vs. Pharmacy clearance to hold med vs. Both)? MEDICAL   4. Are there any medications that need to be held prior to surgery and how long? NOT LISTED  5. Practice name and name of physician performing surgery? [facility]    6. What is your office phone number [phone]    7.   What is your office fax number [phone]  8.   Anesthesia type (None, local, MAC, general) ? PROPOFOL    Jacinta Shoe 12/28/2018, 4:00 PM  _________________________________________________________________   (provider comments below)

## 2018-12-29 ENCOUNTER — Ambulatory Visit (INDEPENDENT_AMBULATORY_CARE_PROVIDER_SITE_OTHER): Payer: Medicare Other | Admitting: *Deleted

## 2018-12-29 DIAGNOSIS — I442 Atrioventricular block, complete: Secondary | ICD-10-CM

## 2018-12-29 DIAGNOSIS — R000 Tachycardia, unspecified: Secondary | ICD-10-CM

## 2018-12-30 LAB — CUP PACEART REMOTE DEVICE CHECK
Battery Impedance: 6244 Ohm
Battery Remaining Longevity: 3 mo
Battery Voltage: 2.65 V
Brady Statistic AP VP Percent: 10 %
Brady Statistic AP VS Percent: 0 %
Brady Statistic AS VP Percent: 90 %
Brady Statistic AS VS Percent: 0 %
Date Time Interrogation Session: 20200417183116
Implantable Lead Implant Date: 20030311
Implantable Lead Implant Date: 20030311
Implantable Lead Location: 753859
Implantable Lead Location: 753860
Implantable Lead Model: 4092
Implantable Lead Model: 4592
Implantable Pulse Generator Implant Date: 20100120
Lead Channel Impedance Value: 719 Ohm
Lead Channel Impedance Value: 745 Ohm
Lead Channel Pacing Threshold Amplitude: 0.375 V
Lead Channel Pacing Threshold Amplitude: 0.625 V
Lead Channel Pacing Threshold Pulse Width: 0.4 ms
Lead Channel Pacing Threshold Pulse Width: 0.4 ms
Lead Channel Setting Pacing Amplitude: 2 V
Lead Channel Setting Pacing Amplitude: 2.5 V
Lead Channel Setting Pacing Pulse Width: 0.4 ms
Lead Channel Setting Sensing Sensitivity: 2.8 mV

## 2019-01-01 ENCOUNTER — Other Ambulatory Visit: Payer: Self-pay

## 2019-01-03 DIAGNOSIS — K222 Esophageal obstruction: Secondary | ICD-10-CM | POA: Diagnosis not present

## 2019-01-03 DIAGNOSIS — R1310 Dysphagia, unspecified: Secondary | ICD-10-CM | POA: Diagnosis not present

## 2019-01-03 NOTE — Progress Notes (Signed)
Remote pacemaker transmission.   

## 2019-01-12 DIAGNOSIS — M792 Neuralgia and neuritis, unspecified: Secondary | ICD-10-CM | POA: Diagnosis not present

## 2019-01-26 DIAGNOSIS — I10 Essential (primary) hypertension: Secondary | ICD-10-CM | POA: Diagnosis not present

## 2019-01-26 DIAGNOSIS — E1165 Type 2 diabetes mellitus with hyperglycemia: Secondary | ICD-10-CM | POA: Diagnosis not present

## 2019-01-26 DIAGNOSIS — R1319 Other dysphagia: Secondary | ICD-10-CM | POA: Diagnosis not present

## 2019-01-26 DIAGNOSIS — E1142 Type 2 diabetes mellitus with diabetic polyneuropathy: Secondary | ICD-10-CM | POA: Diagnosis not present

## 2019-02-01 DIAGNOSIS — H04123 Dry eye syndrome of bilateral lacrimal glands: Secondary | ICD-10-CM | POA: Diagnosis not present

## 2019-02-01 DIAGNOSIS — H40013 Open angle with borderline findings, low risk, bilateral: Secondary | ICD-10-CM | POA: Diagnosis not present

## 2019-02-01 DIAGNOSIS — H353131 Nonexudative age-related macular degeneration, bilateral, early dry stage: Secondary | ICD-10-CM | POA: Diagnosis not present

## 2019-02-01 DIAGNOSIS — H18413 Arcus senilis, bilateral: Secondary | ICD-10-CM | POA: Diagnosis not present

## 2019-02-01 DIAGNOSIS — H11153 Pinguecula, bilateral: Secondary | ICD-10-CM | POA: Diagnosis not present

## 2019-02-01 DIAGNOSIS — H35033 Hypertensive retinopathy, bilateral: Secondary | ICD-10-CM | POA: Diagnosis not present

## 2019-02-01 DIAGNOSIS — H16223 Keratoconjunctivitis sicca, not specified as Sjogren's, bilateral: Secondary | ICD-10-CM | POA: Diagnosis not present

## 2019-02-01 DIAGNOSIS — I10 Essential (primary) hypertension: Secondary | ICD-10-CM | POA: Diagnosis not present

## 2019-02-01 DIAGNOSIS — E1165 Type 2 diabetes mellitus with hyperglycemia: Secondary | ICD-10-CM | POA: Diagnosis not present

## 2019-02-20 ENCOUNTER — Other Ambulatory Visit: Payer: Self-pay

## 2019-02-20 ENCOUNTER — Ambulatory Visit: Payer: Medicare Other

## 2019-02-20 ENCOUNTER — Ambulatory Visit
Admission: RE | Admit: 2019-02-20 | Discharge: 2019-02-20 | Disposition: A | Discharge status: Home / Self Care | Payer: Medicare Other | Source: Ambulatory Visit | Attending: Internal Medicine | Admitting: Internal Medicine

## 2019-02-20 DIAGNOSIS — Z1231 Encounter for screening mammogram for malignant neoplasm of breast: Secondary | ICD-10-CM

## 2019-03-02 ENCOUNTER — Ambulatory Visit (INDEPENDENT_AMBULATORY_CARE_PROVIDER_SITE_OTHER): Payer: Medicare Other | Admitting: *Deleted

## 2019-03-02 ENCOUNTER — Telehealth: Payer: Self-pay | Admitting: *Deleted

## 2019-03-02 DIAGNOSIS — I442 Atrioventricular block, complete: Secondary | ICD-10-CM

## 2019-03-02 LAB — CUP PACEART REMOTE DEVICE CHECK
Battery Impedance: 8228 Ohm
Battery Remaining Longevity: 1 mo — CL
Battery Voltage: 2.6 V
Brady Statistic AP VP Percent: 10 %
Brady Statistic AP VS Percent: 0 %
Brady Statistic AS VP Percent: 89 %
Brady Statistic AS VS Percent: 1 %
Date Time Interrogation Session: 20200619145345
Implantable Lead Implant Date: 20030311
Implantable Lead Implant Date: 20030311
Implantable Lead Location: 753859
Implantable Lead Location: 753860
Implantable Lead Model: 4092
Implantable Lead Model: 4592
Implantable Pulse Generator Implant Date: 20100120
Lead Channel Impedance Value: 674 Ohm
Lead Channel Impedance Value: 739 Ohm
Lead Channel Pacing Threshold Amplitude: 0.375 V
Lead Channel Pacing Threshold Amplitude: 0.625 V
Lead Channel Pacing Threshold Pulse Width: 0.4 ms
Lead Channel Pacing Threshold Pulse Width: 0.4 ms
Lead Channel Setting Pacing Amplitude: 2 V
Lead Channel Setting Pacing Amplitude: 2.5 V
Lead Channel Setting Pacing Pulse Width: 0.4 ms
Lead Channel Setting Sensing Sensitivity: 4 mV

## 2019-03-02 NOTE — Telephone Encounter (Signed)
Spoke with patient's daughter, [name] [facility]) regarding PPM nearing ERI. Per transmission received today, 6/19, pt has an estimated <1 month remaining on battery (range <1-3 months). [name] aware, agreeable to monthly battery checks. Next remote transmission due 03/30/19 (billable). Explained mode will revert to VVI 65 when at [facility], advised to call back sooner if pt starts experiencing any fluid retention symptoms. [name] verbalizes understanding and thanked me for my call.

## 2019-03-07 NOTE — Progress Notes (Signed)
Remote pacemaker transmission.   

## 2019-03-23 ENCOUNTER — Telehealth: Payer: Self-pay | Admitting: Internal Medicine

## 2019-03-23 NOTE — Telephone Encounter (Signed)
Patient called and states she has a new device, and is unsure what to do before her remote transmission next Friday. If someone could call her from the office Monday and help her out she would appreciate it

## 2019-03-23 NOTE — Telephone Encounter (Signed)
Education done on remote transmissions.Informed that transmission automatic if near monitor. Remote monitor not in bedroom where pt sleeps. Monitor will be  moved to table beside bed. Questions answered.

## 2019-03-25 NOTE — Telephone Encounter (Signed)
Transmission received. Pt reached ERI on 03/04/19. Will route to Jenny/Dr Lovena Le for scheduling of gen change.  Chanetta Marshall, NP 03/25/2019 5:48 PM

## 2019-03-30 ENCOUNTER — Telehealth: Payer: Self-pay

## 2019-03-30 ENCOUNTER — Encounter: Payer: Medicare Other | Admitting: *Deleted

## 2019-03-30 NOTE — Telephone Encounter (Signed)
Left message for patient to remind of missed remote transmission.  

## 2019-04-02 DIAGNOSIS — M545 Low back pain: Secondary | ICD-10-CM | POA: Diagnosis not present

## 2019-04-02 DIAGNOSIS — M25552 Pain in left hip: Secondary | ICD-10-CM | POA: Diagnosis not present

## 2019-04-02 DIAGNOSIS — M25551 Pain in right hip: Secondary | ICD-10-CM | POA: Diagnosis not present

## 2019-04-04 ENCOUNTER — Encounter: Payer: Self-pay | Admitting: Cardiology

## 2019-04-11 ENCOUNTER — Telehealth: Payer: Self-pay

## 2019-04-11 ENCOUNTER — Ambulatory Visit (INDEPENDENT_AMBULATORY_CARE_PROVIDER_SITE_OTHER): Payer: Medicare Other | Admitting: *Deleted

## 2019-04-11 DIAGNOSIS — R000 Tachycardia, unspecified: Secondary | ICD-10-CM

## 2019-04-11 DIAGNOSIS — I442 Atrioventricular block, complete: Secondary | ICD-10-CM

## 2019-04-11 LAB — CUP PACEART REMOTE DEVICE CHECK
Battery Impedance: 10726 Ohm
Battery Voltage: 2.57 V
Brady Statistic RV Percent Paced: 98 %
Date Time Interrogation Session: 20200729182322
Implantable Lead Implant Date: 20030311
Implantable Lead Implant Date: 20030311
Implantable Lead Location: 753859
Implantable Lead Location: 753860
Implantable Lead Model: 4092
Implantable Lead Model: 4592
Implantable Pulse Generator Implant Date: 20100120
Lead Channel Impedance Value: 618 Ohm
Lead Channel Impedance Value: 67 Ohm
Lead Channel Setting Pacing Amplitude: 2.5 V
Lead Channel Setting Pacing Pulse Width: 0.4 ms
Lead Channel Setting Sensing Sensitivity: 4 mV

## 2019-04-11 NOTE — Telephone Encounter (Signed)
Left detailed message for Pt and daughter.  Advised appt made with Dr. Lovena Le for 7/31 at 1100 to discuss gen change.  Then could schedule for gen change the following Monday/Tuesday.  Requested call back to confirm.

## 2019-04-11 NOTE — Telephone Encounter (Signed)
Call back received from daughter.  Ok with appt on 7/31.  Also would like to schedule gen change for 8/3  Will complete work up at appt.

## 2019-04-12 ENCOUNTER — Telehealth: Payer: Self-pay

## 2019-04-12 NOTE — Telephone Encounter (Signed)
   Daughter will accompany her. She was not present for me to ask screening questions.   COVID-19 Pre-Screening Questions:  . In the past 7 to 10 days have you had a cough,  shortness of breath, headache, congestion, fever (100 or greater) body aches, chills, sore throat, or sudden loss of taste or sense of smell? NO . Have you been around anyone with known Covid 19. NO . Have you been around anyone who is awaiting Covid 19 test results in the past 7 to 10 days? NO . Have you been around anyone who has been exposed to Covid 19, or has mentioned symptoms of Covid 19 within the past 7 to 10 days? NO  If you have any concerns/questions about symptoms patients report during screening (either on the phone or at threshold). Contact the provider seeing the patient or DOD for further guidance.  If neither are available contact a member of the leadership team.

## 2019-04-13 ENCOUNTER — Other Ambulatory Visit: Payer: Self-pay

## 2019-04-13 ENCOUNTER — Ambulatory Visit (INDEPENDENT_AMBULATORY_CARE_PROVIDER_SITE_OTHER): Payer: Medicare Other | Admitting: Internal Medicine

## 2019-04-13 ENCOUNTER — Encounter: Payer: Self-pay | Admitting: Internal Medicine

## 2019-04-13 ENCOUNTER — Other Ambulatory Visit (HOSPITAL_COMMUNITY)
Admission: RE | Admit: 2019-04-13 | Discharge: 2019-04-13 | Disposition: A | Discharge status: Home / Self Care | Payer: Medicare Other | Source: Ambulatory Visit | Attending: Internal Medicine | Admitting: Internal Medicine

## 2019-04-13 VITALS — BP 110/66 | HR 65 | Ht 63.0 in | Wt 146.0 lb

## 2019-04-13 DIAGNOSIS — I10 Essential (primary) hypertension: Secondary | ICD-10-CM

## 2019-04-13 DIAGNOSIS — Z01812 Encounter for preprocedural laboratory examination: Secondary | ICD-10-CM | POA: Diagnosis not present

## 2019-04-13 DIAGNOSIS — Z950 Presence of cardiac pacemaker: Secondary | ICD-10-CM

## 2019-04-13 DIAGNOSIS — Z20828 Contact with and (suspected) exposure to other viral communicable diseases: Secondary | ICD-10-CM | POA: Insufficient documentation

## 2019-04-13 DIAGNOSIS — I442 Atrioventricular block, complete: Secondary | ICD-10-CM | POA: Diagnosis not present

## 2019-04-13 LAB — CUP PACEART INCLINIC DEVICE CHECK
Battery Impedance: 10828 Ohm
Battery Voltage: 2.57 V
Brady Statistic RV Percent Paced: 98 %
Date Time Interrogation Session: 20200731121348
Implantable Lead Implant Date: 20030311
Implantable Lead Implant Date: 20030311
Implantable Lead Location: 753859
Implantable Lead Location: 753860
Implantable Lead Model: 4092
Implantable Lead Model: 4592
Implantable Pulse Generator Implant Date: 20100120
Lead Channel Impedance Value: 628 Ohm
Lead Channel Impedance Value: 67 Ohm
Lead Channel Pacing Threshold Amplitude: 0.75 V
Lead Channel Pacing Threshold Pulse Width: 0.4 ms
Lead Channel Setting Pacing Amplitude: 2.5 V
Lead Channel Setting Pacing Pulse Width: 0.4 ms
Lead Channel Setting Sensing Sensitivity: 4 mV

## 2019-04-13 LAB — CBC WITH DIFFERENTIAL/PLATELET
Basophils Absolute: 0 10*3/uL (ref 0.0–0.2)
Basos: 0 %
EOS (ABSOLUTE): 0 10*3/uL (ref 0.0–0.4)
Eos: 0 %
Hematocrit: 41.1 % (ref 34.0–46.6)
Hemoglobin: 13.1 g/dL (ref 11.1–15.9)
Immature Grans (Abs): 0 10*3/uL (ref 0.0–0.1)
Immature Granulocytes: 0 %
Lymphocytes Absolute: 2.4 10*3/uL (ref 0.7–3.1)
Lymphs: 40 %
MCH: 28.4 pg (ref 26.6–33.0)
MCHC: 31.9 g/dL (ref 31.5–35.7)
MCV: 89 fL (ref 79–97)
Monocytes Absolute: 0.5 10*3/uL (ref 0.1–0.9)
Monocytes: 8 %
Neutrophils Absolute: 3 10*3/uL (ref 1.4–7.0)
Neutrophils: 52 %
Platelets: 232 10*3/uL (ref 150–450)
RBC: 4.61 x10E6/uL (ref 3.77–5.28)
RDW: 13.1 % (ref 11.7–15.4)
WBC: 5.9 10*3/uL (ref 3.4–10.8)

## 2019-04-13 LAB — BASIC METABOLIC PANEL
BUN/Creatinine Ratio: 25 (ref 12–28)
BUN: 21 mg/dL (ref 10–36)
CO2: 24 mmol/L (ref 20–29)
Calcium: 9.5 mg/dL (ref 8.7–10.3)
Chloride: 98 mmol/L (ref 96–106)
Creatinine, Ser: 0.83 mg/dL (ref 0.57–1.00)
GFR calc Af Amer: 71 mL/min/{1.73_m2} (ref >59)
GFR calc non Af Amer: 61 mL/min/{1.73_m2} (ref >59)
Glucose: 154 mg/dL — ABNORMAL HIGH (ref 65–99)
Potassium: 4.3 mmol/L (ref 3.5–5.2)
Sodium: 141 mmol/L (ref 134–144)

## 2019-04-13 LAB — SARS CORONAVIRUS 2 (TAT 6-24 HRS): SARS Coronavirus 2: NEGATIVE

## 2019-04-13 NOTE — H&P (View-Only) (Signed)
HPI Maria [name] returns today for followup. She is a pleasant 84 yo woman with CHB, s/p PPM insertion. She reached ERI on her ppm and reverted to VVI 65 pacing. She feels short of breath. She has not had syncope. She denies peripheral edema.  No Known Allergies   Current Outpatient Medications  Medication Sig Dispense Refill   acetaminophen (TYLENOL) 500 MG tablet Take 1,000 mg by mouth every 6 (six) hours as needed for mild pain.      Blood Glucose Monitoring Suppl (FREESTYLE LITE) DEVI   0   FREESTYLE LITE test strip   4   glimepiride (AMARYL) 1 MG tablet Take 1 mg by mouth daily.     hydrochlorothiazide (HYDRODIURIL) 12.5 MG tablet Take 12.5 mg by mouth daily.     Lancets (FREESTYLE) lancets   4   losartan (COZAAR) 50 MG tablet Take 50 mg by mouth daily.     meclizine (ANTIVERT) 25 MG tablet Take 25 mg by mouth 3 (three) times daily as needed for dizziness.     Menthol-Methyl Salicylate (SALONPAS PAIN RELIEF PATCH EX) Place 1 patch onto the skin daily as needed (back pain.).     metFORMIN (GLUCOPHAGE) 1000 MG tablet Take 1,000 mg by mouth daily with breakfast.      Multiple Vitamins-Minerals (PRESERVISION AREDS 2 PO) Take 1 tablet by mouth daily.     omeprazole (PRILOSEC) 20 MG capsule Take 20 mg by mouth daily before breakfast.      ondansetron (ZOFRAN ODT) 4 MG disintegrating tablet Take 1 tablet (4 mg total) by mouth every 8 (eight) hours as needed for nausea or vomiting. 20 tablet 0   Polyvinyl Alcohol-Povidone (REFRESH OP) Apply 1 drop to eye daily.      RESTASIS 0.05 % ophthalmic emulsion Place 1 drop into both eyes daily.     rosuvastatin (CRESTOR) 5 MG tablet Take 5 mg by mouth at bedtime.     Trolamine Salicylate (ASPERCREME EX) Apply 1 application topically daily as needed (muscle aches).     valACYclovir (VALTREX) 1000 MG tablet Take 1 g by mouth daily as needed (cold sore/fever blisters).      No current facility-administered medications for this  visit.      Past Medical History:  Diagnosis Date   Anxiety    Arthritis    Cancer [facility])    breast 2015   Cardiac pacemaker in situ    Chest pain    Diabetes mellitus without complication ([place])    DM type 2 (diabetes mellitus, type 2) ([place])    Dyslipidemia    Dyspnea    with exertion   Family history of adverse reaction to anesthesia    daughter slow to wake up   Gallstones    Headache    Hypertension    Macular degeneration    Pulmonary hypertension (HCC)    PA peak pressure 81 mmHg by 09/2016 echo   Type II or unspecified type diabetes mellitus without mention of complication, not stated as uncontrolled    Wears partial dentures     ROS:   All systems reviewed and negative except as noted in the HPI.   Past Surgical History:  Procedure Laterality Date   BREAST EXCISIONAL BIOPSY Right 2010   CATARACT EXTRACTION     CHOLECYSTECTOMY N/A 01/18/2017   Procedure: LAPAROSCOPIC CHOLECYSTECTOMY;  Surgeon: Ralene Ok, MD;  Location: [place];  Service: General;  Laterality: N/A;   IR GENERIC HISTORICAL  10/08/2016  IR PERC CHOLECYSTOSTOMY 10/08/2016 Greggory Keen, MD [facility]-INTERV RAD   IR RADIOLOGIST EVAL & MGMT  11/16/2016   KNEE SURGERY Bilateral    MULTIPLE TOOTH EXTRACTIONS     PACEMAKER INSERTION     [employer]tr[employer], [facility] 2003   PACEMAKER INSERTION     TOTAL KNEE ARTHROPLASTY Bilateral      Family History  Problem Relation Age of Onset   Colon cancer Neg Hx      Social History   Socioeconomic History   Marital status: Widowed    Spouse name: Not on file   Number of children: 2   Years of education: Not on file   Highest education level: Not on file  Occupational History   Not on file  Social Needs   Financial resource strain: Not on file   Food insecurity    Worry: Not on file    Inability: Not on file   Transportation needs    Medical: Not on file    Non-medical: Not on file  Tobacco Use   Smoking status:  Never Smoker   Smokeless tobacco: Current User    Types: Snuff  Substance and Sexual Activity   Alcohol use: No    Alcohol/week: 1.0 standard drinks    Types: 1 Cans of beer per week   Drug use: No   Sexual activity: Not on file  Lifestyle   Physical activity    Days per week: Not on file    Minutes per session: Not on file   Stress: Not on file  Relationships   Social connections    Talks on phone: Not on file    Gets together: Not on file    Attends religious service: Not on file    Active member of club or organization: Not on file    Attends meetings of clubs or organizations: Not on file    Relationship status: Not on file   Intimate partner violence    Fear of current or ex partner: Not on file    Emotionally abused: Not on file    Physically abused: Not on file    Forced sexual activity: Not on file  Other Topics Concern   Not on file  Social History Narrative   ** Merged History Encounter **         BP 110/66    Pulse 65    Ht 5\' 3"  (1.6 m)    Wt 146 lb (66.2 kg)    SpO2 98%    BMI 25.86 kg/m   Physical Exam:  Well appearing elderly woman, NAD HEENT: Unremarkable Neck:  No JVD, no thyromegally Lymphatics:  No adenopathy Back:  No CVA tenderness Lungs:  Clear with no wheezes HEART:  Regular rate rhythm, no murmurs, no rubs, no clicks Abd:  soft, positive bowel sounds, no organomegally, no rebound, no guarding Ext:  2 plus pulses, no edema, no cyanosis, no clubbing Skin:  No rashes no nodules Neuro:  CN II through XII intact, motor grossly intact  EKG - NSR with ventricular pacing, non-synchronous  DEVICE  Normal device function.  See PaceArt for details. ERI.  Assess/Plan: 1. CHB - she is s/p PPM. She is symptomatic due to VVI pacing 2. PPM- her medtronic device is ERI. She will be scheduled for PPM gen change out.  3. HTN - her bp is well controlled today. No change in meds.   Mikle Bosworth.D.

## 2019-04-13 NOTE — Patient Instructions (Signed)
Medication Instructions:  Your physician recommends that you continue on your current medications as directed. Please refer to the Current Medication list given to you today.  Labwork: You will get lab work today:  BMP and CBC  Testing/Procedures: Pacemaker generator replacement.  Follow-Up: See instruction letter.  Follow up s/p gen change.  Any Other Special Instructions Will Be Listed Below (If Applicable).  If you need a refill on your cardiac medications before your next appointment, please call your pharmacy.

## 2019-04-13 NOTE — Progress Notes (Signed)
HPI Mrs. Wagenaar returns today for followup. She is a pleasant 85 yo woman with CHB, s/p PPM insertion. She reached ERI on her ppm and reverted to VVI 65 pacing. She feels short of breath. She has not had syncope. She denies peripheral edema.  No Known Allergies   Current Outpatient Medications  Medication Sig Dispense Refill   acetaminophen (TYLENOL) 500 MG tablet Take 1,000 mg by mouth every 6 (six) hours as needed for mild pain.      Blood Glucose Monitoring Suppl (FREESTYLE LITE) DEVI   0   FREESTYLE LITE test strip   4   glimepiride (AMARYL) 1 MG tablet Take 1 mg by mouth daily.     hydrochlorothiazide (HYDRODIURIL) 12.5 MG tablet Take 12.5 mg by mouth daily.     Lancets (FREESTYLE) lancets   4   losartan (COZAAR) 50 MG tablet Take 50 mg by mouth daily.     meclizine (ANTIVERT) 25 MG tablet Take 25 mg by mouth 3 (three) times daily as needed for dizziness.     Menthol-Methyl Salicylate (SALONPAS PAIN RELIEF PATCH EX) Place 1 patch onto the skin daily as needed (back pain.).     metFORMIN (GLUCOPHAGE) 1000 MG tablet Take 1,000 mg by mouth daily with breakfast.      Multiple Vitamins-Minerals (PRESERVISION AREDS 2 PO) Take 1 tablet by mouth daily.     omeprazole (PRILOSEC) 20 MG capsule Take 20 mg by mouth daily before breakfast.      ondansetron (ZOFRAN ODT) 4 MG disintegrating tablet Take 1 tablet (4 mg total) by mouth every 8 (eight) hours as needed for nausea or vomiting. 20 tablet 0   Polyvinyl Alcohol-Povidone (REFRESH OP) Apply 1 drop to eye daily.      RESTASIS 0.05 % ophthalmic emulsion Place 1 drop into both eyes daily.     rosuvastatin (CRESTOR) 5 MG tablet Take 5 mg by mouth at bedtime.     Trolamine Salicylate (ASPERCREME EX) Apply 1 application topically daily as needed (muscle aches).     valACYclovir (VALTREX) 1000 MG tablet Take 1 g by mouth daily as needed (cold sore/fever blisters).      No current facility-administered medications for this  visit.      Past Medical History:  Diagnosis Date   Anxiety    Arthritis    Cancer [facility])    breast 2015   Cardiac pacemaker in situ    Chest pain    Diabetes mellitus without complication ([place])    DM type 2 (diabetes mellitus, type 2) ([place])    Dyslipidemia    Dyspnea    with exertion   Family history of adverse reaction to anesthesia    daughter slow to wake up   Gallstones    Headache    Hypertension    Macular degeneration    Pulmonary hypertension (HCC)    PA peak pressure 81 mmHg by 09/2016 echo   Type II or unspecified type diabetes mellitus without mention of complication, not stated as uncontrolled    Wears partial dentures     ROS:   All systems reviewed and negative except as noted in the HPI.   Past Surgical History:  Procedure Laterality Date   BREAST EXCISIONAL BIOPSY Right 2010   CATARACT EXTRACTION     CHOLECYSTECTOMY N/A 01/18/2017   Procedure: LAPAROSCOPIC CHOLECYSTECTOMY;  Surgeon: Ralene Ok, MD;  Location: [place] [facility];  Service: General;  Laterality: N/A;   IR GENERIC HISTORICAL  10/08/2016  IR PERC CHOLECYSTOSTOMY 10/08/2016 Greggory Keen, MD [facility]-INTERV RAD   IR RADIOLOGIST EVAL & MGMT  11/16/2016   KNEE SURGERY Bilateral    MULTIPLE TOOTH EXTRACTIONS     PACEMAKER INSERTION     [employer]tr[employer], [facility] 2003   PACEMAKER INSERTION     TOTAL KNEE ARTHROPLASTY Bilateral      Family History  Problem Relation Age of Onset   Colon cancer Neg Hx      Social History   Socioeconomic History   Marital status: Widowed    Spouse name: Not on file   Number of children: 2   Years of education: Not on file   Highest education level: Not on file  Occupational History   Not on file  Social Needs   Financial resource strain: Not on file   Food insecurity    Worry: Not on file    Inability: Not on file   Transportation needs    Medical: Not on file    Non-medical: Not on file  Tobacco Use   Smoking status:  Never Smoker   Smokeless tobacco: Current User    Types: Snuff  Substance and Sexual Activity   Alcohol use: No    Alcohol/week: 1.0 standard drinks    Types: 1 Cans of beer per week   Drug use: No   Sexual activity: Not on file  Lifestyle   Physical activity    Days per week: Not on file    Minutes per session: Not on file   Stress: Not on file  Relationships   Social connections    Talks on phone: Not on file    Gets together: Not on file    Attends religious service: Not on file    Active member of club or organization: Not on file    Attends meetings of clubs or organizations: Not on file    Relationship status: Not on file   Intimate partner violence    Fear of current or ex partner: Not on file    Emotionally abused: Not on file    Physically abused: Not on file    Forced sexual activity: Not on file  Other Topics Concern   Not on file  Social History Narrative   ** Merged History Encounter **         BP 110/66    Pulse 65    Ht 5\' 3"  (1.6 m)    Wt 146 lb (66.2 kg)    SpO2 98%    BMI 25.86 kg/m   Physical Exam:  Well appearing elderly woman, NAD HEENT: Unremarkable Neck:  No JVD, no thyromegally Lymphatics:  No adenopathy Back:  No CVA tenderness Lungs:  Clear with no wheezes HEART:  Regular rate rhythm, no murmurs, no rubs, no clicks Abd:  soft, positive bowel sounds, no organomegally, no rebound, no guarding Ext:  2 plus pulses, no edema, no cyanosis, no clubbing Skin:  No rashes no nodules Neuro:  CN II through XII intact, motor grossly intact  EKG - NSR with ventricular pacing, non-synchronous  DEVICE  Normal device function.  See PaceArt for details. ERI.  Assess/Plan: 1. CHB - she is s/p PPM. She is symptomatic due to VVI pacing 2. PPM- her medtronic device is ERI. She will be scheduled for PPM gen change out.  3. HTN - her bp is well controlled today. No change in meds.   Mikle Bosworth.D.

## 2019-04-16 ENCOUNTER — Ambulatory Visit (HOSPITAL_COMMUNITY)
Admission: RE | Admit: 2019-04-16 | Discharge: 2019-04-16 | Disposition: A | Discharge status: Home / Self Care | Payer: Medicare Other | Attending: Internal Medicine | Admitting: Internal Medicine

## 2019-04-16 ENCOUNTER — Ambulatory Visit (HOSPITAL_COMMUNITY)
Admission: RE | Disposition: A | Discharge status: Home / Self Care | Payer: Medicare Other | Source: Home / Self Care | Attending: Internal Medicine

## 2019-04-16 ENCOUNTER — Other Ambulatory Visit: Payer: Self-pay

## 2019-04-16 DIAGNOSIS — I272 Pulmonary hypertension, unspecified: Secondary | ICD-10-CM | POA: Diagnosis not present

## 2019-04-16 DIAGNOSIS — M1990 Unspecified osteoarthritis, unspecified site: Secondary | ICD-10-CM | POA: Insufficient documentation

## 2019-04-16 DIAGNOSIS — Z7984 Long term (current) use of oral hypoglycemic drugs: Secondary | ICD-10-CM | POA: Insufficient documentation

## 2019-04-16 DIAGNOSIS — Z79899 Other long term (current) drug therapy: Secondary | ICD-10-CM | POA: Insufficient documentation

## 2019-04-16 DIAGNOSIS — E785 Hyperlipidemia, unspecified: Secondary | ICD-10-CM | POA: Diagnosis not present

## 2019-04-16 DIAGNOSIS — I10 Essential (primary) hypertension: Secondary | ICD-10-CM | POA: Insufficient documentation

## 2019-04-16 DIAGNOSIS — I442 Atrioventricular block, complete: Secondary | ICD-10-CM | POA: Diagnosis not present

## 2019-04-16 DIAGNOSIS — E119 Type 2 diabetes mellitus without complications: Secondary | ICD-10-CM | POA: Diagnosis not present

## 2019-04-16 DIAGNOSIS — Z45010 Encounter for checking and testing of cardiac pacemaker pulse generator [battery]: Secondary | ICD-10-CM | POA: Insufficient documentation

## 2019-04-16 HISTORY — PX: PPM GENERATOR CHANGEOUT: EP1233

## 2019-04-16 LAB — SURGICAL PCR SCREEN
MRSA, PCR: NEGATIVE
Staphylococcus aureus: NEGATIVE

## 2019-04-16 LAB — GLUCOSE, CAPILLARY: Glucose-Capillary: 139 mg/dL — ABNORMAL HIGH (ref 70–99)

## 2019-04-16 SURGERY — PPM GENERATOR CHANGEOUT

## 2019-04-16 MED ORDER — LIDOCAINE HCL (PF) 1 % IJ SOLN
INTRAMUSCULAR | Status: DC | PRN
  Administered 2019-04-16: 60 mL

## 2019-04-16 MED ORDER — SODIUM CHLORIDE 0.9 % IV SOLN
80.0000 mg | INTRAVENOUS | Status: AC
  Administered 2019-04-16: 80 mg

## 2019-04-16 MED ORDER — ACETAMINOPHEN 325 MG PO TABS: 325.0000 mg | ORAL_TABLET | ORAL | Status: DC | PRN

## 2019-04-16 MED ORDER — ONDANSETRON HCL 4 MG/2ML IJ SOLN: 4.0000 mg | Freq: Four times a day (QID) | INTRAMUSCULAR | Status: DC | PRN

## 2019-04-16 MED ORDER — CEFAZOLIN SODIUM-DEXTROSE 2-4 GM/100ML-% IV SOLN
INTRAVENOUS | Status: AC
  Filled 2019-04-16: qty 100

## 2019-04-16 MED ORDER — SODIUM CHLORIDE 0.9 % IV SOLN: INTRAVENOUS | Status: DC

## 2019-04-16 MED ORDER — CHLORHEXIDINE GLUCONATE 4 % EX LIQD: 60.0000 mL | Freq: Once | CUTANEOUS | Status: DC

## 2019-04-16 MED ORDER — MUPIROCIN 2 % EX OINT
TOPICAL_OINTMENT | CUTANEOUS | Status: AC
  Filled 2019-04-16: qty 22

## 2019-04-16 MED ORDER — CEFAZOLIN SODIUM-DEXTROSE 2-4 GM/100ML-% IV SOLN
2.0000 g | INTRAVENOUS | Status: AC
  Administered 2019-04-16: 15:00:00 2 g via INTRAVENOUS

## 2019-04-16 MED ORDER — SODIUM CHLORIDE 0.9 % IV SOLN
INTRAVENOUS | Status: AC
  Filled 2019-04-16: qty 2

## 2019-04-16 MED ORDER — LIDOCAINE HCL (PF) 1 % IJ SOLN
INTRAMUSCULAR | Status: AC
  Filled 2019-04-16: qty 60

## 2019-04-16 SURGICAL SUPPLY — 5 items
CABLE SURGICAL S-101-97-12 (CABLE) ×2 IMPLANT
IPG PACE AZUR XT DR MRI W1DR01 (Pacemaker) IMPLANT
PACE AZURE XT DR MRI W1DR01 (Pacemaker) ×2 IMPLANT
PAD PRO RADIOLUCENT 2001M-C (PAD) ×2 IMPLANT
TRAY PACEMAKER INSERTION (PACKS) ×2 IMPLANT

## 2019-04-16 NOTE — Discharge Instructions (Signed)
Pacemaker Battery Change, Care After This sheet gives you information about how to care for yourself after your procedure. Your health care provider may also give you more specific instructions. If you have problems or questions, contact your health care provider. What can I expect after the procedure? After your procedure, it is common to have:  Pain or soreness at the site where the pacemaker was inserted.  Swelling at the site where the pacemaker was inserted. Follow these instructions at home: Incision care   Keep the incision clean and dry. ? Do not take baths, swim, or use a hot tub until your health care provider approves   Follow instructions from your health care provider about how to take care of your incision. Make sure you: ? Wash your hands with soap and water before you change your bandage (dressing). If soap and water are not available, use hand sanitizer. ? Change your dressing as told by your health care provider. Remove outer dressing tomorrow at 5pm; leave steri-strips in place ? Leave stitches (sutures), skin glue, or adhesive strips in place. These skin closures may need to stay in place for 2 weeks or longer. If adhesive strip edges start to loosen and curl up, you may trim the loose edges. Do not remove adhesive strips completely unless your health care provider tells you to do that.  Check your incision area every day for signs of infection. Check for: ? More redness, swelling, or pain. ? More fluid or blood. ? Warmth. ? Pus or a bad smell. Activity  Do not lift anything that is heavier than 10 lb (4.5 kg) until your health care provider says it is okay to do so.  For the first 2 weeks, or as long as told by your health care provider: ? Avoid lifting your left arm higher than your shoulder. ? Be gentle when you move your arms over your head. It is okay to raise your arm to comb your hair. ? Avoid strenuous exercise.  Ask your health care provider when it is  okay to: ? Resume your normal activities. ? Return to work or school. ? Resume sexual activity. Eating and drinking  Eat a heart-healthy diet. This should include plenty of fresh fruits and vegetables, whole grains, low-fat dairy products, and lean protein like chicken and fish.  Limit alcohol intake to no more than 1 drink a day for non-pregnant women and 2 drinks a day for men. One drink equals 12 oz of beer, 5 oz of wine, or 1 oz of hard liquor.  Check ingredients and nutrition facts on packaged foods and beverages. Avoid the following types of food: ? Food that is high in salt (sodium). ? Food that is high in saturated fat, like full-fat dairy or red meat. ? Food that is high in trans fat, like fried food. ? Food and drinks that are high in sugar. Lifestyle  Do not use any products that contain nicotine or tobacco, such as cigarettes and e-cigarettes. If you need help quitting, ask your health care provider.  Take steps to manage and control your weight.  Get regular exercise. Aim for 150 minutes of moderate-intensity exercise (such as walking or yoga) or 75 minutes of vigorous exercise (such as running or swimming) each week.  Manage other health problems, such as diabetes or high blood pressure. Ask your health care provider how you can manage these conditions. General instructions  Do not drive for 24 hours after your procedure if you were given  a medicine to help you relax (sedative).  Take over-the-counter and prescription medicines only as told by your health care provider.  Avoid putting pressure on the area where the pacemaker was placed.  If you need an MRI after your pacemaker has been placed, be sure to tell the health care provider who orders the MRI that you have a pacemaker.  Avoid close and prolonged exposure to electrical devices that have strong magnetic fields. These include: ? Cell phones. Avoid keeping them in a pocket near the pacemaker, and try using the  ear opposite the pacemaker. ? MP3 players. ? Household appliances, like microwaves. ? Metal detectors. ? Electric generators. ? High-tension wires.  Keep all follow-up visits as directed by your health care provider. This is important. Contact a health care provider if:  You have pain at the incision site that is not relieved by over-the-counter or prescription medicines.  You have any of these around your incision site or coming from it: ? More redness, swelling, or pain. ? Fluid or blood. ? Warmth to the touch. ? Pus or a bad smell.  You have a fever.  You feel brief, occasional palpitations, light-headedness, or any symptoms that you think might be related to your heart. Get help right away if:  You experience chest pain that is different from the pain at the pacemaker site.  You develop a red streak that extends above or below the incision site.  You experience shortness of breath.  You have palpitations or an irregular heartbeat.  You have light-headedness that does not go away quickly.  You faint or have dizzy spells.  Your pulse suddenly drops or increases rapidly and does not return to normal.  You begin to gain weight and your legs and ankles swell. Summary  After your procedure, it is common to have pain, soreness, and some swelling where the pacemaker was inserted.  Make sure to keep your incision clean and dry. Follow instructions from your health care provider about how to take care of your incision.  Check your incision every day for signs of infection, such as more pain or swelling, pus or a bad smell, warmth, or leaking fluid and blood.  Avoid strenuous exercise and lifting your left arm higher than your shoulder for 2 weeks, or as long as told by your health care provider. This information is not intended to replace advice given to you by your health care provider. Make sure you discuss any questions you have with your health care provider. Document  Released: 06/20/2013 Document Revised: 08/12/2017 Document Reviewed: 07/22/2016 Elsevier Patient Education  2020 [employer].

## 2019-04-16 NOTE — Interval H&P Note (Signed)
History and Physical Interval Note:  04/16/2019 2:39 PM  [name]  has presented today for surgery, with the diagnosis of ERI.  The various methods of treatment have been discussed with the patient and family. After consideration of risks, benefits and other options for treatment, the patient has consented to  Procedure(s): PPM GENERATOR CHANGEOUT (N/A) as a surgical intervention.  The patient's history has been reviewed, patient examined, no change in status, stable for surgery.  I have reviewed the patient's chart and labs.  Questions were answered to the patient's satisfaction.     Maria [name]

## 2019-04-16 NOTE — Progress Notes (Signed)
Dr Lovena Le in and removed outer dressing and per Dr Lovena Le ok to d/c home

## 2019-04-17 ENCOUNTER — Encounter (HOSPITAL_COMMUNITY): Payer: Self-pay | Admitting: Internal Medicine

## 2019-04-18 MED FILL — Gentamicin Sulfate Inj 40 MG/ML: INTRAMUSCULAR | Qty: 80 | Status: AC

## 2019-04-19 NOTE — Progress Notes (Signed)
Remote pacemaker transmission.   

## 2019-05-03 ENCOUNTER — Other Ambulatory Visit: Payer: Self-pay

## 2019-05-03 ENCOUNTER — Ambulatory Visit (INDEPENDENT_AMBULATORY_CARE_PROVIDER_SITE_OTHER): Payer: Medicare Other | Admitting: Student

## 2019-05-03 DIAGNOSIS — I442 Atrioventricular block, complete: Secondary | ICD-10-CM | POA: Diagnosis not present

## 2019-05-03 LAB — CUP PACEART INCLINIC DEVICE CHECK
Battery Remaining Longevity: 142 mo
Battery Voltage: 3.2 V
Brady Statistic AP VP Percent: 21.52 %
Brady Statistic AP VS Percent: 0 %
Brady Statistic AS VP Percent: 77.63 %
Brady Statistic AS VS Percent: 0.85 %
Brady Statistic RA Percent Paced: 22.08 %
Brady Statistic RV Percent Paced: 99.15 %
Date Time Interrogation Session: 20200820142245
Implantable Lead Implant Date: 20030311
Implantable Lead Implant Date: 20030311
Implantable Lead Location: 753859
Implantable Lead Location: 753860
Implantable Lead Model: 4092
Implantable Lead Model: 4592
Implantable Pulse Generator Implant Date: 20200803
Lead Channel Impedance Value: 437 Ohm
Lead Channel Impedance Value: 513 Ohm
Lead Channel Impedance Value: 513 Ohm
Lead Channel Impedance Value: 608 Ohm
Lead Channel Pacing Threshold Amplitude: 0.375 V
Lead Channel Pacing Threshold Amplitude: 0.625 V
Lead Channel Pacing Threshold Pulse Width: 0.4 ms
Lead Channel Pacing Threshold Pulse Width: 0.4 ms
Lead Channel Sensing Intrinsic Amplitude: 2.125 mV
Lead Channel Sensing Intrinsic Amplitude: 2.25 mV
Lead Channel Setting Pacing Amplitude: 1.5 V
Lead Channel Setting Pacing Amplitude: 2.5 V
Lead Channel Setting Pacing Pulse Width: 0.4 ms
Lead Channel Setting Sensing Sensitivity: 4 mV

## 2019-05-03 NOTE — Progress Notes (Signed)
Wound check appointment s/p gen change. Steri-strips removed. Wound without redness or edema. Incision edges approximated, wound well healed. Normal device function. Chronic leads with Thresholds, sensing, and impedances stable. Histogram distribution appropriate for patient and level of activity. 1 episode of NSVT noted 04/23/2019. Asymptomatic. Patient educated about wound care, arm mobility, and lifting restrictions.

## 2019-05-08 DIAGNOSIS — Z1231 Encounter for screening mammogram for malignant neoplasm of breast: Secondary | ICD-10-CM | POA: Diagnosis not present

## 2019-05-08 DIAGNOSIS — Z13820 Encounter for screening for osteoporosis: Secondary | ICD-10-CM | POA: Diagnosis not present

## 2019-05-08 DIAGNOSIS — I10 Essential (primary) hypertension: Secondary | ICD-10-CM | POA: Diagnosis not present

## 2019-05-08 DIAGNOSIS — Z1211 Encounter for screening for malignant neoplasm of colon: Secondary | ICD-10-CM | POA: Diagnosis not present

## 2019-05-08 DIAGNOSIS — R5383 Other fatigue: Secondary | ICD-10-CM | POA: Diagnosis not present

## 2019-05-08 DIAGNOSIS — M160 Bilateral primary osteoarthritis of hip: Secondary | ICD-10-CM | POA: Diagnosis not present

## 2019-05-08 DIAGNOSIS — Z23 Encounter for immunization: Secondary | ICD-10-CM | POA: Diagnosis not present

## 2019-05-08 DIAGNOSIS — Z0001 Encounter for general adult medical examination with abnormal findings: Secondary | ICD-10-CM | POA: Diagnosis not present

## 2019-05-08 DIAGNOSIS — E782 Mixed hyperlipidemia: Secondary | ICD-10-CM | POA: Diagnosis not present

## 2019-05-08 DIAGNOSIS — E1165 Type 2 diabetes mellitus with hyperglycemia: Secondary | ICD-10-CM | POA: Diagnosis not present

## 2019-05-14 DIAGNOSIS — Z961 Presence of intraocular lens: Secondary | ICD-10-CM | POA: Diagnosis not present

## 2019-05-14 DIAGNOSIS — H35033 Hypertensive retinopathy, bilateral: Secondary | ICD-10-CM | POA: Diagnosis not present

## 2019-05-14 DIAGNOSIS — H04123 Dry eye syndrome of bilateral lacrimal glands: Secondary | ICD-10-CM | POA: Diagnosis not present

## 2019-05-14 DIAGNOSIS — H353131 Nonexudative age-related macular degeneration, bilateral, early dry stage: Secondary | ICD-10-CM | POA: Diagnosis not present

## 2019-05-14 DIAGNOSIS — H40023 Open angle with borderline findings, high risk, bilateral: Secondary | ICD-10-CM | POA: Diagnosis not present

## 2019-07-03 DIAGNOSIS — H0102B Squamous blepharitis left eye, upper and lower eyelids: Secondary | ICD-10-CM | POA: Diagnosis not present

## 2019-07-03 DIAGNOSIS — H16223 Keratoconjunctivitis sicca, not specified as Sjogren's, bilateral: Secondary | ICD-10-CM | POA: Diagnosis not present

## 2019-07-03 DIAGNOSIS — H353131 Nonexudative age-related macular degeneration, bilateral, early dry stage: Secondary | ICD-10-CM | POA: Diagnosis not present

## 2019-07-03 DIAGNOSIS — H0102A Squamous blepharitis right eye, upper and lower eyelids: Secondary | ICD-10-CM | POA: Diagnosis not present

## 2019-07-03 DIAGNOSIS — H40023 Open angle with borderline findings, high risk, bilateral: Secondary | ICD-10-CM | POA: Diagnosis not present

## 2019-07-03 DIAGNOSIS — Z961 Presence of intraocular lens: Secondary | ICD-10-CM | POA: Diagnosis not present

## 2019-07-17 ENCOUNTER — Encounter: Payer: Medicare Other | Admitting: Internal Medicine

## 2019-07-17 ENCOUNTER — Encounter (HOSPITAL_COMMUNITY): Payer: Self-pay

## 2019-07-17 ENCOUNTER — Other Ambulatory Visit: Payer: Self-pay

## 2019-07-17 ENCOUNTER — Emergency Department (HOSPITAL_COMMUNITY): Payer: Medicare Other

## 2019-07-17 ENCOUNTER — Emergency Department (HOSPITAL_COMMUNITY)
Admission: EM | Admit: 2019-07-17 | Discharge: 2019-07-18 | Disposition: A | Discharge status: Home / Self Care | Payer: Medicare Other | Attending: Emergency Medicine | Admitting: Emergency Medicine

## 2019-07-17 ENCOUNTER — Ambulatory Visit (INDEPENDENT_AMBULATORY_CARE_PROVIDER_SITE_OTHER): Payer: Medicare Other | Admitting: *Deleted

## 2019-07-17 DIAGNOSIS — Z950 Presence of cardiac pacemaker: Secondary | ICD-10-CM | POA: Insufficient documentation

## 2019-07-17 DIAGNOSIS — M2550 Pain in unspecified joint: Secondary | ICD-10-CM | POA: Insufficient documentation

## 2019-07-17 DIAGNOSIS — I442 Atrioventricular block, complete: Secondary | ICD-10-CM

## 2019-07-17 DIAGNOSIS — I10 Essential (primary) hypertension: Secondary | ICD-10-CM | POA: Insufficient documentation

## 2019-07-17 DIAGNOSIS — M1612 Unilateral primary osteoarthritis, left hip: Secondary | ICD-10-CM | POA: Diagnosis not present

## 2019-07-17 DIAGNOSIS — E119 Type 2 diabetes mellitus without complications: Secondary | ICD-10-CM | POA: Diagnosis not present

## 2019-07-17 DIAGNOSIS — R531 Weakness: Secondary | ICD-10-CM | POA: Diagnosis not present

## 2019-07-17 DIAGNOSIS — R0602 Shortness of breath: Secondary | ICD-10-CM | POA: Diagnosis not present

## 2019-07-17 DIAGNOSIS — Z79899 Other long term (current) drug therapy: Secondary | ICD-10-CM | POA: Insufficient documentation

## 2019-07-17 DIAGNOSIS — Z7984 Long term (current) use of oral hypoglycemic drugs: Secondary | ICD-10-CM | POA: Insufficient documentation

## 2019-07-17 LAB — CUP PACEART REMOTE DEVICE CHECK
Battery Remaining Longevity: 132 mo
Battery Voltage: 3.16 V
Brady Statistic AP VP Percent: 5.58 %
Brady Statistic AP VS Percent: 0 %
Brady Statistic AS VP Percent: 92.65 %
Brady Statistic AS VS Percent: 1.78 %
Brady Statistic RA Percent Paced: 5.98 %
Brady Statistic RV Percent Paced: 98.22 %
Date Time Interrogation Session: 20201103104720
Implantable Lead Implant Date: 20030311
Implantable Lead Implant Date: 20030311
Implantable Lead Location: 753859
Implantable Lead Location: 753860
Implantable Lead Model: 4092
Implantable Lead Model: 4592
Implantable Pulse Generator Implant Date: 20200803
Lead Channel Impedance Value: 361 Ohm
Lead Channel Impedance Value: 418 Ohm
Lead Channel Impedance Value: 437 Ohm
Lead Channel Impedance Value: 456 Ohm
Lead Channel Pacing Threshold Amplitude: 0.375 V
Lead Channel Pacing Threshold Amplitude: 0.75 V
Lead Channel Pacing Threshold Pulse Width: 0.4 ms
Lead Channel Pacing Threshold Pulse Width: 0.4 ms
Lead Channel Sensing Intrinsic Amplitude: 1.5 mV
Lead Channel Sensing Intrinsic Amplitude: 1.5 mV
Lead Channel Sensing Intrinsic Amplitude: 15.5 mV
Lead Channel Sensing Intrinsic Amplitude: 15.5 mV
Lead Channel Setting Pacing Amplitude: 1.5 V
Lead Channel Setting Pacing Amplitude: 2.5 V
Lead Channel Setting Pacing Pulse Width: 0.4 ms
Lead Channel Setting Sensing Sensitivity: 4 mV

## 2019-07-17 LAB — URINALYSIS, ROUTINE W REFLEX MICROSCOPIC
Bilirubin Urine: NEGATIVE
Glucose, UA: NEGATIVE mg/dL
Hgb urine dipstick: NEGATIVE
Ketones, ur: NEGATIVE mg/dL
Nitrite: NEGATIVE
Protein, ur: 30 mg/dL — AB
Specific Gravity, Urine: 1.009 (ref 1.005–1.030)
pH: 6 (ref 5.0–8.0)

## 2019-07-17 LAB — CBC WITH DIFFERENTIAL/PLATELET
Abs Immature Granulocytes: 0.04 10*3/uL (ref 0.00–0.07)
Basophils Absolute: 0 10*3/uL (ref 0.0–0.1)
Basophils Relative: 0 %
Eosinophils Absolute: 0.1 10*3/uL (ref 0.0–0.5)
Eosinophils Relative: 1 %
HCT: 39.2 % (ref 36.0–46.0)
Hemoglobin: 13.1 g/dL (ref 12.0–15.0)
Immature Granulocytes: 1 %
Lymphocytes Relative: 39 %
Lymphs Abs: 2.6 10*3/uL (ref 0.7–4.0)
MCH: 29.7 pg (ref 26.0–34.0)
MCHC: 33.4 g/dL (ref 30.0–36.0)
MCV: 88.9 fL (ref 80.0–100.0)
Monocytes Absolute: 0.5 10*3/uL (ref 0.1–1.0)
Monocytes Relative: 8 %
Neutro Abs: 3.4 10*3/uL (ref 1.7–7.7)
Neutrophils Relative %: 51 %
Platelets: 216 10*3/uL (ref 150–400)
RBC: 4.41 MIL/uL (ref 3.87–5.11)
RDW: 14.2 % (ref 11.5–15.5)
WBC: 6.7 10*3/uL (ref 4.0–10.5)
nRBC: 0.4 % — ABNORMAL HIGH (ref 0.0–0.2)

## 2019-07-17 LAB — COMPREHENSIVE METABOLIC PANEL
ALT: 15 U/L (ref 0–44)
AST: 22 U/L (ref 15–41)
Albumin: 4 g/dL (ref 3.5–5.0)
Alkaline Phosphatase: 43 U/L (ref 38–126)
Anion gap: 10 (ref 5–15)
BUN: 12 mg/dL (ref 8–23)
CO2: 20 mmol/L — ABNORMAL LOW (ref 22–32)
Calcium: 9.3 mg/dL (ref 8.9–10.3)
Chloride: 103 mmol/L (ref 98–111)
Creatinine, Ser: 0.8 mg/dL (ref 0.44–1.00)
GFR calc Af Amer: >60 mL/min (ref >60)
GFR calc non Af Amer: >60 mL/min (ref >60)
Glucose, Bld: 138 mg/dL — ABNORMAL HIGH (ref 70–99)
Potassium: 3.9 mmol/L (ref 3.5–5.1)
Sodium: 133 mmol/L — ABNORMAL LOW (ref 135–145)
Total Bilirubin: 1 mg/dL (ref 0.3–1.2)
Total Protein: 7.5 g/dL (ref 6.5–8.1)

## 2019-07-17 LAB — CBG MONITORING, ED: Glucose-Capillary: 131 mg/dL — ABNORMAL HIGH (ref 70–99)

## 2019-07-17 LAB — TROPONIN I (HIGH SENSITIVITY): Troponin I (High Sensitivity): 20 ng/L — ABNORMAL HIGH (ref <18)

## 2019-07-17 MED ORDER — HYDROCODONE-ACETAMINOPHEN 5-325 MG PO TABS
1.0000 | ORAL_TABLET | Freq: Once | ORAL | Status: AC
  Administered 2019-07-17: 23:00:00 1 via ORAL
  Filled 2019-07-17: qty 1

## 2019-07-17 NOTE — ED Notes (Signed)
Patient transported to X-ray 

## 2019-07-17 NOTE — ED Triage Notes (Signed)
Pt arrives via [facility] c/o malaise x2 weeks. A&Ox4. VS: BP 200/120, RR 20, SPO2 100% RA, HR 100. PMHX: pacemaker.

## 2019-07-17 NOTE — ED Provider Notes (Signed)
[facility] EMERGENCY DEPARTMENT Provider Note   CSN: [id number] Arrival date & time: 07/17/19  2051     History   Chief Complaint Chief Complaint  Patient presents with  . Weakness  . Hypertension    HPI Maria [name] is a 85 y.o. female.     HPI Patient reports that she hurts all over.  She reports that it is her "arthritis".  She reports that it hurts in her shoulders and wrist.  Also hurts in her hips and legs.  She reports lately the left hip has been bothering her most.  She reports she does use a walker.  He reports she has never had any pain medicine to take this really help.  She tries Tylenol.  I did speak with patient's daughter.  She can occurs that the patient's presenting problem is pain that is fairly chronic and due to her arthritis.  She reports she has never had any stronger pain medications to try.  Both the patient's daughter and son check in on her several times a day.  No injuries or falls. Past Medical History:  Diagnosis Date  . Anxiety   . Arthritis   . Cancer ([place])    breast 2015  . Cardiac pacemaker in situ   . Chest pain   . Diabetes mellitus without complication ([place])   . DM type 2 (diabetes mellitus, type 2) ([place])   . Dyslipidemia   . Dyspnea    with exertion  . Family history of adverse reaction to anesthesia    daughter slow to wake up  . Gallstones   . Headache   . Hypertension   . Macular degeneration   . Pulmonary hypertension (HCC)    PA peak pressure 81 mmHg by 09/2016 echo  . Type II or unspecified type diabetes mellitus without mention of complication, not stated as uncontrolled   . Wears partial dentures     Patient Active Problem List   Diagnosis Date Noted  . Heart block AV complete ([place]) 04/13/2019  . Acute acalculous cholecystitis 10/07/2016  . Elevated troponin 10/07/2016  . Lactic acidosis 10/07/2016  . Sinus tachycardia 10/07/2016  . Dyslipidemia 06/05/2013  . Cardiac pacemaker 05/30/2013  . DM  type 2 (diabetes mellitus, type 2) ([place]) 05/21/2013  . Chest pain 05/20/2013  . HTN (hypertension) 05/20/2013    Past Surgical History:  Procedure Laterality Date  . BREAST EXCISIONAL BIOPSY Right 2010  . CATARACT EXTRACTION    . CHOLECYSTECTOMY N/A 01/18/2017   Procedure: LAPAROSCOPIC CHOLECYSTECTOMY;  Surgeon: Ralene Ok, MD;  Location: [place];  Service: General;  Laterality: N/A;  . IR GENERIC HISTORICAL  10/08/2016   IR PERC CHOLECYSTOSTOMY 10/08/2016 Greggory Keen, MD [facility]-INTERV RAD  . IR RADIOLOGIST EVAL & MGMT  11/16/2016  . KNEE SURGERY Bilateral   . MULTIPLE TOOTH EXTRACTIONS    . PACEMAKER INSERTION     [employer]tr[employer], [facility] 2003  . PACEMAKER INSERTION    . PPM GENERATOR CHANGEOUT N/A 04/16/2019   Procedure: PPM GENERATOR CHANGEOUT;  Surgeon: Evans Lance, MD;  Location: [place] [facility];  Service: Cardiovascular;  Laterality: N/A;  . TOTAL KNEE ARTHROPLASTY Bilateral      OB History    Gravida  0   Para  0   Term  0   Preterm  0   AB  0   Living        SAB  0   TAB  0   Ectopic  0   Multiple      Live Births               Home Medications    Prior to Admission medications   Medication Sig Start Date End Date Taking? Authorizing Provider  acetaminophen (TYLENOL) 500 MG tablet Take 1,000 mg by mouth every 6 (six) hours as needed for mild pain.     [provider]  Blood Glucose Monitoring Suppl (FREESTYLE LITE) DEVI  08/28/14   [provider]  cephALEXin (KEFLEX) 500 MG capsule Take 2 capsules (1,000 mg total) by mouth 2 (two) times daily. 07/18/19   Charlesetta Shanks, MD  FREESTYLE LITE test strip  08/28/14   [provider]  glimepiride (AMARYL) 1 MG tablet Take 1 mg by mouth daily. 03/08/19   [provider]  hydrochlorothiazide (HYDRODIURIL) 12.5 MG tablet Take 12.5 mg by mouth daily. 12/04/18   [provider]  HYDROcodone-acetaminophen (NORCO/VICODIN) 5-325 MG tablet Take 1 tablet by mouth every  6 (six) hours as needed for moderate pain or severe pain. 07/18/19   Charlesetta Shanks, MD  Lancets (FREESTYLE) lancets  08/28/14   [provider]  losartan (COZAAR) 50 MG tablet Take 50 mg by mouth daily. 12/04/18   [provider]  meclizine (ANTIVERT) 25 MG tablet Take 25 mg by mouth 3 (three) times daily as needed for dizziness.    [provider]  Menthol-Methyl Salicylate (SALONPAS PAIN RELIEF PATCH EX) Place 1 patch onto the skin daily as needed (back pain.).    [provider]  metFORMIN (GLUCOPHAGE) 1000 MG tablet Take 1,000 mg by mouth daily with breakfast.  09/09/14   [provider]  Multiple Vitamins-Minerals (PRESERVISION AREDS 2 PO) Take 1 tablet by mouth daily.    [provider]  omeprazole (PRILOSEC) 20 MG capsule Take 20 mg by mouth daily before breakfast.  10/10/18   [provider]  ondansetron (ZOFRAN ODT) 4 MG disintegrating tablet Take 1 tablet (4 mg total) by mouth every 8 (eight) hours as needed for nausea or vomiting. 12/19/18   Darlin Drop P, PA-C  Polyvinyl Alcohol-Povidone (REFRESH OP) Apply 1 drop to eye daily.     [provider]  RESTASIS 0.05 % ophthalmic emulsion Place 1 drop into both eyes daily. 03/26/19   [provider]  rosuvastatin (CRESTOR) 5 MG tablet Take 5 mg by mouth at bedtime. 11/15/18   [provider]  Trolamine Salicylate (ASPERCREME EX) Apply 1 application topically daily as needed (muscle aches).    [provider]  valACYclovir (VALTREX) 1000 MG tablet Take 1 g by mouth daily as needed (cold sore/fever blisters).  11/23/18   [provider]    Family History Family History  Problem Relation Age of Onset  . Colon cancer Neg Hx     Social History Social History   Tobacco Use  . Smoking status: Never Smoker  . Smokeless tobacco: Current User    Types: Snuff  Substance Use Topics  . Alcohol use: No    Alcohol/week: 1.0 standard drinks     Types: 1 Cans of beer per week  . Drug use: No     Allergies   Patient has no known allergies.   Review of Systems Review of Systems 10 Systems reviewed and are negative for acute change except as noted in the HPI.   Physical Exam Updated Vital Signs BP (!) 185/106 (BP Location: Right Arm)   Pulse 98   Temp 98.5 F (  36.9 C) (Oral)   Resp 16   Ht 5\' 4"  (1.626 m)   Wt 68 kg   SpO2 99%   BMI 25.75 kg/m   Physical Exam Constitutional:      Appearance: Normal appearance.  HENT:     Head: Normocephalic and atraumatic.     Mouth/Throat:     Mouth: Mucous membranes are moist.     Pharynx: Oropharynx is clear.  Eyes:     Extraocular Movements: Extraocular movements intact.  Cardiovascular:     Rate and Rhythm: Normal rate and regular rhythm.  Pulmonary:     Effort: No respiratory distress.     Breath sounds: Normal breath sounds. No wheezing.  Abdominal:     Palpations: Abdomen is soft.     Comments: Abdomen is soft.  Very mild suprapubic discomfort to palpation.  Musculoskeletal: Normal range of motion.        General: No swelling or tenderness.     Right lower leg: No edema.     Left lower leg: No edema.     Comments: Patient has arthritic changes of hands and knees.  There is no effusion or erythema associated.  Patient can go through flexion extension range of motion of both lower extremities without significant difficulty.  Distal pulses 2+ and symmetric both feet are warm and dry.  Neurological:     General: No focal deficit present.     Mental Status: She is alert and oriented to person, place, and time.     Coordination: Coordination normal.  Psychiatric:        Mood and Affect: Mood normal.      ED Treatments / Results  Labs (all labs ordered are listed, but only abnormal results are displayed) Labs Reviewed  COMPREHENSIVE METABOLIC PANEL - Abnormal; Notable for the following components:      Result Value   Sodium 133 (*)    CO2 20 (*)    Glucose,  Bld 138 (*)    All other components within normal limits  CBC WITH DIFFERENTIAL/PLATELET - Abnormal; Notable for the following components:   nRBC 0.4 (*)    All other components within normal limits  URINALYSIS, ROUTINE W REFLEX MICROSCOPIC - Abnormal; Notable for the following components:   Protein, ur 30 (*)    Leukocytes,Ua SMALL (*)    Bacteria, UA RARE (*)    All other components within normal limits  CBG MONITORING, ED - Abnormal; Notable for the following components:   Glucose-Capillary 131 (*)    All other components within normal limits  TROPONIN I (HIGH SENSITIVITY) - Abnormal; Notable for the following components:   Troponin I (High Sensitivity) 20 (*)    All other components within normal limits  URINE CULTURE  TROPONIN I (HIGH SENSITIVITY)    EKG None  Radiology Dg Hip Unilat With Pelvis 2-3 Views Left  Result Date: 07/17/2019 CLINICAL DATA:  Chronic bilateral hip pain EXAM: DG HIP (WITH OR WITHOUT PELVIS) 2-3V LEFT COMPARISON:  06/30/2017 FINDINGS: There is moderate to severe bilateral osteoarthritis, left greater than right. This has progressed since 2018. There is no displaced fracture. No dislocation. Phleboliths project over the patient's pelvis. There are atherosclerotic changes throughout the visualized vascular structures. IMPRESSION: Moderate to severe bilateral osteoarthritis, left greater than right. Electronically Signed   By: Constance Holster M.D.   On: 07/17/2019 23:22    Procedures Procedures (including critical care time)  Medications Ordered in ED Medications  cephALEXin (KEFLEX) capsule 1,000 mg (has no  administration in time range)  HYDROcodone-acetaminophen (NORCO/VICODIN) 5-325 MG per tablet 1 tablet (1 tablet Oral Given 07/17/19 2322)     Initial Impression / Assessment and Plan / ED Course  I have reviewed the triage vital signs and the nursing notes.  Pertinent labs & imaging results that were available during my care of the patient were  reviewed by me and considered in my medical decision making (see chart for details).       Patient presents with chief complaint of chronic arthritis pain.  Her daughter reports feeling she over takes at home is Tylenol.  There have not been any injuries or acute changes.  Patient however complained of some increasing left hip pain.  X-rays do not show acute findings.  Will provide a short course of Vicodin to be used judiciously for pain control.  I have discussed this with the patient's daughter over the phone.  Also, incidentally the urine appears slightly positive.  Patient endorses slight discomfort palpation over the bladder.  Will opt to treat.  Return precautions included.  Patient is clinically well in appearance.  Final Clinical Impressions(s) / ED Diagnoses   Final diagnoses:  None    ED Discharge Orders         Ordered    cephALEXin (KEFLEX) 500 MG capsule  2 times daily     07/18/19 0001    HYDROcodone-acetaminophen (NORCO/VICODIN) 5-325 MG tablet  Every 6 hours PRN     07/18/19 0001           Charlesetta Shanks, MD 07/18/19 0030

## 2019-07-18 DIAGNOSIS — M2550 Pain in unspecified joint: Secondary | ICD-10-CM | POA: Diagnosis not present

## 2019-07-18 LAB — TROPONIN I (HIGH SENSITIVITY): Troponin I (High Sensitivity): 24 ng/L — ABNORMAL HIGH (ref <18)

## 2019-07-18 MED ORDER — HYDROCODONE-ACETAMINOPHEN 5-325 MG PO TABS: 1.0000 | ORAL_TABLET | Freq: Four times a day (QID) | ORAL | 0 refills | Status: DC | PRN

## 2019-07-18 MED ORDER — CEPHALEXIN 500 MG PO CAPS: 1000.0000 mg | ORAL_CAPSULE | Freq: Two times a day (BID) | ORAL | 0 refills | Status: DC

## 2019-07-18 MED ORDER — CEPHALEXIN 250 MG PO CAPS
1000.0000 mg | ORAL_CAPSULE | Freq: Once | ORAL | Status: AC
  Administered 2019-07-18: 1000 mg via ORAL
  Filled 2019-07-18: qty 4

## 2019-07-18 NOTE — Discharge Instructions (Signed)
1.  Your urine is mildly positive for infection.  A culture has been done.  Results should be available in 1 to 2 days.  You have been given an antibiotic to take. 2.  Your x-rays of your hip do not show any fractures.  You have severe chronic arthritis.  You have been given a prescription for Vicodin for additional pain control.  Take 1 Vicodin every 6 hours as needed for pain.  Vicodin can make you constipated.  Take a stool softener while you are taking it. 3.  See your doctor for recheck this week.

## 2019-07-18 NOTE — ED Notes (Signed)
Patient verbalizes understanding of discharge instructions. Opportunity for questioning and answers were provided. Armband removed by staff, pt discharged from ED.  

## 2019-07-19 ENCOUNTER — Other Ambulatory Visit: Payer: Self-pay

## 2019-07-19 ENCOUNTER — Other Ambulatory Visit: Payer: Self-pay | Admitting: Internal Medicine

## 2019-07-19 ENCOUNTER — Ambulatory Visit
Admission: RE | Admit: 2019-07-19 | Discharge: 2019-07-19 | Disposition: A | Discharge status: Home / Self Care | Payer: Medicare Other | Source: Ambulatory Visit | Attending: Internal Medicine | Admitting: Internal Medicine

## 2019-07-19 DIAGNOSIS — R5383 Other fatigue: Secondary | ICD-10-CM

## 2019-07-19 DIAGNOSIS — R05 Cough: Secondary | ICD-10-CM | POA: Diagnosis not present

## 2019-07-19 DIAGNOSIS — R0602 Shortness of breath: Secondary | ICD-10-CM | POA: Diagnosis not present

## 2019-07-19 DIAGNOSIS — D6489 Other specified anemias: Secondary | ICD-10-CM | POA: Diagnosis not present

## 2019-07-19 DIAGNOSIS — R6889 Other general symptoms and signs: Secondary | ICD-10-CM | POA: Diagnosis not present

## 2019-07-19 DIAGNOSIS — M6281 Muscle weakness (generalized): Secondary | ICD-10-CM | POA: Diagnosis not present

## 2019-07-19 DIAGNOSIS — E782 Mixed hyperlipidemia: Secondary | ICD-10-CM | POA: Diagnosis not present

## 2019-07-19 DIAGNOSIS — M48061 Spinal stenosis, lumbar region without neurogenic claudication: Secondary | ICD-10-CM | POA: Diagnosis not present

## 2019-07-19 DIAGNOSIS — I10 Essential (primary) hypertension: Secondary | ICD-10-CM | POA: Diagnosis not present

## 2019-07-19 LAB — URINE CULTURE: Culture: <10000 — AB

## 2019-07-23 DIAGNOSIS — M25551 Pain in right hip: Secondary | ICD-10-CM | POA: Diagnosis not present

## 2019-07-23 DIAGNOSIS — M25552 Pain in left hip: Secondary | ICD-10-CM | POA: Diagnosis not present

## 2019-07-25 DIAGNOSIS — M1612 Unilateral primary osteoarthritis, left hip: Secondary | ICD-10-CM | POA: Diagnosis not present

## 2019-07-26 DIAGNOSIS — D259 Leiomyoma of uterus, unspecified: Secondary | ICD-10-CM | POA: Diagnosis not present

## 2019-07-26 DIAGNOSIS — E1121 Type 2 diabetes mellitus with diabetic nephropathy: Secondary | ICD-10-CM | POA: Diagnosis not present

## 2019-07-26 DIAGNOSIS — I10 Essential (primary) hypertension: Secondary | ICD-10-CM | POA: Diagnosis not present

## 2019-07-26 DIAGNOSIS — E1142 Type 2 diabetes mellitus with diabetic polyneuropathy: Secondary | ICD-10-CM | POA: Diagnosis not present

## 2019-07-26 DIAGNOSIS — R5383 Other fatigue: Secondary | ICD-10-CM | POA: Diagnosis not present

## 2019-07-26 DIAGNOSIS — Z7984 Long term (current) use of oral hypoglycemic drugs: Secondary | ICD-10-CM | POA: Diagnosis not present

## 2019-07-26 DIAGNOSIS — D649 Anemia, unspecified: Secondary | ICD-10-CM | POA: Diagnosis not present

## 2019-07-26 DIAGNOSIS — N390 Urinary tract infection, site not specified: Secondary | ICD-10-CM | POA: Diagnosis not present

## 2019-07-26 DIAGNOSIS — M6281 Muscle weakness (generalized): Secondary | ICD-10-CM | POA: Diagnosis not present

## 2019-07-26 DIAGNOSIS — F17220 Nicotine dependence, chewing tobacco, uncomplicated: Secondary | ICD-10-CM | POA: Diagnosis not present

## 2019-07-26 DIAGNOSIS — M48061 Spinal stenosis, lumbar region without neurogenic claudication: Secondary | ICD-10-CM | POA: Diagnosis not present

## 2019-07-26 DIAGNOSIS — I8393 Asymptomatic varicose veins of bilateral lower extremities: Secondary | ICD-10-CM | POA: Diagnosis not present

## 2019-07-26 DIAGNOSIS — E782 Mixed hyperlipidemia: Secondary | ICD-10-CM | POA: Diagnosis not present

## 2019-07-27 ENCOUNTER — Ambulatory Visit (INDEPENDENT_AMBULATORY_CARE_PROVIDER_SITE_OTHER): Payer: Medicare Other | Admitting: Student

## 2019-07-27 ENCOUNTER — Other Ambulatory Visit: Payer: Self-pay

## 2019-07-27 VITALS — BP 148/83 | HR 66 | Ht 64.0 in | Wt 147.0 lb

## 2019-07-27 DIAGNOSIS — I442 Atrioventricular block, complete: Secondary | ICD-10-CM | POA: Diagnosis not present

## 2019-07-27 DIAGNOSIS — I10 Essential (primary) hypertension: Secondary | ICD-10-CM

## 2019-07-27 LAB — CUP PACEART INCLINIC DEVICE CHECK
Battery Remaining Longevity: 135 mo
Battery Voltage: 3.16 V
Brady Statistic AP VP Percent: 5.59 %
Brady Statistic AP VS Percent: 0 %
Brady Statistic AS VP Percent: 92.57 %
Brady Statistic AS VS Percent: 1.84 %
Brady Statistic RA Percent Paced: 6.02 %
Brady Statistic RV Percent Paced: 98.16 %
Date Time Interrogation Session: 20201113130507
Implantable Lead Implant Date: 20030311
Implantable Lead Implant Date: 20030311
Implantable Lead Location: 753859
Implantable Lead Location: 753860
Implantable Lead Model: 4092
Implantable Lead Model: 4592
Implantable Pulse Generator Implant Date: 20200803
Lead Channel Impedance Value: 399 Ohm
Lead Channel Impedance Value: 456 Ohm
Lead Channel Impedance Value: 475 Ohm
Lead Channel Impedance Value: 532 Ohm
Lead Channel Pacing Threshold Amplitude: 0.5 V
Lead Channel Pacing Threshold Amplitude: 0.75 V
Lead Channel Pacing Threshold Pulse Width: 0.4 ms
Lead Channel Pacing Threshold Pulse Width: 0.4 ms
Lead Channel Sensing Intrinsic Amplitude: 1.375 mV
Lead Channel Sensing Intrinsic Amplitude: 1.875 mV
Lead Channel Sensing Intrinsic Amplitude: 15.5 mV
Lead Channel Sensing Intrinsic Amplitude: 15.5 mV
Lead Channel Setting Pacing Amplitude: 1.5 V
Lead Channel Setting Pacing Amplitude: 2.5 V
Lead Channel Setting Pacing Pulse Width: 0.4 ms
Lead Channel Setting Sensing Sensitivity: 4 mV

## 2019-07-27 NOTE — Progress Notes (Addendum)
Electrophysiology Office Note Date: 07/27/2019  ID:  Maria [name], DOB October 20, 1923, MRN [id number]  PCP: Audley Hose, MD Primary Cardiologist: Cristopher Peru, MD Electrophysiologist: Dr. Lovena Le  CC: Pacemaker follow-up  Maria [name] is a 85 y.o. female seen today for Dr. Lovena Le. She presents today for routine electrophysiology followup and evaluation 3 months after gen change.  Since last being seen in our clinic, the patient reports doing well. She is not very active per her caregiver. Start PT next week. She has chronic dizziness with vertigo, and takes meclizine. she denies chest pain, palpitations, dyspnea, PND, orthopnea, nausea, vomiting, syncope, edema, weight gain, or early satiety.  Device History: Medtronic Dual Chamber PPM implanted 2003, gen change 2010, gen change 04/16/2019 for CHB  Past Medical History:  Diagnosis Date  . Anxiety   . Arthritis   . Cancer ([place])    breast 2015  . Cardiac pacemaker in situ   . Chest pain   . Diabetes mellitus without complication ([place])   . DM type 2 (diabetes mellitus, type 2) ([place])   . Dyslipidemia   . Dyspnea    with exertion  . Family history of adverse reaction to anesthesia    daughter slow to wake up  . Gallstones   . Headache   . Hypertension   . Macular degeneration   . Pulmonary hypertension (H[facility])    PA peak pressure 81 mmHg by 09/2016 echo  . Type II or unspecified type diabetes mellitus without mention of complication, not stated as uncontrolled   . Wears partial dentures    Past Surgical History:  Procedure Laterality Date  . BREAST EXCISIONAL BIOPSY Right 2010  . CATARACT EXTRACTION    . CHOLECYSTECTOMY N/A 01/18/2017   Procedure: LAPAROSCOPIC CHOLECYSTECTOMY;  Surgeon: Ralene Ok, MD;  Location: [place];  Service: General;  Laterality: N/A;  . IR GENERIC HISTORICAL  10/08/2016   IR PERC CHOLECYSTOSTOMY 10/08/2016 Greggory Keen, MD [facility]-INTER[facility] RAD  . IR RADIOLOGIST EVAL & MGMT  11/16/2016  . KNEE SURGERY  Bilateral   . MULTIPLE TOOTH EXTRACTIONS    . PACEMAKER INSERTION     Medtronic, [facility] 2003  . PACEMAKER INSERTION    . PPM GENERATOR CHANGEOUT N/A 04/16/2019   Procedure: PPM GENERATOR CHANGEOUT;  Surgeon: Evans Lance, MD;  Location: [place] [facility];  Service: Cardiovascular;  Laterality: N/A;  . TOTAL KNEE ARTHROPLASTY Bilateral     Current Outpatient Medications  Medication Sig Dispense Refill  . acetaminophen (TYLENOL) 500 MG tablet Take 1,000 mg by mouth every 6 (six) hours as needed for mild pain.     . Blood Glucose Monitoring Suppl (FREESTYLE LITE) DEVI   0  . cephALEXin (KEFLEX) 500 MG capsule Take 2 capsules (1,000 mg total) by mouth 2 (two) times daily. 28 capsule 0  . FREESTYLE LITE test strip   4  . glimepiride (AMARYL) 1 MG tablet Take 1 mg by mouth daily.    . hydrochlorothiazide (HYDRODIURIL) 12.5 MG tablet Take 12.5 mg by mouth daily.    Marland Kitchen HYDROcodone-acetaminophen (NORCO/VICODIN) 5-325 MG tablet Take 1 tablet by mouth every 6 (six) hours as needed for moderate pain or severe pain. 20 tablet 0  . Lancets (FREESTYLE) lancets   4  . losartan (COZAAR) 50 MG tablet Take 50 mg by mouth daily.    . meclizine (ANTIVERT) 25 MG tablet Take 25 mg by mouth 3 (three) times daily as needed for dizziness.    . Menthol-Methyl Salicylate (  SALONPAS PAIN RELIEF PATCH EX) Place 1 patch onto the skin daily as needed (back pain.).    Marland Kitchen metFORMIN (GLUCOPHAGE) 1000 MG tablet Take 1,000 mg by mouth daily with breakfast.     . Multiple Vitamins-Minerals (PRESERVISION AREDS 2 PO) Take 1 tablet by mouth daily.    Marland Kitchen omeprazole (PRILOSEC) 20 MG capsule Take 20 mg by mouth daily before breakfast.     . ondansetron (ZOFRAN ODT) 4 MG disintegrating tablet Take 1 tablet (4 mg total) by mouth every 8 (eight) hours as needed for nausea or vomiting. 20 tablet 0  . Polyvinyl Alcohol-Povidone (REFRESH OP) Apply 1 drop to eye daily.     . RESTASIS 0.05 % ophthalmic emulsion Place 1 drop into both  eyes daily.    [name] Salicylate (ASPERCREME EX) Apply 1 application topically daily as needed (muscle aches).    . valACYclovir (VALTREX) 1000 MG tablet Take 1 g by mouth daily as needed (cold sore/fever blisters).      No current facility-administered medications for this visit.     Allergies:   Patient has no known allergies.   Social History: Social History   Socioeconomic History  . Marital status: Widowed    Spouse name: Not on file  . Number of children: 2  . Years of education: Not on file  . Highest education level: Not on file  Occupational History  . Not on file  Social Needs  . Financial resource strain: Not on file  . Food insecurity    Worry: Not on file    Inability: Not on file  . Transportation needs    Medical: Not on file    Non-medical: Not on file  Tobacco Use  . Smoking status: Never Smoker  . Smokeless tobacco: Current User    Types: Snuff  Substance and Sexual Activity  . Alcohol use: No    Alcohol/week: 1.0 standard drinks    Types: 1 Cans of beer per week  . Drug use: No  . Sexual activity: Not on file  Lifestyle  . Physical activity    Days per week: Not on file    Minutes per session: Not on file  . Stress: Not on file  Relationships  . Social Herbalist on phone: Not on file    Gets together: Not on file    Attends religious service: Not on file    Active member of club or organization: Not on file    Attends meetings of clubs or organizations: Not on file    Relationship status: Not on file  . Intimate partner violence    Fear of current or ex partner: Not on file    Emotionally abused: Not on file    Physically abused: Not on file    Forced sexual activity: Not on file  Other Topics Concern  . Not on file  Social History Narrative   ** Merged History Encounter **        Family History: Family History  Problem Relation Age of Onset  . Colon cancer Neg Hx      Review of Systems: All other systems  reviewed and are otherwise negative except as noted above.  Physical Exam: Vitals:   07/27/19 1239  BP: (!) 148/83  Pulse: 66  Weight: 147 lb (66.7 kg)  Height: 5\' 4"  (1.626 m)     GEN- The patient is well appearing, alert and oriented x 3 today.   HEENT: normocephalic, atraumatic; sclera clear,  conjunctiva pink; hearing intact; oropharynx clear; neck supple  Lungs- Clear to ausculation bilaterally, normal work of breathing.  No wheezes, rales, rhonchi Heart- Regular rate and rhythm, no murmurs, rubs or gallops  GI- soft, non-tender, non-distended, bowel sounds present  Extremities- no clubbing, cyanosis, or edema  MS- no significant deformity or atrophy Skin- warm and dry, no rash or lesion; PPM pocket well healed Psych- euthymic mood, full affect Neuro- strength and sensation are intact  PPM Interrogation- reviewed in detail today,  See PACEART report  EKG:  EKG is ordered today. The ekg shows A sensed V paced at 66 bpm, personally reviewed.   Recent Labs: 09/11/2018: TSH 0.578 07/17/2019: ALT 15; BUN 12; Creatinine, Ser 0.80; Hemoglobin 13.1; Platelets 216; Potassium 3.9; Sodium 133   Wt Readings from Last 3 Encounters:  07/27/19 147 lb (66.7 kg)  07/17/19 150 lb (68 kg)  04/16/19 148 lb (67.1 kg)     Other studies Reviewed: Additional studies/ records that were reviewed today include: Echo 09/2016 shows LVEF 50-55%, Previous EP office notes, Previous remote checks, Most recent labwork.   Assessment and Plan:  1.  Symptomatic bradycardia due to CHB s/p Medtronic PPM  Normal PPM function; gen change site stable.  See Pace Art report No changes today  2. HTN Stable on current regimen. Would not aggressively manage. With limited mobility.   Current medicines are reviewed at length with the patient today.   The patient does not have concerns regarding her medicines.  The following changes were made today:  none  Labs/ tests ordered today include:  Orders Placed This  Encounter  Procedures  . HEART 12-Lead    Disposition:   Follow up with Dr. Lovena Le in 1 year.  Jacalyn Lefevre, PA-C  07/27/2019 12:56 PM  [address] [place] [address] [phone] (office) [phone] (fax)

## 2019-07-27 NOTE — Patient Instructions (Signed)
Medication Instructions:  Your physician recommends that you continue on your current medications as directed. Please refer to the Current Medication list given to you today.  *If you need a refill on your cardiac medications before your next appointment, please call your pharmacy*  Lab Work: NONE ORDERED  TODAY   If you have labs (blood work) drawn today and your tests are completely normal, you will receive your results only by: . MyChart Message (if you have MyChart) OR . A paper copy in the mail If you have any lab test that is abnormal or we need to change your treatment, we will call you to review the results.  Testing/Procedures: NONE ORDERED  TODAY    Follow-Up: At [facility], you and your health needs are our priority.  As part of our continuing mission to provide you with exceptional heart care, we have created designated Provider Care Teams.  These Care Teams include your primary Cardiologist (physician) and Advanced Practice Providers (APPs -  Physician Assistants and Nurse Practitioners) who all work together to provide you with the care you need, when you need it.  Your next appointment:   12 months  The format for your next appointment:   In Person  Provider:   You may see Dr. Taylor  or one of the following Advanced Practice Providers on your designated Care Team:    Amber Seiler, NP  Renee Ursuy, PA-C  Michael "Andy" Tillery, PA-C   Other Instructions   

## 2019-08-01 DIAGNOSIS — M48061 Spinal stenosis, lumbar region without neurogenic claudication: Secondary | ICD-10-CM | POA: Diagnosis not present

## 2019-08-06 NOTE — Progress Notes (Signed)
Remote pacemaker transmission.   

## 2019-08-15 DIAGNOSIS — N390 Urinary tract infection, site not specified: Secondary | ICD-10-CM | POA: Diagnosis not present

## 2019-08-23 DIAGNOSIS — M48061 Spinal stenosis, lumbar region without neurogenic claudication: Secondary | ICD-10-CM | POA: Diagnosis not present

## 2019-08-25 DIAGNOSIS — I10 Essential (primary) hypertension: Secondary | ICD-10-CM | POA: Diagnosis not present

## 2019-08-25 DIAGNOSIS — I8393 Asymptomatic varicose veins of bilateral lower extremities: Secondary | ICD-10-CM | POA: Diagnosis not present

## 2019-08-25 DIAGNOSIS — F17220 Nicotine dependence, chewing tobacco, uncomplicated: Secondary | ICD-10-CM | POA: Diagnosis not present

## 2019-08-25 DIAGNOSIS — D259 Leiomyoma of uterus, unspecified: Secondary | ICD-10-CM | POA: Diagnosis not present

## 2019-08-25 DIAGNOSIS — Z7984 Long term (current) use of oral hypoglycemic drugs: Secondary | ICD-10-CM | POA: Diagnosis not present

## 2019-08-25 DIAGNOSIS — N390 Urinary tract infection, site not specified: Secondary | ICD-10-CM | POA: Diagnosis not present

## 2019-08-25 DIAGNOSIS — M6281 Muscle weakness (generalized): Secondary | ICD-10-CM | POA: Diagnosis not present

## 2019-08-25 DIAGNOSIS — E1142 Type 2 diabetes mellitus with diabetic polyneuropathy: Secondary | ICD-10-CM | POA: Diagnosis not present

## 2019-08-25 DIAGNOSIS — E1121 Type 2 diabetes mellitus with diabetic nephropathy: Secondary | ICD-10-CM | POA: Diagnosis not present

## 2019-08-25 DIAGNOSIS — M48061 Spinal stenosis, lumbar region without neurogenic claudication: Secondary | ICD-10-CM | POA: Diagnosis not present

## 2019-08-25 DIAGNOSIS — D649 Anemia, unspecified: Secondary | ICD-10-CM | POA: Diagnosis not present

## 2019-08-28 DIAGNOSIS — E1142 Type 2 diabetes mellitus with diabetic polyneuropathy: Secondary | ICD-10-CM | POA: Diagnosis not present

## 2019-08-28 DIAGNOSIS — M6281 Muscle weakness (generalized): Secondary | ICD-10-CM | POA: Diagnosis not present

## 2019-08-28 DIAGNOSIS — N390 Urinary tract infection, site not specified: Secondary | ICD-10-CM | POA: Diagnosis not present

## 2019-08-28 DIAGNOSIS — M48061 Spinal stenosis, lumbar region without neurogenic claudication: Secondary | ICD-10-CM | POA: Diagnosis not present

## 2019-08-28 DIAGNOSIS — E1121 Type 2 diabetes mellitus with diabetic nephropathy: Secondary | ICD-10-CM | POA: Diagnosis not present

## 2019-08-28 DIAGNOSIS — D649 Anemia, unspecified: Secondary | ICD-10-CM | POA: Diagnosis not present

## 2019-08-29 DIAGNOSIS — E782 Mixed hyperlipidemia: Secondary | ICD-10-CM | POA: Diagnosis not present

## 2019-08-29 DIAGNOSIS — E119 Type 2 diabetes mellitus without complications: Secondary | ICD-10-CM | POA: Diagnosis not present

## 2019-08-29 DIAGNOSIS — F320 Major depressive disorder, single episode, mild: Secondary | ICD-10-CM | POA: Diagnosis not present

## 2019-08-29 DIAGNOSIS — R6889 Other general symptoms and signs: Secondary | ICD-10-CM | POA: Diagnosis not present

## 2019-08-29 DIAGNOSIS — M6281 Muscle weakness (generalized): Secondary | ICD-10-CM | POA: Diagnosis not present

## 2019-08-29 DIAGNOSIS — M48061 Spinal stenosis, lumbar region without neurogenic claudication: Secondary | ICD-10-CM | POA: Diagnosis not present

## 2019-08-30 DIAGNOSIS — E1121 Type 2 diabetes mellitus with diabetic nephropathy: Secondary | ICD-10-CM | POA: Diagnosis not present

## 2019-08-30 DIAGNOSIS — N390 Urinary tract infection, site not specified: Secondary | ICD-10-CM | POA: Diagnosis not present

## 2019-08-30 DIAGNOSIS — D649 Anemia, unspecified: Secondary | ICD-10-CM | POA: Diagnosis not present

## 2019-08-30 DIAGNOSIS — M48061 Spinal stenosis, lumbar region without neurogenic claudication: Secondary | ICD-10-CM | POA: Diagnosis not present

## 2019-08-30 DIAGNOSIS — M6281 Muscle weakness (generalized): Secondary | ICD-10-CM | POA: Diagnosis not present

## 2019-08-30 DIAGNOSIS — E1142 Type 2 diabetes mellitus with diabetic polyneuropathy: Secondary | ICD-10-CM | POA: Diagnosis not present

## 2019-09-04 DIAGNOSIS — N390 Urinary tract infection, site not specified: Secondary | ICD-10-CM | POA: Diagnosis not present

## 2019-09-04 DIAGNOSIS — D649 Anemia, unspecified: Secondary | ICD-10-CM | POA: Diagnosis not present

## 2019-09-04 DIAGNOSIS — E1121 Type 2 diabetes mellitus with diabetic nephropathy: Secondary | ICD-10-CM | POA: Diagnosis not present

## 2019-09-04 DIAGNOSIS — E1142 Type 2 diabetes mellitus with diabetic polyneuropathy: Secondary | ICD-10-CM | POA: Diagnosis not present

## 2019-09-04 DIAGNOSIS — M48061 Spinal stenosis, lumbar region without neurogenic claudication: Secondary | ICD-10-CM | POA: Diagnosis not present

## 2019-09-04 DIAGNOSIS — M6281 Muscle weakness (generalized): Secondary | ICD-10-CM | POA: Diagnosis not present

## 2019-09-06 DIAGNOSIS — M48061 Spinal stenosis, lumbar region without neurogenic claudication: Secondary | ICD-10-CM | POA: Diagnosis not present

## 2019-09-13 DIAGNOSIS — D649 Anemia, unspecified: Secondary | ICD-10-CM | POA: Diagnosis not present

## 2019-09-13 DIAGNOSIS — M6281 Muscle weakness (generalized): Secondary | ICD-10-CM | POA: Diagnosis not present

## 2019-09-13 DIAGNOSIS — N390 Urinary tract infection, site not specified: Secondary | ICD-10-CM | POA: Diagnosis not present

## 2019-09-13 DIAGNOSIS — M48061 Spinal stenosis, lumbar region without neurogenic claudication: Secondary | ICD-10-CM | POA: Diagnosis not present

## 2019-09-13 DIAGNOSIS — E1121 Type 2 diabetes mellitus with diabetic nephropathy: Secondary | ICD-10-CM | POA: Diagnosis not present

## 2019-09-13 DIAGNOSIS — E1142 Type 2 diabetes mellitus with diabetic polyneuropathy: Secondary | ICD-10-CM | POA: Diagnosis not present

## 2019-09-20 DIAGNOSIS — M6281 Muscle weakness (generalized): Secondary | ICD-10-CM | POA: Diagnosis not present

## 2019-09-20 DIAGNOSIS — E1142 Type 2 diabetes mellitus with diabetic polyneuropathy: Secondary | ICD-10-CM | POA: Diagnosis not present

## 2019-09-20 DIAGNOSIS — N390 Urinary tract infection, site not specified: Secondary | ICD-10-CM | POA: Diagnosis not present

## 2019-09-20 DIAGNOSIS — E1121 Type 2 diabetes mellitus with diabetic nephropathy: Secondary | ICD-10-CM | POA: Diagnosis not present

## 2019-09-20 DIAGNOSIS — D649 Anemia, unspecified: Secondary | ICD-10-CM | POA: Diagnosis not present

## 2019-09-20 DIAGNOSIS — M48061 Spinal stenosis, lumbar region without neurogenic claudication: Secondary | ICD-10-CM | POA: Diagnosis not present

## 2019-09-24 DIAGNOSIS — M6281 Muscle weakness (generalized): Secondary | ICD-10-CM | POA: Diagnosis not present

## 2019-09-24 DIAGNOSIS — I10 Essential (primary) hypertension: Secondary | ICD-10-CM | POA: Diagnosis not present

## 2019-09-24 DIAGNOSIS — F17220 Nicotine dependence, chewing tobacco, uncomplicated: Secondary | ICD-10-CM | POA: Diagnosis not present

## 2019-09-24 DIAGNOSIS — M48061 Spinal stenosis, lumbar region without neurogenic claudication: Secondary | ICD-10-CM | POA: Diagnosis not present

## 2019-09-24 DIAGNOSIS — D649 Anemia, unspecified: Secondary | ICD-10-CM | POA: Diagnosis not present

## 2019-09-24 DIAGNOSIS — Z7984 Long term (current) use of oral hypoglycemic drugs: Secondary | ICD-10-CM | POA: Diagnosis not present

## 2019-09-24 DIAGNOSIS — D259 Leiomyoma of uterus, unspecified: Secondary | ICD-10-CM | POA: Diagnosis not present

## 2019-09-24 DIAGNOSIS — I8393 Asymptomatic varicose veins of bilateral lower extremities: Secondary | ICD-10-CM | POA: Diagnosis not present

## 2019-09-24 DIAGNOSIS — E1142 Type 2 diabetes mellitus with diabetic polyneuropathy: Secondary | ICD-10-CM | POA: Diagnosis not present

## 2019-09-24 DIAGNOSIS — E1121 Type 2 diabetes mellitus with diabetic nephropathy: Secondary | ICD-10-CM | POA: Diagnosis not present

## 2019-09-24 DIAGNOSIS — N390 Urinary tract infection, site not specified: Secondary | ICD-10-CM | POA: Diagnosis not present

## 2019-09-25 DIAGNOSIS — N390 Urinary tract infection, site not specified: Secondary | ICD-10-CM | POA: Diagnosis not present

## 2019-09-25 DIAGNOSIS — E1142 Type 2 diabetes mellitus with diabetic polyneuropathy: Secondary | ICD-10-CM | POA: Diagnosis not present

## 2019-09-25 DIAGNOSIS — E1121 Type 2 diabetes mellitus with diabetic nephropathy: Secondary | ICD-10-CM | POA: Diagnosis not present

## 2019-09-25 DIAGNOSIS — M48061 Spinal stenosis, lumbar region without neurogenic claudication: Secondary | ICD-10-CM | POA: Diagnosis not present

## 2019-09-25 DIAGNOSIS — M6281 Muscle weakness (generalized): Secondary | ICD-10-CM | POA: Diagnosis not present

## 2019-09-25 DIAGNOSIS — D649 Anemia, unspecified: Secondary | ICD-10-CM | POA: Diagnosis not present

## 2019-09-28 DIAGNOSIS — E1142 Type 2 diabetes mellitus with diabetic polyneuropathy: Secondary | ICD-10-CM | POA: Diagnosis not present

## 2019-09-28 DIAGNOSIS — M6281 Muscle weakness (generalized): Secondary | ICD-10-CM | POA: Diagnosis not present

## 2019-09-28 DIAGNOSIS — E1121 Type 2 diabetes mellitus with diabetic nephropathy: Secondary | ICD-10-CM | POA: Diagnosis not present

## 2019-09-28 DIAGNOSIS — D649 Anemia, unspecified: Secondary | ICD-10-CM | POA: Diagnosis not present

## 2019-09-28 DIAGNOSIS — N390 Urinary tract infection, site not specified: Secondary | ICD-10-CM | POA: Diagnosis not present

## 2019-09-28 DIAGNOSIS — M48061 Spinal stenosis, lumbar region without neurogenic claudication: Secondary | ICD-10-CM | POA: Diagnosis not present

## 2019-10-02 DIAGNOSIS — D649 Anemia, unspecified: Secondary | ICD-10-CM | POA: Diagnosis not present

## 2019-10-02 DIAGNOSIS — M48061 Spinal stenosis, lumbar region without neurogenic claudication: Secondary | ICD-10-CM | POA: Diagnosis not present

## 2019-10-02 DIAGNOSIS — E1121 Type 2 diabetes mellitus with diabetic nephropathy: Secondary | ICD-10-CM | POA: Diagnosis not present

## 2019-10-02 DIAGNOSIS — E1142 Type 2 diabetes mellitus with diabetic polyneuropathy: Secondary | ICD-10-CM | POA: Diagnosis not present

## 2019-10-02 DIAGNOSIS — N390 Urinary tract infection, site not specified: Secondary | ICD-10-CM | POA: Diagnosis not present

## 2019-10-02 DIAGNOSIS — M6281 Muscle weakness (generalized): Secondary | ICD-10-CM | POA: Diagnosis not present

## 2019-10-04 DIAGNOSIS — N390 Urinary tract infection, site not specified: Secondary | ICD-10-CM | POA: Diagnosis not present

## 2019-10-04 DIAGNOSIS — M6281 Muscle weakness (generalized): Secondary | ICD-10-CM | POA: Diagnosis not present

## 2019-10-04 DIAGNOSIS — D649 Anemia, unspecified: Secondary | ICD-10-CM | POA: Diagnosis not present

## 2019-10-04 DIAGNOSIS — M48061 Spinal stenosis, lumbar region without neurogenic claudication: Secondary | ICD-10-CM | POA: Diagnosis not present

## 2019-10-04 DIAGNOSIS — E1142 Type 2 diabetes mellitus with diabetic polyneuropathy: Secondary | ICD-10-CM | POA: Diagnosis not present

## 2019-10-04 DIAGNOSIS — E1121 Type 2 diabetes mellitus with diabetic nephropathy: Secondary | ICD-10-CM | POA: Diagnosis not present

## 2019-10-09 DIAGNOSIS — E1121 Type 2 diabetes mellitus with diabetic nephropathy: Secondary | ICD-10-CM | POA: Diagnosis not present

## 2019-10-09 DIAGNOSIS — D649 Anemia, unspecified: Secondary | ICD-10-CM | POA: Diagnosis not present

## 2019-10-09 DIAGNOSIS — M48061 Spinal stenosis, lumbar region without neurogenic claudication: Secondary | ICD-10-CM | POA: Diagnosis not present

## 2019-10-09 DIAGNOSIS — E1142 Type 2 diabetes mellitus with diabetic polyneuropathy: Secondary | ICD-10-CM | POA: Diagnosis not present

## 2019-10-09 DIAGNOSIS — N390 Urinary tract infection, site not specified: Secondary | ICD-10-CM | POA: Diagnosis not present

## 2019-10-09 DIAGNOSIS — M6281 Muscle weakness (generalized): Secondary | ICD-10-CM | POA: Diagnosis not present

## 2019-10-11 DIAGNOSIS — N390 Urinary tract infection, site not specified: Secondary | ICD-10-CM | POA: Diagnosis not present

## 2019-10-11 DIAGNOSIS — E1142 Type 2 diabetes mellitus with diabetic polyneuropathy: Secondary | ICD-10-CM | POA: Diagnosis not present

## 2019-10-11 DIAGNOSIS — E1121 Type 2 diabetes mellitus with diabetic nephropathy: Secondary | ICD-10-CM | POA: Diagnosis not present

## 2019-10-11 DIAGNOSIS — D649 Anemia, unspecified: Secondary | ICD-10-CM | POA: Diagnosis not present

## 2019-10-11 DIAGNOSIS — M48061 Spinal stenosis, lumbar region without neurogenic claudication: Secondary | ICD-10-CM | POA: Diagnosis not present

## 2019-10-11 DIAGNOSIS — M6281 Muscle weakness (generalized): Secondary | ICD-10-CM | POA: Diagnosis not present

## 2019-10-16 ENCOUNTER — Inpatient Hospital Stay (HOSPITAL_COMMUNITY)
Admission: EM | Admit: 2019-10-16 | Discharge: 2019-10-19 | DRG: 072 | Disposition: A | Discharge status: Skilled Nursing Facility | Payer: Medicare Other | Attending: Internal Medicine | Admitting: Internal Medicine

## 2019-10-16 ENCOUNTER — Emergency Department (HOSPITAL_COMMUNITY): Payer: Medicare Other

## 2019-10-16 ENCOUNTER — Encounter (HOSPITAL_COMMUNITY): Payer: Self-pay | Admitting: Internal Medicine

## 2019-10-16 ENCOUNTER — Ambulatory Visit (INDEPENDENT_AMBULATORY_CARE_PROVIDER_SITE_OTHER): Payer: Medicare Other | Admitting: *Deleted

## 2019-10-16 ENCOUNTER — Other Ambulatory Visit: Payer: Self-pay

## 2019-10-16 DIAGNOSIS — R1310 Dysphagia, unspecified: Secondary | ICD-10-CM | POA: Diagnosis present

## 2019-10-16 DIAGNOSIS — Z8719 Personal history of other diseases of the digestive system: Secondary | ICD-10-CM | POA: Diagnosis not present

## 2019-10-16 DIAGNOSIS — R7989 Other specified abnormal findings of blood chemistry: Secondary | ICD-10-CM

## 2019-10-16 DIAGNOSIS — Z79899 Other long term (current) drug therapy: Secondary | ICD-10-CM

## 2019-10-16 DIAGNOSIS — R41841 Cognitive communication deficit: Secondary | ICD-10-CM | POA: Diagnosis not present

## 2019-10-16 DIAGNOSIS — Z96653 Presence of artificial knee joint, bilateral: Secondary | ICD-10-CM | POA: Diagnosis present

## 2019-10-16 DIAGNOSIS — Z7401 Bed confinement status: Secondary | ICD-10-CM | POA: Diagnosis not present

## 2019-10-16 DIAGNOSIS — E860 Dehydration: Secondary | ICD-10-CM | POA: Diagnosis present

## 2019-10-16 DIAGNOSIS — H3530 Unspecified macular degeneration: Secondary | ICD-10-CM | POA: Diagnosis present

## 2019-10-16 DIAGNOSIS — Z79891 Long term (current) use of opiate analgesic: Secondary | ICD-10-CM | POA: Diagnosis not present

## 2019-10-16 DIAGNOSIS — E861 Hypovolemia: Secondary | ICD-10-CM | POA: Diagnosis present

## 2019-10-16 DIAGNOSIS — Z03818 Encounter for observation for suspected exposure to other biological agents ruled out: Secondary | ICD-10-CM | POA: Diagnosis not present

## 2019-10-16 DIAGNOSIS — R0902 Hypoxemia: Secondary | ICD-10-CM | POA: Diagnosis not present

## 2019-10-16 DIAGNOSIS — M2550 Pain in unspecified joint: Secondary | ICD-10-CM | POA: Diagnosis not present

## 2019-10-16 DIAGNOSIS — R2689 Other abnormalities of gait and mobility: Secondary | ICD-10-CM | POA: Diagnosis not present

## 2019-10-16 DIAGNOSIS — Z20822 Contact with and (suspected) exposure to covid-19: Secondary | ICD-10-CM | POA: Diagnosis present

## 2019-10-16 DIAGNOSIS — R1312 Dysphagia, oropharyngeal phase: Secondary | ICD-10-CM | POA: Diagnosis not present

## 2019-10-16 DIAGNOSIS — Z8673 Personal history of transient ischemic attack (TIA), and cerebral infarction without residual deficits: Secondary | ICD-10-CM

## 2019-10-16 DIAGNOSIS — Z7984 Long term (current) use of oral hypoglycemic drugs: Secondary | ICD-10-CM | POA: Diagnosis not present

## 2019-10-16 DIAGNOSIS — E440 Moderate protein-calorie malnutrition: Secondary | ICD-10-CM | POA: Diagnosis not present

## 2019-10-16 DIAGNOSIS — I10 Essential (primary) hypertension: Secondary | ICD-10-CM | POA: Diagnosis present

## 2019-10-16 DIAGNOSIS — I442 Atrioventricular block, complete: Secondary | ICD-10-CM | POA: Diagnosis not present

## 2019-10-16 DIAGNOSIS — R410 Disorientation, unspecified: Secondary | ICD-10-CM | POA: Diagnosis not present

## 2019-10-16 DIAGNOSIS — R278 Other lack of coordination: Secondary | ICD-10-CM | POA: Diagnosis not present

## 2019-10-16 DIAGNOSIS — Z9181 History of falling: Secondary | ICD-10-CM | POA: Diagnosis not present

## 2019-10-16 DIAGNOSIS — F17290 Nicotine dependence, other tobacco product, uncomplicated: Secondary | ICD-10-CM | POA: Diagnosis present

## 2019-10-16 DIAGNOSIS — G9340 Encephalopathy, unspecified: Secondary | ICD-10-CM | POA: Diagnosis not present

## 2019-10-16 DIAGNOSIS — Z950 Presence of cardiac pacemaker: Secondary | ICD-10-CM

## 2019-10-16 DIAGNOSIS — I272 Pulmonary hypertension, unspecified: Secondary | ICD-10-CM | POA: Diagnosis present

## 2019-10-16 DIAGNOSIS — R4182 Altered mental status, unspecified: Secondary | ICD-10-CM | POA: Diagnosis not present

## 2019-10-16 DIAGNOSIS — E876 Hypokalemia: Secondary | ICD-10-CM | POA: Diagnosis present

## 2019-10-16 DIAGNOSIS — R2681 Unsteadiness on feet: Secondary | ICD-10-CM | POA: Diagnosis not present

## 2019-10-16 DIAGNOSIS — E119 Type 2 diabetes mellitus without complications: Secondary | ICD-10-CM | POA: Diagnosis present

## 2019-10-16 DIAGNOSIS — R442 Other hallucinations: Secondary | ICD-10-CM | POA: Diagnosis not present

## 2019-10-16 DIAGNOSIS — R29810 Facial weakness: Secondary | ICD-10-CM | POA: Diagnosis not present

## 2019-10-16 DIAGNOSIS — Z9049 Acquired absence of other specified parts of digestive tract: Secondary | ICD-10-CM | POA: Diagnosis not present

## 2019-10-16 DIAGNOSIS — I469 Cardiac arrest, cause unspecified: Secondary | ICD-10-CM | POA: Diagnosis not present

## 2019-10-16 DIAGNOSIS — I447 Left bundle-branch block, unspecified: Secondary | ICD-10-CM | POA: Diagnosis present

## 2019-10-16 DIAGNOSIS — G9341 Metabolic encephalopathy: Principal | ICD-10-CM | POA: Diagnosis present

## 2019-10-16 DIAGNOSIS — M6281 Muscle weakness (generalized): Secondary | ICD-10-CM | POA: Diagnosis not present

## 2019-10-16 DIAGNOSIS — R531 Weakness: Secondary | ICD-10-CM

## 2019-10-16 DIAGNOSIS — R350 Frequency of micturition: Secondary | ICD-10-CM | POA: Diagnosis not present

## 2019-10-16 DIAGNOSIS — R404 Transient alteration of awareness: Secondary | ICD-10-CM | POA: Diagnosis not present

## 2019-10-16 DIAGNOSIS — M6259 Muscle wasting and atrophy, not elsewhere classified, multiple sites: Secondary | ICD-10-CM | POA: Diagnosis not present

## 2019-10-16 LAB — AMMONIA: Ammonia: 20 umol/L (ref 9–35)

## 2019-10-16 LAB — CUP PACEART REMOTE DEVICE CHECK
Battery Remaining Longevity: 133 mo
Battery Voltage: 3.11 V
Brady Statistic AP VP Percent: 10 %
Brady Statistic AP VS Percent: 0 %
Brady Statistic AS VP Percent: 88.45 %
Brady Statistic AS VS Percent: 1.55 %
Brady Statistic RA Percent Paced: 10.46 %
Brady Statistic RV Percent Paced: 98.45 %
Date Time Interrogation Session: 20210202030454
Implantable Lead Implant Date: 20030311
Implantable Lead Implant Date: 20030311
Implantable Lead Location: 753859
Implantable Lead Location: 753860
Implantable Lead Model: 4092
Implantable Lead Model: 4592
Implantable Pulse Generator Implant Date: 20200803
Lead Channel Impedance Value: 418 Ohm
Lead Channel Impedance Value: 494 Ohm
Lead Channel Impedance Value: 513 Ohm
Lead Channel Impedance Value: 570 Ohm
Lead Channel Pacing Threshold Amplitude: 0.375 V
Lead Channel Pacing Threshold Amplitude: 0.625 V
Lead Channel Pacing Threshold Pulse Width: 0.4 ms
Lead Channel Pacing Threshold Pulse Width: 0.4 ms
Lead Channel Sensing Intrinsic Amplitude: 11.375 mV
Lead Channel Sensing Intrinsic Amplitude: 11.375 mV
Lead Channel Sensing Intrinsic Amplitude: 2.125 mV
Lead Channel Sensing Intrinsic Amplitude: 2.125 mV
Lead Channel Setting Pacing Amplitude: 1.5 V
Lead Channel Setting Pacing Amplitude: 2.5 V
Lead Channel Setting Pacing Pulse Width: 0.4 ms
Lead Channel Setting Sensing Sensitivity: 4 mV

## 2019-10-16 LAB — CBC WITH DIFFERENTIAL/PLATELET
Abs Immature Granulocytes: 0.01 10*3/uL (ref 0.00–0.07)
Basophils Absolute: 0 10*3/uL (ref 0.0–0.1)
Basophils Relative: 0 %
Eosinophils Absolute: 0 10*3/uL (ref 0.0–0.5)
Eosinophils Relative: 0 %
HCT: 46.9 % — ABNORMAL HIGH (ref 36.0–46.0)
Hemoglobin: 15.3 g/dL — ABNORMAL HIGH (ref 12.0–15.0)
Immature Granulocytes: 0 %
Lymphocytes Relative: 35 %
Lymphs Abs: 2.5 10*3/uL (ref 0.7–4.0)
MCH: 28.7 pg (ref 26.0–34.0)
MCHC: 32.6 g/dL (ref 30.0–36.0)
MCV: 87.8 fL (ref 80.0–100.0)
Monocytes Absolute: 0.5 10*3/uL (ref 0.1–1.0)
Monocytes Relative: 8 %
Neutro Abs: 4 10*3/uL (ref 1.7–7.7)
Neutrophils Relative %: 57 %
Platelets: 206 10*3/uL (ref 150–400)
RBC: 5.34 MIL/uL — ABNORMAL HIGH (ref 3.87–5.11)
RDW: 13.7 % (ref 11.5–15.5)
WBC: 7.2 10*3/uL (ref 4.0–10.5)
nRBC: 0 % (ref 0.0–0.2)

## 2019-10-16 LAB — URINALYSIS, ROUTINE W REFLEX MICROSCOPIC
Bacteria, UA: NONE SEEN
Bilirubin Urine: NEGATIVE
Glucose, UA: NEGATIVE mg/dL
Hgb urine dipstick: NEGATIVE
Ketones, ur: 5 mg/dL — AB
Leukocytes,Ua: NEGATIVE
Nitrite: NEGATIVE
Protein, ur: >=300 mg/dL — AB
Specific Gravity, Urine: 1.019 (ref 1.005–1.030)
pH: 6 (ref 5.0–8.0)

## 2019-10-16 LAB — BLOOD GAS, ARTERIAL
Acid-Base Excess: 2.7 mmol/L — ABNORMAL HIGH (ref 0.0–2.0)
Bicarbonate: 25.7 mmol/L (ref 20.0–28.0)
O2 Saturation: 96.6 %
Patient temperature: 98.6
pCO2 arterial: 35.6 mmHg (ref 32.0–48.0)
pH, Arterial: 7.471 — ABNORMAL HIGH (ref 7.350–7.450)
pO2, Arterial: 83.4 mmHg (ref 83.0–108.0)

## 2019-10-16 LAB — COMPREHENSIVE METABOLIC PANEL
ALT: 19 U/L (ref 0–44)
AST: 19 U/L (ref 15–41)
Albumin: 4.3 g/dL (ref 3.5–5.0)
Alkaline Phosphatase: 44 U/L (ref 38–126)
Anion gap: 14 (ref 5–15)
BUN: 12 mg/dL (ref 8–23)
CO2: 24 mmol/L (ref 22–32)
Calcium: 9.5 mg/dL (ref 8.9–10.3)
Chloride: 103 mmol/L (ref 98–111)
Creatinine, Ser: 0.59 mg/dL (ref 0.44–1.00)
GFR calc Af Amer: >60 mL/min (ref >60)
GFR calc non Af Amer: >60 mL/min (ref >60)
Glucose, Bld: 173 mg/dL — ABNORMAL HIGH (ref 70–99)
Potassium: 3.3 mmol/L — ABNORMAL LOW (ref 3.5–5.1)
Sodium: 141 mmol/L (ref 135–145)
Total Bilirubin: 1.6 mg/dL — ABNORMAL HIGH (ref 0.3–1.2)
Total Protein: 7.9 g/dL (ref 6.5–8.1)

## 2019-10-16 LAB — RESPIRATORY PANEL BY RT PCR (FLU A&B, COVID)
Influenza A by PCR: NEGATIVE
Influenza B by PCR: NEGATIVE
SARS Coronavirus 2 by RT PCR: NEGATIVE

## 2019-10-16 LAB — ACETAMINOPHEN LEVEL: Acetaminophen (Tylenol), Serum: <10 ug/mL — ABNORMAL LOW (ref 10–30)

## 2019-10-16 LAB — LACTIC ACID, PLASMA
Lactic Acid, Venous: 2.3 mmol/L (ref 0.5–1.9)
Lactic Acid, Venous: 1.9 mmol/L (ref 0.5–1.9)

## 2019-10-16 LAB — SALICYLATE LEVEL: Salicylate Lvl: <7 mg/dL — ABNORMAL LOW (ref 7.0–30.0)

## 2019-10-16 MED ORDER — LABETALOL HCL 5 MG/ML IV SOLN
10.0000 mg | INTRAVENOUS | Status: DC | PRN
  Administered 2019-10-16 – 2019-10-18 (×4): 10 mg via INTRAVENOUS
  Filled 2019-10-16 (×4): qty 4

## 2019-10-16 MED ORDER — INSULIN ASPART 100 UNIT/ML ~~LOC~~ SOLN
0.0000 [IU] | Freq: Four times a day (QID) | SUBCUTANEOUS | Status: DC
  Administered 2019-10-17 (×2): 1 [IU] via SUBCUTANEOUS
  Administered 2019-10-17 – 2019-10-18 (×2): 2 [IU] via SUBCUTANEOUS
  Administered 2019-10-18: 1 [IU] via SUBCUTANEOUS

## 2019-10-16 MED ORDER — ENOXAPARIN SODIUM 40 MG/0.4ML ~~LOC~~ SOLN
40.0000 mg | SUBCUTANEOUS | Status: DC
  Administered 2019-10-17 – 2019-10-19 (×3): 40 mg via SUBCUTANEOUS
  Filled 2019-10-16 (×3): qty 0.4

## 2019-10-16 MED ORDER — POTASSIUM CHLORIDE 2 MEQ/ML IV SOLN
INTRAVENOUS | Status: AC
  Filled 2019-10-16 (×3): qty 1000

## 2019-10-16 MED ORDER — SODIUM CHLORIDE 0.9% FLUSH
3.0000 mL | Freq: Two times a day (BID) | INTRAVENOUS | Status: DC
  Administered 2019-10-16 – 2019-10-19 (×4): 3 mL via INTRAVENOUS

## 2019-10-16 MED ORDER — SODIUM CHLORIDE 0.9 % IV SOLN: INTRAVENOUS | Status: DC

## 2019-10-16 NOTE — ED Provider Notes (Signed)
[facility] DEPT Provider Note   CSN: [id number] Arrival date & time: 10/16/19  1243     History Chief Complaint  Patient presents with  . Possible UTI    Maria [name] is a 86 y.o. female.  Patient is a 86 year old female with a history of pulmonary hypertension, diabetes, complete heart block status post pacemaker who is presenting today due to altered mental status.  Patient's son gives the history.  Approximately 10 days ago patient who was living at home independently and her children check on her multiple times a day had a fall.  Her daughter found her laying on the floor when she came in that morning last Sunday 10 days ago.  Son reports that ever since that day patient has been gradually declining.  She is started having significant hallucinations seeing things and talking to things that are not there.  She has been coming increasingly more weak to where now she is unable to even feed herself or assist him in getting dressed.  He states now he is dragging her to the bathroom because she is too weak to make it there on her own.  She did not sleep at all last night and he reports that she was very restless in bed.  This morning she was so tired she would not even wake up and they were concerned about what may be going on.  She has not had any vomiting, diarrhea, cough, shortness of breath.  She has been urinating frequently but appetite and intake has decreased.  No recent medication changes.  She does not take any narcotic medications but does take extra strength Tylenol.  The history is provided by a caregiver and the EMS personnel.       Past Medical History:  Diagnosis Date  . Anxiety   . Arthritis   . Cancer ([place])    breast 2015  . Cardiac pacemaker in situ   . Chest pain   . Diabetes mellitus without complication ([place])   . DM type 2 (diabetes mellitus, type 2) ([place])   . Dyslipidemia   . Dyspnea    with exertion  . Family history of adverse  reaction to anesthesia    daughter slow to wake up  . Gallstones   . Headache   . Hypertension   . Macular degeneration   . Pulmonary hypertension ([facility])    PA peak pressure 81 mmHg by 09/2016 echo  . Type II or unspecified type diabetes mellitus without mention of complication, not stated as uncontrolled   . Wears partial dentures     Patient Active Problem List   Diagnosis Date Noted  . Heart block AV complete ([place]) 04/13/2019  . Acute acalculous cholecystitis 10/07/2016  . Elevated troponin 10/07/2016  . Lactic acidosis 10/07/2016  . Sinus tachycardia 10/07/2016  . Dyslipidemia 06/05/2013  . Cardiac pacemaker 05/30/2013  . DM type 2 (diabetes mellitus, type 2) ([place]) 05/21/2013  . Chest pain 05/20/2013  . HTN (hypertension) 05/20/2013    Past Surgical History:  Procedure Laterality Date  . BREAST EXCISIONAL BIOPSY Right 2010  . CATARACT EXTRACTION    . CHOLECYSTECTOMY N/A 01/18/2017   Procedure: LAPAROSCOPIC CHOLECYSTECTOMY;  Surgeon: Ralene Ok, MD;  Location: [facility];  Service: General;  Laterality: N/A;  . IR GENERIC HISTORICAL  10/08/2016   IR PERC CHOLECYSTOSTOMY 10/08/2016 Greggory Keen, MD [facility]  . IR RADIOLOGIST EVAL & MGMT  11/16/2016  . KNEE SURGERY Bilateral   . MULTIPLE TOOTH  EXTRACTIONS    . PACEMAKER INSERTION     [employer], [facility] 2003  . PACEMAKER INSERTION    . PPM GENERATOR CHANGEOUT N/A 04/16/2019   Procedure: PPM GENERATOR CHANGEOUT;  Surgeon: Evans Lance, MD;  Location: [facility];  Service: Cardiovascular;  Laterality: N/A;  . TOTAL KNEE ARTHROPLASTY Bilateral      OB History    Gravida  0   Para  0   Term  0   Preterm  0   AB  0   Living        SAB  0   TAB  0   Ectopic  0   Multiple      Live Births              Family History  Problem Relation Age of Onset  . Colon cancer Neg Hx     Social History   Tobacco Use  . Smoking status: Never Smoker  . Smokeless tobacco: Current User    Types:  Snuff  Substance Use Topics  . Alcohol use: No    Alcohol/week: 1.0 standard drinks    Types: 1 Cans of beer per week  . Drug use: No    Home Medications Prior to Admission medications   Medication Sig Start Date End Date Taking? Authorizing Provider  acetaminophen (TYLENOL) 500 MG tablet Take 1,000 mg by mouth every 6 (six) hours as needed for mild pain.     [provider]  Blood Glucose Monitoring Suppl (FREESTYLE LITE) DEVI  08/28/14   [provider]  cephALEXin (KEFLEX) 500 MG capsule Take 2 capsules (1,000 mg total) by mouth 2 (two) times daily. 07/18/19   Charlesetta Shanks, MD  FREESTYLE LITE test strip  08/28/14   [provider]  glimepiride (AMARYL) 1 MG tablet Take 1 mg by mouth daily. 03/08/19   [provider]  hydrochlorothiazide (HYDRODIURIL) 12.5 MG tablet Take 12.5 mg by mouth daily. 12/04/18   [provider]  HYDROcodone-acetaminophen (NORCO/VICODIN) 5-325 MG tablet Take 1 tablet by mouth every 6 (six) hours as needed for moderate pain or severe pain. 07/18/19   Charlesetta Shanks, MD  Lancets (FREESTYLE) lancets  08/28/14   [provider]  losartan (COZAAR) 50 MG tablet Take 50 mg by mouth daily. 12/04/18   [provider]  meclizine (ANTIVERT) 25 MG tablet Take 25 mg by mouth 3 (three) times daily as needed for dizziness.    [provider]  Menthol-Methyl Salicylate (SALONPAS PAIN RELIEF PATCH EX) Place 1 patch onto the skin daily as needed (back pain.).    [provider]  metFORMIN (GLUCOPHAGE) 1000 MG tablet Take 1,000 mg by mouth daily with breakfast.  09/09/14   [provider]  Multiple Vitamins-Minerals (PRESERVISION AREDS 2 PO) Take 1 tablet by mouth daily.    [provider]  omeprazole (PRILOSEC) 20 MG capsule Take 20 mg by mouth daily before breakfast.  10/10/18   [provider]  ondansetron (ZOFRAN ODT) 4 MG disintegrating tablet Take 1 tablet (4 mg total) by  mouth every 8 (eight) hours as needed for nausea or vomiting. 12/19/18   Darlin Drop P, PA-C  Polyvinyl Alcohol-Povidone (REFRESH OP) Apply 1 drop to eye daily.     [provider]  RESTASIS 0.05 % ophthalmic emulsion Place 1 drop into both eyes daily. 03/26/19   [provider]  Trolamine Salicylate (ASPERCREME EX) Apply 1 application topically daily as needed (muscle aches).  [provider]  valACYclovir (VALTREX) 1000 MG tablet Take 1 g by mouth daily as needed (cold sore/fever blisters).  11/23/18   [provider]    Allergies    Patient has no known allergies.  Review of Systems   Review of Systems  All other systems reviewed and are negative.   Physical Exam Updated Vital Signs BP (!) 181/100 (BP Location: Left Arm)   Pulse 79   Temp (!) 97.5 F (36.4 C) (Oral)   Resp 17   SpO2 98%   Physical Exam Vitals and nursing note reviewed.  Constitutional:      General: She is sleeping. She is not in acute distress.    Appearance: She is well-developed.     Comments: Patient will open her eyes for a brief moment when you say her name and goes right back to sleep  HENT:     Head: Normocephalic and atraumatic.     Mouth/Throat:     Mouth: Mucous membranes are dry.  Eyes:     Conjunctiva/sclera: Conjunctivae normal.     Pupils: Pupils are equal, round, and reactive to light.  Cardiovascular:     Rate and Rhythm: Normal rate and regular rhythm.     Pulses: Normal pulses.     Heart sounds: No murmur.  Pulmonary:     Effort: Pulmonary effort is normal. No respiratory distress.     Breath sounds: Normal breath sounds. No wheezing or rales.     Comments: Pacemaker present in the left upper chest wall Abdominal:     General: There is no distension.     Palpations: Abdomen is soft.     Tenderness: There is no abdominal tenderness. There is no guarding or rebound.  Musculoskeletal:        General: No tenderness. Normal range of motion.      Cervical back: Normal range of motion and neck supple.     Right lower leg: No edema.     Left lower leg: No edema.  Skin:    General: Skin is warm and dry.     Findings: No erythema or rash.  Neurological:     Mental Status: She is lethargic.     Comments: Patient is noted to move all extremities but she will not wake up or answer questions     ED Results / Procedures / Treatments   Labs (all labs ordered are listed, but only abnormal results are displayed) Labs Reviewed  CBC WITH DIFFERENTIAL/PLATELET - Abnormal; Notable for the following components:      Result Value   RBC 5.34 (*)    Hemoglobin 15.3 (*)    HCT 46.9 (*)    All other components within normal limits  COMPREHENSIVE METABOLIC PANEL - Abnormal; Notable for the following components:   Potassium 3.3 (*)    Glucose, Bld 173 (*)    Total Bilirubin 1.6 (*)    All other components within normal limits  ACETAMINOPHEN LEVEL - Abnormal; Notable for the following components:   Acetaminophen (Tylenol), Serum <10 (*)    All other components within normal limits  SALICYLATE LEVEL - Abnormal; Notable for the following components:   Salicylate Lvl [id number] (*)    All other components within normal limits  LACTIC ACID, PLASMA - Abnormal; Notable for the following components:   Lactic Acid, Venous 2.3 (*)    All other components within normal limits  URINE CULTURE  AMMONIA  URINALYSIS, ROUTINE W REFLEX MICROSCOPIC    EKG  EKG Interpretation  Date/Time:  Tuesday October 16 2019 13:10:23 EST Ventricular Rate:  76 PR Interval:    QRS Duration: 133 QT Interval:  451 QTC Calculation: 508 R Axis:   -70 Text Interpretation: Sinus rhythm Left bundle branch block No significant change since last tracing Confirmed by Blanchie Dessert [id number]) on 10/16/2019 1:58:00 PM   Radiology DG Chest Port 1 View  Result Date: 10/16/2019 CLINICAL DATA:  Altered level of consciousness, frequent urination, confusion EXAM: PORTABLE CHEST 1 VIEW  COMPARISON:  07/19/2019 FINDINGS: Single frontal view of the chest demonstrates stable pacemaker. Cardiac silhouette is enlarged but stable. Chronic elevation the right hemidiaphragm. No airspace disease, effusion, or pneumothorax. Chronic degenerative changes of the bilateral shoulders. IMPRESSION: 1. Stable exam, no acute process. Electronically Signed   By: Randa Ngo M.D.   On: 10/16/2019 14:08   CUP PACEART REMOTE DEVICE CHECK  Result Date: 10/16/2019 Scheduled remote reviewed. Normal device function.  1 PAF, 3 min. 3 NSVT episodes, longest 9 beats Next remote 91 days- JBox, RN/CVRS   Procedures Procedures (including critical care time)  Medications Ordered in ED Medications - No data to display  ED Course  I have reviewed the triage vital signs and the nursing notes.  Pertinent labs & imaging results that were available during my care of the patient were reviewed by me and considered in my medical decision making (see chart for details).    MDM Rules/Calculators/A&P                      Elderly female with failure to thrive at home after a fall now with hallucinations, worsening weakness and decreased intake.  Family reports that she is now not sleeping at night and seeing and talking to things that are not there.  She has had no recent medication changes.  This all occurred after a fall that was unwitnessed.  She has not seem to have any new pain in any localized area.  CBC with mild hemoconcentration but otherwise normal, CMP with mild hypokalemia of 3.3 but stable creatinine.  Ammonia level is within normal limits.  Lactate is mildly elevated at 2.3.  Chest x-ray without acute findings and EKG without significant change.  Currently waiting on a urine and head CT.  Patient given IV fluids.  She takes no medications that could be causing her altered mental status.  Based on son's report sounds like patient is having delirium.  May be from concussion after her fall versus infection.   Patient is afebrile here.  Oxygen saturation is within normal limits and doubt Covid at this time.  Final Clinical Impression(s) / ED Diagnoses Final diagnoses:  None    Rx / DC Orders ED Discharge Orders    None       Blanchie Dessert, MD 10/16/19 2116

## 2019-10-16 NOTE — ED Notes (Signed)
Dr. Velia Meyer made aware of pt's lactic acid of 2.3.  MD to place orders for fluids and repeat lactic acid.

## 2019-10-16 NOTE — ED Triage Notes (Signed)
Pt BIBA from home. Pt has been altered x 1 week per family. Pt has had frequent urination and malodorous urine. Pt is more confused than normal, possibly hallucinating. Hx of UTIs.   Pt has not had morning meds

## 2019-10-16 NOTE — ED Notes (Signed)
Pt's daughter, [name], called and updated about pt's condition and being admitted.

## 2019-10-16 NOTE — H&P (Signed)
History and Physical    PLEASE NOTE THAT DRAGON DICTATION SOFTWARE WAS USED IN THE CONSTRUCTION OF THIS NOTE.   Maria [name] [id number] DOB: September 14, 1923 DOA: 10/16/2019  PCP: Audley Hose, MD Patient coming from: home   I have personally briefly reviewed patient's old medical records in [place]  Chief Complaint: Altered mental status  HPI: Maria [name] is a 86 y.o. female with medical history significant for type 2 diabetes mellitus, complete heart block status post pacemaker, hypertension, who is admitted to [facility] on 10/16/2019 with acute encephalopathy after presenting from home to [facility] Emergency Department for evaluation of altered mental status.   In the context of patient's acute encephalopathy, the following history is provided by the patient's son as well as via my discussions with the emergency department physician and via chart review.   The patient reportedly lives at home by herself, although her son lives close by and is able to check on her several times per week.  At baseline, the patient is mostly independent in completion of her ADLs, including ability to shower and dress herself.  Approximately 10 days ago, the patient son reports that the patient conveyed to him that she had experienced a ground-level mechanical fall that resulted from her tripping while ambulating within her home.  She conveyed to her son that she did not hit her head as a component of this fall and that there was no associated loss of consciousness, although this was an unwitnessed fall.  The patient denied any preceding chest pain, shortness of breath, dizziness, vertigo, or presyncope.  However, over the course of the ensuing 10 days following this reported ground-level mechanical fall, the patient son reports that the patient has exhibited evidence of progressive confusion and is also shown evidence of new onset visual hallucinations.  Additionally, the  patient's son reports that patient has exhibited increased somnolence relative to baseline, noting that the patient has required additional sleep over the last several days, and has been more difficult to arouse over that time.  Son is also noted the patient to exhibit generalized weakness over the last several days, requiring some assistance with transfers relative to her baseline ambulatory status at which she is able to ambulate independently.  Son has not witnessed any acute focal weakness, but rather knowledges a generalized weakness.  He also reports that the patient has exhibited diminished oral intake over the last several days.  The patient denies any acute focal discomfort following recent ground-level mechanical fall.  Denies any subjective fever, chills, rigors, or generalized myalgias. Denies any recent headache, neck stiffness, rhinitis, rhinorrhea, sore throat, sob, cough, nausea, vomiting, abdominal pain, diarrhea, or rash. No recent traveling or known COVID-19 exposures. Denies dysuria, gross hematuria, or change in urinary urgency/frequency.   ED Course:  Vital signs in the ED were notable for the following: Temperature max 98.4; heart rate 74-87; blood pressure ranged from 167/90 3-1 80/96; respiratory rate 17-21, and oxygen saturation 95 to 98% on room air.  Labs were notable for the following: ABG to assess for any element of hypercapnic encephalopathy demonstrated the following on room air: 7.47/35.6/83.4/25.7.  CMP notable for the following: Sodium 141, potassium 3.3, bicarbonate 24, BUN 12, creatinine 0.59, BUN/creatinine ratio 20.3, glucose 173, and liver enzymes were found to be within normal limits.  Serum ammonia at 20.  CBC notable for white blood cell count of 7200.  Initial lactic acid 2.3.  Urinalysis was notable for no  white blood cells, nitrate negative, leukocyte esterase negative, and showed 5 ketones.  Nasopharyngeal COVID-19 PCR was performed in the ED this evening, was  found to be negative.  In the setting of recent fall and ensuing acute encephalopathy, noncontrast CT of the head performed today showed no evidence of acute intracranial process, including no evidence of intracranial hemorrhage.  Chest x-ray, per final radiology report, showed chronic right diaphragm, stable pacemaker, and no evidence of acute cardiopulmonary process.  While in the ED, the patient received no IV fluids or medications.  Subsequently, the patient was admitted to a med telemetry bed at [facility] for further evaluation and management of acute encephalopathy complicated by hypokalemia, dehydration, generalized weakness.    Review of Systems: As per HPI otherwise 10 point review of systems negative.   Past Medical History:  Diagnosis Date  . Anxiety   . Arthritis   . Cancer ([place])    breast 2015  . Cardiac pacemaker in situ   . Chest pain   . Diabetes mellitus without complication ([place])   . DM type 2 (diabetes mellitus, type 2) ([place])   . Dyslipidemia   . Dyspnea    with exertion  . Family history of adverse reaction to anesthesia    daughter slow to wake up  . Gallstones   . Headache   . Hypertension   . Macular degeneration   . Pulmonary hypertension ([facility])    PA peak pressure 81 mmHg by 09/2016 echo  . Type II or unspecified type diabetes mellitus without mention of complication, not stated as uncontrolled   . Wears partial dentures     Past Surgical History:  Procedure Laterality Date  . BREAST EXCISIONAL BIOPSY Right 2010  . CATARACT EXTRACTION    . CHOLECYSTECTOMY N/A 01/18/2017   Procedure: LAPAROSCOPIC CHOLECYSTECTOMY;  Surgeon: Ralene Ok, MD;  Location: [place];  Service: General;  Laterality: N/A;  . IR GENERIC HISTORICAL  10/08/2016   IR PERC CHOLECYSTOSTOMY 10/08/2016 Greggory Keen, MD [facility]-INTERV RAD  . IR RADIOLOGIST EVAL & MGMT  11/16/2016  . KNEE SURGERY Bilateral   . MULTIPLE TOOTH EXTRACTIONS    . PACEMAKER INSERTION     [employer],  [facility] 2003  . PACEMAKER INSERTION    . PPM GENERATOR CHANGEOUT N/A 04/16/2019   Procedure: PPM GENERATOR CHANGEOUT;  Surgeon: Evans Lance, MD;  Location: [place] CV LAB;  Service: Cardiovascular;  Laterality: N/A;  . TOTAL KNEE ARTHROPLASTY Bilateral     Social History:  reports that she has never smoked. Her smokeless tobacco use includes snuff. She reports that she does not drink alcohol or use drugs.   No Known Allergies  Family History  Problem Relation Age of Onset  . Colon cancer Neg Hx      Prior to Admission medications   Medication Sig Start Date End Date Taking? Authorizing Provider  acetaminophen (TYLENOL) 500 MG tablet Take 1,000 mg by mouth every 6 (six) hours as needed for mild pain.    Yes [provider]  Doxepin HCl 5 % CREA Apply 0.5-1 g topically 4 (four) times daily. 09/12/19  Yes [provider]  hydrochlorothiazide (HYDRODIURIL) 12.5 MG tablet Take 12.5 mg by mouth daily. 12/04/18  Yes [provider]  HYDROcodone-acetaminophen (NORCO/VICODIN) 5-325 MG tablet Take 1 tablet by mouth every 6 (six) hours as needed for moderate pain or severe pain. 07/18/19  Yes Pfeiffer, Jeannie Done, MD  lidocaine-prilocaine (EMLA) cream Apply 1-2 g topically 4 (four) times daily.  09/12/19  Yes [provider]  losartan (COZAAR) 50 MG tablet Take 50 mg by mouth daily. 12/04/18  Yes [provider]  meclizine (ANTIVERT) 25 MG tablet Take 25 mg by mouth 3 (three) times daily as needed for dizziness.   Yes [provider]  Menthol-Methyl Salicylate (SALONPAS PAIN RELIEF PATCH EX) Place 1 patch onto the skin daily as needed (back pain.).   Yes [provider]  metFORMIN (GLUCOPHAGE) 1000 MG tablet Take 1,000 mg by mouth daily with breakfast.  09/09/14  Yes [provider]  Multiple Vitamins-Minerals (PRESERVISION AREDS 2 PO) Take 1 tablet by mouth daily.   Yes [provider]  ondansetron (ZOFRAN ODT) 4  MG disintegrating tablet Take 1 tablet (4 mg total) by mouth every 8 (eight) hours as needed for nausea or vomiting. 12/19/18  Yes Hernandez, Ana P, PA-C  Polyvinyl Alcohol-Povidone (REFRESH OP) Apply 1 drop to eye daily.    Yes [provider]  Trolamine Salicylate (ASPERCREME EX) Apply 1 application topically daily as needed (muscle aches).   Yes [provider]  valACYclovir (VALTREX) 1000 MG tablet Take 1 g by mouth daily as needed (cold sore/fever blisters).  11/23/18  Yes [provider]  Blood Glucose Monitoring Suppl (FREESTYLE LITE) DEVI  08/28/14   [provider]  cephALEXin (KEFLEX) 500 MG capsule Take 2 capsules (1,000 mg total) by mouth 2 (two) times daily. Patient not taking: Reported on 10/16/2019 07/18/19   Charlesetta Shanks, MD  FREESTYLE LITE test strip  08/28/14   [provider]  Lancets (FREESTYLE) lancets  08/28/14   [provider]     Objective    Physical Exam: Vitals:   10/16/19 1330 10/16/19 1608 10/16/19 1630 10/16/19 1700  BP: (!) 167/93 (!) 172/97 (!) 178/102 (!) 171/93  Pulse: 74 80 81 78  Resp: (!) 31 (!) 24 (!) 29 20  Temp:  (!) 97.3 F (36.3 C)    TempSrc:  Oral    SpO2: 95% 95% 96% 95%    General: appears to be stated age; somnolent; will briefly open eyes to verbal stimuli, but nonverbal and unable to follow instructions Skin: warm, dry, no rash Head:  AT/[place] Eyes:  PEARL b/l, EOMI Mouth:  Oral mucosa membranes appear dry, normal dentition Neck: supple; trachea midline Heart:  RRR; did not appreciate any M/R/G Lungs: CTAB, did not appreciate any wheezes, rales, or rhonchi Abdomen: + BS; soft, ND, NT Vascular: 2+ pedal pulses b/l; 2+ radial pulses b/l Extremities: no peripheral edema, no muscle wasting Neuro: In the setting of the patient's current mental status and associated inability to follow instructions, unable to perform full neurologic exam at this time.  As such, assessment of strength,  sensation, and cranial nerves is limited at this time. Patient noted to spontaneously move all 4 extremities. No tremors.     Labs on Admission: I have personally reviewed following labs and imaging studies  CBC: Recent Labs  Lab 10/16/19 1352  WBC 7.2  NEUTROABS 4.0  HGB 15.3*  HCT 46.9*  MCV 87.8  PLT 99991111   Basic Metabolic Panel: Recent Labs  Lab 10/16/19 1352  NA 141  K 3.3*  CL 103  CO2 24  GLUCOSE 173*  BUN 12  CREATININE 0.59  CALCIUM 9.5   GFR: CrCl cannot be calculated (Unknown ideal weight.). Liver Function Tests: Recent Labs  Lab 10/16/19 1352  AST 19  ALT 19  ALKPHOS 44  BILITOT 1.6*  PROT 7.9  ALBUMIN  4.3   No results for input(s): LIPASE, AMYLASE in the last 168 hours. Recent Labs  Lab 10/16/19 1352  AMMONIA 20   Coagulation Profile: No results for input(s): INR, PROTIME in the last 168 hours. Cardiac Enzymes: No results for input(s): CKTOTAL, CKMB, CKMBINDEX, TROPONINI in the last 168 hours. BNP (last 3 results) No results for input(s): PROBNP in the last 8760 hours. HbA1C: No results for input(s): HGBA1C in the last 72 hours. CBG: No results for input(s): GLUCAP in the last 168 hours. Lipid Profile: No results for input(s): CHOL, HDL, LDLCALC, TRIG, CHOLHDL, LDLDIRECT in the last 72 hours. Thyroid Function Tests: No results for input(s): TSH, T4TOTAL, FREET4, T3FREE, THYROIDAB in the last 72 hours. Anemia Panel: No results for input(s): VITAMINB12, FOLATE, FERRITIN, TIBC, IRON, RETICCTPCT in the last 72 hours. Urine analysis:    Component Value Date/Time   COLORURINE YELLOW 10/16/2019 [place] 10/16/2019 1352   LABSPEC 1.019 10/16/2019 1352   PHURINE 6.0 10/16/2019 1352   GLUCOSEU NEGATIVE 10/16/2019 1352   HGBUR NEGATIVE 10/16/2019 1352   BILIRUBINUR NEGATIVE 10/16/2019 1352   KETONESUR 5 (A) 10/16/2019 1352   PROTEINUR >=300 (A) 10/16/2019 1352   NITRITE NEGATIVE 10/16/2019 1352   LEUKOCYTESUR NEGATIVE  10/16/2019 1352    Radiological Exams on Admission: CT Head Wo Contrast  Result Date: 10/16/2019 CLINICAL DATA:  Altered mental status. EXAM: CT HEAD WITHOUT CONTRAST TECHNIQUE: Contiguous axial images were obtained from the base of the skull through the vertex without intravenous contrast. COMPARISON:  April 24, 2018 FINDINGS: Brain: There is mild cerebral atrophy with widening of the extra-axial spaces and ventricular dilatation. There are areas of decreased attenuation within the white matter tracts of the supratentorial brain, consistent with microvascular disease changes. A small chronic right thalamic infarct is seen. Vascular: No hyperdense vessel or unexpected calcification. Skull: Normal. Negative for fracture or focal lesion. Sinuses/Orbits: No acute finding. Other: None. IMPRESSION: No acute intracranial pathology. Electronically Signed   By: Virgina Norfolk M.D.   On: 10/16/2019 15:39   DG Chest Port 1 View  Result Date: 10/16/2019 CLINICAL DATA:  Altered level of consciousness, frequent urination, confusion EXAM: PORTABLE CHEST 1 VIEW COMPARISON:  07/19/2019 FINDINGS: Single frontal view of the chest demonstrates stable pacemaker. Cardiac silhouette is enlarged but stable. Chronic elevation the right hemidiaphragm. No airspace disease, effusion, or pneumothorax. Chronic degenerative changes of the bilateral shoulders. IMPRESSION: 1. Stable exam, no acute process. Electronically Signed   By: Randa Ngo M.D.   On: 10/16/2019 14:08   CUP PACEART REMOTE DEVICE CHECK  Result Date: 10/16/2019 Scheduled remote reviewed. Normal device function.  1 PAF, 3 min. 3 NSVT episodes, longest 9 beats Next remote 91 days- JBox, RN/CVRS   Assessment/Plan   Maria [name] is a 86 y.o. female with medical history significant for type 2 diabetes mellitus, complete heart block status post pacemaker, hypertension, who is admitted to [facility] on 10/16/2019 with acute encephalopathy after  presenting from home to [facility] Emergency Department for evaluation of altered mental status.    Principal Problem:   Acute encephalopathy Active Problems:   HTN (hypertension)   Elevated lactic acid level   Generalized weakness   Hypokalemia   Dehydration   #) Acute encephalopathy: Per patient's son, the patient has exhibited progressive confusion, somnolence, and development of visual hallucinations over the last 10 days following a reported ground-level mechanical fall at that time.  Primary source of patient's acute encephalopathy is not  currently clear at this time, although there is suspected contribution from dehydration, as further described below.  No evidence of overt underlying infectious process at this time, including no evidence of UTI or pneumonia.  Initially, COVID-19 PCR performed today was found to be negative.  There may be some contribution from a postconcussive syndrome given appearance of of ground-level mechanical fall this transition point leading-up to decline in mental status.  No obvious pharmacologic factors, including no reported recent changes to home medication regimen. No additional obvious metabolic/electrolyte contributions at this time. Acute subacute ischemic CVA is a possibility, although patient noted to be moving all 4 extremities spontaneously.  Seizures are also felt to be unlikely.  ABG demonstrates no evidence of hypercapnic encephalopathy. will keep patient NPO until mental status improves sufficiently that patient is able to participate in and pass nursing bedside swallow evaluation.    Plan: NPO. Nursing bedside swallow evaluation prior to the initiation of a diet/oral medications, as described above. CMP in the morning. Repeat CBC in the morning.  Check urinary drug screen as well as TSH.  Gentle IV fluids, as further described below.  May also consider MRI of the brain to evaluate for subacute ischemic CVA if underlying primary etiology  remains unclear following interval work-up.  Fall precautions.     #) Elevated lactic acid: Presenting lactic acid level mildly elevated at 2.3.  This appears to be on the basis of starvation keto lactic acidosis given patient's son's report of notable decline in oral intake over the last several days, with presenting urinalysis demonstrating evidence of ketones consistent with this process.  No evidence of underlying infectious process at this time, as further described above.  Of note, the patient is on Metformin, raising the possibility of type B lactic acidosis, however no change from baseline renal function, with presenting creatinine noted to be 0.59, rendering this possibility pharmacologic lactic acidosis to be much less likely.  Will provide gentle IV fluids, and order repeat lactic acid.  Of note, salicylate level found to be within normal limits.  Plan: Lactated Ringer's with 20 mEq/L of KCl at 75 cc/h x 12 hours.  Repeat lactic acid.  Check INR to evaluate synthetic hepatic function.  Repeat BMP and CBC in the morning.  Monitor strict I's and O's.  Hold home Metformin.      #) Hypokalemia: Presenting serum potassium noted to be 3.3, with suspected contribution stemming from recent decline in oral intake, as above.  Plan: Lactated Ringer's with 10 mEq/L KCl at 75 cc/h x 12 hours.  Repeat BMP in the morning.  Add on serum magnesium level.  Monitor on telemetry.      #) Dehydration: Clinically, the patient appears dehydrated, with evidence of dry oral mucous membranes on physical exam, as well as presenting labs demonstrating evidence of acute prerenal azotemia. On the basis of recently diminished oral intake, as above.  Plan: Gentle IV fluids overnight, as above.  Monitor strict I's and O's and daily weights.  Repeat BMP in the morning.      #) Type 2 Diabetes Mellitus: presenting blood sugar per presenting CMP noted to be 173.  Most recent hemoglobin A1c noted to be 6.7% when  checked in May 2018.  Noninsulin-dependent at home. Home oral hypoglycemic agents: Metformin.  Plan: accuchecks every 6 hours with low dose sliding scale insulin.  Hold home Metformin.  Check hemoglobin A1c.      #) Essential hypertension: Antihypertensive regimen consists of losartan and HCTZ.  Systolic blood pressures in the emergency department have ranged from the 160s to 180s mmHg. in the setting of current n.p.o. status due to acute encephalopathy, will hold home losartan and HCTZ for now.  Plan: Hold home antihypertensive medications for now, as above.  Close monitoring of ensuing blood pressure via routine vital signs.  As needed labetalol for systolic blood pressure greater than 180 mmHg. monitor on telemetry.    DVT prophylaxis: Lovenox 40 mg subcu daily Code Status: Full Family Communication: Case was discussed with the patient's son: Lorell Ibsen [phone]).  Disposition Plan: Per Rounding Team Consults called: None Admission status: Inpatient; med telemetry.   PLEASE NOTE THAT DRAGON DICTATION SOFTWARE WAS USED IN THE CONSTRUCTION OF THIS NOTE.   [place] [facility] Pager [phone] From [place].   Otherwise, please contact night-coverage  [email or web address] Password [facility]  10/16/2019, 6:34 PM

## 2019-10-16 NOTE — ED Notes (Signed)
Notified RT an ABG order.

## 2019-10-16 NOTE — ED Notes (Signed)
Dr. Velia Meyer made aware of pt's elevated systolic [id number] BP.  MD to put orders in for BP control.

## 2019-10-16 NOTE — ED Notes (Signed)
Pt transported to CT ?

## 2019-10-16 NOTE — ED Notes (Signed)
Pt in CT.

## 2019-10-16 NOTE — ED Provider Notes (Signed)
Assumed care from Dr. Maryan Rued.  Patient unable to get up with assistance.  She cannot stand and cannot sit up on her own.  She appears to be falling asleep and is hard to stay awake.  Her work-up is fairly unremarkable with a negative head CT and reassuring labs.  Nursing staff reports the patient is unable to sit up or stand or walk.  On reassessment she is somnolent and difficult to keep awake.  ABG was ordered to assess for hypercarbia.  Discussed with patient's son by phone Donnal Moat.  [phone].  He agrees she is not at her baseline.  He is agreeable to admission for further work-up of her encephalopathy.  Discussed with Dr. Velia Meyer. ABG shows no hypercarbia.   Ezequiel Essex, MD 10/16/19 Despina Pole

## 2019-10-16 NOTE — ED Notes (Signed)
Attempted to have patient  sit up from the stretcher with assistance of Megan, RN and patient is UNABLE to even sit up, more less attempt to walk. She states she can walk at home, but patient is falling a sleep when repositioning her up in bed. She is unable to ambulate and based on my professional assessment, it is not related to stubbornness or laziness, it is related to medical reason and strength.

## 2019-10-16 NOTE — ED Notes (Signed)
Date and time results received: 10/16/19 15:12 (use smartphrase ".now" to insert current time)  Test: Lactic Acid  Critical Value: 2.3  Name of Provider Notified: Dr. Purcell Mouton  Orders Received? Or Actions Taken?: Continue to monitor patient and await new orders.

## 2019-10-17 ENCOUNTER — Other Ambulatory Visit: Payer: Self-pay

## 2019-10-17 DIAGNOSIS — E860 Dehydration: Secondary | ICD-10-CM | POA: Diagnosis present

## 2019-10-17 DIAGNOSIS — R531 Weakness: Secondary | ICD-10-CM

## 2019-10-17 DIAGNOSIS — E876 Hypokalemia: Secondary | ICD-10-CM | POA: Diagnosis present

## 2019-10-17 DIAGNOSIS — R7989 Other specified abnormal findings of blood chemistry: Secondary | ICD-10-CM | POA: Diagnosis present

## 2019-10-17 LAB — COMPREHENSIVE METABOLIC PANEL
ALT: 22 U/L (ref 0–44)
AST: 26 U/L (ref 15–41)
Albumin: 3.9 g/dL (ref 3.5–5.0)
Alkaline Phosphatase: 41 U/L (ref 38–126)
Anion gap: 16 — ABNORMAL HIGH (ref 5–15)
BUN: 13 mg/dL (ref 8–23)
CO2: 19 mmol/L — ABNORMAL LOW (ref 22–32)
Calcium: 9.1 mg/dL (ref 8.9–10.3)
Chloride: 104 mmol/L (ref 98–111)
Creatinine, Ser: 0.61 mg/dL (ref 0.44–1.00)
GFR calc Af Amer: >60 mL/min (ref >60)
GFR calc non Af Amer: >60 mL/min (ref >60)
Glucose, Bld: 175 mg/dL — ABNORMAL HIGH (ref 70–99)
Potassium: 3.4 mmol/L — ABNORMAL LOW (ref 3.5–5.1)
Sodium: 139 mmol/L (ref 135–145)
Total Bilirubin: 1.7 mg/dL — ABNORMAL HIGH (ref 0.3–1.2)
Total Protein: 7.4 g/dL (ref 6.5–8.1)

## 2019-10-17 LAB — CBC
HCT: 45.7 % (ref 36.0–46.0)
Hemoglobin: 14.5 g/dL (ref 12.0–15.0)
MCH: 28.4 pg (ref 26.0–34.0)
MCHC: 31.7 g/dL (ref 30.0–36.0)
MCV: 89.4 fL (ref 80.0–100.0)
Platelets: 206 10*3/uL (ref 150–400)
RBC: 5.11 MIL/uL (ref 3.87–5.11)
RDW: 13.7 % (ref 11.5–15.5)
WBC: 6.6 10*3/uL (ref 4.0–10.5)
nRBC: 0 % (ref 0.0–0.2)

## 2019-10-17 LAB — MAGNESIUM
Magnesium: 1.5 mg/dL — ABNORMAL LOW (ref 1.7–2.4)
Magnesium: 1.6 mg/dL — ABNORMAL LOW (ref 1.7–2.4)

## 2019-10-17 LAB — RAPID URINE DRUG SCREEN, HOSP PERFORMED
Amphetamines: NOT DETECTED
Barbiturates: NOT DETECTED
Benzodiazepines: NOT DETECTED
Cocaine: NOT DETECTED
Opiates: NOT DETECTED
Tetrahydrocannabinol: NOT DETECTED

## 2019-10-17 LAB — PROTIME-INR
INR: 1 (ref 0.8–1.2)
Prothrombin Time: 13.3 seconds (ref 11.4–15.2)

## 2019-10-17 LAB — URINE CULTURE: Culture: NO GROWTH

## 2019-10-17 LAB — HEMOGLOBIN A1C
Hgb A1c MFr Bld: 6.5 % — ABNORMAL HIGH (ref 4.8–5.6)
Mean Plasma Glucose: 139.85 mg/dL

## 2019-10-17 LAB — LIPID PANEL
Cholesterol: 242 mg/dL — ABNORMAL HIGH (ref 0–200)
HDL: 33 mg/dL — ABNORMAL LOW (ref >40)
LDL Cholesterol: 178 mg/dL — ABNORMAL HIGH (ref 0–99)
Total CHOL/HDL Ratio: 7.3 RATIO
Triglycerides: 155 mg/dL — ABNORMAL HIGH (ref <150)
VLDL: 31 mg/dL (ref 0–40)

## 2019-10-17 LAB — GLUCOSE, CAPILLARY
Glucose-Capillary: 140 mg/dL — ABNORMAL HIGH (ref 70–99)
Glucose-Capillary: 150 mg/dL — ABNORMAL HIGH (ref 70–99)
Glucose-Capillary: 163 mg/dL — ABNORMAL HIGH (ref 70–99)
Glucose-Capillary: 164 mg/dL — ABNORMAL HIGH (ref 70–99)

## 2019-10-17 LAB — TSH: TSH: 0.338 u[IU]/mL — ABNORMAL LOW (ref 0.350–4.500)

## 2019-10-17 MED ORDER — BISACODYL 10 MG RE SUPP
10.0000 mg | Freq: Once | RECTAL | Status: AC
  Administered 2019-10-17: 10 mg via RECTAL
  Filled 2019-10-17: qty 1

## 2019-10-17 MED ORDER — POTASSIUM CHLORIDE 10 MEQ/100ML IV SOLN
10.0000 meq | Freq: Once | INTRAVENOUS | Status: AC
  Administered 2019-10-17: 10 meq via INTRAVENOUS
  Filled 2019-10-17: qty 100

## 2019-10-17 MED ORDER — POTASSIUM CHLORIDE 10 MEQ/100ML IV SOLN
10.0000 meq | INTRAVENOUS | Status: AC
  Administered 2019-10-17: 06:00:00 10 meq via INTRAVENOUS
  Filled 2019-10-17: qty 100

## 2019-10-17 MED ORDER — MAGNESIUM SULFATE 4 GM/100ML IV SOLN
4.0000 g | Freq: Once | INTRAVENOUS | Status: AC
  Administered 2019-10-17: 4 g via INTRAVENOUS
  Filled 2019-10-17: qty 100

## 2019-10-17 MED ORDER — POLYETHYLENE GLYCOL 3350 17 G PO PACK
17.0000 g | PACK | Freq: Every day | ORAL | Status: DC
  Administered 2019-10-17 – 2019-10-19 (×3): 17 g via ORAL
  Filled 2019-10-17 (×3): qty 1

## 2019-10-17 MED ORDER — ASPIRIN EC 81 MG PO TBEC
81.0000 mg | DELAYED_RELEASE_TABLET | Freq: Every day | ORAL | Status: DC
  Administered 2019-10-17 – 2019-10-19 (×3): 81 mg via ORAL
  Filled 2019-10-17 (×3): qty 1

## 2019-10-17 MED ORDER — SENNOSIDES-DOCUSATE SODIUM 8.6-50 MG PO TABS
2.0000 | ORAL_TABLET | Freq: Two times a day (BID) | ORAL | Status: DC
  Administered 2019-10-17 – 2019-10-19 (×5): 2 via ORAL
  Filled 2019-10-17 (×5): qty 2

## 2019-10-17 MED ORDER — GUAIFENESIN-DM 100-10 MG/5ML PO SYRP
5.0000 mL | ORAL_SOLUTION | ORAL | Status: DC | PRN
  Filled 2019-10-17: qty 10

## 2019-10-17 MED ORDER — ATORVASTATIN CALCIUM 20 MG PO TABS
20.0000 mg | ORAL_TABLET | Freq: Every day | ORAL | Status: DC
  Administered 2019-10-17: 18:00:00 20 mg via ORAL
  Filled 2019-10-17: qty 1

## 2019-10-17 MED ORDER — LACTATED RINGERS IV SOLN: INTRAVENOUS | Status: AC

## 2019-10-17 NOTE — Progress Notes (Signed)
Called the patient's daughter [name] who is also her POA via phone to give updates.  All questions answered.  Poor oral intake and not wanting to get out of bed since her fall about a week ago.

## 2019-10-17 NOTE — Progress Notes (Signed)
PPM Remote  

## 2019-10-17 NOTE — Progress Notes (Signed)
Spoke with Laural Benes to arrange MRI of brain, pt has pacemaker and MRI has to be done at [facility]. Laural Benes will follow-up with arrangement later today. SRP, RN

## 2019-10-17 NOTE — Progress Notes (Signed)
Initial Nutrition Assessment  DOCUMENTATION CODES:   Not applicable  INTERVENTION:  - diet advancement as medically feasible.    NUTRITION DIAGNOSIS:   Inadequate oral intake related to inability to eat as evidenced by NPO status.  GOAL:   Patient will meet greater than or equal to 90% of their needs  MONITOR:   Diet advancement, Labs, Weight trends  REASON FOR ASSESSMENT:   Malnutrition Screening Tool  ASSESSMENT:   86 y.o. female with medical history significant for type 2 DM, complete heart block s/p pacemaker, and HTN. She was admitted to [facility] on 2/2 from home due to AMS. She lives alone and is able to complete ADLs. Her son lives nearby and checks on her several times per week. Approximately 10 days PTA, her son reports that the patient told to him that she had experienced a ground-level mechanical fall that resulted from her tripping while ambulating. She had denied hitting her head or having any loss of consciousness. Her son reported progressive confusion and new onset of visual hallucinations since the time of the fall. Her son reported that she had had decreased oral intake over the past few days.  Patient has been NPO since admission. She is noted to be a/o to self and place. She was sleeping at the time of visit and no family/visitors present. Son had reported in the ED that patient had not eaten well for several days PTA. Per chart review, weight today is 138 lb and weight on 07/27/19 was 147 lb. This indicates 9 lb weight loss (6% body weight) in 2 months; not significant for time frame.  Per notes: - acute encephalopathy  - plan to remain NPO at this time - possible MRI brain - elevated lactic acid   Labs reviewed; CBGs: 164 and 163 mg/dl, K: 3.4 mmol/l, Mg: 1.6 mg/dl. Medications reviewed; sliding scale novolog, 4 g IV Mg sulfate x1 run 2/3.    NUTRITION - FOCUSED PHYSICAL EXAM:  completed; no muscle or fat wasting noted, no edema noted.   Diet Order:    Diet Order            Diet NPO time specified  Diet effective now              EDUCATION NEEDS:   No education needs have been identified at this time  Skin:  Skin Assessment: Reviewed RN Assessment  Last BM:  PTA/unknown  Height:   Ht Readings from Last 1 Encounters:  10/16/19 5\' 4"  (1.626 m)    Weight:   Wt Readings from Last 1 Encounters:  10/17/19 62.5 kg    Ideal Body Weight:  54.5 kg  BMI:  Body mass index is 23.65 kg/m.  Estimated Nutritional Needs:   Kcal:  1560-1750 kcal  Protein:  65-75 grams  Fluid:  >/= 1.6 L/day     Jarome Matin, MS, RD, LDN, [facility] Inpatient Clinical Dietitian Pager # [phone] After hours/weekend pager # [phone]

## 2019-10-17 NOTE — Progress Notes (Signed)
PROGRESS NOTE  LANGLEY FLATLEY CLE:[id number] DOB: [birth year] DOA: 10/16/2019 PCP: Audley Hose, MD  HPI/Recap of past 24 hours:  Maria [name] is a 86 y.o. female with medical history significant for type 2 diabetes mellitus, complete heart block status post pacemaker, hypertension, who is admitted to [facility] on 10/16/2019 with acute encephalopathy after presenting from home.  Poor oral intake, minimal interaction, refusing to ambulate, and not wanting to get out of bed.  At baseline ambulates with a walker.  UA and CXR unrevealing.  CT head shows small chronic right thalamic infarct.  Spoke with daughter Maria [name] not aware of prior hx of CVA.  10/17/19: seen and examined.  Somnolent and minimally interactive. Denies pain.  Ordered a MRI brain.  She has a pacemaker, unclear if compatible to MRI.   Assessment/Plan: Principal Problem:   Acute encephalopathy Active Problems:   HTN (hypertension)   Elevated lactic acid level   Generalized weakness   Hypokalemia   Dehydration   Acute metabolic encephalopathy in the setting of suspected prior CVA MRI brain ordered to confirm old CVA Per daughter her memory was not great prior to this but no diagnosis of dementia UA and CXR unrevealing Reorient Fall precaution Oral intake as tolerated  Prior CVA seen on CT head Admission CT head shows small chronic right thalamic infarct.  Spoke with daughter Maria [name] not aware of prior hx of CVA. Management per above Obtain lipid panel A1C 6.5 (10/17/19) Start ASA and low dose statin  Dysphagia Per her daughter on soft diet at home Speech therapy eval Aspirations precautions Diet as tolerated  Dehydration Hypovolemic on exam Continue iV fluid Encourage oral intake as tolerated if can swallow safely Monitor UO  Hypokalemia K+ 3.4 Replaced  Hypomagnesemia Mg2+ 1.6 Replaced  Isolated hyperbilirubenemia Tbili 1.7 IV fluid repeat level in the am AST ALT Alk ph normal  Ambulatory  dysfunction/physical debility PT OT to assess Fall precautions  Essential HTN Resume po home meds  Type 2 diabetes, well controlled Start ISS A1C 6.5   DVT prophylaxis: Lovenox 40 mg subcu daily Code Status: Full Family Communication: Case was discussed with the patient's son: Maria [name] [phone]).    Disposition Plan: Patient is from home. Lives alone.  Anticipate dc to SNF.  Barrier to dc: Ongoing workup for encephalopathy.   Consults called: None    Objective: Vitals:   10/16/19 2102 10/17/19 0532 10/17/19 0601 10/17/19 0841  BP: (!) 155/94 (!) 177/102 (!) 152/79   Pulse: 68 79 63   Resp:  18    Temp: 98.4 F (36.9 C) 98.2 F (36.8 C)    TempSrc: Oral     SpO2: 98% 97%  94%  Weight: 62.5 kg 62.5 kg    Height: '5\' 4"'  (1.626 m)       Intake/Output Summary (Last 24 hours) at 10/17/2019 1204 Last data filed at 10/17/2019 3846 Gross per 24 hour  Intake 800 ml  Output --  Net 800 ml   Filed Weights   10/16/19 2102 10/17/19 0532  Weight: 62.5 kg 62.5 kg    Exam:  . General: 86 y.o. year-old female well developed well nourished in no acute distress.  Somnolent and minimally interactive. . Cardiovascular: Regular rate and rhythm with no rubs or gallops.  No thyromegaly or JVD noted.   Marland Kitchen Respiratory: Clear to auscultation with no wheezes or rales. Good inspiratory effort. . Abdomen: Soft nontender nondistended with normal bowel sounds x4 quadrants. . Musculoskeletal: Trace  lower extremity edema. Marland Kitchen Psychiatry: Mood is appropriate for condition and setting   Data Reviewed: CBC: Recent Labs  Lab 10/16/19 1352 10/17/19 0354  WBC 7.2 6.6  NEUTROABS 4.0  --   HGB 15.3* 14.5  HCT 46.9* 45.7  MCV 87.8 89.4  PLT 206 235   Basic Metabolic Panel: Recent Labs  Lab 10/16/19 1352 10/17/19 0354  NA 141 139  K 3.3* 3.4*  CL 103 104  CO2 24 19*  GLUCOSE 173* 175*  BUN 12 13  CREATININE 0.59 0.61  CALCIUM 9.5 9.1  MG 1.5* 1.6*   GFR: Estimated  Creatinine Clearance: 37.9 mL/min (by C-G formula based on SCr of 0.61 mg/dL). Liver Function Tests: Recent Labs  Lab 10/16/19 1352 10/17/19 0354  AST 19 26  ALT 19 22  ALKPHOS 44 41  BILITOT 1.6* 1.7*  PROT 7.9 7.4  ALBUMIN 4.3 3.9   No results for input(s): LIPASE, AMYLASE in the last 168 hours. Recent Labs  Lab 10/16/19 1352  AMMONIA 20   Coagulation Profile: Recent Labs  Lab 10/17/19 0354  INR 1.0   Cardiac Enzymes: No results for input(s): CKTOTAL, CKMB, CKMBINDEX, TROPONINI in the last 168 hours. BNP (last 3 results) No results for input(s): PROBNP in the last 8760 hours. HbA1C: Recent Labs    10/17/19 0354  HGBA1C 6.5*   CBG: Recent Labs  Lab 10/17/19 0036 10/17/19 0523 10/17/19 1128  GLUCAP 164* 163* 140*   Lipid Profile: No results for input(s): CHOL, HDL, LDLCALC, TRIG, CHOLHDL, LDLDIRECT in the last 72 hours. Thyroid Function Tests: Recent Labs    10/17/19 0354  TSH 0.338*   Anemia Panel: No results for input(s): VITAMINB12, FOLATE, FERRITIN, TIBC, IRON, RETICCTPCT in the last 72 hours. Urine analysis:    Component Value Date/Time   COLORURINE YELLOW 10/16/2019 [place] 10/16/2019 1352   LABSPEC 1.019 10/16/2019 1352   PHURINE 6.0 10/16/2019 1352   GLUCOSEU NEGATIVE 10/16/2019 1352   HGBUR NEGATIVE 10/16/2019 1352   BILIRUBINUR NEGATIVE 10/16/2019 1352   KETONESUR 5 (A) 10/16/2019 1352   PROTEINUR >=300 (A) 10/16/2019 1352   NITRITE NEGATIVE 10/16/2019 1352   LEUKOCYTESUR NEGATIVE 10/16/2019 1352   Sepsis Labs: '@LABRCNTIP' (procalcitonin:4,lacticidven:4)  ) Recent Results (from the past 240 hour(s))  Respiratory Panel by RT PCR (Flu A&B, Covid) - Nasopharyngeal Swab     Status: None   Collection Time: 10/16/19  4:07 PM   Specimen: Nasopharyngeal Swab  Result Value Ref Range Status   SARS Coronavirus 2 by RT PCR NEGATIVE NEGATIVE Final    Comment: (NOTE) SARS-CoV-2 target nucleic acids are NOT DETECTED. The  SARS-CoV-2 RNA is generally detectable in upper respiratoy specimens during the acute phase of infection. The lowest concentration of SARS-CoV-2 viral copies this assay can detect is 131 copies/mL. A negative result does not preclude SARS-Cov-2 infection and should not be used as the sole basis for treatment or other patient management decisions. A negative result may occur with  improper specimen collection/handling, submission of specimen other than nasopharyngeal swab, presence of viral mutation(s) within the areas targeted by this assay, and inadequate number of viral copies (<131 copies/mL). A negative result must be combined with clinical observations, patient history, and epidemiological information. The expected result is Negative. Fact Sheet for Patients:  PinkCheek.be Fact Sheet for Healthcare Providers:  GravelBags.it This test is not yet ap proved or cleared by the [place] FDA and  has been authorized for detection and/or diagnosis of SARS-CoV-2 by FDA under  an Emergency Use Authorization (EUA). This EUA will remain  in effect (meaning this test can be used) for the duration of the COVID-19 declaration under Section 564(b)(1) of the Act, 21 U.S.C. section 360bbb-3(b)(1), unless the authorization is terminated or revoked sooner.    Influenza A by PCR NEGATIVE NEGATIVE Final   Influenza B by PCR NEGATIVE NEGATIVE Final    Comment: (NOTE) The Xpert Xpress SARS-CoV-2/FLU/RSV assay is intended as an aid in  the diagnosis of influenza from Nasopharyngeal swab specimens and  should not be used as a sole basis for treatment. Nasal washings and  aspirates are unacceptable for Xpert Xpress SARS-CoV-2/FLU/RSV  testing. Fact Sheet for Patients: PinkCheek.be Fact Sheet for Healthcare Providers: GravelBags.it This test is not yet approved or cleared by the [place] FDA and  has been authorized for detection and/or diagnosis of SARS-CoV-2 by  FDA under an Emergency Use Authorization (EUA). This EUA will remain  in effect (meaning this test can be used) for the duration of the  Covid-19 declaration under Section 564(b)(1) of the Act, 21  U.S.C. section 360bbb-3(b)(1), unless the authorization is  terminated or revoked. Performed at [facility], [place] [address], [place], [place] [address]       Studies: CT Head Wo Contrast  Result Date: 10/16/2019 CLINICAL DATA:  Altered mental status. EXAM: CT HEAD WITHOUT CONTRAST TECHNIQUE: Contiguous axial images were obtained from the base of the skull through the vertex without intravenous contrast. COMPARISON:  April 24, 2018 FINDINGS: Brain: There is mild cerebral atrophy with widening of the extra-axial spaces and ventricular dilatation. There are areas of decreased attenuation within the white matter tracts of the supratentorial brain, consistent with microvascular disease changes. A small chronic right thalamic infarct is seen. Vascular: No hyperdense vessel or unexpected calcification. Skull: Normal. Negative for fracture or focal lesion. Sinuses/Orbits: No acute finding. Other: None. IMPRESSION: No acute intracranial pathology. Electronically Signed   By: Virgina Norfolk M.D.   On: 10/16/2019 15:39   DG Chest Port 1 View  Result Date: 10/16/2019 CLINICAL DATA:  Altered level of consciousness, frequent urination, confusion EXAM: PORTABLE CHEST 1 VIEW COMPARISON:  07/19/2019 FINDINGS: Single frontal view of the chest demonstrates stable pacemaker. Cardiac silhouette is enlarged but stable. Chronic elevation the right hemidiaphragm. No airspace disease, effusion, or pneumothorax. Chronic degenerative changes of the bilateral shoulders. IMPRESSION: 1. Stable exam, no acute process. Electronically Signed   By: Randa Ngo M.D.   On: 10/16/2019 14:08    Scheduled Meds: . enoxaparin  (LOVENOX) injection  40 mg Subcutaneous Q24H  . insulin aspart  0-9 Units Subcutaneous Q6H  . sodium chloride flush  3 mL Intravenous Q12H    Continuous Infusions: . lactated ringers    . potassium chloride       LOS: 1 day     Kayleen Memos, MD [facility] Pager [phone]  If 7PM-7AM, please contact night-coverage [email or web address] Password [facility] 10/17/2019, 12:04 PM

## 2019-10-17 NOTE — Progress Notes (Addendum)
Spoke with Sharla in MRI, pt not a candidate for MRIs due to lead placement in Pacemaker is incompatible. MD made aware. SRP, RN

## 2019-10-17 NOTE — Plan of Care (Signed)
Initiated care plan 

## 2019-10-17 NOTE — Plan of Care (Signed)
  Problem: Clinical Measurements: Goal: Ability to maintain clinical measurements within normal limits will improve Outcome: Progressing   

## 2019-10-17 NOTE — Progress Notes (Signed)
Pt more alert and talking with family on the phone. Pt alert, oriented to name and place. Pt ate 100% of dinner meal denies pain. Had large BM, voiding dark amber urine. Will cont to monitor. SRP, RN

## 2019-10-18 LAB — PHOSPHORUS: Phosphorus: 3.2 mg/dL (ref 2.5–4.6)

## 2019-10-18 LAB — COMPREHENSIVE METABOLIC PANEL
ALT: 23 U/L (ref 0–44)
AST: 25 U/L (ref 15–41)
Albumin: 3.7 g/dL (ref 3.5–5.0)
Alkaline Phosphatase: 35 U/L — ABNORMAL LOW (ref 38–126)
Anion gap: 12 (ref 5–15)
BUN: 13 mg/dL (ref 8–23)
CO2: 25 mmol/L (ref 22–32)
Calcium: 8.9 mg/dL (ref 8.9–10.3)
Chloride: 101 mmol/L (ref 98–111)
Creatinine, Ser: 0.53 mg/dL (ref 0.44–1.00)
GFR calc Af Amer: >60 mL/min (ref >60)
GFR calc non Af Amer: >60 mL/min (ref >60)
Glucose, Bld: 172 mg/dL — ABNORMAL HIGH (ref 70–99)
Potassium: 3.3 mmol/L — ABNORMAL LOW (ref 3.5–5.1)
Sodium: 138 mmol/L (ref 135–145)
Total Bilirubin: 1.8 mg/dL — ABNORMAL HIGH (ref 0.3–1.2)
Total Protein: 6.8 g/dL (ref 6.5–8.1)

## 2019-10-18 LAB — CBC
HCT: 40.4 % (ref 36.0–46.0)
Hemoglobin: 13.2 g/dL (ref 12.0–15.0)
MCH: 28.3 pg (ref 26.0–34.0)
MCHC: 32.7 g/dL (ref 30.0–36.0)
MCV: 86.5 fL (ref 80.0–100.0)
Platelets: 195 10*3/uL (ref 150–400)
RBC: 4.67 MIL/uL (ref 3.87–5.11)
RDW: 13.6 % (ref 11.5–15.5)
WBC: 6.9 10*3/uL (ref 4.0–10.5)
nRBC: 0 % (ref 0.0–0.2)

## 2019-10-18 LAB — GLUCOSE, CAPILLARY
Glucose-Capillary: 143 mg/dL — ABNORMAL HIGH (ref 70–99)
Glucose-Capillary: 148 mg/dL — ABNORMAL HIGH (ref 70–99)
Glucose-Capillary: 163 mg/dL — ABNORMAL HIGH (ref 70–99)
Glucose-Capillary: 188 mg/dL — ABNORMAL HIGH (ref 70–99)
Glucose-Capillary: 282 mg/dL — ABNORMAL HIGH (ref 70–99)
Glucose-Capillary: 94 mg/dL (ref 70–99)

## 2019-10-18 LAB — MAGNESIUM: Magnesium: 1.7 mg/dL (ref 1.7–2.4)

## 2019-10-18 MED ORDER — INSULIN ASPART 100 UNIT/ML ~~LOC~~ SOLN
0.0000 [IU] | Freq: Three times a day (TID) | SUBCUTANEOUS | Status: DC
  Administered 2019-10-18: 09:00:00 2 [IU] via SUBCUTANEOUS
  Administered 2019-10-18: 13:00:00 5 [IU] via SUBCUTANEOUS
  Administered 2019-10-19: 18:00:00 1 [IU] via SUBCUTANEOUS
  Administered 2019-10-19: 09:00:00 2 [IU] via SUBCUTANEOUS

## 2019-10-18 MED ORDER — LOSARTAN POTASSIUM 50 MG PO TABS
50.0000 mg | ORAL_TABLET | Freq: Every day | ORAL | Status: DC
  Administered 2019-10-18 – 2019-10-19 (×2): 50 mg via ORAL
  Filled 2019-10-18 (×2): qty 1

## 2019-10-18 MED ORDER — INSULIN ASPART 100 UNIT/ML ~~LOC~~ SOLN: 0.0000 [IU] | Freq: Every day | SUBCUTANEOUS | Status: DC

## 2019-10-18 MED ORDER — MAGNESIUM SULFATE 2 GM/50ML IV SOLN
2.0000 g | Freq: Once | INTRAVENOUS | Status: AC
  Administered 2019-10-18: 2 g via INTRAVENOUS
  Filled 2019-10-18: qty 50

## 2019-10-18 MED ORDER — POTASSIUM CHLORIDE CRYS ER 20 MEQ PO TBCR
40.0000 meq | EXTENDED_RELEASE_TABLET | Freq: Once | ORAL | Status: AC
  Administered 2019-10-18: 40 meq via ORAL
  Filled 2019-10-18: qty 2

## 2019-10-18 MED ORDER — DIPHENHYDRAMINE HCL 50 MG/ML IJ SOLN
25.0000 mg | Freq: Once | INTRAMUSCULAR | Status: AC
  Administered 2019-10-18: 25 mg via INTRAVENOUS
  Filled 2019-10-18: qty 1

## 2019-10-18 MED ORDER — ATORVASTATIN CALCIUM 40 MG PO TABS
40.0000 mg | ORAL_TABLET | Freq: Every day | ORAL | Status: DC
  Administered 2019-10-18 – 2019-10-19 (×2): 40 mg via ORAL
  Filled 2019-10-18 (×2): qty 1

## 2019-10-18 NOTE — Progress Notes (Signed)
PROGRESS NOTE  Maria [name] FWY:[id number] DOB: Dec 07, 1923 DOA: 10/16/2019 PCP: Audley Hose, MD  HPI/Recap of past 24 hours:  Maria [name] is a 86 y.o. female with medical history significant for type 2 diabetes mellitus, complete heart block status post pacemaker, hypertension, who is admitted to [facility] on 10/16/2019 with acute encephalopathy after presenting from home.  Poor oral intake, minimal interaction, refusing to ambulate, and not wanting to get out of bed.  At baseline ambulates with a walker.  UA and CXR unrevealing.  CT head shows small chronic right thalamic infarct.  Spoke with daughter [name] not aware of prior hx of CVA.  10/18/19: Seen and examined.  More alert and interactive today.  10/17/19: seen and examined.  Somnolent and minimally interactive. Denies pain.  Ordered a MRI brain.  She has a pacemaker, unclear if compatible to MRI.   Assessment/Plan: Principal Problem:   Acute encephalopathy Active Problems:   HTN (hypertension)   Elevated lactic acid level   Generalized weakness   Hypokalemia   Dehydration   Improving Acute metabolic encephalopathy in the setting of suspected prior CVA MRI brain ordered to confirm old CVA.  Pacemaker not compatible with MRI UA and CXR unrevealing Reorient Fall precaution Oral intake as tolerated  Prior CVA seen on CT head Admission CT head shows small chronic right thalamic infarct.  Spoke with daughter [name] not aware of prior hx of CVA. Management per above LDL 178 on 2/3/221; goal <70 A1C 6.5 (10/17/19) c/w ASA and statin Increase statin dose to 40 mg daily  Dysphagia Per her daughter on soft diet at home Speech therapy eval recs soft and thin liquids Aspirations precautions Diet as tolerated  Dehydration Hypovolemic on exam Continue iV fluid Encourage oral intake as tolerated if can swallow safely Monitor UO  Hypokalemia K+ 3.4>> 3.3 Replaced with po kcl 40 meq once Losartan should  help  Hypomagnesemia Mg2+ 1.6>> 1.7 Replaced w IV mag 2g once  Isolated hyperbilirubenemia Tbili 1.7>> 1.8 IV fluid repeat level in the am AST ALT Alk ph normal  Ambulatory dysfunction/physical debility PT OT to assess Fall precautions  Essential HTN Resume po home meds  Type 2 diabetes, well controlled c/w ISS A1C 6.5   DVT prophylaxis: Lovenox 40 mg subcu daily Code Status: Full Family Communication: Case was discussed with the patient's son: Nandita Mathenia [phone]).    Disposition Plan: Patient is from home. Lives alone.  Anticipate dc to SNF.  Barrier to dc: Ongoing workup for encephalopathy.   Consults called: None    Objective: Vitals:   10/18/19 0423 10/18/19 0535 10/18/19 0607 10/18/19 1359  BP: (!) 182/98 (!) 161/82  129/60  Pulse: 84 74  64  Resp: 18   18  Temp: 98.2 F (36.8 C)   (!) 97.3 F (36.3 C)  TempSrc: Oral   Oral  SpO2: 96%   100%  Weight:   63.5 kg   Height:        Intake/Output Summary (Last 24 hours) at 10/18/2019 1721 Last data filed at 10/18/2019 1500 Gross per 24 hour  Intake 2374.65 ml  Output 500 ml  Net 1874.65 ml   Filed Weights   10/16/19 2102 10/17/19 0532 10/18/19 0607  Weight: 62.5 kg 62.5 kg 63.5 kg    Exam:  . General: 86 y.o. year-old female well-developed well-nourished no acute distress.  Alert and more interactive.   . Cardiovascular: Regular rate and rhythm no rubs or gallops.   Marland Kitchen  Respiratory: Clear to auscultation no wheezes or rales.   . Abdomen: Soft normal bowel sounds present.   . Musculoskeletal: No lower extremity edema bilaterally.   Psychiatry: Mood is appropriate for condition and setting.  Data Reviewed: CBC: Recent Labs  Lab 10/16/19 1352 10/17/19 0354 10/18/19 0618  WBC 7.2 6.6 6.9  NEUTROABS 4.0  --   --   HGB 15.3* 14.5 13.2  HCT 46.9* 45.7 40.4  MCV 87.8 89.4 86.5  PLT 206 206 932   Basic Metabolic Panel: Recent Labs  Lab 10/16/19 1352 10/17/19 0354 10/18/19 0618  NA  141 139 138  K 3.3* 3.4* 3.3*  CL 103 104 101  CO2 24 19* 25  GLUCOSE 173* 175* 172*  BUN _0 CREATININE 0.59 0.61 0.53  CALCIUM 9.5 9.1 8.9  MG 1.5* 1.6* 1.7  PHOS  --   --  3.2   GFR: Estimated Creatinine Clearance: 37.9 mL/min (by C-G formula based on SCr of 0.53 mg/dL). Liver Function Tests: Recent Labs  Lab 10/16/19 1352 10/17/19 0354 10/18/19 0618  AST _1 ALT _2 ALKPHOS 44 41 35*  BILITOT 1.6* 1.7* 1.8*  PROT 7.9 7.4 6.8  ALBUMIN 4.3 3.9 3.7   No results for input(s): LIPASE, AMYLASE in the last 168 hours. Recent Labs  Lab 10/16/19 1352  AMMONIA 20   Coagulation Profile: Recent Labs  Lab 10/17/19 0354  INR 1.0   Cardiac Enzymes: No results for input(s): CKTOTAL, CKMB, CKMBINDEX, TROPONINI in the last 168 hours. BNP (last 3 results) No results for input(s): PROBNP in the last 8760 hours. HbA1C: Recent Labs    10/17/19 0354  HGBA1C 6.5*   CBG: Recent Labs  Lab 10/17/19 2359 10/18/19 0604 10/18/19 0841 10/18/19 1144 10/18/19 1652  GLUCAP 143* 163* 188* 282* 94   Lipid Profile: Recent Labs    10/17/19 0354  CHOL 242*  HDL 33*  LDLCALC 178*  TRIG 155*  CHOLHDL 7.3   Thyroid Function Tests: Recent Labs    10/17/19 0354  TSH 0.338*   Anemia Panel: No results for input(s): VITAMINB12, FOLATE, FERRITIN, TIBC, IRON, RETICCTPCT in the last 72 hours. Urine analysis:    Component Value Date/Time   COLORURINE YELLOW 10/16/2019 [place] 10/16/2019 1352   LABSPEC 1.019 10/16/2019 1352   PHURINE 6.0 10/16/2019 1352   GLUCOSEU NEGATIVE 10/16/2019 1352   HGBUR NEGATIVE 10/16/2019 1352   BILIRUBINUR NEGATIVE 10/16/2019 1352   KETONESUR 5 (A) 10/16/2019 1352   PROTEINUR >=300 (A) 10/16/2019 1352   NITRITE NEGATIVE 10/16/2019 1352   LEUKOCYTESUR NEGATIVE 10/16/2019 1352   Sepsis Labs: _3 (procalcitonin:4,lacticidven:4)  ) Recent Results (from the past 240 hour(s))  Urine Culture     Status:  None   Collection Time: 10/16/19  1:52 PM   Specimen: Urine, Clean Catch  Result Value Ref Range Status   Specimen Description   Final    URINE, CLEAN CATCH Performed at [facility], [place] [address] [place] [address] [place], [place] [address]    Special Requests   Final    NONE Performed at [facility], [place] [address], [place], [place] [address]    Culture   Final    NO GROWTH Performed at [facility], [place] [address] [place], [place] [address]    Report Status 10/17/2019 FINAL  Final  Respiratory Panel by RT PCR (Flu A&B, Covid) - Nasopharyngeal Swab     Status: None  Collection Time: 10/16/19  4:07 PM   Specimen: Nasopharyngeal Swab  Result Value Ref Range Status   SARS Coronavirus 2 by RT PCR NEGATIVE NEGATIVE Final    Comment: (NOTE) SARS-CoV-2 target nucleic acids are NOT DETECTED. The SARS-CoV-2 RNA is generally detectable in upper respiratoy specimens during the acute phase of infection. The lowest concentration of SARS-CoV-2 viral copies this assay can detect is 131 copies/mL. A negative result does not preclude SARS-Cov-2 infection and should not be used as the sole basis for treatment or other patient management decisions. A negative result may occur with  improper specimen collection/handling, submission of specimen other than nasopharyngeal swab, presence of viral mutation(s) within the areas targeted by this assay, and inadequate number of viral copies (<131 copies/mL). A negative result must be combined with clinical observations, patient history, and epidemiological information. The expected result is Negative. Fact Sheet for Patients:  PinkCheek.be Fact Sheet for Healthcare Providers:  GravelBags.it This test is not yet ap proved or cleared by the [place] FDA and  has been authorized for detection and/or diagnosis of SARS-CoV-2 by FDA under an Emergency Use  Authorization (EUA). This EUA will remain  in effect (meaning this test can be used) for the duration of the COVID-19 declaration under Section 564(b)(1) of the Act, 21 U.S.C. section 360bbb-3(b)(1), unless the authorization is terminated or revoked sooner.    Influenza A by PCR NEGATIVE NEGATIVE Final   Influenza B by PCR NEGATIVE NEGATIVE Final    Comment: (NOTE) The Xpert Xpress SARS-CoV-2/FLU/RSV assay is intended as an aid in  the diagnosis of influenza from Nasopharyngeal swab specimens and  should not be used as a sole basis for treatment. Nasal washings and  aspirates are unacceptable for Xpert Xpress SARS-CoV-2/FLU/RSV  testing. Fact Sheet for Patients: PinkCheek.be Fact Sheet for Healthcare Providers: GravelBags.it This test is not yet approved or cleared by the [place] FDA and  has been authorized for detection and/or diagnosis of SARS-CoV-2 by  FDA under an Emergency Use Authorization (EUA). This EUA will remain  in effect (meaning this test can be used) for the duration of the  Covid-19 declaration under Section 564(b)(1) of the Act, 21  U.S.C. section 360bbb-3(b)(1), unless the authorization is  terminated or revoked. Performed at [facility], [place] [address], [place], [place] [address]       Studies: No results found.  Scheduled Meds: . aspirin EC  81 mg Oral Daily  . atorvastatin  20 mg Oral q1800  . enoxaparin (LOVENOX) injection  40 mg Subcutaneous Q24H  . insulin aspart  0-5 Units Subcutaneous QHS  . insulin aspart  0-9 Units Subcutaneous TID WC  . losartan  50 mg Oral Daily  . polyethylene glycol  17 g Oral Daily  . senna-docusate  2 tablet Oral BID  . sodium chloride flush  3 mL Intravenous Q12H    Continuous Infusions: . lactated ringers 20 mL/hr at 10/18/19 1316     LOS: 2 days     Kayleen Memos, MD [facility] Pager [phone]  If 7PM-7AM, please  contact night-coverage [email or web address] Password [facility] 10/18/2019, 5:21 PM

## 2019-10-18 NOTE — Evaluation (Signed)
Clinical/Bedside Swallow Evaluation Patient Details  Name: STARLETT HOCKERT MRN: [id number] Date of Birth: 1924/01/14  Today's Date: 10/18/2019 Time: SLP Start Time (ACUTE ONLY): [id number] SLP Stop Time (ACUTE ONLY): 1502 SLP Time Calculation (min) (ACUTE ONLY): 17 min  Past Medical History:  Past Medical History:  Diagnosis Date  . Anxiety   . Arthritis   . Cancer ([place])    breast 2015  . Cardiac pacemaker in situ   . Chest pain   . Diabetes mellitus without complication ([place])   . DM type 2 (diabetes mellitus, type 2) ([place])   . Dyslipidemia   . Dyspnea    with exertion  . Family history of adverse reaction to anesthesia    daughter slow to wake up  . Gallstones   . Headache   . Hypertension   . Macular degeneration   . Pulmonary hypertension ([facility])    PA peak pressure 81 mmHg by 09/2016 echo  . Type II or unspecified type diabetes mellitus without mention of complication, not stated as uncontrolled   . Wears partial dentures    Past Surgical History:  Past Surgical History:  Procedure Laterality Date  . BREAST EXCISIONAL BIOPSY Right 2010  . CATARACT EXTRACTION    . CHOLECYSTECTOMY N/A 01/18/2017   Procedure: LAPAROSCOPIC CHOLECYSTECTOMY;  Surgeon: Ralene Ok, MD;  Location: [facility];  Service: General;  Laterality: N/A;  . IR GENERIC HISTORICAL  10/08/2016   IR PERC CHOLECYSTOSTOMY 10/08/2016 Greggory Keen, MD [facility]-INTERV RAD  . IR RADIOLOGIST EVAL & MGMT  11/16/2016  . KNEE SURGERY Bilateral   . MULTIPLE TOOTH EXTRACTIONS    . PACEMAKER INSERTION     [employer] 2003  . PACEMAKER INSERTION    . PPM GENERATOR CHANGEOUT N/A 04/16/2019   Procedure: PPM GENERATOR CHANGEOUT;  Surgeon: Evans Lance, MD;  Location: [facility];  Service: Cardiovascular;  Laterality: N/A;  . TOTAL KNEE ARTHROPLASTY Bilateral    HPI:  86 y.o. female with medical history significant for type 2 diabetes mellitus, complete heart block status post pacemaker, hypertension, who is admitted  to [facility] on 10/16/2019 with acute encephalopathy after presenting from home.  Prior CVA (right thalamus) seen on CT head   Assessment / Plan / Recommendation Clinical Impression  Pt presents with quite functional swallow with adequate oral attention and mastication, brisk swallow response, no s/s of aspiration with successive sips of water, nor with duel solid/liquid consistencies.  Pt was alert, confused, hallucinating re: noises in hall, but quite pleasant and redirectable.  No dysphagia evident.  Continue current diet.  No SLP f/u needed.  SLP Visit Diagnosis: Dysphagia, unspecified (R13.10)    Aspiration Risk  No limitations    Diet Recommendation   soft diet, thin liquids  Medication Administration: Whole meds with liquid    Other  Recommendations Oral Care Recommendations: Oral care BID   Follow up Recommendations None      Frequency and Duration            Prognosis        Swallow Study   General HPI: 86 y.o. female with medical history significant for type 2 diabetes mellitus, complete heart block status post pacemaker, hypertension, who is admitted to [facility] on 10/16/2019 with acute encephalopathy after presenting from home.  Prior CVA (right thalamus) seen on CT head Type of Study: Bedside Swallow Evaluation Previous Swallow Assessment: no Diet Prior to this Study: Regular;Thin liquids Temperature Spikes Noted: No Respiratory Status:  Room air History of Recent Intubation: No Behavior/Cognition: Alert;Cooperative;Confused Oral Cavity Assessment: Within Functional Limits Oral Cavity - Dentition: Missing dentition Vision: Functional for self-feeding Self-Feeding Abilities: Able to feed self Patient Positioning: Upright in bed Baseline Vocal Quality: Normal Volitional Cough: Strong Volitional Swallow: Able to elicit    Oral/Motor/Sensory Function Overall Oral Motor/Sensory Function: Within functional limits   Ice Chips Ice chips: Within  functional limits   Thin Liquid Thin Liquid: Within functional limits    Nectar Thick Nectar Thick Liquid: Not tested   Honey Thick Honey Thick Liquid: Not tested   Puree Puree: Within functional limits   Solid     Solid: Within functional limits      Juan Quam Laurice 10/18/2019,3:25 PM   Estill Bamberg L. Tivis Ringer, [place] Office number [phone]

## 2019-10-18 NOTE — Progress Notes (Signed)
Returned call to pt daughter's Gwenlyn Saran [phone] [facility]) with pt update and current status. SRP, RN

## 2019-10-18 NOTE — Evaluation (Signed)
Physical Therapy Evaluation Patient Details Name: Maria [name] MRN: [id number] DOB: June 06, 1924 Today's Date: 10/18/2019   History of Present Illness  86 y.o. female with medical history significant for type 2 diabetes mellitus, complete heart block status post pacemaker, hypertension, who is admitted to [facility] on 10/16/2019 with acute encephalopathy after presenting from home.  Prior CVA seen on CT head  Clinical Impression  Pt admitted with above diagnosis.  Pt currently with functional limitations due to the deficits listed below (see PT Problem List). Pt will benefit from skilled PT to increase their independence and safety with mobility to allow discharge to the venue listed below.  Pt believed she was at home and therapist was breaking into her house and moving her things despite introduction.  Pt able to reorient briefly however quickly back to thinking she was at home.  Pt able to ambulate short distance however fatigued quickly requesting to sit down.  Pt does not appear safe to d/c home alone at this time.  Recommend SNF if family is not able to provide 24/7 supervision.     Follow Up Recommendations Supervision/Assistance - 24 hour;SNF    Equipment Recommendations  None recommended by PT    Recommendations for Other Services       Precautions / Restrictions Precautions Precautions: Fall      Mobility  Bed Mobility Overal bed mobility: Needs Assistance Bed Mobility: Supine to Sit     Supine to sit: Min assist     General bed mobility comments: slight assist for trunk  Transfers Overall transfer level: Needs assistance Equipment used: Rolling walker (2 wheeled) Transfers: Sit to/from Stand Sit to Stand: Min guard         General transfer comment: min/guard for safety, increased time and effort  Ambulation/Gait Ambulation/Gait assistance: Min guard Gait Distance (Feet): 10 Feet Assistive device: Rolling walker (2 wheeled) Gait Pattern/deviations:  Step-through pattern;Decreased stride length;Trunk flexed     General Gait Details: reports fatigued and requested to sit down, recliner following  Stairs            Wheelchair Mobility    Modified Rankin (Stroke Patients Only)       Balance Overall balance assessment: Needs assistance         Standing balance support: Bilateral upper extremity supported Standing balance-Leahy Scale: Poor                               Pertinent Vitals/Pain Pain Assessment: No/denies pain    Home Living Family/patient expects to be discharged to:: Private residence Living Arrangements: Alone   Type of Home: House           Additional Comments: pt confused, uncertain of baseline cognition, from chart pt from home alone    Prior Function Level of Independence: Independent with assistive device(s)         Comments: SPC, RW     Hand Dominance        Extremity/Trunk Assessment        Lower Extremity Assessment Lower Extremity Assessment: Generalized weakness    Cervical / Trunk Assessment Cervical / Trunk Assessment: Kyphotic  Communication   Communication: No difficulties  Cognition Arousal/Alertness: Awake/alert   Overall Cognitive Status: No family/caregiver present to determine baseline cognitive functioning  General Comments: admitted with AMS, hx dementia - pt not orientated to place and having difficulty reorientating (keeps thinking she is at home, therapy moving her things around in the house)      General Comments      Exercises     Assessment/Plan    PT Assessment Patient needs continued PT services  PT Problem List Decreased strength;Decreased mobility;Decreased balance;Decreased activity tolerance;Decreased knowledge of use of DME;Decreased cognition       PT Treatment Interventions DME instruction;Therapeutic exercise;Gait training;Balance training;Stair training;Functional  mobility training;Therapeutic activities;Patient/family education    PT Goals (Current goals can be found in the Care Plan section)  Acute Rehab PT Goals PT Goal Formulation: Patient unable to participate in goal setting Time For Goal Achievement: 11/01/19 Potential to Achieve Goals: Fair    Frequency Min 2X/week   Barriers to discharge        Co-evaluation               AM-PAC PT "6 Clicks" Mobility  Outcome Measure Help needed turning from your back to your side while in a flat bed without using bedrails?: A Little Help needed moving from lying on your back to sitting on the side of a flat bed without using bedrails?: A Little Help needed moving to and from a bed to a chair (including a wheelchair)?: A Little Help needed standing up from a chair using your arms (e.g., wheelchair or bedside chair)?: A Little Help needed to walk in hospital room?: A Little Help needed climbing 3-5 steps with a railing? : A Lot 6 Click Score: 17    End of Session Equipment Utilized During Treatment: Gait belt Activity Tolerance: Patient tolerated treatment well Patient left: in chair;with chair alarm set;with call bell/phone within reach Nurse Communication: Mobility status PT Visit Diagnosis: Difficulty in walking, not elsewhere classified (R26.2);Muscle weakness (generalized) (M62.81)    Time: [id number] PT Time Calculation (min) (ACUTE ONLY): 15 min   Charges:   PT Evaluation $PT Eval Low Complexity: 1 Low        Kati PT, DPT Acute Rehabilitation Services Office: [phone]   York Ram E 10/18/2019, 1:41 PM

## 2019-10-19 DIAGNOSIS — I469 Cardiac arrest, cause unspecified: Secondary | ICD-10-CM | POA: Diagnosis not present

## 2019-10-19 DIAGNOSIS — S069X9A Unspecified intracranial injury with loss of consciousness of unspecified duration, initial encounter: Secondary | ICD-10-CM | POA: Diagnosis not present

## 2019-10-19 DIAGNOSIS — F17220 Nicotine dependence, chewing tobacco, uncomplicated: Secondary | ICD-10-CM | POA: Diagnosis not present

## 2019-10-19 DIAGNOSIS — F17290 Nicotine dependence, other tobacco product, uncomplicated: Secondary | ICD-10-CM | POA: Diagnosis not present

## 2019-10-19 DIAGNOSIS — R404 Transient alteration of awareness: Secondary | ICD-10-CM | POA: Diagnosis not present

## 2019-10-19 DIAGNOSIS — D259 Leiomyoma of uterus, unspecified: Secondary | ICD-10-CM | POA: Diagnosis not present

## 2019-10-19 DIAGNOSIS — G9340 Encephalopathy, unspecified: Secondary | ICD-10-CM | POA: Diagnosis not present

## 2019-10-19 DIAGNOSIS — I8393 Asymptomatic varicose veins of bilateral lower extremities: Secondary | ICD-10-CM | POA: Diagnosis not present

## 2019-10-19 DIAGNOSIS — R4189 Other symptoms and signs involving cognitive functions and awareness: Secondary | ICD-10-CM | POA: Diagnosis not present

## 2019-10-19 DIAGNOSIS — R4020 Unspecified coma: Secondary | ICD-10-CM | POA: Diagnosis not present

## 2019-10-19 DIAGNOSIS — I6389 Other cerebral infarction: Secondary | ICD-10-CM | POA: Diagnosis not present

## 2019-10-19 DIAGNOSIS — E860 Dehydration: Secondary | ICD-10-CM | POA: Diagnosis not present

## 2019-10-19 DIAGNOSIS — G9341 Metabolic encephalopathy: Secondary | ICD-10-CM | POA: Diagnosis not present

## 2019-10-19 DIAGNOSIS — I639 Cerebral infarction, unspecified: Secondary | ICD-10-CM | POA: Diagnosis not present

## 2019-10-19 DIAGNOSIS — R2689 Other abnormalities of gait and mobility: Secondary | ICD-10-CM | POA: Diagnosis not present

## 2019-10-19 DIAGNOSIS — E440 Moderate protein-calorie malnutrition: Secondary | ICD-10-CM | POA: Diagnosis not present

## 2019-10-19 DIAGNOSIS — Z950 Presence of cardiac pacemaker: Secondary | ICD-10-CM | POA: Diagnosis not present

## 2019-10-19 DIAGNOSIS — E119 Type 2 diabetes mellitus without complications: Secondary | ICD-10-CM | POA: Diagnosis not present

## 2019-10-19 DIAGNOSIS — I6789 Other cerebrovascular disease: Secondary | ICD-10-CM | POA: Diagnosis not present

## 2019-10-19 DIAGNOSIS — U071 COVID-19: Secondary | ICD-10-CM | POA: Diagnosis not present

## 2019-10-19 DIAGNOSIS — M6281 Muscle weakness (generalized): Secondary | ICD-10-CM | POA: Diagnosis not present

## 2019-10-19 DIAGNOSIS — Z79899 Other long term (current) drug therapy: Secondary | ICD-10-CM | POA: Diagnosis not present

## 2019-10-19 DIAGNOSIS — K5901 Slow transit constipation: Secondary | ICD-10-CM | POA: Diagnosis not present

## 2019-10-19 DIAGNOSIS — R2681 Unsteadiness on feet: Secondary | ICD-10-CM | POA: Diagnosis not present

## 2019-10-19 DIAGNOSIS — R079 Chest pain, unspecified: Secondary | ICD-10-CM | POA: Diagnosis not present

## 2019-10-19 DIAGNOSIS — E1121 Type 2 diabetes mellitus with diabetic nephropathy: Secondary | ICD-10-CM | POA: Diagnosis not present

## 2019-10-19 DIAGNOSIS — E118 Type 2 diabetes mellitus with unspecified complications: Secondary | ICD-10-CM | POA: Diagnosis not present

## 2019-10-19 DIAGNOSIS — W1789XA Other fall from one level to another, initial encounter: Secondary | ICD-10-CM | POA: Diagnosis not present

## 2019-10-19 DIAGNOSIS — N390 Urinary tract infection, site not specified: Secondary | ICD-10-CM | POA: Diagnosis not present

## 2019-10-19 DIAGNOSIS — Z20822 Contact with and (suspected) exposure to covid-19: Secondary | ICD-10-CM | POA: Diagnosis not present

## 2019-10-19 DIAGNOSIS — Z7401 Bed confinement status: Secondary | ICD-10-CM | POA: Diagnosis not present

## 2019-10-19 DIAGNOSIS — R000 Tachycardia, unspecified: Secondary | ICD-10-CM | POA: Diagnosis not present

## 2019-10-19 DIAGNOSIS — R410 Disorientation, unspecified: Secondary | ICD-10-CM | POA: Diagnosis not present

## 2019-10-19 DIAGNOSIS — I272 Pulmonary hypertension, unspecified: Secondary | ICD-10-CM | POA: Diagnosis not present

## 2019-10-19 DIAGNOSIS — Z7984 Long term (current) use of oral hypoglycemic drugs: Secondary | ICD-10-CM | POA: Diagnosis not present

## 2019-10-19 DIAGNOSIS — R4702 Dysphasia: Secondary | ICD-10-CM | POA: Diagnosis not present

## 2019-10-19 DIAGNOSIS — R4182 Altered mental status, unspecified: Secondary | ICD-10-CM | POA: Diagnosis not present

## 2019-10-19 DIAGNOSIS — E876 Hypokalemia: Secondary | ICD-10-CM | POA: Diagnosis not present

## 2019-10-19 DIAGNOSIS — D649 Anemia, unspecified: Secondary | ICD-10-CM | POA: Diagnosis not present

## 2019-10-19 DIAGNOSIS — I959 Hypotension, unspecified: Secondary | ICD-10-CM | POA: Diagnosis not present

## 2019-10-19 DIAGNOSIS — Z7982 Long term (current) use of aspirin: Secondary | ICD-10-CM | POA: Diagnosis not present

## 2019-10-19 DIAGNOSIS — M48061 Spinal stenosis, lumbar region without neurogenic claudication: Secondary | ICD-10-CM | POA: Diagnosis not present

## 2019-10-19 DIAGNOSIS — M6259 Muscle wasting and atrophy, not elsewhere classified, multiple sites: Secondary | ICD-10-CM | POA: Diagnosis not present

## 2019-10-19 DIAGNOSIS — M2550 Pain in unspecified joint: Secondary | ICD-10-CM | POA: Diagnosis not present

## 2019-10-19 DIAGNOSIS — R278 Other lack of coordination: Secondary | ICD-10-CM | POA: Diagnosis not present

## 2019-10-19 DIAGNOSIS — S199XXA Unspecified injury of neck, initial encounter: Secondary | ICD-10-CM | POA: Diagnosis not present

## 2019-10-19 DIAGNOSIS — R52 Pain, unspecified: Secondary | ICD-10-CM | POA: Diagnosis not present

## 2019-10-19 DIAGNOSIS — R6889 Other general symptoms and signs: Secondary | ICD-10-CM | POA: Diagnosis not present

## 2019-10-19 DIAGNOSIS — R262 Difficulty in walking, not elsewhere classified: Secondary | ICD-10-CM | POA: Diagnosis not present

## 2019-10-19 DIAGNOSIS — I2729 Other secondary pulmonary hypertension: Secondary | ICD-10-CM | POA: Diagnosis not present

## 2019-10-19 DIAGNOSIS — R1312 Dysphagia, oropharyngeal phase: Secondary | ICD-10-CM | POA: Diagnosis not present

## 2019-10-19 DIAGNOSIS — I10 Essential (primary) hypertension: Secondary | ICD-10-CM | POA: Diagnosis not present

## 2019-10-19 DIAGNOSIS — E1142 Type 2 diabetes mellitus with diabetic polyneuropathy: Secondary | ICD-10-CM | POA: Diagnosis not present

## 2019-10-19 DIAGNOSIS — Z853 Personal history of malignant neoplasm of breast: Secondary | ICD-10-CM | POA: Diagnosis not present

## 2019-10-19 DIAGNOSIS — R41841 Cognitive communication deficit: Secondary | ICD-10-CM | POA: Diagnosis not present

## 2019-10-19 LAB — COMPREHENSIVE METABOLIC PANEL
ALT: 25 U/L (ref 0–44)
AST: 24 U/L (ref 15–41)
Albumin: 4.6 g/dL (ref 3.5–5.0)
Alkaline Phosphatase: 46 U/L (ref 38–126)
Anion gap: 17 — ABNORMAL HIGH (ref 5–15)
BUN: 9 mg/dL (ref 8–23)
CO2: 22 mmol/L (ref 22–32)
Calcium: 9.7 mg/dL (ref 8.9–10.3)
Chloride: 102 mmol/L (ref 98–111)
Creatinine, Ser: 0.65 mg/dL (ref 0.44–1.00)
GFR calc Af Amer: >60 mL/min (ref >60)
GFR calc non Af Amer: >60 mL/min (ref >60)
Glucose, Bld: 187 mg/dL — ABNORMAL HIGH (ref 70–99)
Potassium: 3.7 mmol/L (ref 3.5–5.1)
Sodium: 141 mmol/L (ref 135–145)
Total Bilirubin: 1.6 mg/dL — ABNORMAL HIGH (ref 0.3–1.2)
Total Protein: 8.1 g/dL (ref 6.5–8.1)

## 2019-10-19 LAB — CBC
HCT: 46.5 % — ABNORMAL HIGH (ref 36.0–46.0)
Hemoglobin: 14.5 g/dL (ref 12.0–15.0)
MCH: 27.8 pg (ref 26.0–34.0)
MCHC: 31.2 g/dL (ref 30.0–36.0)
MCV: 89.3 fL (ref 80.0–100.0)
Platelets: 214 10*3/uL (ref 150–400)
RBC: 5.21 MIL/uL — ABNORMAL HIGH (ref 3.87–5.11)
RDW: 13.6 % (ref 11.5–15.5)
WBC: 7.1 10*3/uL (ref 4.0–10.5)
nRBC: 0 % (ref 0.0–0.2)

## 2019-10-19 LAB — GLUCOSE, CAPILLARY
Glucose-Capillary: 156 mg/dL — ABNORMAL HIGH (ref 70–99)
Glucose-Capillary: 133 mg/dL — ABNORMAL HIGH (ref 70–99)
Glucose-Capillary: 139 mg/dL — ABNORMAL HIGH (ref 70–99)
Glucose-Capillary: 188 mg/dL — ABNORMAL HIGH (ref 70–99)

## 2019-10-19 MED ORDER — HALOPERIDOL LACTATE 5 MG/ML IJ SOLN
2.5000 mg | Freq: Once | INTRAMUSCULAR | Status: AC
  Administered 2019-10-19: 2.5 mg via INTRAVENOUS
  Filled 2019-10-19: qty 1

## 2019-10-19 MED ORDER — LIP MEDEX EX OINT: TOPICAL_OINTMENT | CUTANEOUS | Status: DC | PRN

## 2019-10-19 MED ORDER — ATORVASTATIN CALCIUM 40 MG PO TABS: 40.0000 mg | ORAL_TABLET | Freq: Every day | ORAL | 0 refills | Status: DC

## 2019-10-19 MED ORDER — AMLODIPINE BESYLATE 5 MG PO TABS
5.0000 mg | ORAL_TABLET | Freq: Once | ORAL | Status: AC
  Administered 2019-10-19: 14:00:00 5 mg via ORAL
  Filled 2019-10-19: qty 1

## 2019-10-19 MED ORDER — AMLODIPINE BESYLATE 10 MG PO TABS: 10.0000 mg | ORAL_TABLET | Freq: Every day | ORAL | 1 refills | Status: DC

## 2019-10-19 MED ORDER — ASPIRIN 81 MG PO TBEC: 81.0000 mg | DELAYED_RELEASE_TABLET | Freq: Every day | ORAL | 0 refills | Status: AC

## 2019-10-19 MED ORDER — AMLODIPINE BESYLATE 5 MG PO TABS
5.0000 mg | ORAL_TABLET | Freq: Every day | ORAL | Status: DC
  Administered 2019-10-19: 5 mg via ORAL
  Filled 2019-10-19: qty 1

## 2019-10-19 MED ORDER — AMLODIPINE BESYLATE 10 MG PO TABS: 10.0000 mg | ORAL_TABLET | Freq: Every day | ORAL | Status: DC

## 2019-10-19 NOTE — Plan of Care (Signed)
  Problem: Education: Goal: Knowledge of General Education information will improve Description Including pain rating scale, medication(s)/side effects and non-pharmacologic comfort measures Outcome: Progressing   Problem: Health Behavior/Discharge Planning: Goal: Ability to manage health-related needs will improve Outcome: Progressing   

## 2019-10-19 NOTE — Progress Notes (Signed)
Maria [name] discharge to [facility], EMS here for transport. Called daughter, [name] per request to inform her that her mother, Maria [name] is in route to [employer] [facility].

## 2019-10-19 NOTE — TOC Progression Note (Signed)
Transition of Care [facility]) - Progression Note    Patient Details  Name: Maria [name] MRN: [id number] Date of Birth: 06-Oct-1923  Transition of Care [facility]) CM/SW Contact  Ross Ludwig, [place] Phone Number: 10/19/2019, 1:41 PM  Clinical Narrative:     CSW spoke to patient's daughter and provided bed offers, she chose [facility] for short term rehab.  CSW spoke to [facility] and they can accept patient today.  CSW notified patient's daughter and physician.   Expected Discharge Plan: [place] Barriers to Discharge: Continued Medical Work up  Expected Discharge Plan and Services Expected Discharge Plan: [place]         Expected Discharge Date: 10/19/19                                     Social Determinants of Health (SDOH) Interventions    Readmission Risk Interventions No flowsheet data found.

## 2019-10-19 NOTE — Progress Notes (Signed)
Report called to [employer], Therapist, sports from [employer]. Continue to wait for [facility] to transport pt. SRP, RN

## 2019-10-19 NOTE — Progress Notes (Addendum)
Pt trying to crawl OOB and removing her tele leads. On call notified for tele sitter or Air cabin crew.  NO for 25 mg Benadryl. Pt given Benadryl. Pt quiet for 2-3 hrs. Then began to try to get up and pulling tele leads off. Mittens applied, however pt was able to pull them off. On call contacted and a NO for 2.5 mg Haldol was ordered. (Given @ 0327) Pt still trying to crawl OOB. Pt put on BSC and had a BM and good urine OP. Pt finally fell asleep about 0530 am.

## 2019-10-19 NOTE — Discharge Summary (Signed)
Discharge Summary  Maria [name]:[id number] DOB: 1923-10-30  PCP: Audley Hose, MD  Admit date: 10/16/2019 Discharge date: 10/19/2019  Time spent: 35 minutes.  Recommendations for Outpatient Follow-up:  1. Follow-up with your primary care provider. 2. Follow-up with your cardiologist. 3. Follow-up with neurology.  Discharge Diagnoses:  Active Hospital Problems   Diagnosis Date Noted  . Acute encephalopathy 10/16/2019  . Elevated lactic acid level 10/17/2019  . Generalized weakness 10/17/2019  . Hypokalemia 10/17/2019  . Dehydration 10/17/2019  . HTN (hypertension) 05/20/2013    Resolved Hospital Problems  No resolved problems to display.    Discharge Condition: Stable  Diet recommendation: Soft diet and thin liquids.  Vitals:   10/19/19 0508 10/19/19 0848  BP: (!) 188/117 (!) 174/91  Pulse: 87 78  Resp: (!) 26   Temp:    SpO2: 95%     History of present illness:   Maria [name] a 86 y.o.femalewith medical history significant fortype 2 diabetes mellitus, complete heart block status post pacemaker, hypertension,who is admitted to [facility] on 2/2/2021with acute encephalopathyafter presenting from home.  Poor oral intake, minimal interaction, refusing to ambulate, and not wanting to get out of bed.  At baseline ambulates with a walker.  UA and CXR unrevealing.  CT head shows small chronic right thalamic infarct.  Spoke with daughter [name] not aware of prior hx of CVA.  Unable to obtain MRI due to incompatibility with her pacemaker.  10/19/19: Seen and examined.  No acute events overnight.  She has no new complaints.  She is pleasantly alert and oriented x1.  Hospital Course:  Principal Problem:   Acute encephalopathy Active Problems:   HTN (hypertension)   Elevated lactic acid level   Generalized weakness   Hypokalemia   Dehydration  Improving Acute metabolic encephalopathy in the setting of suspected prior CVA MRI brain ordered to  confirm old CVA.  Pacemaker not compatible with MRI therefore this was canceled. UA and CXR unrevealing Continue to reorient. Continue fall precautions.  Prior CVA seen on CT head Admission CT head shows small chronic right thalamic infarct.  Spoke with daughter [name] not aware of prior hx of CVA. Management per above LDL 178 on 2/3/221; goal <70 A1C 6.5 (10/17/19) Patient was not on antiplatelet or statin. Continue aspirin 81 mg daily and Lipitor 40 mg daily, started in the hospital. Follow up with neurology in 1-2 weeks  Dysphagia Per her daughter on soft diet at home Speech therapy eval and recommended soft and thin liquids Continue aspirations precautions  Dehydration Hypovolemic on exam Received IV fluid Encourage oral intake as tolerated   Resolved post replacement: Hypokalemia K+ 3.4>> 3.3>> 3.7 Losartan would help hypokalemia  Hypomagnesemia Mg2+ 1.6>> 1.7 Replaced w IV mag 2g once  Isolated hyperbilirubenemia Tbili 1.7>> 1.8>> 1.6 Trending down AST ALT Alk ph normal Follow up with PCP  Ambulatory dysfunction/physical debility PT recommended SNF and 24 hour supervision Fall precautions  Essential HTN Continue po home meds  Type 2 diabetes, well controlled A1C 6.5 Resume home metformin    Code Status:Full    Consults called:None    Discharge Exam: BP (!) 174/91   Pulse 78   Temp 98 F (36.7 C) (Oral)   Resp (!) 26   Ht '5\' 4"'  (1.626 m)   Wt 63.4 kg   SpO2 95%   BMI 23.99 kg/m  . General: 86 y.o. year-old female Pleasant, well developed well nourished in no acute distress.  Alert and oriented  x1. . Cardiovascular: Regular rate and rhythm with no rubs or gallops.  No thyromegaly or JVD noted.   Marland Kitchen Respiratory: Clear to auscultation with no wheezes or rales. Good inspiratory effort. . Abdomen: Soft nontender nondistended with normal bowel sounds x4 quadrants. . Musculoskeletal: No lower extremity edema. Marland Kitchen Psychiatry: Mood is  appropriate for condition and setting  Discharge Instructions You were cared for by a hospitalist during your hospital stay. If you have any questions about your discharge medications or the care you received while you were in the hospital after you are discharged, you can call the unit and asked to speak with the hospitalist on call if the hospitalist that took care of you is not available. Once you are discharged, your primary care physician will handle any further medical issues. Please note that NO REFILLS for any discharge medications will be authorized once you are discharged, as it is imperative that you return to your primary care physician (or establish a relationship with a primary care physician if you do not have one) for your aftercare needs so that they can reassess your need for medications and monitor your lab values.   Allergies as of 10/19/2019   No Known Allergies     Medication List    STOP taking these medications   acetaminophen 500 MG tablet Commonly known as: TYLENOL   cephALEXin 500 MG capsule Commonly known as: Keflex   hydrochlorothiazide 12.5 MG tablet Commonly known as: HYDRODIURIL   HYDROcodone-acetaminophen 5-325 MG tablet Commonly known as: NORCO/VICODIN     TAKE these medications   amLODipine 10 MG tablet Commonly known as: NORVASC Take 1 tablet (10 mg total) by mouth daily. Start taking on: October 20, 2019   ASPERCREME EX Apply 1 application topically daily as needed (muscle aches).   aspirin 81 MG EC tablet Take 1 tablet (81 mg total) by mouth daily. Start taking on: October 20, 2019   atorvastatin 40 MG tablet Commonly known as: LIPITOR Take 1 tablet (40 mg total) by mouth daily at 6 PM.   Doxepin HCl 5 % Crea Apply 0.5-1 g topically 4 (four) times daily.   freestyle lancets   FreeStyle Lite Devi   FREESTYLE LITE test strip Generic drug: glucose blood   lidocaine-prilocaine cream Commonly known as: EMLA Apply 1-2 g topically 4  (four) times daily.   losartan 50 MG tablet Commonly known as: COZAAR Take 50 mg by mouth daily.   meclizine 25 MG tablet Commonly known as: ANTIVERT Take 25 mg by mouth 3 (three) times daily as needed for dizziness.   metFORMIN 1000 MG tablet Commonly known as: GLUCOPHAGE Take 1,000 mg by mouth daily with breakfast.   ondansetron 4 MG disintegrating tablet Commonly known as: Zofran ODT Take 1 tablet (4 mg total) by mouth every 8 (eight) hours as needed for nausea or vomiting.   PRESERVISION AREDS 2 PO Take 1 tablet by mouth daily.   REFRESH OP Apply 1 drop to eye daily.   SALONPAS PAIN RELIEF PATCH EX Place 1 patch onto the skin daily as needed (back pain.).   valACYclovir 1000 MG tablet Commonly known as: VALTREX Take 1 g by mouth daily as needed (cold sore/fever blisters).      No Known Allergies Follow-up Information    Bakare, Mobolaji B, MD. Call in 1 day(s).   Specialty: Internal Medicine Why: Please call for a post hospital follow-up appointment. Contact information: [address] [place] [address] [phone]  Evans Lance, MD .   Specialty: Cardiology Contact information: [id number] [place] [address] [phone]        [facility] Call in 1 day(s).   Why: Please call for a post hospital follow-up appointment. Contact information: [address]     [place] [address] [phone]           The results of significant diagnostics from this hospitalization (including imaging, microbiology, ancillary and laboratory) are listed below for reference.    Significant Diagnostic Studies: CT Head Wo Contrast  Result Date: 10/16/2019 CLINICAL DATA:  Altered mental status. EXAM: CT HEAD WITHOUT CONTRAST TECHNIQUE: Contiguous axial images were obtained from the base of the skull through the vertex without intravenous contrast. COMPARISON:  April 24, 2018 FINDINGS: Brain: There is mild cerebral atrophy with widening of the extra-axial spaces and ventricular dilatation. There are areas of decreased attenuation within the white matter tracts of the supratentorial brain, consistent with microvascular disease changes. A small chronic right thalamic infarct is seen. Vascular: No hyperdense vessel or unexpected calcification. Skull: Normal. Negative for fracture or focal lesion. Sinuses/Orbits: No acute finding. Other: None. IMPRESSION: No acute intracranial pathology. Electronically Signed   By: Virgina Norfolk M.D.   On: 10/16/2019 15:39   DG Chest Port 1 View  Result Date: 10/16/2019 CLINICAL DATA:  Altered level of consciousness, frequent urination, confusion EXAM: PORTABLE CHEST 1 VIEW COMPARISON:  07/19/2019 FINDINGS: Single frontal view of the chest demonstrates stable pacemaker. Cardiac silhouette is enlarged but stable. Chronic elevation the right hemidiaphragm. No airspace disease, effusion, or pneumothorax. Chronic degenerative changes of the bilateral shoulders. IMPRESSION: 1. Stable exam, no acute process. Electronically Signed   By: Randa Ngo M.D.   On: 10/16/2019 14:08   CUP PACEART REMOTE DEVICE CHECK  Result Date: 10/16/2019 Scheduled remote reviewed. Normal device function.  1 PAF, 3 min. 3 NSVT episodes, longest 9 beats Next remote 91 days- JBox, RN/CVRS   Microbiology: Recent Results (from the past 240 hour(s))  Urine Culture     Status: None   Collection Time: 10/16/19  1:52 PM   Specimen: Urine, Clean Catch  Result Value Ref Range Status   Specimen Description   Final    URINE, CLEAN CATCH Performed at [facility], [place] [address] [place], [place] [address]    Special Requests   Final    NONE Performed at [facility], [place] [address], [place],  [address]    Culture   Final    NO GROWTH Performed at [facility], [place] [address], [place],  [address]     Report Status 10/17/2019 FINAL  Final  Respiratory Panel by RT PCR (Flu A&B, Covid) - Nasopharyngeal Swab     Status: None   Collection Time: 10/16/19  4:07 PM   Specimen: Nasopharyngeal Swab  Result Value Ref Range Status   SARS Coronavirus 2 by RT PCR NEGATIVE NEGATIVE Final    Comment: (NOTE) SARS-CoV-2 target nucleic acids are NOT DETECTED. The SARS-CoV-2 RNA is generally detectable in upper respiratoy specimens during the acute phase of infection. The lowest concentration of SARS-CoV-2 viral copies this assay can detect is 131 copies/mL. A negative result does not preclude SARS-Cov-2 infection and should not be used as the sole basis for treatment or other patient management decisions. A negative result may occur with  improper specimen collection/handling, submission of specimen other than nasopharyngeal swab, presence of  viral mutation(s) within the areas targeted by this assay, and inadequate number of viral copies (<131 copies/mL). A negative result must be combined with clinical observations, patient history, and epidemiological information. The expected result is Negative. Fact Sheet for Patients:  PinkCheek.be Fact Sheet for Healthcare Providers:  GravelBags.it This test is not yet ap proved or cleared by the [place] FDA and  has been authorized for detection and/or diagnosis of SARS-CoV-2 by FDA under an Emergency Use Authorization (EUA). This EUA will remain  in effect (meaning this test can be used) for the duration of the COVID-19 declaration under Section 564(b)(1) of the Act, 21 U.S.C. section 360bbb-3(b)(1), unless the authorization is terminated or revoked sooner.    Influenza A by PCR NEGATIVE NEGATIVE Final   Influenza B by PCR NEGATIVE NEGATIVE Final    Comment: (NOTE) The Xpert Xpress SARS-CoV-2/FLU/RSV assay is intended as an aid in  the diagnosis of influenza from Nasopharyngeal swab specimens  and  should not be used as a sole basis for treatment. Nasal washings and  aspirates are unacceptable for Xpert Xpress SARS-CoV-2/FLU/RSV  testing. Fact Sheet for Patients: PinkCheek.be Fact Sheet for Healthcare Providers: GravelBags.it This test is not yet approved or cleared by the [place] FDA and  has been authorized for detection and/or diagnosis of SARS-CoV-2 by  FDA under an Emergency Use Authorization (EUA). This EUA will remain  in effect (meaning this test can be used) for the duration of the  Covid-19 declaration under Section 564(b)(1) of the Act, 21  U.S.C. section 360bbb-3(b)(1), unless the authorization is  terminated or revoked. Performed at [facility], [place] [address], [place], [place] [address]      Labs: Basic Metabolic Panel: Recent Labs  Lab 10/16/19 1352 10/17/19 0354 10/18/19 0618 10/19/19 0254  NA 141 139 138 141  K 3.3* 3.4* 3.3* 3.7  CL 103 104 101 102  CO2 24 19* 25 22  GLUCOSE 173* 175* 172* 187*  BUN '12 13 13 9  ' CREATININE 0.59 0.61 0.53 0.65  CALCIUM 9.5 9.1 8.9 9.7  MG 1.5* 1.6* 1.7  --   PHOS  --   --  3.2  --    Liver Function Tests: Recent Labs  Lab 10/16/19 1352 10/17/19 0354 10/18/19 0618 10/19/19 0254  AST '19 26 25 24  ' ALT '19 22 23 25  ' ALKPHOS 44 41 35* 46  BILITOT 1.6* 1.7* 1.8* 1.6*  PROT 7.9 7.4 6.8 8.1  ALBUMIN 4.3 3.9 3.7 4.6   No results for input(s): LIPASE, AMYLASE in the last 168 hours. Recent Labs  Lab 10/16/19 1352  AMMONIA 20   CBC: Recent Labs  Lab 10/16/19 1352 10/17/19 0354 10/18/19 0618 10/19/19 0254  WBC 7.2 6.6 6.9 7.1  NEUTROABS 4.0  --   --   --   HGB 15.3* 14.5 13.2 14.5  HCT 46.9* 45.7 40.4 46.5*  MCV 87.8 89.4 86.5 89.3  PLT 206 206 195 214   Cardiac Enzymes: No results for input(s): CKTOTAL, CKMB, CKMBINDEX, TROPONINI in the last 168 hours. BNP: BNP (last 3 results) No results for input(s): BNP in  the last 8760 hours.  ProBNP (last 3 results) No results for input(s): PROBNP in the last 8760 hours.  CBG: Recent Labs  Lab 10/18/19 1144 10/18/19 1652 10/18/19 2045 10/19/19 0755 10/19/19 1151  GLUCAP 282* 94 148* 156* 133*       Signed:  Kayleen Memos, MD [facility] 10/19/2019, 2:06 PM

## 2019-10-19 NOTE — TOC Transition Note (Addendum)
Transition of Care [facility]) - CM/SW Discharge Note   Patient Details  Name: Maria [name] MRN: [id number] Date of Birth: 08-Jan-1924  [facility] CM/SW Contact:  Ross Ludwig, LCSW Phone Number: 10/19/2019, 2:01 PM   Clinical Narrative:     Patient to be d/c'ed today to [facility] room 1204P. Patient and family agreeable to plans will transport via ems RN to call report [phone].  Patient's daughter Maria [name] is aware that patient is discharging today once discharge summary is completed.   Final next level of care: Skilled Nursing Facility Barriers to Discharge: Barriers Resolved   Patient Goals and CMS Choice Patient states their goals for this hospitalization and ongoing recovery are:: To go to SNF for short term rehab, then return back home. CMS Medicare.gov Compare Post Acute Care list provided to:: Patient Represenative (must comment) Choice offered to / list presented to : Adult Children  Discharge Placement   Existing PASRR number confirmed : 10/18/19          Patient chooses bed at: [facility] Patient to be transferred to facility by: [place] Name of family member notified: Daughter Maria [name] Patient and family notified of of transfer: 10/19/19  Discharge Plan and Services                DME Arranged: N/A DME Agency: NA       HH Arranged: NA          Social Determinants of Health (SDOH) Interventions     Readmission Risk Interventions No flowsheet data found.

## 2019-10-19 NOTE — Progress Notes (Signed)
Pt Yellow MEWS, no acute changes, pt resting without distress. Bath given. SRP,RN

## 2019-10-19 NOTE — NC FL2 (Signed)
[place] LEVEL OF CARE SCREENING TOOL     IDENTIFICATION  Patient Name: Maria [name] Birthdate: 04/06/1924 Sex: female Admission Date (Current Location): 10/16/2019  [facility] [place] Number:  Herbalist and Address:  [facility],  [place], [place]      Provider Number: [id number]  Attending Physician Name and Address:  Kayleen Memos, DO  Relative Name and Phone Number:  Dearman,[name] Daughter [phone]  [phone]    Current Level of Care: Hospital Recommended Level of Care: [place] Prior Approval Number:    Date Approved/Denied:   PASRR Number: [id number]  Discharge Plan: SNF    Current Diagnoses: Patient Active Problem List   Diagnosis Date Noted  . Elevated lactic acid level 10/17/2019  . Generalized weakness 10/17/2019  . Hypokalemia 10/17/2019  . Dehydration 10/17/2019  . Acute encephalopathy 10/16/2019  . Heart block AV complete ([place]) 04/13/2019  . Acute acalculous cholecystitis 10/07/2016  . Elevated troponin 10/07/2016  . Lactic acidosis 10/07/2016  . Sinus tachycardia 10/07/2016  . Dyslipidemia 06/05/2013  . Cardiac pacemaker 05/30/2013  . DM type 2 (diabetes mellitus, type 2) ([place]) 05/21/2013  . Chest pain 05/20/2013  . HTN (hypertension) 05/20/2013    Orientation RESPIRATION BLADDER Height & Weight     Self, Place  Normal Continent Weight: 139 lb 12.4 oz (63.4 kg) Height:  5\' 4"  (162.6 cm)  BEHAVIORAL SYMPTOMS/MOOD NEUROLOGICAL BOWEL NUTRITION STATUS      Continent Diet(Soft diet)  AMBULATORY STATUS COMMUNICATION OF NEEDS Skin   Limited Assist Verbally Normal                       Personal Care Assistance Level of Assistance  Bathing, Feeding, Dressing Bathing Assistance: Limited assistance Feeding assistance: Independent Dressing Assistance: Limited assistance     Functional Limitations Info  Sight, Speech, Hearing Sight Info: Adequate Hearing Info:  Adequate Speech Info: Adequate    SPECIAL CARE FACTORS FREQUENCY  PT (By licensed PT), OT (By licensed OT)     PT Frequency: Minimum 5x a week OT Frequency: Minimum 5x a week            Contractures Contractures Info: Not present    Additional Factors Info  Code Status, Allergies, Insulin Sliding Scale Code Status Info: Full Code Allergies Info: NKA   Insulin Sliding Scale Info: insulin aspart (novoLOG) injection 0-9 Units 3x a day with meals       Current Medications (10/19/2019):  This is the current hospital active medication list Current Facility-Administered Medications  Medication Dose Route Frequency Provider Last Rate Last Admin  . amLODipine (NORVASC) tablet 5 mg  5 mg Oral Daily [place] Pap N, DO   5 mg at 10/19/19 0851  . aspirin EC tablet 81 mg  81 mg Oral Daily [place] Pap N, DO   81 mg at 10/19/19 [id number]  . atorvastatin (LIPITOR) tablet 40 mg  40 mg Oral q1800 [place] Pap N, DO   40 mg at 10/18/19 1737  . enoxaparin (LOVENOX) injection 40 mg  40 mg Subcutaneous Q24H Howerter, Justin B, DO   40 mg at 10/19/19 0853  . guaiFENesin-dextromethorphan (ROBITUSSIN DM) 100-10 MG/5ML syrup 5 mL  5 mL Oral Q4H PRN Howerter, Justin B, DO      . insulin aspart (novoLOG) injection 0-5 Units  0-5 Units Subcutaneous QHS Hall, Carole N, DO      . insulin aspart (novoLOG) injection 0-9 Units  0-9  Units Subcutaneous TID WC [place] Pap N, DO   2 Units at 10/19/19 [id number]  . labetalol (NORMODYNE) injection 10 mg  10 mg Intravenous Q2H PRN Howerter, Justin B, DO   10 mg at 10/18/19 2054  . lactated ringers infusion   Intravenous Continuous Kayleen Memos, DO   Stopped at 10/19/19 [id number]  . lip balm (CARMEX) ointment   Topical PRN [place] Pap N, DO      . losartan (COZAAR) tablet 50 mg  50 mg Oral Daily [place] Pap N, DO   50 mg at 10/19/19 0858  . polyethylene glycol (MIRALAX / GLYCOLAX) packet 17 g  17 g Oral Daily [place] Pap N, DO   17 g at 10/19/19 0851  . senna-docusate  (Senokot-S) tablet 2 tablet  2 tablet Oral BID [place] Pap N, DO   2 tablet at 10/19/19 [id number]  . sodium chloride flush (NS) 0.9 % injection 3 mL  3 mL Intravenous Q12H Howerter, Justin B, DO   3 mL at 10/19/19 [id number]     Discharge Medications: Please see discharge summary for a list of discharge medications.  Relevant Imaging Results:  Relevant Lab Results:   Additional Information SSN [id number]  Ross Ludwig, LCSW

## 2019-10-19 NOTE — Progress Notes (Signed)
Called pt daughter [name] to notify pt is transferring to [facility] today. Pt stable resting. Covid test cancel as facility will accept current test results. SRP, RN

## 2019-10-19 NOTE — Discharge Instructions (Signed)

## 2019-10-22 ENCOUNTER — Emergency Department (HOSPITAL_COMMUNITY)
Admission: EM | Admit: 2019-10-22 | Discharge: 2019-10-23 | Disposition: A | Discharge status: Home / Self Care | Payer: Medicare Other | Attending: Emergency Medicine | Admitting: Emergency Medicine

## 2019-10-22 ENCOUNTER — Emergency Department (HOSPITAL_COMMUNITY): Payer: Medicare Other

## 2019-10-22 DIAGNOSIS — I10 Essential (primary) hypertension: Secondary | ICD-10-CM | POA: Insufficient documentation

## 2019-10-22 DIAGNOSIS — E119 Type 2 diabetes mellitus without complications: Secondary | ICD-10-CM | POA: Diagnosis not present

## 2019-10-22 DIAGNOSIS — E876 Hypokalemia: Secondary | ICD-10-CM | POA: Diagnosis not present

## 2019-10-22 DIAGNOSIS — R4182 Altered mental status, unspecified: Secondary | ICD-10-CM | POA: Diagnosis not present

## 2019-10-22 DIAGNOSIS — R000 Tachycardia, unspecified: Secondary | ICD-10-CM | POA: Diagnosis not present

## 2019-10-22 DIAGNOSIS — S069X9A Unspecified intracranial injury with loss of consciousness of unspecified duration, initial encounter: Secondary | ICD-10-CM | POA: Diagnosis not present

## 2019-10-22 DIAGNOSIS — Z950 Presence of cardiac pacemaker: Secondary | ICD-10-CM | POA: Insufficient documentation

## 2019-10-22 DIAGNOSIS — Z79899 Other long term (current) drug therapy: Secondary | ICD-10-CM | POA: Insufficient documentation

## 2019-10-22 DIAGNOSIS — Z853 Personal history of malignant neoplasm of breast: Secondary | ICD-10-CM | POA: Diagnosis not present

## 2019-10-22 DIAGNOSIS — Z7984 Long term (current) use of oral hypoglycemic drugs: Secondary | ICD-10-CM | POA: Insufficient documentation

## 2019-10-22 DIAGNOSIS — Z7982 Long term (current) use of aspirin: Secondary | ICD-10-CM | POA: Diagnosis not present

## 2019-10-22 DIAGNOSIS — R079 Chest pain, unspecified: Secondary | ICD-10-CM | POA: Diagnosis not present

## 2019-10-22 DIAGNOSIS — S199XXA Unspecified injury of neck, initial encounter: Secondary | ICD-10-CM | POA: Diagnosis not present

## 2019-10-22 DIAGNOSIS — N390 Urinary tract infection, site not specified: Secondary | ICD-10-CM | POA: Diagnosis not present

## 2019-10-22 DIAGNOSIS — F17290 Nicotine dependence, other tobacco product, uncomplicated: Secondary | ICD-10-CM | POA: Diagnosis not present

## 2019-10-22 DIAGNOSIS — E118 Type 2 diabetes mellitus with unspecified complications: Secondary | ICD-10-CM | POA: Diagnosis not present

## 2019-10-22 DIAGNOSIS — I272 Pulmonary hypertension, unspecified: Secondary | ICD-10-CM | POA: Diagnosis not present

## 2019-10-22 LAB — URINALYSIS, COMPLETE (UACMP) WITH MICROSCOPIC
Bilirubin Urine: NEGATIVE
Glucose, UA: NEGATIVE mg/dL
Hgb urine dipstick: NEGATIVE
Ketones, ur: 5 mg/dL — AB
Nitrite: NEGATIVE
Protein, ur: 100 mg/dL — AB
Specific Gravity, Urine: 1.018 (ref 1.005–1.030)
pH: 5 (ref 5.0–8.0)

## 2019-10-22 LAB — COMPREHENSIVE METABOLIC PANEL
ALT: 24 U/L (ref 0–44)
AST: 26 U/L (ref 15–41)
Albumin: 4.5 g/dL (ref 3.5–5.0)
Alkaline Phosphatase: 50 U/L (ref 38–126)
Anion gap: 12 (ref 5–15)
BUN: 14 mg/dL (ref 8–23)
CO2: 25 mmol/L (ref 22–32)
Calcium: 9.7 mg/dL (ref 8.9–10.3)
Chloride: 100 mmol/L (ref 98–111)
Creatinine, Ser: 0.77 mg/dL (ref 0.44–1.00)
GFR calc Af Amer: >60 mL/min (ref >60)
GFR calc non Af Amer: >60 mL/min (ref >60)
Glucose, Bld: 149 mg/dL — ABNORMAL HIGH (ref 70–99)
Potassium: 3.2 mmol/L — ABNORMAL LOW (ref 3.5–5.1)
Sodium: 137 mmol/L (ref 135–145)
Total Bilirubin: 1.9 mg/dL — ABNORMAL HIGH (ref 0.3–1.2)
Total Protein: 8.2 g/dL — ABNORMAL HIGH (ref 6.5–8.1)

## 2019-10-22 LAB — CBC WITH DIFFERENTIAL/PLATELET
Abs Immature Granulocytes: 0.03 10*3/uL (ref 0.00–0.07)
Basophils Absolute: 0 10*3/uL (ref 0.0–0.1)
Basophils Relative: 0 %
Eosinophils Absolute: 0.1 10*3/uL (ref 0.0–0.5)
Eosinophils Relative: 1 %
HCT: 47.8 % — ABNORMAL HIGH (ref 36.0–46.0)
Hemoglobin: 15.5 g/dL — ABNORMAL HIGH (ref 12.0–15.0)
Immature Granulocytes: 0 %
Lymphocytes Relative: 40 %
Lymphs Abs: 2.9 10*3/uL (ref 0.7–4.0)
MCH: 28.2 pg (ref 26.0–34.0)
MCHC: 32.4 g/dL (ref 30.0–36.0)
MCV: 87.1 fL (ref 80.0–100.0)
Monocytes Absolute: 0.5 10*3/uL (ref 0.1–1.0)
Monocytes Relative: 7 %
Neutro Abs: 3.6 10*3/uL (ref 1.7–7.7)
Neutrophils Relative %: 52 %
Platelets: 254 10*3/uL (ref 150–400)
RBC: 5.49 MIL/uL — ABNORMAL HIGH (ref 3.87–5.11)
RDW: 13.2 % (ref 11.5–15.5)
WBC: 7.1 10*3/uL (ref 4.0–10.5)
nRBC: 0 % (ref 0.0–0.2)

## 2019-10-22 LAB — AMMONIA: Ammonia: 20 umol/L (ref 9–35)

## 2019-10-22 LAB — LACTIC ACID, PLASMA: Lactic Acid, Venous: 1.6 mmol/L (ref 0.5–1.9)

## 2019-10-22 LAB — CBG MONITORING, ED: Glucose-Capillary: 159 mg/dL — ABNORMAL HIGH (ref 70–99)

## 2019-10-22 NOTE — ED Triage Notes (Signed)
Came in via EMS from [place] and rehab. Reported a new resident approx 72 hours; recent hospital admission d/t metabolic encephalopathy. NH staff concern for altered mental status, normally alert and oriented. EMS endorsed unwitnessed fall last night as well.

## 2019-10-22 NOTE — ED Provider Notes (Signed)
[facility] EMERGENCY DEPARTMENT Provider Note   CSN: [id number] Arrival date & time: 10/22/19  1757     History Chief Complaint  Patient presents with  . Altered Mental Status    Maria [name] is a 86 y.o. female.  The history is provided by the nursing home, the EMS personnel and medical records. The history is limited by the condition of the patient.  Altered Mental Status Presenting symptoms: confusion and disorientation   Severity:  Unable to specify Episode history:  Continuous Timing:  Constant Progression:  Unchanged Chronicity:  New Context: dementia and nursing home resident   Context comment:  Recent unwitnessed fall Here for altered mental status.  Per daughter report the pt becomes agitated at night, daughter is concerned for sundowning in the setting of her dementia.  Pt did have a recent unwitnessed fall.       Past Medical History:  Diagnosis Date  . Anxiety   . Arthritis   . Cancer ([place])    breast 2015  . Cardiac pacemaker in situ   . Chest pain   . Diabetes mellitus without complication ([facility])   . DM type 2 (diabetes mellitus, type 2) ([place])   . Dyslipidemia   . Dyspnea    with exertion  . Family history of adverse reaction to anesthesia    daughter slow to wake up  . Gallstones   . Headache   . Hypertension   . Macular degeneration   . Pulmonary hypertension ([facility])    PA peak pressure 81 mmHg by 09/2016 echo  . Type II or unspecified type diabetes mellitus without mention of complication, not stated as uncontrolled   . Wears partial dentures     Patient Active Problem List   Diagnosis Date Noted  . Elevated lactic acid level 10/17/2019  . Generalized weakness 10/17/2019  . Hypokalemia 10/17/2019  . Dehydration 10/17/2019  . Acute encephalopathy 10/16/2019  . Heart block AV complete ([place]) 04/13/2019  . Acute acalculous cholecystitis 10/07/2016  . Elevated troponin 10/07/2016  . Lactic acidosis 10/07/2016  . Sinus  tachycardia 10/07/2016  . Dyslipidemia 06/05/2013  . Cardiac pacemaker 05/30/2013  . DM type 2 (diabetes mellitus, type 2) ([place]) 05/21/2013  . Chest pain 05/20/2013  . HTN (hypertension) 05/20/2013    Past Surgical History:  Procedure Laterality Date  . BREAST EXCISIONAL BIOPSY Right 2010  . CATARACT EXTRACTION    . CHOLECYSTECTOMY N/A 01/18/2017   Procedure: LAPAROSCOPIC CHOLECYSTECTOMY;  Surgeon: Ralene Ok, MD;  Location: [facility];  Service: General;  Laterality: N/A;  . IR GENERIC HISTORICAL  10/08/2016   IR PERC CHOLECYSTOSTOMY 10/08/2016 Greggory Keen, MD [facility]-INTER[facility]  . IR RADIOLOGIST EVAL & MGMT  11/16/2016  . KNEE SURGERY Bilateral   . MULTIPLE TOOTH EXTRACTIONS    . PACEMAKER INSERTION     [employer] 2003  . PACEMAKER INSERTION    . PPM GENERATOR CHANGEOUT N/A 04/16/2019   Procedure: PPM GENERATOR CHANGEOUT;  Surgeon: Evans Lance, MD;  Location: [facility];  Service: Cardiovascular;  Laterality: N/A;  . TOTAL KNEE ARTHROPLASTY Bilateral      OB History    Gravida  0   Para  0   Term  0   Preterm  0   AB  0   Living        SAB  0   TAB  0   Ectopic  0   Multiple      Live Births  Family History  Problem Relation Age of Onset  . Colon cancer Neg Hx     Social History   Tobacco Use  . Smoking status: Never Smoker  . Smokeless tobacco: Current User    Types: Snuff  Substance Use Topics  . Alcohol use: No    Alcohol/week: 1.0 standard drinks    Types: 1 Cans of beer per week  . Drug use: No    Home Medications Prior to Admission medications   Medication Sig Start Date End Date Taking? Authorizing Provider  amLODipine (NORVASC) 10 MG tablet Take 1 tablet (10 mg total) by mouth daily. 10/20/19 12/19/19 Yes Kayleen Memos, DO  aspirin EC 81 MG EC tablet Take 1 tablet (81 mg total) by mouth daily. 10/20/19 12/19/19 Yes Hall, Carole N, DO  atorvastatin (LIPITOR) 40 MG tablet Take 1 tablet (40 mg total) by mouth  daily at 6 PM. Patient taking differently: Take 40 mg by mouth at bedtime.  10/19/19 12/18/19 Yes Kayleen Memos, DO  Doxepin HCl 5 % CREA Apply 1 g topically 4 (four) times daily. For pain 09/12/19  Yes [provider]  Lidocaine 4 % PTCH Place 1 application onto the skin 2 (two) times daily as needed (pain (remove at bedtime)).   Yes [provider]  losartan (COZAAR) 50 MG tablet Take 50 mg by mouth daily. 12/04/18  Yes [provider]  meclizine (ANTIVERT) 25 MG tablet Take 25 mg by mouth 3 (three) times daily as needed for dizziness.   Yes [provider]  metFORMIN (GLUCOPHAGE) 500 MG tablet Take 500 mg by mouth 2 (two) times daily with a meal.  09/09/14  Yes [provider]  Multiple Vitamins-Minerals (PRESERVISION AREDS 2 PO) Take 1 tablet by mouth daily.   Yes [provider]  ondansetron (ZOFRAN ODT) 4 MG disintegrating tablet Take 1 tablet (4 mg total) by mouth every 8 (eight) hours as needed for nausea or vomiting. 12/19/18  Yes Hernandez, Ana P, PA-C  polyvinyl alcohol (ARTIFICIAL TEARS) 1.4 % ophthalmic solution Place 1 drop into both eyes daily.   Yes [provider]  Blood Glucose Monitoring Suppl (FREESTYLE LITE) DEVI  08/28/14   [provider]  cephALEXin (KEFLEX) 500 MG capsule Take 1 capsule (500 mg total) by mouth 2 (two) times daily for 7 days. 10/23/19 10/30/19  Fronnie Urton, [place], MD  FREESTYLE LITE test strip  08/28/14   [provider]  Lancets (FREESTYLE) lancets  08/28/14   [provider]    Allergies    Patient has no known allergies.  Review of Systems   Review of Systems  Unable to perform ROS: Mental status change  Psychiatric/Behavioral: Positive for confusion.    Physical Exam Updated Vital Signs BP (!) 126/54   Pulse 60   Temp 99 F (37.2 C) (Axillary)   Resp (!) 22   SpO2 100%   Physical Exam Vitals and nursing note reviewed.  Constitutional:      Appearance: She is  well-developed. She is not toxic-appearing.  HENT:     Head: Normocephalic and atraumatic.  Eyes:     Conjunctiva/sclera: Conjunctivae normal.     Pupils: Pupils are equal, round, and reactive to light.  Cardiovascular:     Rate and Rhythm: Normal rate and regular rhythm.     Pulses: Normal pulses.     Heart sounds: No murmur.  Pulmonary:     Effort: Pulmonary effort is normal. No respiratory distress.  Breath sounds: Normal breath sounds.  Abdominal:     General: There is no distension.     Palpations: Abdomen is soft.     Tenderness: There is no abdominal tenderness.  Musculoskeletal:     Cervical back: Neck supple.  Skin:    General: Skin is warm and dry.  Neurological:     GCS: GCS eye subscore is 3. GCS verbal subscore is 3. GCS motor subscore is 6.     Comments: Follows commands.  Moves all extremities  Psychiatric:        Behavior: Behavior is cooperative.     ED Results / Procedures / Treatments   Labs (all labs ordered are listed, but only abnormal results are displayed) Labs Reviewed  COMPREHENSIVE METABOLIC PANEL - Abnormal; Notable for the following components:      Result Value   Potassium 3.2 (*)    Glucose, Bld 149 (*)    Total Protein 8.2 (*)    Total Bilirubin 1.9 (*)    All other components within normal limits  CBC WITH DIFFERENTIAL/PLATELET - Abnormal; Notable for the following components:   RBC 5.49 (*)    Hemoglobin 15.5 (*)    HCT 47.8 (*)    All other components within normal limits  URINALYSIS, COMPLETE (UACMP) WITH MICROSCOPIC - Abnormal; Notable for the following components:   APPearance HAZY (*)    Ketones, ur 5 (*)    Protein, ur 100 (*)    Leukocytes,Ua SMALL (*)    Bacteria, UA RARE (*)    Non Squamous Epithelial 0-5 (*)    All other components within normal limits  CBG MONITORING, ED - Abnormal; Notable for the following components:   Glucose-Capillary 159 (*)    All other components within normal limits  POCT I-STAT EG7 -  Abnormal; Notable for the following components:   pO2, Ven 135.0 (*)    Bicarbonate 29.7 (*)    Acid-Base Excess 4.0 (*)    Potassium 7.4 (*)    Calcium, Ion 1.03 (*)    HCT 49.0 (*)    Hemoglobin 16.7 (*)    All other components within normal limits  AMMONIA  LACTIC ACID, PLASMA  I-STAT VENOUS BLOOD GAS, ED    EKG EKG Interpretation  Date/Time:  Monday October 22 2019 18:08:32 EST Ventricular Rate:  63 PR Interval:    QRS Duration: 128 QT Interval:  499 QTC Calculation: 511 R Axis:   -75 Text Interpretation: A-V dual-paced rhythm with some inhibition No further analysis attempted due to paced rhythm Confirmed by Lajean Saver [phone]) on 10/22/2019 6:21:29 PM   Radiology CT Head Wo Contrast  Result Date: 10/22/2019 CLINICAL DATA:  Altered level of consciousness, fell yesterday EXAM: CT HEAD WITHOUT CONTRAST TECHNIQUE: Contiguous axial images were obtained from the base of the skull through the vertex without intravenous contrast. COMPARISON:  10/16/2019 FINDINGS: Brain: No acute infarct or hemorrhage. Chronic small vessel ischemic changes are seen throughout the periventricular white matter and bilateral basal ganglia, stable. Lateral ventricles and midline structures are otherwise unremarkable. No acute extra-axial fluid collections. No mass effect. Vascular: No hyperdense vessel or unexpected calcification. Skull: Normal. Negative for fracture or focal lesion. Sinuses/Orbits: No acute finding. Other: None IMPRESSION: 1. Stable chronic small vessel ischemic changes. No acute intracranial process. Electronically Signed   By: Randa Ngo M.D.   On: 10/22/2019 18:59   CT Cervical Spine Wo Contrast  Result Date: 10/22/2019 CLINICAL DATA:  [place] yesterday, altered level of consciousness EXAM: CT  CERVICAL SPINE WITHOUT CONTRAST TECHNIQUE: Multidetector CT imaging of the cervical spine was performed without intravenous contrast. Multiplanar CT image reconstructions were also generated.  COMPARISON:  None. FINDINGS: Alignment: The cervical vertebral bodies are in normal anatomic alignment. Skull base and vertebrae: There are no acute displaced fractures. Soft tissues and spinal canal: No prevertebral fluid or swelling. No visible canal hematoma. Heterogeneous enlargement of the thyroid compatible with goiter. No focal nodules. Disc levels: There is extensive multilevel cervical spondylosis, greatest at C3/C4, C5/C6, and C6/C7. Upper chest: Visualized portions of the lung apices are clear. Central airways are patent. Other: Reconstructed images demonstrate no additional findings. IMPRESSION: 1. Extensive multilevel cervical spondylosis.  No acute fracture. Electronically Signed   By: Randa Ngo M.D.   On: 10/22/2019 19:02   DG Chest Port 1 View  Result Date: 10/22/2019 CLINICAL DATA:  Altered level of consciousness, painful inspiration EXAM: PORTABLE CHEST 1 VIEW COMPARISON:  10/16/2019 FINDINGS: Single frontal view of the chest demonstrates stable dual lead pacemaker. Cardiac silhouette unchanged. Lung volumes are diminished with crowding of the central vasculature. No airspace disease, effusion, or pneumothorax. No acute bony abnormality. IMPRESSION: 1. Stable exam, no acute process. Electronically Signed   By: Randa Ngo M.D.   On: 10/22/2019 18:42    Procedures Procedures (including critical care time)  Medications Ordered in ED Medications - No data to display  ED Course  I have reviewed the triage vital signs and the nursing notes.  Pertinent labs & imaging results that were available during my care of the patient were reviewed by me and considered in my medical decision making (see chart for details).    MDM Rules/Calculators/A&P                      Here for altered mental status from new nursing home.  Daughter concerned for sundowning.  Agitation comes at night only which would be consistent with sundowning.    CT head and neck ordered due to recent  unwitnessed fall, no emergent changes.    Mild hypokalemia on CMP, no other emergent changes.    UA with possible UTI, will treat with keflex.    Symptoms likely secondary to sundowning.  Discussed with daughter who reports the pt is much improved since being in the ED.  Agrees with discharge home to her facility.  Informed daughter to follow up with pt's pcp for repeat potassium.  Discharged in stable condition.     Final Clinical Impression(s) / ED Diagnoses Final diagnoses:  Altered mental status, unspecified altered mental status type  Lower urinary tract infectious disease  Hypokalemia    Rx / DC Orders ED Discharge Orders         Ordered    cephALEXin (KEFLEX) 500 MG capsule  2 times daily     10/23/19 0004           Delos Klich, [place], MD 10/23/19 0139    Lajean Saver, MD 10/23/19 1538

## 2019-10-22 NOTE — ED Notes (Signed)
Please call daughter [name] on mobile number if not at bedside for updated disposition.

## 2019-10-23 DIAGNOSIS — E860 Dehydration: Secondary | ICD-10-CM | POA: Diagnosis not present

## 2019-10-23 DIAGNOSIS — R4702 Dysphasia: Secondary | ICD-10-CM | POA: Diagnosis not present

## 2019-10-23 DIAGNOSIS — I639 Cerebral infarction, unspecified: Secondary | ICD-10-CM | POA: Diagnosis not present

## 2019-10-23 DIAGNOSIS — R4182 Altered mental status, unspecified: Secondary | ICD-10-CM | POA: Diagnosis not present

## 2019-10-23 DIAGNOSIS — G9341 Metabolic encephalopathy: Secondary | ICD-10-CM | POA: Diagnosis not present

## 2019-10-23 LAB — POCT I-STAT EG7
Acid-Base Excess: 4 mmol/L — ABNORMAL HIGH (ref 0.0–2.0)
Bicarbonate: 29.7 mmol/L — ABNORMAL HIGH (ref 20.0–28.0)
Calcium, Ion: 1.03 mmol/L — ABNORMAL LOW (ref 1.15–1.40)
HCT: 49 % — ABNORMAL HIGH (ref 36.0–46.0)
Hemoglobin: 16.7 g/dL — ABNORMAL HIGH (ref 12.0–15.0)
O2 Saturation: 99 %
Potassium: 7.4 mmol/L (ref 3.5–5.1)
Sodium: 135 mmol/L (ref 135–145)
TCO2: 31 mmol/L (ref 22–32)
pCO2, Ven: 47.2 mmHg (ref 44.0–60.0)
pH, Ven: 7.408 (ref 7.250–7.430)
pO2, Ven: 135 mmHg — ABNORMAL HIGH (ref 32.0–45.0)

## 2019-10-23 MED ORDER — CEPHALEXIN 500 MG PO CAPS: 500.0000 mg | ORAL_CAPSULE | Freq: Two times a day (BID) | ORAL | 0 refills | Status: AC

## 2019-10-23 NOTE — ED Notes (Signed)
Called [facility] for transport back to [address] [facility]--Maria [name]

## 2019-10-23 NOTE — ED Notes (Signed)
[facility] here to transport pt back to [facility].

## 2019-10-23 NOTE — Discharge Instructions (Signed)
Get your potassium rechecked this week.

## 2019-10-23 NOTE — ED Notes (Signed)
Pt remains resting on cart in NAD. Breathing easy, non-labored. Equal rise and fall of chest noted. Call light remains in reach. Bed locked, in lowest position, side rails upx2. Will continue to monitor. Denies any needs at this time. Awaiting [facility]

## 2019-10-23 NOTE — ED Notes (Signed)
Pt sleeping. 

## 2019-10-23 NOTE — ED Notes (Signed)
Care endorsed to [place], [place]

## 2019-10-23 NOTE — ED Notes (Addendum)
Assumed care of pt. Pt resting on cart breathing easy, non-labored in NAD. Equal rise and fall of chest noted. Call light placed within reach. Bed in lowest position, side rails upx2.

## 2019-10-24 DIAGNOSIS — M48061 Spinal stenosis, lumbar region without neurogenic claudication: Secondary | ICD-10-CM | POA: Diagnosis not present

## 2019-10-26 ENCOUNTER — Other Ambulatory Visit: Payer: Self-pay | Admitting: *Deleted

## 2019-10-26 NOTE — Patient Outreach (Signed)
Late entry for 10/25/19  Screened for potential [facility] Care Management needs as a benefit of  [employer].  [name] is currently receiving skilled therapy at [facility].  Writer attended telephonic interdisciplinary team meeting to assess for disposition needs and transition plan for resident.   Facility reports member has history of falls and has confusion. Per chart records, member lived alone with family assist.   Will continue to follow for transition plans while member resides in SNF.   Will plan outreach to family to discuss [place] Management services and transition plans as appropriate.  [name], MSN-Ed, RN,BSN [place] Acute Care Coordinator [phone] [facility]) [phone]  (Toll free office)

## 2019-10-30 DIAGNOSIS — R262 Difficulty in walking, not elsewhere classified: Secondary | ICD-10-CM | POA: Diagnosis not present

## 2019-10-30 DIAGNOSIS — R4189 Other symptoms and signs involving cognitive functions and awareness: Secondary | ICD-10-CM | POA: Diagnosis not present

## 2019-10-30 DIAGNOSIS — E118 Type 2 diabetes mellitus with unspecified complications: Secondary | ICD-10-CM | POA: Diagnosis not present

## 2019-10-30 DIAGNOSIS — W1789XA Other fall from one level to another, initial encounter: Secondary | ICD-10-CM | POA: Diagnosis not present

## 2019-11-09 DIAGNOSIS — M2550 Pain in unspecified joint: Secondary | ICD-10-CM | POA: Diagnosis not present

## 2019-11-09 DIAGNOSIS — R6889 Other general symptoms and signs: Secondary | ICD-10-CM | POA: Diagnosis not present

## 2019-11-09 DIAGNOSIS — I6789 Other cerebrovascular disease: Secondary | ICD-10-CM | POA: Diagnosis not present

## 2019-11-09 DIAGNOSIS — G9341 Metabolic encephalopathy: Secondary | ICD-10-CM | POA: Diagnosis not present

## 2019-11-09 DIAGNOSIS — R262 Difficulty in walking, not elsewhere classified: Secondary | ICD-10-CM | POA: Diagnosis not present

## 2019-11-09 DIAGNOSIS — K5901 Slow transit constipation: Secondary | ICD-10-CM | POA: Diagnosis not present

## 2019-11-09 DIAGNOSIS — E118 Type 2 diabetes mellitus with unspecified complications: Secondary | ICD-10-CM | POA: Diagnosis not present

## 2019-11-12 DIAGNOSIS — R4702 Dysphasia: Secondary | ICD-10-CM | POA: Diagnosis not present

## 2019-11-12 DIAGNOSIS — G9341 Metabolic encephalopathy: Secondary | ICD-10-CM | POA: Diagnosis not present

## 2019-11-12 DIAGNOSIS — R262 Difficulty in walking, not elsewhere classified: Secondary | ICD-10-CM | POA: Diagnosis not present

## 2019-11-12 DIAGNOSIS — I2729 Other secondary pulmonary hypertension: Secondary | ICD-10-CM | POA: Diagnosis not present

## 2019-11-12 DIAGNOSIS — I10 Essential (primary) hypertension: Secondary | ICD-10-CM | POA: Diagnosis not present

## 2019-11-12 DIAGNOSIS — E118 Type 2 diabetes mellitus with unspecified complications: Secondary | ICD-10-CM | POA: Diagnosis not present

## 2019-11-12 DIAGNOSIS — I6389 Other cerebral infarction: Secondary | ICD-10-CM | POA: Diagnosis not present

## 2019-11-15 ENCOUNTER — Other Ambulatory Visit: Payer: Self-pay | Admitting: *Deleted

## 2019-11-15 DIAGNOSIS — D649 Anemia, unspecified: Secondary | ICD-10-CM | POA: Diagnosis not present

## 2019-11-15 DIAGNOSIS — E1121 Type 2 diabetes mellitus with diabetic nephropathy: Secondary | ICD-10-CM | POA: Diagnosis not present

## 2019-11-15 DIAGNOSIS — M48061 Spinal stenosis, lumbar region without neurogenic claudication: Secondary | ICD-10-CM | POA: Diagnosis not present

## 2019-11-15 DIAGNOSIS — M6281 Muscle weakness (generalized): Secondary | ICD-10-CM | POA: Diagnosis not present

## 2019-11-15 DIAGNOSIS — N390 Urinary tract infection, site not specified: Secondary | ICD-10-CM | POA: Diagnosis not present

## 2019-11-15 DIAGNOSIS — E1142 Type 2 diabetes mellitus with diabetic polyneuropathy: Secondary | ICD-10-CM | POA: Diagnosis not present

## 2019-11-15 NOTE — Patient Outreach (Addendum)
Member screened for potential [facility] Care Management needs as a benefit of [place] Medicare.  Maria [name] previously discharged from  [facility] on 11/13/19.  Telephone call made to daughter Maria [name] [phone]. Patient identifiers confirmed. Maria [name] confirms member is now home. [facility] PT was in the home during writer's call.  Confirmed Maria [name]' PCP is Dr. Maia Petties who is not a [facility] provider. However, member is on Medicare's All Payer's list which makes her eligible for [facility] services. Explained Remote [facility] follow up. Maria [name] is agreeable to Probation officer making referral.   Maria [name] reports member's grandson will stay with member during the night. States she is currently interviewing caregivers to assist Maria [name] during the day time hours. Maria [name] checks in on her mother throughout the day and lives nearby. Maria [name] reports member has a medical alert system as well. Maria [name] reports her brother, member's son, is no longer assisting in [place] care.   Maria [name] expresses appreciation of the call. Will send writer's contact information,[facility] website address along with Remote Health information to Maria [name] (daughter) via email.   Will make referral to Remote Health due to high risk for readmission.    [name], MSN-Ed, RN,BSN [place] Acute Care Coordinator [phone] [facility]) [phone]  (Toll free office)

## 2019-11-19 DIAGNOSIS — Z758 Other problems related to medical facilities and other health care: Secondary | ICD-10-CM | POA: Diagnosis not present

## 2019-11-19 DIAGNOSIS — R4189 Other symptoms and signs involving cognitive functions and awareness: Secondary | ICD-10-CM | POA: Diagnosis not present

## 2019-11-19 DIAGNOSIS — K5900 Constipation, unspecified: Secondary | ICD-10-CM | POA: Diagnosis not present

## 2019-11-19 DIAGNOSIS — D259 Leiomyoma of uterus, unspecified: Secondary | ICD-10-CM | POA: Diagnosis not present

## 2019-11-19 DIAGNOSIS — E782 Mixed hyperlipidemia: Secondary | ICD-10-CM | POA: Diagnosis not present

## 2019-11-19 DIAGNOSIS — I10 Essential (primary) hypertension: Secondary | ICD-10-CM | POA: Diagnosis not present

## 2019-11-19 DIAGNOSIS — D6489 Other specified anemias: Secondary | ICD-10-CM | POA: Diagnosis not present

## 2019-11-19 DIAGNOSIS — F320 Major depressive disorder, single episode, mild: Secondary | ICD-10-CM | POA: Diagnosis not present

## 2019-11-19 DIAGNOSIS — E1142 Type 2 diabetes mellitus with diabetic polyneuropathy: Secondary | ICD-10-CM | POA: Diagnosis not present

## 2019-11-19 DIAGNOSIS — I83893 Varicose veins of bilateral lower extremities with other complications: Secondary | ICD-10-CM | POA: Diagnosis not present

## 2019-11-19 DIAGNOSIS — E1165 Type 2 diabetes mellitus with hyperglycemia: Secondary | ICD-10-CM | POA: Diagnosis not present

## 2019-11-19 DIAGNOSIS — E0821 Diabetes mellitus due to underlying condition with diabetic nephropathy: Secondary | ICD-10-CM | POA: Diagnosis not present

## 2019-11-20 DIAGNOSIS — M6281 Muscle weakness (generalized): Secondary | ICD-10-CM | POA: Diagnosis not present

## 2019-11-20 DIAGNOSIS — D649 Anemia, unspecified: Secondary | ICD-10-CM | POA: Diagnosis not present

## 2019-11-20 DIAGNOSIS — N390 Urinary tract infection, site not specified: Secondary | ICD-10-CM | POA: Diagnosis not present

## 2019-11-20 DIAGNOSIS — E1142 Type 2 diabetes mellitus with diabetic polyneuropathy: Secondary | ICD-10-CM | POA: Diagnosis not present

## 2019-11-20 DIAGNOSIS — M48061 Spinal stenosis, lumbar region without neurogenic claudication: Secondary | ICD-10-CM | POA: Diagnosis not present

## 2019-11-20 DIAGNOSIS — E1121 Type 2 diabetes mellitus with diabetic nephropathy: Secondary | ICD-10-CM | POA: Diagnosis not present

## 2019-11-23 DIAGNOSIS — I8393 Asymptomatic varicose veins of bilateral lower extremities: Secondary | ICD-10-CM | POA: Diagnosis not present

## 2019-11-23 DIAGNOSIS — N390 Urinary tract infection, site not specified: Secondary | ICD-10-CM | POA: Diagnosis not present

## 2019-11-23 DIAGNOSIS — F17220 Nicotine dependence, chewing tobacco, uncomplicated: Secondary | ICD-10-CM | POA: Diagnosis not present

## 2019-11-23 DIAGNOSIS — E1121 Type 2 diabetes mellitus with diabetic nephropathy: Secondary | ICD-10-CM | POA: Diagnosis not present

## 2019-11-23 DIAGNOSIS — I10 Essential (primary) hypertension: Secondary | ICD-10-CM | POA: Diagnosis not present

## 2019-11-23 DIAGNOSIS — E1142 Type 2 diabetes mellitus with diabetic polyneuropathy: Secondary | ICD-10-CM | POA: Diagnosis not present

## 2019-11-23 DIAGNOSIS — M6281 Muscle weakness (generalized): Secondary | ICD-10-CM | POA: Diagnosis not present

## 2019-11-23 DIAGNOSIS — M48061 Spinal stenosis, lumbar region without neurogenic claudication: Secondary | ICD-10-CM | POA: Diagnosis not present

## 2019-11-23 DIAGNOSIS — Z7984 Long term (current) use of oral hypoglycemic drugs: Secondary | ICD-10-CM | POA: Diagnosis not present

## 2019-11-23 DIAGNOSIS — D649 Anemia, unspecified: Secondary | ICD-10-CM | POA: Diagnosis not present

## 2019-11-23 DIAGNOSIS — D259 Leiomyoma of uterus, unspecified: Secondary | ICD-10-CM | POA: Diagnosis not present

## 2019-11-26 DIAGNOSIS — E1121 Type 2 diabetes mellitus with diabetic nephropathy: Secondary | ICD-10-CM | POA: Diagnosis not present

## 2019-11-26 DIAGNOSIS — M48061 Spinal stenosis, lumbar region without neurogenic claudication: Secondary | ICD-10-CM | POA: Diagnosis not present

## 2019-11-26 DIAGNOSIS — E1142 Type 2 diabetes mellitus with diabetic polyneuropathy: Secondary | ICD-10-CM | POA: Diagnosis not present

## 2019-11-26 DIAGNOSIS — N390 Urinary tract infection, site not specified: Secondary | ICD-10-CM | POA: Diagnosis not present

## 2019-11-26 DIAGNOSIS — D649 Anemia, unspecified: Secondary | ICD-10-CM | POA: Diagnosis not present

## 2019-11-26 DIAGNOSIS — M6281 Muscle weakness (generalized): Secondary | ICD-10-CM | POA: Diagnosis not present

## 2019-11-27 DIAGNOSIS — E1165 Type 2 diabetes mellitus with hyperglycemia: Secondary | ICD-10-CM | POA: Diagnosis not present

## 2019-11-27 DIAGNOSIS — H9193 Unspecified hearing loss, bilateral: Secondary | ICD-10-CM | POA: Diagnosis not present

## 2019-11-27 DIAGNOSIS — I10 Essential (primary) hypertension: Secondary | ICD-10-CM | POA: Diagnosis not present

## 2019-11-27 DIAGNOSIS — H6123 Impacted cerumen, bilateral: Secondary | ICD-10-CM | POA: Diagnosis not present

## 2019-11-27 DIAGNOSIS — K5900 Constipation, unspecified: Secondary | ICD-10-CM | POA: Insufficient documentation

## 2019-11-27 DIAGNOSIS — E782 Mixed hyperlipidemia: Secondary | ICD-10-CM | POA: Diagnosis not present

## 2019-11-27 DIAGNOSIS — R4189 Other symptoms and signs involving cognitive functions and awareness: Secondary | ICD-10-CM | POA: Insufficient documentation

## 2019-11-28 DIAGNOSIS — E1142 Type 2 diabetes mellitus with diabetic polyneuropathy: Secondary | ICD-10-CM | POA: Diagnosis not present

## 2019-11-28 DIAGNOSIS — M6281 Muscle weakness (generalized): Secondary | ICD-10-CM | POA: Diagnosis not present

## 2019-11-28 DIAGNOSIS — M48061 Spinal stenosis, lumbar region without neurogenic claudication: Secondary | ICD-10-CM | POA: Diagnosis not present

## 2019-11-28 DIAGNOSIS — N390 Urinary tract infection, site not specified: Secondary | ICD-10-CM | POA: Diagnosis not present

## 2019-11-28 DIAGNOSIS — E1121 Type 2 diabetes mellitus with diabetic nephropathy: Secondary | ICD-10-CM | POA: Diagnosis not present

## 2019-11-28 DIAGNOSIS — D649 Anemia, unspecified: Secondary | ICD-10-CM | POA: Diagnosis not present

## 2019-12-03 DIAGNOSIS — E1142 Type 2 diabetes mellitus with diabetic polyneuropathy: Secondary | ICD-10-CM | POA: Diagnosis not present

## 2019-12-03 DIAGNOSIS — N390 Urinary tract infection, site not specified: Secondary | ICD-10-CM | POA: Diagnosis not present

## 2019-12-03 DIAGNOSIS — M48061 Spinal stenosis, lumbar region without neurogenic claudication: Secondary | ICD-10-CM | POA: Diagnosis not present

## 2019-12-03 DIAGNOSIS — E1121 Type 2 diabetes mellitus with diabetic nephropathy: Secondary | ICD-10-CM | POA: Diagnosis not present

## 2019-12-03 DIAGNOSIS — M6281 Muscle weakness (generalized): Secondary | ICD-10-CM | POA: Diagnosis not present

## 2019-12-03 DIAGNOSIS — D649 Anemia, unspecified: Secondary | ICD-10-CM | POA: Diagnosis not present

## 2019-12-05 DIAGNOSIS — M48061 Spinal stenosis, lumbar region without neurogenic claudication: Secondary | ICD-10-CM | POA: Diagnosis not present

## 2019-12-05 DIAGNOSIS — D649 Anemia, unspecified: Secondary | ICD-10-CM | POA: Diagnosis not present

## 2019-12-05 DIAGNOSIS — E1142 Type 2 diabetes mellitus with diabetic polyneuropathy: Secondary | ICD-10-CM | POA: Diagnosis not present

## 2019-12-05 DIAGNOSIS — M6281 Muscle weakness (generalized): Secondary | ICD-10-CM | POA: Diagnosis not present

## 2019-12-05 DIAGNOSIS — E1121 Type 2 diabetes mellitus with diabetic nephropathy: Secondary | ICD-10-CM | POA: Diagnosis not present

## 2019-12-05 DIAGNOSIS — N390 Urinary tract infection, site not specified: Secondary | ICD-10-CM | POA: Diagnosis not present

## 2019-12-11 DIAGNOSIS — M6281 Muscle weakness (generalized): Secondary | ICD-10-CM | POA: Diagnosis not present

## 2019-12-11 DIAGNOSIS — E1142 Type 2 diabetes mellitus with diabetic polyneuropathy: Secondary | ICD-10-CM | POA: Diagnosis not present

## 2019-12-11 DIAGNOSIS — D649 Anemia, unspecified: Secondary | ICD-10-CM | POA: Diagnosis not present

## 2019-12-11 DIAGNOSIS — M48061 Spinal stenosis, lumbar region without neurogenic claudication: Secondary | ICD-10-CM | POA: Diagnosis not present

## 2019-12-11 DIAGNOSIS — E1121 Type 2 diabetes mellitus with diabetic nephropathy: Secondary | ICD-10-CM | POA: Diagnosis not present

## 2019-12-11 DIAGNOSIS — N390 Urinary tract infection, site not specified: Secondary | ICD-10-CM | POA: Diagnosis not present

## 2019-12-13 DIAGNOSIS — E1142 Type 2 diabetes mellitus with diabetic polyneuropathy: Secondary | ICD-10-CM | POA: Diagnosis not present

## 2019-12-13 DIAGNOSIS — M48061 Spinal stenosis, lumbar region without neurogenic claudication: Secondary | ICD-10-CM | POA: Diagnosis not present

## 2019-12-13 DIAGNOSIS — N390 Urinary tract infection, site not specified: Secondary | ICD-10-CM | POA: Diagnosis not present

## 2019-12-13 DIAGNOSIS — D649 Anemia, unspecified: Secondary | ICD-10-CM | POA: Diagnosis not present

## 2019-12-13 DIAGNOSIS — M6281 Muscle weakness (generalized): Secondary | ICD-10-CM | POA: Diagnosis not present

## 2019-12-13 DIAGNOSIS — E1121 Type 2 diabetes mellitus with diabetic nephropathy: Secondary | ICD-10-CM | POA: Diagnosis not present

## 2019-12-20 DIAGNOSIS — M6281 Muscle weakness (generalized): Secondary | ICD-10-CM | POA: Diagnosis not present

## 2019-12-20 DIAGNOSIS — N390 Urinary tract infection, site not specified: Secondary | ICD-10-CM | POA: Diagnosis not present

## 2019-12-20 DIAGNOSIS — M48061 Spinal stenosis, lumbar region without neurogenic claudication: Secondary | ICD-10-CM | POA: Diagnosis not present

## 2019-12-20 DIAGNOSIS — E1121 Type 2 diabetes mellitus with diabetic nephropathy: Secondary | ICD-10-CM | POA: Diagnosis not present

## 2019-12-20 DIAGNOSIS — D649 Anemia, unspecified: Secondary | ICD-10-CM | POA: Diagnosis not present

## 2019-12-20 DIAGNOSIS — E1142 Type 2 diabetes mellitus with diabetic polyneuropathy: Secondary | ICD-10-CM | POA: Diagnosis not present

## 2019-12-23 DIAGNOSIS — N390 Urinary tract infection, site not specified: Secondary | ICD-10-CM | POA: Diagnosis not present

## 2019-12-23 DIAGNOSIS — M6281 Muscle weakness (generalized): Secondary | ICD-10-CM | POA: Diagnosis not present

## 2019-12-23 DIAGNOSIS — F17220 Nicotine dependence, chewing tobacco, uncomplicated: Secondary | ICD-10-CM | POA: Diagnosis not present

## 2019-12-23 DIAGNOSIS — I8393 Asymptomatic varicose veins of bilateral lower extremities: Secondary | ICD-10-CM | POA: Diagnosis not present

## 2019-12-23 DIAGNOSIS — I10 Essential (primary) hypertension: Secondary | ICD-10-CM | POA: Diagnosis not present

## 2019-12-23 DIAGNOSIS — Z7984 Long term (current) use of oral hypoglycemic drugs: Secondary | ICD-10-CM | POA: Diagnosis not present

## 2019-12-23 DIAGNOSIS — D649 Anemia, unspecified: Secondary | ICD-10-CM | POA: Diagnosis not present

## 2019-12-23 DIAGNOSIS — E1121 Type 2 diabetes mellitus with diabetic nephropathy: Secondary | ICD-10-CM | POA: Diagnosis not present

## 2019-12-23 DIAGNOSIS — E1142 Type 2 diabetes mellitus with diabetic polyneuropathy: Secondary | ICD-10-CM | POA: Diagnosis not present

## 2019-12-23 DIAGNOSIS — M48061 Spinal stenosis, lumbar region without neurogenic claudication: Secondary | ICD-10-CM | POA: Diagnosis not present

## 2019-12-23 DIAGNOSIS — D259 Leiomyoma of uterus, unspecified: Secondary | ICD-10-CM | POA: Diagnosis not present

## 2019-12-24 DIAGNOSIS — D649 Anemia, unspecified: Secondary | ICD-10-CM | POA: Diagnosis not present

## 2019-12-24 DIAGNOSIS — N390 Urinary tract infection, site not specified: Secondary | ICD-10-CM | POA: Diagnosis not present

## 2019-12-24 DIAGNOSIS — M48061 Spinal stenosis, lumbar region without neurogenic claudication: Secondary | ICD-10-CM | POA: Diagnosis not present

## 2019-12-24 DIAGNOSIS — E1142 Type 2 diabetes mellitus with diabetic polyneuropathy: Secondary | ICD-10-CM | POA: Diagnosis not present

## 2019-12-24 DIAGNOSIS — M6281 Muscle weakness (generalized): Secondary | ICD-10-CM | POA: Diagnosis not present

## 2019-12-24 DIAGNOSIS — E1121 Type 2 diabetes mellitus with diabetic nephropathy: Secondary | ICD-10-CM | POA: Diagnosis not present

## 2019-12-26 DIAGNOSIS — M48061 Spinal stenosis, lumbar region without neurogenic claudication: Secondary | ICD-10-CM | POA: Diagnosis not present

## 2019-12-26 DIAGNOSIS — N390 Urinary tract infection, site not specified: Secondary | ICD-10-CM | POA: Diagnosis not present

## 2019-12-26 DIAGNOSIS — D649 Anemia, unspecified: Secondary | ICD-10-CM | POA: Diagnosis not present

## 2019-12-26 DIAGNOSIS — E1142 Type 2 diabetes mellitus with diabetic polyneuropathy: Secondary | ICD-10-CM | POA: Diagnosis not present

## 2019-12-26 DIAGNOSIS — M6281 Muscle weakness (generalized): Secondary | ICD-10-CM | POA: Diagnosis not present

## 2019-12-26 DIAGNOSIS — E1121 Type 2 diabetes mellitus with diabetic nephropathy: Secondary | ICD-10-CM | POA: Diagnosis not present

## 2020-01-02 DIAGNOSIS — M48061 Spinal stenosis, lumbar region without neurogenic claudication: Secondary | ICD-10-CM | POA: Diagnosis not present

## 2020-01-02 DIAGNOSIS — N390 Urinary tract infection, site not specified: Secondary | ICD-10-CM | POA: Diagnosis not present

## 2020-01-02 DIAGNOSIS — D649 Anemia, unspecified: Secondary | ICD-10-CM | POA: Diagnosis not present

## 2020-01-02 DIAGNOSIS — E1121 Type 2 diabetes mellitus with diabetic nephropathy: Secondary | ICD-10-CM | POA: Diagnosis not present

## 2020-01-02 DIAGNOSIS — E1142 Type 2 diabetes mellitus with diabetic polyneuropathy: Secondary | ICD-10-CM | POA: Diagnosis not present

## 2020-01-02 DIAGNOSIS — M6281 Muscle weakness (generalized): Secondary | ICD-10-CM | POA: Diagnosis not present

## 2020-01-04 DIAGNOSIS — N390 Urinary tract infection, site not specified: Secondary | ICD-10-CM | POA: Diagnosis not present

## 2020-01-04 DIAGNOSIS — E1142 Type 2 diabetes mellitus with diabetic polyneuropathy: Secondary | ICD-10-CM | POA: Diagnosis not present

## 2020-01-04 DIAGNOSIS — M6281 Muscle weakness (generalized): Secondary | ICD-10-CM | POA: Diagnosis not present

## 2020-01-04 DIAGNOSIS — E1121 Type 2 diabetes mellitus with diabetic nephropathy: Secondary | ICD-10-CM | POA: Diagnosis not present

## 2020-01-04 DIAGNOSIS — D649 Anemia, unspecified: Secondary | ICD-10-CM | POA: Diagnosis not present

## 2020-01-04 DIAGNOSIS — M48061 Spinal stenosis, lumbar region without neurogenic claudication: Secondary | ICD-10-CM | POA: Diagnosis not present

## 2020-01-08 DIAGNOSIS — M6281 Muscle weakness (generalized): Secondary | ICD-10-CM | POA: Diagnosis not present

## 2020-01-08 DIAGNOSIS — M48061 Spinal stenosis, lumbar region without neurogenic claudication: Secondary | ICD-10-CM | POA: Diagnosis not present

## 2020-01-08 DIAGNOSIS — D649 Anemia, unspecified: Secondary | ICD-10-CM | POA: Diagnosis not present

## 2020-01-08 DIAGNOSIS — N390 Urinary tract infection, site not specified: Secondary | ICD-10-CM | POA: Diagnosis not present

## 2020-01-08 DIAGNOSIS — E1121 Type 2 diabetes mellitus with diabetic nephropathy: Secondary | ICD-10-CM | POA: Diagnosis not present

## 2020-01-08 DIAGNOSIS — E1142 Type 2 diabetes mellitus with diabetic polyneuropathy: Secondary | ICD-10-CM | POA: Diagnosis not present

## 2020-01-15 ENCOUNTER — Ambulatory Visit (INDEPENDENT_AMBULATORY_CARE_PROVIDER_SITE_OTHER): Payer: Medicare Other | Admitting: *Deleted

## 2020-01-15 DIAGNOSIS — I442 Atrioventricular block, complete: Secondary | ICD-10-CM | POA: Diagnosis not present

## 2020-01-15 LAB — CUP PACEART REMOTE DEVICE CHECK
Battery Remaining Longevity: 129 mo
Battery Voltage: 3.06 V
Brady Statistic AP VP Percent: 20.82 %
Brady Statistic AP VS Percent: 0.01 %
Brady Statistic AS VP Percent: 76.89 %
Brady Statistic AS VS Percent: 2.29 %
Brady Statistic RA Percent Paced: 21.28 %
Brady Statistic RV Percent Paced: 97.71 %
Date Time Interrogation Session: 20210504033758
Implantable Lead Implant Date: 20030311
Implantable Lead Implant Date: 20030311
Implantable Lead Location: 753859
Implantable Lead Location: 753860
Implantable Lead Model: 4092
Implantable Lead Model: 4592
Implantable Pulse Generator Implant Date: 20200803
Lead Channel Impedance Value: 418 Ohm
Lead Channel Impedance Value: 456 Ohm
Lead Channel Impedance Value: 475 Ohm
Lead Channel Impedance Value: 513 Ohm
Lead Channel Pacing Threshold Amplitude: 0.375 V
Lead Channel Pacing Threshold Amplitude: 0.625 V
Lead Channel Pacing Threshold Pulse Width: 0.4 ms
Lead Channel Pacing Threshold Pulse Width: 0.4 ms
Lead Channel Sensing Intrinsic Amplitude: 19.875 mV
Lead Channel Sensing Intrinsic Amplitude: 19.875 mV
Lead Channel Sensing Intrinsic Amplitude: 2.375 mV
Lead Channel Sensing Intrinsic Amplitude: 2.375 mV
Lead Channel Setting Pacing Amplitude: 1.5 V
Lead Channel Setting Pacing Amplitude: 2.5 V
Lead Channel Setting Pacing Pulse Width: 0.4 ms
Lead Channel Setting Sensing Sensitivity: 4 mV

## 2020-01-16 NOTE — Progress Notes (Signed)
Remote pacemaker transmission.   

## 2020-02-01 DIAGNOSIS — R531 Weakness: Secondary | ICD-10-CM | POA: Diagnosis not present

## 2020-02-01 DIAGNOSIS — E860 Dehydration: Secondary | ICD-10-CM | POA: Diagnosis not present

## 2020-02-05 ENCOUNTER — Other Ambulatory Visit: Payer: Self-pay | Admitting: Internal Medicine

## 2020-02-05 DIAGNOSIS — Z1231 Encounter for screening mammogram for malignant neoplasm of breast: Secondary | ICD-10-CM

## 2020-02-26 ENCOUNTER — Ambulatory Visit
Admission: RE | Admit: 2020-02-26 | Discharge: 2020-02-26 | Disposition: A | Discharge status: Home / Self Care | Payer: Medicare Other | Source: Ambulatory Visit | Attending: Internal Medicine | Admitting: Internal Medicine

## 2020-02-26 ENCOUNTER — Other Ambulatory Visit: Payer: Self-pay

## 2020-02-26 DIAGNOSIS — Z1231 Encounter for screening mammogram for malignant neoplasm of breast: Secondary | ICD-10-CM

## 2020-04-01 ENCOUNTER — Encounter (HOSPITAL_COMMUNITY): Payer: Self-pay | Admitting: *Deleted

## 2020-04-01 ENCOUNTER — Other Ambulatory Visit: Payer: Self-pay

## 2020-04-01 ENCOUNTER — Emergency Department (HOSPITAL_COMMUNITY): Payer: Medicare Other

## 2020-04-01 ENCOUNTER — Emergency Department (HOSPITAL_COMMUNITY)
Admission: EM | Admit: 2020-04-01 | Discharge: 2020-04-01 | Disposition: A | Discharge status: Home / Self Care | Payer: Medicare Other | Attending: Emergency Medicine | Admitting: Emergency Medicine

## 2020-04-01 DIAGNOSIS — R079 Chest pain, unspecified: Secondary | ICD-10-CM | POA: Diagnosis not present

## 2020-04-01 DIAGNOSIS — W19XXXA Unspecified fall, initial encounter: Secondary | ICD-10-CM

## 2020-04-01 DIAGNOSIS — W101XXA Fall (on)(from) sidewalk curb, initial encounter: Secondary | ICD-10-CM | POA: Diagnosis not present

## 2020-04-01 DIAGNOSIS — F17228 Nicotine dependence, chewing tobacco, with other nicotine-induced disorders: Secondary | ICD-10-CM | POA: Diagnosis not present

## 2020-04-01 DIAGNOSIS — R1084 Generalized abdominal pain: Secondary | ICD-10-CM | POA: Diagnosis not present

## 2020-04-01 DIAGNOSIS — Z79899 Other long term (current) drug therapy: Secondary | ICD-10-CM | POA: Insufficient documentation

## 2020-04-01 DIAGNOSIS — R0602 Shortness of breath: Secondary | ICD-10-CM | POA: Diagnosis not present

## 2020-04-01 DIAGNOSIS — S0990XA Unspecified injury of head, initial encounter: Secondary | ICD-10-CM

## 2020-04-01 DIAGNOSIS — Y92480 Sidewalk as the place of occurrence of the external cause: Secondary | ICD-10-CM | POA: Insufficient documentation

## 2020-04-01 DIAGNOSIS — Z96652 Presence of left artificial knee joint: Secondary | ICD-10-CM | POA: Insufficient documentation

## 2020-04-01 DIAGNOSIS — Z7984 Long term (current) use of oral hypoglycemic drugs: Secondary | ICD-10-CM | POA: Insufficient documentation

## 2020-04-01 DIAGNOSIS — E119 Type 2 diabetes mellitus without complications: Secondary | ICD-10-CM | POA: Insufficient documentation

## 2020-04-01 DIAGNOSIS — R58 Hemorrhage, not elsewhere classified: Secondary | ICD-10-CM | POA: Diagnosis not present

## 2020-04-01 DIAGNOSIS — Z853 Personal history of malignant neoplasm of breast: Secondary | ICD-10-CM | POA: Diagnosis not present

## 2020-04-01 DIAGNOSIS — E785 Hyperlipidemia, unspecified: Secondary | ICD-10-CM | POA: Diagnosis not present

## 2020-04-01 DIAGNOSIS — R0789 Other chest pain: Secondary | ICD-10-CM | POA: Diagnosis not present

## 2020-04-01 DIAGNOSIS — Y998 Other external cause status: Secondary | ICD-10-CM | POA: Diagnosis not present

## 2020-04-01 DIAGNOSIS — R40241 Glasgow coma scale score 13-15, unspecified time: Secondary | ICD-10-CM | POA: Diagnosis not present

## 2020-04-01 DIAGNOSIS — I10 Essential (primary) hypertension: Secondary | ICD-10-CM | POA: Insufficient documentation

## 2020-04-01 DIAGNOSIS — Y9301 Activity, walking, marching and hiking: Secondary | ICD-10-CM | POA: Insufficient documentation

## 2020-04-01 DIAGNOSIS — M545 Low back pain: Secondary | ICD-10-CM | POA: Diagnosis not present

## 2020-04-01 DIAGNOSIS — Z96651 Presence of right artificial knee joint: Secondary | ICD-10-CM | POA: Diagnosis not present

## 2020-04-01 DIAGNOSIS — S199XXA Unspecified injury of neck, initial encounter: Secondary | ICD-10-CM | POA: Diagnosis not present

## 2020-04-01 DIAGNOSIS — Z950 Presence of cardiac pacemaker: Secondary | ICD-10-CM | POA: Diagnosis not present

## 2020-04-01 LAB — COMPREHENSIVE METABOLIC PANEL
ALT: 26 U/L (ref 0–44)
AST: 24 U/L (ref 15–41)
Albumin: 4.5 g/dL (ref 3.5–5.0)
Alkaline Phosphatase: 47 U/L (ref 38–126)
Anion gap: 12 (ref 5–15)
BUN: 15 mg/dL (ref 8–23)
CO2: 25 mmol/L (ref 22–32)
Calcium: 9.8 mg/dL (ref 8.9–10.3)
Chloride: 99 mmol/L (ref 98–111)
Creatinine, Ser: 0.67 mg/dL (ref 0.44–1.00)
GFR calc Af Amer: >60 mL/min (ref >60)
GFR calc non Af Amer: >60 mL/min (ref >60)
Glucose, Bld: 168 mg/dL — ABNORMAL HIGH (ref 70–99)
Potassium: 4.4 mmol/L (ref 3.5–5.1)
Sodium: 136 mmol/L (ref 135–145)
Total Bilirubin: 0.9 mg/dL (ref 0.3–1.2)
Total Protein: 8.2 g/dL — ABNORMAL HIGH (ref 6.5–8.1)

## 2020-04-01 LAB — CBC WITH DIFFERENTIAL/PLATELET
Abs Immature Granulocytes: 0.04 10*3/uL (ref 0.00–0.07)
Basophils Absolute: 0 10*3/uL (ref 0.0–0.1)
Basophils Relative: 0 %
Eosinophils Absolute: 0.1 10*3/uL (ref 0.0–0.5)
Eosinophils Relative: 1 %
HCT: 42.8 % (ref 36.0–46.0)
Hemoglobin: 13.4 g/dL (ref 12.0–15.0)
Immature Granulocytes: 1 %
Lymphocytes Relative: 37 %
Lymphs Abs: 2.7 10*3/uL (ref 0.7–4.0)
MCH: 27.3 pg (ref 26.0–34.0)
MCHC: 31.3 g/dL (ref 30.0–36.0)
MCV: 87.3 fL (ref 80.0–100.0)
Monocytes Absolute: 0.7 10*3/uL (ref 0.1–1.0)
Monocytes Relative: 9 %
Neutro Abs: 3.8 10*3/uL (ref 1.7–7.7)
Neutrophils Relative %: 52 %
Platelets: 251 10*3/uL (ref 150–400)
RBC: 4.9 MIL/uL (ref 3.87–5.11)
RDW: 12.8 % (ref 11.5–15.5)
WBC: 7.3 10*3/uL (ref 4.0–10.5)
nRBC: 0.3 % — ABNORMAL HIGH (ref 0.0–0.2)

## 2020-04-01 LAB — URINALYSIS, ROUTINE W REFLEX MICROSCOPIC
Bilirubin Urine: NEGATIVE
Glucose, UA: 50 mg/dL — AB
Hgb urine dipstick: NEGATIVE
Ketones, ur: NEGATIVE mg/dL
Leukocytes,Ua: NEGATIVE
Nitrite: NEGATIVE
Protein, ur: 100 mg/dL — AB
Specific Gravity, Urine: 1.009 (ref 1.005–1.030)
pH: 7 (ref 5.0–8.0)

## 2020-04-01 LAB — PROTIME-INR
INR: 0.9 (ref 0.8–1.2)
Prothrombin Time: 12.1 seconds (ref 11.4–15.2)

## 2020-04-01 MED ORDER — ACETAMINOPHEN 500 MG PO TABS
1000.0000 mg | ORAL_TABLET | Freq: Once | ORAL | Status: AC
  Administered 2020-04-01: 1000 mg via ORAL

## 2020-04-01 NOTE — Discharge Instructions (Signed)
There was some swelling in your thyroid that will need to be followed as an outpatient.

## 2020-04-01 NOTE — ED Notes (Addendum)
Pt stated she will wait for her daughter in the lobby. Pt A&Ox4 and ambulatory with little assistance, pt waiting in wheelchair in lobby as requested.

## 2020-04-01 NOTE — ED Notes (Signed)
This RN spoke to Gwenlyn Saran, pts daughter. Per pt daughter, she is coming to pick up pt (pt has discharge orders). [phone]  Per pt daugher, pt has grandson staying with her at night to help her.

## 2020-04-01 NOTE — ED Provider Notes (Signed)
[facility] DEPT Provider Note   CSN: [id number] Arrival date & time: 04/01/20  1544     History No chief complaint on file.   Maria [name] is a 86 y.o. female.  HPI Patient reports she had been at the dentist's office.  Her daughter was assisting her on her rolling walker.  She reports it got too close and caught the curb, causing her to be tipped over backwards and hitting the back of her head.  Patient denies she had any loss of consciousness.  She reports that she does have a bit of a headache but not severe.  No visual changes.  She reports there is one area on the back of her neck to the left of her spine in the mid level that is somewhat tender.  No nausea no vomiting.  Patient denies chest pain but does feel some achiness in her shoulders.  She reports some of this is chronic and possibly worse from her fall.  She is able to move both arms at the elbows and wrists without limitations.  She denies pain in her hips or legs.  She denies pain in the knees.  She reports however after she fell she was having some trouble getting up.  Medics reports that they did assist her and she took a couple of steps with assistance to the stretcher.  Patient reports that she had a hospitalization within the last several months, that was prolonged and resulted in her having a fairly long rehabilitation hospitalization.  She reports at that time she was very weak and had difficulty walking as well.    Past Medical History:  Diagnosis Date  . Anxiety   . Arthritis   . Cancer ([place])    breast 2015  . Cardiac pacemaker in situ   . Chest pain   . Diabetes mellitus without complication ([place])   . DM type 2 (diabetes mellitus, type 2) ([place])   . Dyslipidemia   . Dyspnea    with exertion  . Family history of adverse reaction to anesthesia    daughter slow to wake up  . Gallstones   . Headache   . Hypertension   . Macular degeneration   . Pulmonary hypertension ([facility])     PA peak pressure 81 mmHg by 09/2016 echo  . Type II or unspecified type diabetes mellitus without mention of complication, not stated as uncontrolled   . Wears partial dentures     Patient Active Problem List   Diagnosis Date Noted  . Elevated lactic acid level 10/17/2019  . Generalized weakness 10/17/2019  . Hypokalemia 10/17/2019  . Dehydration 10/17/2019  . Acute encephalopathy 10/16/2019  . Heart block AV complete ([place]) 04/13/2019  . Acute acalculous cholecystitis 10/07/2016  . Elevated troponin 10/07/2016  . Lactic acidosis 10/07/2016  . Sinus tachycardia 10/07/2016  . Dyslipidemia 06/05/2013  . Cardiac pacemaker 05/30/2013  . DM type 2 (diabetes mellitus, type 2) ([place]) 05/21/2013  . Chest pain 05/20/2013  . HTN (hypertension) 05/20/2013    Past Surgical History:  Procedure Laterality Date  . BREAST EXCISIONAL BIOPSY Right 2010  . CATARACT EXTRACTION    . CHOLECYSTECTOMY N/A 01/18/2017   Procedure: LAPAROSCOPIC CHOLECYSTECTOMY;  Surgeon: Ralene Ok, MD;  Location: [facility];  Service: General;  Laterality: N/A;  . IR GENERIC HISTORICAL  10/08/2016   IR PERC CHOLECYSTOSTOMY 10/08/2016 Greggory Keen, MD [facility]  . IR RADIOLOGIST EVAL & MGMT  11/16/2016  . KNEE SURGERY Bilateral   .  MULTIPLE TOOTH EXTRACTIONS    . PACEMAKER INSERTION     Medtronic, [facility] 2003  . PACEMAKER INSERTION    . PPM GENERATOR CHANGEOUT N/A 04/16/2019   Procedure: PPM GENERATOR CHANGEOUT;  Surgeon: Evans Lance, MD;  Location: [facility];  Service: Cardiovascular;  Laterality: N/A;  . TOTAL KNEE ARTHROPLASTY Bilateral      OB History    Gravida  0   Para  0   Term  0   Preterm  0   AB  0   Living        SAB  0   TAB  0   Ectopic  0   Multiple      Live Births              Family History  Problem Relation Age of Onset  . Colon cancer Neg Hx     Social History   Tobacco Use  . Smoking status: Never Smoker  . Smokeless tobacco: Current User     Types: Snuff  Vaping Use  . Vaping Use: Never used  Substance Use Topics  . Alcohol use: No    Alcohol/week: 1.0 standard drink    Types: 1 Cans of beer per week  . Drug use: No    Home Medications Prior to Admission medications   Medication Sig Start Date End Date Taking? Authorizing Provider  amLODipine (NORVASC) 10 MG tablet Take 1 tablet (10 mg total) by mouth daily. 10/20/19 12/19/19  Kayleen Memos, DO  atorvastatin (LIPITOR) 40 MG tablet Take 1 tablet (40 mg total) by mouth daily at 6 PM. Patient taking differently: Take 40 mg by mouth at bedtime.  10/19/19 12/18/19  Kayleen Memos, DO  Blood Glucose Monitoring Suppl (FREESTYLE LITE) DEVI  08/28/14   [provider]  Doxepin HCl 5 % CREA Apply 1 g topically 4 (four) times daily. For pain 09/12/19   [provider]  FREESTYLE LITE test strip  08/28/14   [provider]  Lancets (FREESTYLE) lancets  08/28/14   [provider]  Lidocaine 4 % PTCH Place 1 application onto the skin 2 (two) times daily as needed (pain (remove at bedtime)).    [provider]  losartan (COZAAR) 50 MG tablet Take 50 mg by mouth daily. 12/04/18   [provider]  meclizine (ANTIVERT) 25 MG tablet Take 25 mg by mouth 3 (three) times daily as needed for dizziness.    [provider]  metFORMIN (GLUCOPHAGE) 500 MG tablet Take 500 mg by mouth 2 (two) times daily with a meal.  09/09/14   [provider]  Multiple Vitamins-Minerals (PRESERVISION AREDS 2 PO) Take 1 tablet by mouth daily.    [provider]  ondansetron (ZOFRAN ODT) 4 MG disintegrating tablet Take 1 tablet (4 mg total) by mouth every 8 (eight) hours as needed for nausea or vomiting. 12/19/18   Darlin Drop P, PA-C  polyvinyl alcohol (ARTIFICIAL TEARS) 1.4 % ophthalmic solution Place 1 drop into both eyes daily.    [provider]    Allergies    Patient has no known allergies.  Review of Systems   Review of  Systems 10 systems reviewed and negative except as per HPI Physical Exam Updated Vital Signs There were no vitals taken for this visit.  Physical Exam Constitutional:      Comments: Patient is alert.  She is examined upon arrival with EMS.  No respiratory distress.  GCS is  15.  HENT:     Head:     Comments: 2 cm hematoma to the occiput.  No bleeding or abrasion.    Nose: Nose normal.     Mouth/Throat:     Mouth: Mucous membranes are moist.     Pharynx: Oropharynx is clear.  Eyes:     Extraocular Movements: Extraocular movements intact.     Pupils: Pupils are equal, round, and reactive to light.  Neck:     Comments: Patient has a focus of some discomfort at about C4 just to the left of the paraspinous muscle bodies. Cardiovascular:     Rate and Rhythm: Normal rate and regular rhythm.  Pulmonary:     Effort: Pulmonary effort is normal.     Breath sounds: Normal breath sounds.  Chest:     Chest wall: No tenderness.  Abdominal:     General: There is no distension.     Palpations: Abdomen is soft.     Tenderness: There is no abdominal tenderness. There is no guarding.  Musculoskeletal:        General: No swelling or tenderness. Normal range of motion.     Right lower leg: No edema.     Left lower leg: No edema.     Comments: No peripheral edema.  No deformities of the knees or the ankles.  Can elevate hips and knees without significant pain.  Patient is wearing some knee guards both knees.  Skin:    General: Skin is warm and dry.  Neurological:     General: No focal deficit present.     Mental Status: She is oriented to person, place, and time.     Cranial Nerves: No cranial nerve deficit.     Coordination: Coordination normal.     Comments: Patient is conversing appropriately.  No evidence of aphasia.  She follows commands appropriately.  No focal motor deficits.  Psychiatric:        Mood and Affect: Mood normal.     ED Results / Procedures / Treatments   Labs (all  labs ordered are listed, but only abnormal results are displayed) Labs Reviewed  URINALYSIS, ROUTINE W REFLEX MICROSCOPIC  COMPREHENSIVE METABOLIC PANEL  CBC WITH DIFFERENTIAL/PLATELET  PROTIME-INR    EKG None  Radiology No results found.  Procedures Procedures (including critical care time)  Medications Ordered in ED Medications  acetaminophen (TYLENOL) tablet 1,000 mg (has no administration in time range)    ED Course  I have reviewed the triage vital signs and the nursing notes.  Pertinent labs & imaging results that were available during my care of the patient were reviewed by me and considered in my medical decision making (see chart for details).    MDM Rules/Calculators/A&P                          Patient sustained mechanical fall.  She reports hitting the back of her head.  She endorses some neck pain.  Will obtain CT head and neck.  Also some discomfort shoulders.  Will get x-rays of her chest.  Patient describes recent generalized weakness will obtain basic lab work.  Patient is clinically well in appearance.  Mental status is clear.  If diagnostic evaluation is normal limits.  Anticipate discharge.  Oncoming physician assumes care for final disposition and review diagnostic results. Final Clinical Impression(s) / ED Diagnoses Final diagnoses:  Fall, initial encounter  Injury of head, initial encounter    Rx /  DC Orders ED Discharge Orders    None       Charlesetta Shanks, MD 04/03/20 [phone]

## 2020-04-01 NOTE — ED Notes (Signed)
An After Visit Summary was printed and given to the patient. Discharge instructions given and no further questions at this time.  

## 2020-04-01 NOTE — ED Provider Notes (Signed)
  Physical Exam  BP (!) 153/73 (BP Location: Left Arm)   Pulse 77   Temp 98.6 F (37 C) (Oral)   Resp 13   Ht 5\' 4"  (1.626 m)   Wt 64.9 kg   SpO2 100%   BMI 24.55 kg/m   Physical Exam  ED Course/Procedures     Procedures  MDM  Received patient in signout.  Fell and hit head.  Imaging reassuring.  Lab work reassuring due to generalized weakness.  Discharge home.       Davonna Belling, MD 04/01/20 2128

## 2020-04-01 NOTE — ED Triage Notes (Signed)
BIB EMS after fall off her rolling walker. Hit back of head, no LOC, abrasion to rt elbow, low back pain. Able to stand and move to stretcher for EMS. 130/90-80-16-98% CBG 239

## 2020-04-02 DIAGNOSIS — R630 Anorexia: Secondary | ICD-10-CM | POA: Diagnosis not present

## 2020-04-02 DIAGNOSIS — R1033 Periumbilical pain: Secondary | ICD-10-CM | POA: Diagnosis not present

## 2020-04-02 DIAGNOSIS — W108XXD Fall (on) (from) other stairs and steps, subsequent encounter: Secondary | ICD-10-CM | POA: Diagnosis not present

## 2020-04-04 ENCOUNTER — Other Ambulatory Visit: Payer: Self-pay | Admitting: Gastroenterology

## 2020-04-04 ENCOUNTER — Other Ambulatory Visit (HOSPITAL_COMMUNITY): Payer: Self-pay | Admitting: Gastroenterology

## 2020-04-04 DIAGNOSIS — R1033 Periumbilical pain: Secondary | ICD-10-CM

## 2020-04-09 DIAGNOSIS — M160 Bilateral primary osteoarthritis of hip: Secondary | ICD-10-CM | POA: Diagnosis not present

## 2020-04-09 DIAGNOSIS — I10 Essential (primary) hypertension: Secondary | ICD-10-CM | POA: Diagnosis not present

## 2020-04-09 DIAGNOSIS — E1165 Type 2 diabetes mellitus with hyperglycemia: Secondary | ICD-10-CM | POA: Diagnosis not present

## 2020-04-09 DIAGNOSIS — W19XXXD Unspecified fall, subsequent encounter: Secondary | ICD-10-CM | POA: Diagnosis not present

## 2020-04-09 DIAGNOSIS — I6381 Other cerebral infarction due to occlusion or stenosis of small artery: Secondary | ICD-10-CM | POA: Diagnosis not present

## 2020-04-09 DIAGNOSIS — E042 Nontoxic multinodular goiter: Secondary | ICD-10-CM | POA: Diagnosis not present

## 2020-04-09 DIAGNOSIS — I6529 Occlusion and stenosis of unspecified carotid artery: Secondary | ICD-10-CM | POA: Diagnosis not present

## 2020-04-14 DIAGNOSIS — M160 Bilateral primary osteoarthritis of hip: Secondary | ICD-10-CM | POA: Diagnosis not present

## 2020-04-15 ENCOUNTER — Ambulatory Visit (INDEPENDENT_AMBULATORY_CARE_PROVIDER_SITE_OTHER): Payer: Medicare Other | Admitting: *Deleted

## 2020-04-15 DIAGNOSIS — I442 Atrioventricular block, complete: Secondary | ICD-10-CM | POA: Diagnosis not present

## 2020-04-15 LAB — CUP PACEART REMOTE DEVICE CHECK
Battery Remaining Longevity: 127 mo
Battery Voltage: 3.04 V
Brady Statistic AP VP Percent: 13.21 %
Brady Statistic AP VS Percent: 0 %
Brady Statistic AS VP Percent: 82.99 %
Brady Statistic AS VS Percent: 3.79 %
Brady Statistic RA Percent Paced: 14.37 %
Brady Statistic RV Percent Paced: 96.21 %
Date Time Interrogation Session: 20210802193823
Implantable Lead Implant Date: 20030311
Implantable Lead Implant Date: 20030311
Implantable Lead Location: 753859
Implantable Lead Location: 753860
Implantable Lead Model: 4092
Implantable Lead Model: 4592
Implantable Pulse Generator Implant Date: 20200803
Lead Channel Impedance Value: 418 Ohm
Lead Channel Impedance Value: 475 Ohm
Lead Channel Impedance Value: 494 Ohm
Lead Channel Impedance Value: 551 Ohm
Lead Channel Pacing Threshold Amplitude: 0.375 V
Lead Channel Pacing Threshold Amplitude: 0.75 V
Lead Channel Pacing Threshold Pulse Width: 0.4 ms
Lead Channel Pacing Threshold Pulse Width: 0.4 ms
Lead Channel Sensing Intrinsic Amplitude: 15.75 mV
Lead Channel Sensing Intrinsic Amplitude: 15.75 mV
Lead Channel Sensing Intrinsic Amplitude: 3 mV
Lead Channel Sensing Intrinsic Amplitude: 3 mV
Lead Channel Setting Pacing Amplitude: 1.5 V
Lead Channel Setting Pacing Amplitude: 2.5 V
Lead Channel Setting Pacing Pulse Width: 0.4 ms
Lead Channel Setting Sensing Sensitivity: 4 mV

## 2020-04-17 NOTE — Progress Notes (Signed)
Remote pacemaker transmission.   

## 2020-04-18 ENCOUNTER — Encounter (HOSPITAL_COMMUNITY): Payer: Self-pay

## 2020-04-18 ENCOUNTER — Ambulatory Visit (HOSPITAL_COMMUNITY)
Admission: RE | Admit: 2020-04-18 | Discharge: 2020-04-18 | Disposition: A | Discharge status: Home / Self Care | Payer: Medicare Other | Source: Ambulatory Visit | Attending: Gastroenterology | Admitting: Gastroenterology

## 2020-04-18 ENCOUNTER — Other Ambulatory Visit: Payer: Self-pay

## 2020-04-18 DIAGNOSIS — K760 Fatty (change of) liver, not elsewhere classified: Secondary | ICD-10-CM | POA: Diagnosis not present

## 2020-04-18 DIAGNOSIS — R1033 Periumbilical pain: Secondary | ICD-10-CM | POA: Insufficient documentation

## 2020-04-18 MED ORDER — SODIUM CHLORIDE (PF) 0.9 % IJ SOLN
INTRAMUSCULAR | Status: AC
  Filled 2020-04-18: qty 50

## 2020-04-18 MED ORDER — IOHEXOL 300 MG/ML  SOLN
100.0000 mL | Freq: Once | INTRAMUSCULAR | Status: AC | PRN
  Administered 2020-04-18: 100 mL via INTRAVENOUS

## 2020-05-22 ENCOUNTER — Other Ambulatory Visit: Payer: Self-pay | Admitting: Internal Medicine

## 2020-05-22 ENCOUNTER — Other Ambulatory Visit: Payer: Self-pay

## 2020-05-22 ENCOUNTER — Ambulatory Visit
Admission: RE | Admit: 2020-05-22 | Discharge: 2020-05-22 | Disposition: A | Discharge status: Home / Self Care | Payer: Medicare Other | Source: Ambulatory Visit | Attending: Internal Medicine | Admitting: Internal Medicine

## 2020-05-22 DIAGNOSIS — E538 Deficiency of other specified B group vitamins: Secondary | ICD-10-CM | POA: Diagnosis not present

## 2020-05-22 DIAGNOSIS — E119 Type 2 diabetes mellitus without complications: Secondary | ICD-10-CM | POA: Diagnosis not present

## 2020-05-22 DIAGNOSIS — M19011 Primary osteoarthritis, right shoulder: Secondary | ICD-10-CM | POA: Diagnosis not present

## 2020-05-22 DIAGNOSIS — M19012 Primary osteoarthritis, left shoulder: Secondary | ICD-10-CM | POA: Diagnosis not present

## 2020-05-22 DIAGNOSIS — R0609 Other forms of dyspnea: Secondary | ICD-10-CM

## 2020-05-22 DIAGNOSIS — E1165 Type 2 diabetes mellitus with hyperglycemia: Secondary | ICD-10-CM | POA: Diagnosis not present

## 2020-05-22 DIAGNOSIS — E782 Mixed hyperlipidemia: Secondary | ICD-10-CM | POA: Diagnosis not present

## 2020-05-22 DIAGNOSIS — R0902 Hypoxemia: Secondary | ICD-10-CM | POA: Diagnosis not present

## 2020-05-22 DIAGNOSIS — I10 Essential (primary) hypertension: Secondary | ICD-10-CM | POA: Diagnosis not present

## 2020-05-22 DIAGNOSIS — R5383 Other fatigue: Secondary | ICD-10-CM | POA: Diagnosis not present

## 2020-05-22 DIAGNOSIS — R0602 Shortness of breath: Secondary | ICD-10-CM | POA: Diagnosis not present

## 2020-05-28 DIAGNOSIS — R7989 Other specified abnormal findings of blood chemistry: Secondary | ICD-10-CM | POA: Insufficient documentation

## 2020-05-29 ENCOUNTER — Other Ambulatory Visit: Payer: Self-pay | Admitting: Internal Medicine

## 2020-05-29 DIAGNOSIS — R079 Chest pain, unspecified: Secondary | ICD-10-CM | POA: Diagnosis not present

## 2020-05-29 DIAGNOSIS — M19012 Primary osteoarthritis, left shoulder: Secondary | ICD-10-CM | POA: Diagnosis not present

## 2020-05-29 DIAGNOSIS — I6381 Other cerebral infarction due to occlusion or stenosis of small artery: Secondary | ICD-10-CM | POA: Insufficient documentation

## 2020-05-29 DIAGNOSIS — J9611 Chronic respiratory failure with hypoxia: Secondary | ICD-10-CM | POA: Diagnosis not present

## 2020-05-29 DIAGNOSIS — I6529 Occlusion and stenosis of unspecified carotid artery: Secondary | ICD-10-CM | POA: Insufficient documentation

## 2020-05-29 DIAGNOSIS — E042 Nontoxic multinodular goiter: Secondary | ICD-10-CM

## 2020-05-29 DIAGNOSIS — M19011 Primary osteoarthritis, right shoulder: Secondary | ICD-10-CM | POA: Diagnosis not present

## 2020-05-29 DIAGNOSIS — R0609 Other forms of dyspnea: Secondary | ICD-10-CM | POA: Diagnosis not present

## 2020-05-29 DIAGNOSIS — I208 Other forms of angina pectoris: Secondary | ICD-10-CM | POA: Diagnosis not present

## 2020-05-29 DIAGNOSIS — Q791 Other congenital malformations of diaphragm: Secondary | ICD-10-CM | POA: Diagnosis not present

## 2020-06-02 DIAGNOSIS — Z950 Presence of cardiac pacemaker: Secondary | ICD-10-CM | POA: Diagnosis not present

## 2020-06-02 DIAGNOSIS — Z7984 Long term (current) use of oral hypoglycemic drugs: Secondary | ICD-10-CM | POA: Diagnosis not present

## 2020-06-02 DIAGNOSIS — E7800 Pure hypercholesterolemia, unspecified: Secondary | ICD-10-CM | POA: Diagnosis not present

## 2020-06-02 DIAGNOSIS — I10 Essential (primary) hypertension: Secondary | ICD-10-CM | POA: Diagnosis not present

## 2020-06-02 DIAGNOSIS — Z7982 Long term (current) use of aspirin: Secondary | ICD-10-CM | POA: Diagnosis not present

## 2020-06-02 DIAGNOSIS — Z9181 History of falling: Secondary | ICD-10-CM | POA: Diagnosis not present

## 2020-06-02 DIAGNOSIS — R531 Weakness: Secondary | ICD-10-CM | POA: Diagnosis not present

## 2020-06-02 DIAGNOSIS — Z79891 Long term (current) use of opiate analgesic: Secondary | ICD-10-CM | POA: Diagnosis not present

## 2020-06-02 DIAGNOSIS — Z859 Personal history of malignant neoplasm, unspecified: Secondary | ICD-10-CM | POA: Diagnosis not present

## 2020-06-02 DIAGNOSIS — M1990 Unspecified osteoarthritis, unspecified site: Secondary | ICD-10-CM | POA: Diagnosis not present

## 2020-06-02 DIAGNOSIS — I442 Atrioventricular block, complete: Secondary | ICD-10-CM | POA: Diagnosis not present

## 2020-06-02 DIAGNOSIS — Z96653 Presence of artificial knee joint, bilateral: Secondary | ICD-10-CM | POA: Diagnosis not present

## 2020-06-02 DIAGNOSIS — E119 Type 2 diabetes mellitus without complications: Secondary | ICD-10-CM | POA: Diagnosis not present

## 2020-06-02 DIAGNOSIS — F329 Major depressive disorder, single episode, unspecified: Secondary | ICD-10-CM | POA: Diagnosis not present

## 2020-06-05 ENCOUNTER — Ambulatory Visit
Admission: RE | Admit: 2020-06-05 | Discharge: 2020-06-05 | Disposition: A | Discharge status: Home / Self Care | Payer: Medicare Other | Source: Ambulatory Visit | Attending: Internal Medicine | Admitting: Internal Medicine

## 2020-06-05 DIAGNOSIS — E041 Nontoxic single thyroid nodule: Secondary | ICD-10-CM | POA: Diagnosis not present

## 2020-06-05 DIAGNOSIS — E042 Nontoxic multinodular goiter: Secondary | ICD-10-CM

## 2020-06-10 DIAGNOSIS — H16223 Keratoconjunctivitis sicca, not specified as Sjogren's, bilateral: Secondary | ICD-10-CM | POA: Diagnosis not present

## 2020-06-10 DIAGNOSIS — E119 Type 2 diabetes mellitus without complications: Secondary | ICD-10-CM | POA: Diagnosis not present

## 2020-06-10 DIAGNOSIS — H353131 Nonexudative age-related macular degeneration, bilateral, early dry stage: Secondary | ICD-10-CM | POA: Diagnosis not present

## 2020-06-10 DIAGNOSIS — H5213 Myopia, bilateral: Secondary | ICD-10-CM | POA: Diagnosis not present

## 2020-06-10 DIAGNOSIS — H40023 Open angle with borderline findings, high risk, bilateral: Secondary | ICD-10-CM | POA: Diagnosis not present

## 2020-06-10 DIAGNOSIS — H0102A Squamous blepharitis right eye, upper and lower eyelids: Secondary | ICD-10-CM | POA: Diagnosis not present

## 2020-06-10 DIAGNOSIS — Z961 Presence of intraocular lens: Secondary | ICD-10-CM | POA: Diagnosis not present

## 2020-06-10 DIAGNOSIS — H0102B Squamous blepharitis left eye, upper and lower eyelids: Secondary | ICD-10-CM | POA: Diagnosis not present

## 2020-06-11 ENCOUNTER — Encounter: Payer: Medicare Other | Admitting: Student

## 2020-06-11 NOTE — Progress Notes (Deleted)
Electrophysiology Office Note Date: 06/11/2020  ID:  Maria [name], DOB [birth year], MRN [id number]  PCP: Audley Hose, MD Primary Cardiologist: Cristopher Peru, MD Electrophysiologist: Cristopher Peru, MD   CC: Pacemaker follow-up  Maria [name] is a 86 y.o. female seen today for Cristopher Peru, MD for acute visit due to chest pain.  Since last being seen in our clinic the patient reports dyspnea on exertion that at times has been associated with chest tightness. She has not had diaphoresis or nausea. The chest pain has resolved after 5 minutes or less of rest. EKG has been unchanged. She was prescribed NTG by her PCP and asked to see cardiology. Since starting on oxygen she reports ***.  she denies  palpitations, PND, orthopnea, nausea, vomiting, dizziness, syncope, edema, weight gain, or early satiety.  Device History: Medtronic Dual Chamber PPM implanted 2003 for CHB, gen change 2010, gen change 04/2019  Past Medical History:  Diagnosis Date  . Anxiety   . Arthritis   . Cancer ([place])    breast 2015  . Cardiac pacemaker in situ   . Chest pain   . Diabetes mellitus without complication ([place])   . DM type 2 (diabetes mellitus, type 2) ([place])   . Dyslipidemia   . Dyspnea    with exertion  . Family history of adverse reaction to anesthesia    daughter slow to wake up  . Gallstones   . Headache   . Hypertension   . Macular degeneration   . Pulmonary hypertension (H[facility])    PA peak pressure 81 mmHg by 09/2016 echo  . Type II or unspecified type diabetes mellitus without mention of complication, not stated as uncontrolled   . Wears partial dentures    Past Surgical History:  Procedure Laterality Date  . BREAST EXCISIONAL BIOPSY Right 2010  . CATARACT EXTRACTION    . CHOLECYSTECTOMY N/A 01/18/2017   Procedure: LAPAROSCOPIC CHOLECYSTECTOMY;  Surgeon: Ralene Ok, MD;  Location: [facility];  Service: General;  Laterality: N/A;  . IR GENERIC HISTORICAL  10/08/2016   IR PERC  CHOLECYSTOSTOMY 10/08/2016 Greggory Keen, MD [facility]  . IR RADIOLOGIST EVAL & MGMT  11/16/2016  . KNEE SURGERY Bilateral   . MULTIPLE TOOTH EXTRACTIONS    . PACEMAKER INSERTION     Medtronic, [facility] 2003  . PACEMAKER INSERTION    . PPM GENERATOR CHANGEOUT N/A 04/16/2019   Procedure: PPM GENERATOR CHANGEOUT;  Surgeon: Evans Lance, MD;  Location: [facility];  Service: Cardiovascular;  Laterality: N/A;  . TOTAL KNEE ARTHROPLASTY Bilateral     Current Outpatient Medications  Medication Sig Dispense Refill  . amLODipine (NORVASC) 10 MG tablet Take 1 tablet (10 mg total) by mouth daily. 30 tablet 1  . atorvastatin (LIPITOR) 40 MG tablet Take 1 tablet (40 mg total) by mouth daily at 6 PM. (Patient taking differently: Take 40 mg by mouth at bedtime. ) 60 tablet 0  . Blood Glucose Monitoring Suppl (FREESTYLE LITE) DEVI   0  . Doxepin HCl 5 % CREA Apply 1 g topically 4 (four) times daily. For pain    . FREESTYLE LITE test strip   4  . Lancets (FREESTYLE) lancets   4  . Lidocaine 4 % PTCH Place 1 application onto the skin 2 (two) times daily as needed (pain (remove at bedtime)).    Marland Kitchen losartan (COZAAR) 50 MG tablet Take 50 mg by mouth daily.    . meclizine (ANTIVERT) 25 MG tablet  Take 25 mg by mouth 3 (three) times daily as needed for dizziness.    . metFORMIN (GLUCOPHAGE) 500 MG tablet Take 500 mg by mouth 2 (two) times daily with a meal.     . Multiple Vitamins-Minerals (PRESERVISION AREDS 2 PO) Take 1 tablet by mouth daily.    . ondansetron (ZOFRAN ODT) 4 MG disintegrating tablet Take 1 tablet (4 mg total) by mouth every 8 (eight) hours as needed for nausea or vomiting. 20 tablet 0  . polyvinyl alcohol (ARTIFICIAL TEARS) 1.4 % ophthalmic solution Place 1 drop into both eyes daily.     No current facility-administered medications for this visit.    Allergies:   Patient has no known allergies.   Social History: Social History   Socioeconomic History  . Marital status:  Widowed    Spouse name: Not on file  . Number of children: 2  . Years of education: Not on file  . Highest education level: Not on file  Occupational History  . Not on file  Tobacco Use  . Smoking status: Never Smoker  . Smokeless tobacco: Current User    Types: Snuff  Vaping Use  . Vaping Use: Never used  Substance and Sexual Activity  . Alcohol use: No    Alcohol/week: 1.0 standard drink    Types: 1 Cans of beer per week  . Drug use: No  . Sexual activity: Not on file  Other Topics Concern  . Not on file  Social History Narrative   ** Merged History Encounter **       Social Determinants of Health   Financial Resource Strain:   . Difficulty of Paying Living Expenses: Not on file  Food Insecurity:   . Worried About Charity fundraiser in the Last Year: Not on file  . Ran Out of Food in the Last Year: Not on file  Transportation Needs:   . Lack of Transportation (Medical): Not on file  . Lack of Transportation (Non-Medical): Not on file  Physical Activity:   . Days of Exercise per Week: Not on file  . Minutes of Exercise per Session: Not on file  Stress:   . Feeling of Stress : Not on file  Social Connections:   . Frequency of Communication with Friends and Family: Not on file  . Frequency of Social Gatherings with Friends and Family: Not on file  . Attends Religious Services: Not on file  . Active Member of Clubs or Organizations: Not on file  . Attends Archivist Meetings: Not on file  . Marital Status: Not on file  Intimate Partner Violence:   . Fear of Current or Ex-Partner: Not on file  . Emotionally Abused: Not on file  . Physically Abused: Not on file  . Sexually Abused: Not on file    Family History: Family History  Problem Relation Age of Onset  . Colon cancer Neg Hx      Review of Systems: All other systems reviewed and are otherwise negative except as noted above.  Physical Exam: There were no vitals filed for this visit.   GEN-  The patient is well appearing, alert and oriented x 3 today.   HEENT: normocephalic, atraumatic; sclera clear, conjunctiva pink; hearing intact; oropharynx clear; neck supple  Lungs- Clear to ausculation bilaterally, normal work of breathing.  No wheezes, rales, rhonchi Heart- Regular rate and rhythm, no murmurs, rubs or gallops  GI- soft, non-tender, non-distended, bowel sounds present  Extremities- no clubbing or cyanosis.  No edema MS- no significant deformity or atrophy Skin- warm and dry, no rash or lesion; PPM pocket well healed Psych- euthymic mood, full affect Neuro- strength and sensation are intact  PPM Interrogation- reviewed in detail today,  See PACEART report  EKG:  EKG is ordered today. The ekg ordered today shows ***  Recent Labs: 10/17/2019: TSH 0.338 10/18/2019: Magnesium 1.7 04/01/2020: ALT 26; BUN 15; Creatinine, Ser 0.67; Hemoglobin 13.4; Platelets 251; Potassium 4.4; Sodium 136   Wt Readings from Last 3 Encounters:  04/01/20 143 lb (64.9 kg)  10/19/19 139 lb 12.4 oz (63.4 kg)  07/27/19 147 lb (66.7 kg)     Other studies Reviewed: Additional studies/ records that were reviewed today include: Previous EP office notes, Previous remote checks, Most recent labwork.   Assessment and Plan:  1. CHB s/p Medtronic PPM  Normal PPM function See Pace Art report No changes today  2.  Chest pain Echo 2018 with LVEF 50-55%. *** Coronary calcifications previously noted on CT angio 2017   3. Chronic hypoxic respiratory failure Recently started on Oxygen  Current medicines are reviewed at length with the patient today.   The patient does not have concerns regarding her medicines.  The following changes were made today:  {NONE DEFAULTED:18576::"none"}  Labs/ tests ordered today include: *** No orders of the defined types were placed in this encounter.  Disposition:   Follow up with Dr. Lovena Le in 12 months. Sooner with more symptoms or pending work up.    Jacalyn Lefevre, PA-C  06/11/2020 7:52 AM  [facility] [address] [place] [address] [phone] (office) [phone] (fax)

## 2020-06-13 DIAGNOSIS — E119 Type 2 diabetes mellitus without complications: Secondary | ICD-10-CM | POA: Diagnosis not present

## 2020-06-13 DIAGNOSIS — I442 Atrioventricular block, complete: Secondary | ICD-10-CM | POA: Diagnosis not present

## 2020-06-13 DIAGNOSIS — F329 Major depressive disorder, single episode, unspecified: Secondary | ICD-10-CM | POA: Diagnosis not present

## 2020-06-13 DIAGNOSIS — E7800 Pure hypercholesterolemia, unspecified: Secondary | ICD-10-CM | POA: Diagnosis not present

## 2020-06-13 DIAGNOSIS — M1990 Unspecified osteoarthritis, unspecified site: Secondary | ICD-10-CM | POA: Diagnosis not present

## 2020-06-13 DIAGNOSIS — I10 Essential (primary) hypertension: Secondary | ICD-10-CM | POA: Diagnosis not present

## 2020-06-17 DIAGNOSIS — E119 Type 2 diabetes mellitus without complications: Secondary | ICD-10-CM | POA: Diagnosis not present

## 2020-06-17 DIAGNOSIS — M1990 Unspecified osteoarthritis, unspecified site: Secondary | ICD-10-CM | POA: Diagnosis not present

## 2020-06-17 DIAGNOSIS — I10 Essential (primary) hypertension: Secondary | ICD-10-CM | POA: Diagnosis not present

## 2020-06-17 DIAGNOSIS — E7800 Pure hypercholesterolemia, unspecified: Secondary | ICD-10-CM | POA: Diagnosis not present

## 2020-06-17 DIAGNOSIS — F329 Major depressive disorder, single episode, unspecified: Secondary | ICD-10-CM | POA: Diagnosis not present

## 2020-06-17 DIAGNOSIS — I442 Atrioventricular block, complete: Secondary | ICD-10-CM | POA: Diagnosis not present

## 2020-06-19 DIAGNOSIS — I10 Essential (primary) hypertension: Secondary | ICD-10-CM | POA: Diagnosis not present

## 2020-06-19 DIAGNOSIS — M1990 Unspecified osteoarthritis, unspecified site: Secondary | ICD-10-CM | POA: Diagnosis not present

## 2020-06-19 DIAGNOSIS — I442 Atrioventricular block, complete: Secondary | ICD-10-CM | POA: Diagnosis not present

## 2020-06-19 DIAGNOSIS — E7800 Pure hypercholesterolemia, unspecified: Secondary | ICD-10-CM | POA: Diagnosis not present

## 2020-06-19 DIAGNOSIS — E119 Type 2 diabetes mellitus without complications: Secondary | ICD-10-CM | POA: Diagnosis not present

## 2020-06-19 DIAGNOSIS — F329 Major depressive disorder, single episode, unspecified: Secondary | ICD-10-CM | POA: Diagnosis not present

## 2020-06-24 DIAGNOSIS — M160 Bilateral primary osteoarthritis of hip: Secondary | ICD-10-CM | POA: Diagnosis not present

## 2020-06-24 DIAGNOSIS — J9611 Chronic respiratory failure with hypoxia: Secondary | ICD-10-CM | POA: Diagnosis not present

## 2020-06-24 DIAGNOSIS — Z23 Encounter for immunization: Secondary | ICD-10-CM | POA: Diagnosis not present

## 2020-06-24 DIAGNOSIS — I208 Other forms of angina pectoris: Secondary | ICD-10-CM | POA: Diagnosis not present

## 2020-06-24 DIAGNOSIS — B353 Tinea pedis: Secondary | ICD-10-CM | POA: Diagnosis not present

## 2020-06-24 DIAGNOSIS — R0609 Other forms of dyspnea: Secondary | ICD-10-CM | POA: Diagnosis not present

## 2020-06-26 DIAGNOSIS — E119 Type 2 diabetes mellitus without complications: Secondary | ICD-10-CM | POA: Diagnosis not present

## 2020-06-26 DIAGNOSIS — M1990 Unspecified osteoarthritis, unspecified site: Secondary | ICD-10-CM | POA: Diagnosis not present

## 2020-06-26 DIAGNOSIS — F329 Major depressive disorder, single episode, unspecified: Secondary | ICD-10-CM | POA: Diagnosis not present

## 2020-06-26 DIAGNOSIS — I10 Essential (primary) hypertension: Secondary | ICD-10-CM | POA: Diagnosis not present

## 2020-06-26 DIAGNOSIS — E7800 Pure hypercholesterolemia, unspecified: Secondary | ICD-10-CM | POA: Diagnosis not present

## 2020-06-26 DIAGNOSIS — I442 Atrioventricular block, complete: Secondary | ICD-10-CM | POA: Diagnosis not present

## 2020-06-27 ENCOUNTER — Encounter: Payer: Medicare Other | Admitting: Student

## 2020-07-02 ENCOUNTER — Encounter: Payer: Medicare Other | Admitting: Physician Assistant

## 2020-07-02 DIAGNOSIS — I10 Essential (primary) hypertension: Secondary | ICD-10-CM | POA: Diagnosis not present

## 2020-07-02 DIAGNOSIS — Z7984 Long term (current) use of oral hypoglycemic drugs: Secondary | ICD-10-CM | POA: Diagnosis not present

## 2020-07-02 DIAGNOSIS — M1990 Unspecified osteoarthritis, unspecified site: Secondary | ICD-10-CM | POA: Diagnosis not present

## 2020-07-02 DIAGNOSIS — R531 Weakness: Secondary | ICD-10-CM | POA: Diagnosis not present

## 2020-07-02 DIAGNOSIS — Z79891 Long term (current) use of opiate analgesic: Secondary | ICD-10-CM | POA: Diagnosis not present

## 2020-07-02 DIAGNOSIS — Z9181 History of falling: Secondary | ICD-10-CM | POA: Diagnosis not present

## 2020-07-02 DIAGNOSIS — Z7982 Long term (current) use of aspirin: Secondary | ICD-10-CM | POA: Diagnosis not present

## 2020-07-02 DIAGNOSIS — E7800 Pure hypercholesterolemia, unspecified: Secondary | ICD-10-CM | POA: Diagnosis not present

## 2020-07-02 DIAGNOSIS — Z859 Personal history of malignant neoplasm, unspecified: Secondary | ICD-10-CM | POA: Diagnosis not present

## 2020-07-02 DIAGNOSIS — Z96653 Presence of artificial knee joint, bilateral: Secondary | ICD-10-CM | POA: Diagnosis not present

## 2020-07-02 DIAGNOSIS — E119 Type 2 diabetes mellitus without complications: Secondary | ICD-10-CM | POA: Diagnosis not present

## 2020-07-02 DIAGNOSIS — I442 Atrioventricular block, complete: Secondary | ICD-10-CM | POA: Diagnosis not present

## 2020-07-02 DIAGNOSIS — Z950 Presence of cardiac pacemaker: Secondary | ICD-10-CM | POA: Diagnosis not present

## 2020-07-02 DIAGNOSIS — F329 Major depressive disorder, single episode, unspecified: Secondary | ICD-10-CM | POA: Diagnosis not present

## 2020-07-02 NOTE — Progress Notes (Deleted)
Cardiology Office Note Date:  07/02/2020  Patient ID:  Maria [name], Maria [name] April 11, 1924, MRN [id number] PCP:  Audley Hose, MD  Cardiologist/Electrophysiologist: Dr. Lovena Le  ***refresh   Chief Complaint: *** CP  History of Present Illness: Maria [name] is a 86 y.o. female with history of DM, HTN, HLD, p.HTN, breast Ca (2015), CHB w/PPM.  She comes in today to be seen for Dr. Lovena Le, last seen by him July 2020 when she was noted to be ERI and underwent gen change 04/16/2019.  Feb 2021 she had a hospital stay with acute metabolic encephalopathy, poor oral intake and functional decline  She had an ER visit in July 2021 for a mechanical fall hitting her head, imaging was OK, and discharged from the ER, day of doscharged described as AAO x1.  She saw her PMD team 06/24/2020, her hx by this notes chronic hypoxemic resp failure, on home O2 though without portable tank.  She reported DOE associated with chest pressure.  Suspect to be multifactorial noting CT chest recently elevated right hemidiaphragm and atelectasis.  Planned for spirometry, portable O2, felt cardiac evaluation was needed for w/u into chest pressure and DOE, perhaps updating her echo.  Described the pt as looking exhausted.   *** scheduled for eval of CP? Via PMD *** + remotes *** falls ? *** severe p.HTN *** would they want invasive w/u? *** try nitrate *** home PT *** feel better with O2??  Did she get the portable tank yet *** HRs? BB?  Or paced   Device information MDT dual chamber PPM implanted 2003 (remains with these leads), subsequent gen changes 2010 and 04/16/2019   Past Medical History:  Diagnosis Date  . Anxiety   . Arthritis   . Cancer ([place])    breast 2015  . Cardiac pacemaker in situ   . Chest pain   . Diabetes mellitus without complication ([place])   . DM type 2 (diabetes mellitus, type 2) ([place])   . Dyslipidemia   . Dyspnea    with exertion  . Family history of adverse reaction to anesthesia     daughter slow to wake up  . Gallstones   . Headache   . Hypertension   . Macular degeneration   . Pulmonary hypertension ([facility])    PA peak pressure 81 mmHg by 09/2016 echo  . Type II or unspecified type diabetes mellitus without mention of complication, not stated as uncontrolled   . Wears partial dentures     Past Surgical History:  Procedure Laterality Date  . BREAST EXCISIONAL BIOPSY Right 2010  . CATARACT EXTRACTION    . CHOLECYSTECTOMY N/A 01/18/2017   Procedure: LAPAROSCOPIC CHOLECYSTECTOMY;  Surgeon: Ralene Ok, MD;  Location: [place];  Service: General;  Laterality: N/A;  . IR GENERIC HISTORICAL  10/08/2016   IR PERC CHOLECYSTOSTOMY 10/08/2016 Greggory Keen, MD [facility]  . IR RADIOLOGIST EVAL & MGMT  11/16/2016  . KNEE SURGERY Bilateral   . MULTIPLE TOOTH EXTRACTIONS    . PACEMAKER INSERTION     [employer], [facility] 2003  . PACEMAKER INSERTION    . PPM GENERATOR CHANGEOUT N/A 04/16/2019   Procedure: PPM GENERATOR CHANGEOUT;  Surgeon: Evans Lance, MD;  Location: [place] [facility];  Service: Cardiovascular;  Laterality: N/A;  . TOTAL KNEE ARTHROPLASTY Bilateral     Current Outpatient Medications  Medication Sig Dispense Refill  . amLODipine (NORVASC) 10 MG tablet Take 1 tablet (10 mg total) by mouth daily. 30 tablet 1  .  atorvastatin (LIPITOR) 40 MG tablet Take 1 tablet (40 mg total) by mouth daily at 6 PM. (Patient taking differently: Take 40 mg by mouth at bedtime. ) 60 tablet 0  . Blood Glucose Monitoring Suppl (FREESTYLE LITE) DEVI   0  . Doxepin HCl 5 % CREA Apply 1 g topically 4 (four) times daily. For pain    . FREESTYLE LITE test strip   4  . Lancets (FREESTYLE) lancets   4  . Lidocaine 4 % PTCH Place 1 application onto the skin 2 (two) times daily as needed (pain (remove at bedtime)).    Marland Kitchen losartan (COZAAR) 50 MG tablet Take 50 mg by mouth daily.    . meclizine (ANTIVERT) 25 MG tablet Take 25 mg by mouth 3 (three) times daily as needed for dizziness.     . metFORMIN (GLUCOPHAGE) 500 MG tablet Take 500 mg by mouth 2 (two) times daily with a meal.     . Multiple Vitamins-Minerals (PRESERVISION AREDS 2 PO) Take 1 tablet by mouth daily.    . ondansetron (ZOFRAN ODT) 4 MG disintegrating tablet Take 1 tablet (4 mg total) by mouth every 8 (eight) hours as needed for nausea or vomiting. 20 tablet 0  . polyvinyl alcohol (ARTIFICIAL TEARS) 1.4 % ophthalmic solution Place 1 drop into both eyes daily.     No current facility-administered medications for this visit.    Allergies:   Patient has no known allergies.   Social History:  The patient  reports that she has never smoked. Her smokeless tobacco use includes snuff. She reports that she does not drink alcohol and does not use drugs.   Family History:  The patient's family history is not on file.  ROS:  Please see the history of present illness.    All other systems are reviewed and otherwise negative.   PHYSICAL EXAM:  VS:  There were no vitals taken for this visit. BMI: There is no height or weight on file to calculate BMI. Well nourished, well developed, in no acute distress HEENT: normocephalic, atraumatic Neck: no JVD, carotid bruits or masses Cardiac:  *** RRR; no significant murmurs, no rubs, or gallops Lungs:  *** CTA b/l, no wheezing, rhonchi or rales Abd: soft, nontender MS: no deformity or *** atrophy Ext: *** no edema Skin: warm and dry, no rash Neuro:  No gross deficits appreciated Psych: euthymic mood, full affect  *** PPM site is stable, no tethering or discomfort   EKG:  Done today and reviewed by myself shows  ***  Device interrogation done today and reviewed by myself:  ***  10/07/2016; TTE Study Conclusions  - Left ventricle: The cavity size was normal. Wall thickness was  increased in a pattern of mild LVH. Systolic function was normal.  The estimated ejection fraction was in the range of 50% to 55%.  Severe hypokinesis of the apical myocardium.  Hypokinesis of the  anteroseptal myocardium. Doppler parameters are consistent with  abnormal left ventricular relaxation (grade 1 diastolic  dysfunction).  - Ventricular septum: The contour showed systolic flattening. These  changes are consistent with RV pressure overload.  - Mitral valve: There was mild regurgitation.  - Right ventricle: The cavity size was mildly dilated. Wall  thickness was normal.  - Right atrium: The atrium was mildly dilated.  - Tricuspid valve: There was moderate-severe regurgitation.  - Pulmonary arteries: Systolic pressure was severely increased. PA  peak pressure: 81 mm Hg (S).   Recent Labs: 10/17/2019: TSH 0.338 10/18/2019: Magnesium  1.7 04/01/2020: ALT 26; BUN 15; Creatinine, Ser 0.67; Hemoglobin 13.4; Platelets 251; Potassium 4.4; Sodium 136  10/17/2019: Cholesterol 242; HDL 33; LDL Cholesterol 178; Total CHOL/HDL Ratio 7.3; Triglycerides 155; VLDL 31   CrCl cannot be calculated (Patient's most recent lab result is older than the maximum 21 days allowed.).   Wt Readings from Last 3 Encounters:  04/01/20 143 lb (64.9 kg)  10/19/19 139 lb 12.4 oz (63.4 kg)  07/27/19 147 lb (66.7 kg)     Other studies reviewed: Additional studies/records reviewed today include: summarized above  ASSESSMENT AND PLAN:  1. PPM     ***  2. HTN     ***  3. Severe p.HTN 4. Chronic hypoxic respiratory failure     On Home O2     ***  5. DOE, chest pressure     ***  Disposition: F/u with ***  Current medicines are reviewed at length with the patient today.  The patient did not have any concerns regarding medicines.  Venetia Night, PA-C 07/02/2020 7:04 AM     [facility] [place] [address] [phone] (office)  [phone] (fax)

## 2020-07-03 DIAGNOSIS — M1990 Unspecified osteoarthritis, unspecified site: Secondary | ICD-10-CM | POA: Diagnosis not present

## 2020-07-03 DIAGNOSIS — I10 Essential (primary) hypertension: Secondary | ICD-10-CM | POA: Diagnosis not present

## 2020-07-03 DIAGNOSIS — E119 Type 2 diabetes mellitus without complications: Secondary | ICD-10-CM | POA: Diagnosis not present

## 2020-07-03 DIAGNOSIS — E7800 Pure hypercholesterolemia, unspecified: Secondary | ICD-10-CM | POA: Diagnosis not present

## 2020-07-03 DIAGNOSIS — I442 Atrioventricular block, complete: Secondary | ICD-10-CM | POA: Diagnosis not present

## 2020-07-03 DIAGNOSIS — F329 Major depressive disorder, single episode, unspecified: Secondary | ICD-10-CM | POA: Diagnosis not present

## 2020-07-14 DIAGNOSIS — M1990 Unspecified osteoarthritis, unspecified site: Secondary | ICD-10-CM | POA: Diagnosis not present

## 2020-07-14 DIAGNOSIS — I10 Essential (primary) hypertension: Secondary | ICD-10-CM | POA: Diagnosis not present

## 2020-07-14 DIAGNOSIS — E119 Type 2 diabetes mellitus without complications: Secondary | ICD-10-CM | POA: Diagnosis not present

## 2020-07-14 DIAGNOSIS — E7800 Pure hypercholesterolemia, unspecified: Secondary | ICD-10-CM | POA: Diagnosis not present

## 2020-07-14 DIAGNOSIS — I442 Atrioventricular block, complete: Secondary | ICD-10-CM | POA: Diagnosis not present

## 2020-07-14 DIAGNOSIS — F329 Major depressive disorder, single episode, unspecified: Secondary | ICD-10-CM | POA: Diagnosis not present

## 2020-07-15 ENCOUNTER — Ambulatory Visit (INDEPENDENT_AMBULATORY_CARE_PROVIDER_SITE_OTHER): Payer: Medicare Other

## 2020-07-15 DIAGNOSIS — I442 Atrioventricular block, complete: Secondary | ICD-10-CM | POA: Diagnosis not present

## 2020-07-15 LAB — CUP PACEART REMOTE DEVICE CHECK
Battery Remaining Longevity: 125 mo
Battery Voltage: 3.03 V
Brady Statistic AP VP Percent: 14.66 %
Brady Statistic AP VS Percent: 0 %
Brady Statistic AS VP Percent: 83.38 %
Brady Statistic AS VS Percent: 1.96 %
Brady Statistic RA Percent Paced: 15.44 %
Brady Statistic RV Percent Paced: 98.04 %
Date Time Interrogation Session: 20211102040755
Implantable Lead Implant Date: 20030311
Implantable Lead Implant Date: 20030311
Implantable Lead Location: 753859
Implantable Lead Location: 753860
Implantable Lead Model: 4092
Implantable Lead Model: 4592
Implantable Pulse Generator Implant Date: 20200803
Lead Channel Impedance Value: 437 Ohm
Lead Channel Impedance Value: 494 Ohm
Lead Channel Impedance Value: 532 Ohm
Lead Channel Impedance Value: 589 Ohm
Lead Channel Pacing Threshold Amplitude: 0.375 V
Lead Channel Pacing Threshold Amplitude: 0.625 V
Lead Channel Pacing Threshold Pulse Width: 0.4 ms
Lead Channel Pacing Threshold Pulse Width: 0.4 ms
Lead Channel Sensing Intrinsic Amplitude: 2.5 mV
Lead Channel Sensing Intrinsic Amplitude: 2.5 mV
Lead Channel Sensing Intrinsic Amplitude: 22 mV
Lead Channel Sensing Intrinsic Amplitude: 22 mV
Lead Channel Setting Pacing Amplitude: 1.5 V
Lead Channel Setting Pacing Amplitude: 2.5 V
Lead Channel Setting Pacing Pulse Width: 0.4 ms
Lead Channel Setting Sensing Sensitivity: 4 mV

## 2020-07-17 NOTE — Progress Notes (Signed)
Remote pacemaker transmission.   

## 2020-07-29 DIAGNOSIS — I442 Atrioventricular block, complete: Secondary | ICD-10-CM | POA: Diagnosis not present

## 2020-07-29 DIAGNOSIS — E7800 Pure hypercholesterolemia, unspecified: Secondary | ICD-10-CM | POA: Diagnosis not present

## 2020-07-29 DIAGNOSIS — M1990 Unspecified osteoarthritis, unspecified site: Secondary | ICD-10-CM | POA: Diagnosis not present

## 2020-07-29 DIAGNOSIS — F329 Major depressive disorder, single episode, unspecified: Secondary | ICD-10-CM | POA: Diagnosis not present

## 2020-07-29 DIAGNOSIS — I10 Essential (primary) hypertension: Secondary | ICD-10-CM | POA: Diagnosis not present

## 2020-07-29 DIAGNOSIS — E119 Type 2 diabetes mellitus without complications: Secondary | ICD-10-CM | POA: Diagnosis not present

## 2020-07-29 NOTE — Progress Notes (Signed)
Cardiology Office Note Date:  07/30/2020  Patient ID:  Maria, [name] 28-Mar-1924, MRN [id number] PCP:  Audley Hose, MD  Cardiologist/Electrophysiologist: Dr. Lovena Le     Chief Complaint: CP  History of Present Illness: Maria [name] is a 86 y.o. female with history of DM, HTN, HLD, p.HTN, breast Ca (2015), CHB w/PPM.  She comes in today to be seen for Dr. Lovena Le, last seen by him July 2020 when she was noted to be ERI and underwent gen change 04/16/2019.  Feb 2021 she had a hospital stay with acute metabolic encephalopathy, poor oral intake and functional decline  She had an ER visit in July 2021 for a mechanical fall hitting her head, imaging was OK, and discharged from the ER, day of doscharged described as AAO x1.  She saw her PMD team 06/24/2020, her hx by this notes chronic hypoxemic resp failure, on home O2 though without portable tank.  She reported DOE associated with chest pressure.  Suspect to be multifactorial noting CT chest recently elevated right hemidiaphragm and atelectasis.  Planned for spirometry, portable O2, felt cardiac evaluation was needed for w/u into chest pressure and DOE, perhaps updating her echo.  Described the pt as looking exhausted.  She comes today accompanied by her daughter. The patient lives in her home, her grandson sleeps there and her son is there during the day. She is very sedentary, mostly 2/2 terrible knees that give out on her.  She ambulates to the table, bathroom, back to bed, but does nothing more. She tells me that she has not had any  More unusual SOB or chest pain.  Thinks was just a passing thing. With further discussion mentions a "funny pain" left chest locates with finger tip, not positional or exertional, a little difficult to get a good handle on it.  She says had it a couple nights ago, no associate symptoms, just eventually went away. No palpitations, no near syncope or syncope. No rest SOB Uses O2 at home PRN, they are  still waiting for the portable tank.  Device information MDT dual chamber PPM implanted 2003 (remains with these leads), subsequent gen changes 2010 and 04/16/2019   Past Medical History:  Diagnosis Date  . Anxiety   . Arthritis   . Cancer ([place])    breast 2015  . Cardiac pacemaker in situ   . Chest pain   . Diabetes mellitus without complication ([place])   . DM type 2 (diabetes mellitus, type 2) ([place])   . Dyslipidemia   . Dyspnea    with exertion  . Family history of adverse reaction to anesthesia    daughter slow to wake up  . Gallstones   . Headache   . Hypertension   . Macular degeneration   . Pulmonary hypertension ([facility])    PA peak pressure 81 mmHg by 09/2016 echo  . Type II or unspecified type diabetes mellitus without mention of complication, not stated as uncontrolled   . Wears partial dentures     Past Surgical History:  Procedure Laterality Date  . BREAST EXCISIONAL BIOPSY Right 2010  . CATARACT EXTRACTION    . CHOLECYSTECTOMY N/A 01/18/2017   Procedure: LAPAROSCOPIC CHOLECYSTECTOMY;  Surgeon: Ralene Ok, MD;  Location: [place];  Service: General;  Laterality: N/A;  . IR GENERIC HISTORICAL  10/08/2016   IR PERC CHOLECYSTOSTOMY 10/08/2016 Greggory Keen, MD [facility]  . IR RADIOLOGIST EVAL & MGMT  11/16/2016  . KNEE SURGERY Bilateral   . MULTIPLE  TOOTH EXTRACTIONS    . PACEMAKER INSERTION     [employer], [facility] 2003  . PACEMAKER INSERTION    . PPM GENERATOR CHANGEOUT N/A 04/16/2019   Procedure: PPM GENERATOR CHANGEOUT;  Surgeon: Evans Lance, MD;  Location: [place] [facility];  Service: Cardiovascular;  Laterality: N/A;  . TOTAL KNEE ARTHROPLASTY Bilateral     Current Outpatient Medications  Medication Sig Dispense Refill  . amLODipine (NORVASC) 10 MG tablet Take 1 tablet (10 mg total) by mouth daily. 30 tablet 1  . atorvastatin (LIPITOR) 40 MG tablet Take 40 mg by mouth at bedtime.    . Blood Glucose Monitoring Suppl (FREESTYLE LITE) DEVI   0  .  cycloSPORINE (RESTASIS) 0.05 % ophthalmic emulsion Place 1 drop into both eyes daily.     Marland Kitchen donepezil (ARICEPT) 5 MG tablet Take 5 mg by mouth at bedtime.    Marland Kitchen FREESTYLE LITE test strip   4  . Lancets (FREESTYLE) lancets   4  . losartan (COZAAR) 50 MG tablet Take 50 mg by mouth daily.    . meclizine (ANTIVERT) 25 MG tablet Take 25 mg by mouth 3 (three) times daily as needed for dizziness.    . metFORMIN (GLUCOPHAGE) 500 MG tablet Take 500 mg by mouth 2 (two) times daily with a meal.     . Multiple Vitamins-Minerals (PRESERVISION AREDS 2 PO) Take 1 tablet by mouth daily.    . ondansetron (ZOFRAN ODT) 4 MG disintegrating tablet Take 1 tablet (4 mg total) by mouth every 8 (eight) hours as needed for nausea or vomiting. 20 tablet 0  . polyvinyl alcohol (ARTIFICIAL TEARS) 1.4 % ophthalmic solution Place 1 drop into both eyes daily.     No current facility-administered medications for this visit.    Allergies:   Patient has no known allergies.   Social History:  The patient  reports that she has never smoked. Her smokeless tobacco use includes snuff. She reports that she does not drink alcohol and does not use drugs.   Family History:  The patient's family history is not on file.  ROS:  Please see the history of present illness.    All other systems are reviewed and otherwise negative.   PHYSICAL EXAM:  VS:  BP 128/62   Pulse 60   Ht 5\' 4"  (1.626 m)   Wt 144 lb (65.3 kg)   SpO2 96%   BMI 24.72 kg/m  BMI: Body mass index is 24.72 kg/m. Well nourished,thin, elderly, in no acute distress HEENT: normocephalic, atraumatic Neck: no JVD, carotid bruits or masses Cardiac:  RRR; no significant murmurs, no rubs, or gallops Lungs: CTA b/l, no wheezing, rhonchi or rales Abd: soft, nontender MS: no deformity, age appropriate atrophy Ext: no edema Skin: warm and dry, no rash Neuro:  No gross deficits appreciated Psych: euthymic mood, full affect  PPM site is stable, no tethering or  discomfort   EKG:  Done today and reviewed by myself shows  AV paced, no changes from last paced EKG  Device interrogation done today and reviewed by myself:  Battery and lead measurements are good 13 NSVT (since Nov 2020), longest (once) 3 seconds 33 AF episodes are false with PACs, perhaps some far field  10/07/2016; TTE Study Conclusions  - Left ventricle: The cavity size was normal. Wall thickness was  increased in a pattern of mild LVH. Systolic function was normal.  The estimated ejection fraction was in the range of 50% to 55%.  Severe hypokinesis  of the apical myocardium. Hypokinesis of the  anteroseptal myocardium. Doppler parameters are consistent with  abnormal left ventricular relaxation (grade 1 diastolic  dysfunction).  - Ventricular septum: The contour showed systolic flattening. These  changes are consistent with RV pressure overload.  - Mitral valve: There was mild regurgitation.  - Right ventricle: The cavity size was mildly dilated. Wall  thickness was normal.  - Right atrium: The atrium was mildly dilated.  - Tricuspid valve: There was moderate-severe regurgitation.  - Pulmonary arteries: Systolic pressure was severely increased. PA  peak pressure: 81 mm Hg (S).   Recent Labs: 10/17/2019: TSH 0.338 10/18/2019: Magnesium 1.7 04/01/2020: ALT 26; BUN 15; Creatinine, Ser 0.67; Hemoglobin 13.4; Platelets 251; Potassium 4.4; Sodium 136  10/17/2019: Cholesterol 242; HDL 33; LDL Cholesterol 178; Total CHOL/HDL Ratio 7.3; Triglycerides 155; VLDL 31   CrCl cannot be calculated (Patient's most recent lab result is older than the maximum 21 days allowed.).   Wt Readings from Last 3 Encounters:  07/30/20 144 lb (65.3 kg)  04/01/20 143 lb (64.9 kg)  10/19/19 139 lb 12.4 oz (63.4 kg)     Other studies reviewed: Additional studies/records reviewed today include: summarized above  ASSESSMENT AND PLAN:  1. PPM     Intact function, no programming changes  made  2. HTN     looks good  3. Severe p.HTN 4. Chronic hypoxic respiratory failure     On Home O2     C/w primary  5. DOE, chest pressure     I do not get a strong sense that this is coronary, hard to get a good picture of the symptom   We discussed strategies Her PMD gave her a s/l NTG Rx, we discussed how and when to use it I asked if she would want to pursue an ischemic w/u, she was not particularly committal either way I suggested updating her echo and monitoring her symptom. After discussion, will hold off any testing and they will monitor her symptom for any change, escalation, need/use of the s/l NTG Plan for follow up in a a few months   Disposition: F/u with remotes as usual and in clinic as discussed above  Current medicines are reviewed at length with the patient today.  The patient did not have any concerns regarding medicines.  Venetia Night, PA-C 07/30/2020 5:35 PM     [place] [address] [phone] (office)  [phone] (fax)

## 2020-07-30 ENCOUNTER — Other Ambulatory Visit: Payer: Self-pay

## 2020-07-30 ENCOUNTER — Ambulatory Visit (INDEPENDENT_AMBULATORY_CARE_PROVIDER_SITE_OTHER): Payer: Medicare Other | Admitting: Physician Assistant

## 2020-07-30 ENCOUNTER — Encounter: Payer: Self-pay | Admitting: Physician Assistant

## 2020-07-30 VITALS — BP 128/62 | HR 60 | Ht 64.0 in | Wt 144.0 lb

## 2020-07-30 DIAGNOSIS — Z950 Presence of cardiac pacemaker: Secondary | ICD-10-CM | POA: Diagnosis not present

## 2020-07-30 DIAGNOSIS — I10 Essential (primary) hypertension: Secondary | ICD-10-CM | POA: Diagnosis not present

## 2020-07-30 DIAGNOSIS — I272 Pulmonary hypertension, unspecified: Secondary | ICD-10-CM | POA: Diagnosis not present

## 2020-07-30 DIAGNOSIS — I442 Atrioventricular block, complete: Secondary | ICD-10-CM

## 2020-07-30 DIAGNOSIS — R079 Chest pain, unspecified: Secondary | ICD-10-CM | POA: Diagnosis not present

## 2020-07-30 NOTE — Patient Instructions (Signed)
Medication Instructions:   Your physician recommends that you continue on your current medications as directed. Please refer to the Current Medication list given to you today.  *If you need a refill on your cardiac medications before your next appointment, please call your pharmacy*   Lab Work: [place]   If you have labs (blood work) drawn today and your tests are completely normal, you will receive your results only by: Marland Kitchen MyChart Message (if you have MyChart) OR . A paper copy in the mail If you have any lab test that is abnormal or we need to change your treatment, we will call you to review the results.   Testing/Procedures: NONE ORDERED  TODAY   Follow-Up: At [facility], you and your health needs are our priority.  As part of our continuing mission to provide you with exceptional heart care, we have created designated Provider Care Teams.  These Care Teams include your primary Cardiologist (physician) and Advanced Practice Providers (APPs -  Physician Assistants and Nurse Practitioners) who all work together to provide you with the care you need, when you need it.  We recommend signing up for the patient portal called "MyChart".  Sign up information is provided on this After Visit Summary.  MyChart is used to connect with patients for Virtual Visits (Telemedicine).  Patients are able to view lab/test results, encounter notes, upcoming appointments, etc.  Non-urgent messages can be sent to your provider as well.   To learn more about what you can do with MyChart, go to [email or web address].    Your next appointment:   2-3 month(s)  The format for your next appointment:   In Person  Provider:   Cristopher Peru, MD   Other Instructions

## 2020-08-01 IMAGING — CT CT L SPINE W/O CM
1 of 7 series · 5 of 14 positions shown, 7 images · non-contrast
Comparison: Lumbar spine radiograph March 28, 2018 and CT abdomen
and pelvis October 05, 2017

CLINICAL DATA: Chronic low back pain, worse with activity. History
of breast cancer.

EXAM:
CT LUMBAR SPINE WITHOUT CONTRAST
TECHNIQUE: Multidetector CT imaging of the lumbar spine was performed without
intravenous contrast administration. Multiplanar CT image
reconstructions were also generated.

[Series 3: l spine soft · axial · 0.29mm/px · z∈[-246,-108]mm · 5 of 70 slices shown, 7 images]
[im 12/70  soft-tissue]
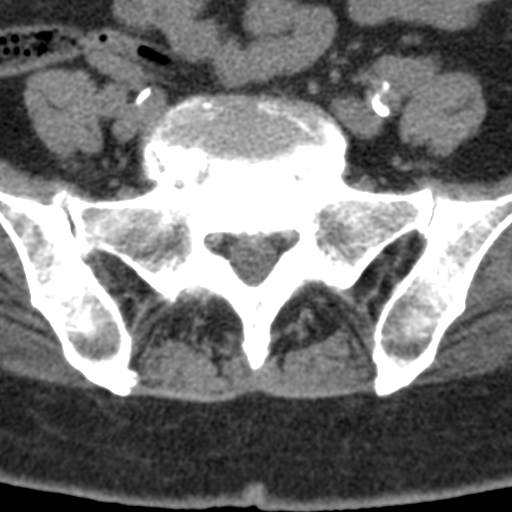
[im 12/70  bone]
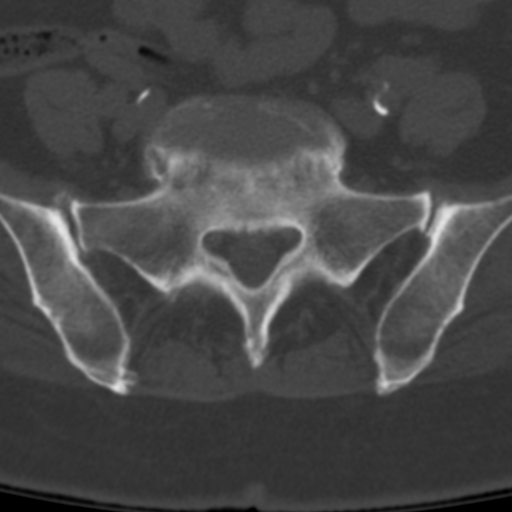
[im 24/70  bone]
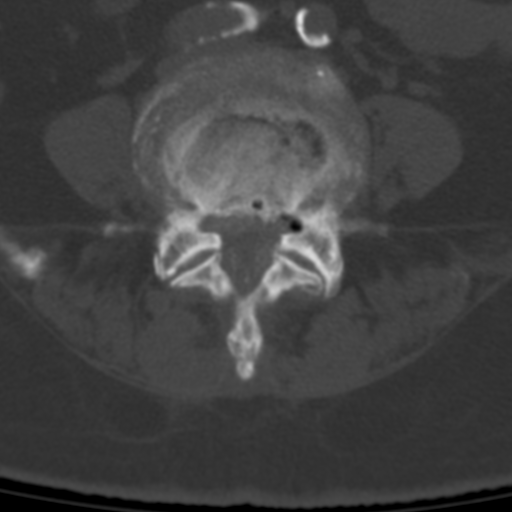
[im 35/70  bone]
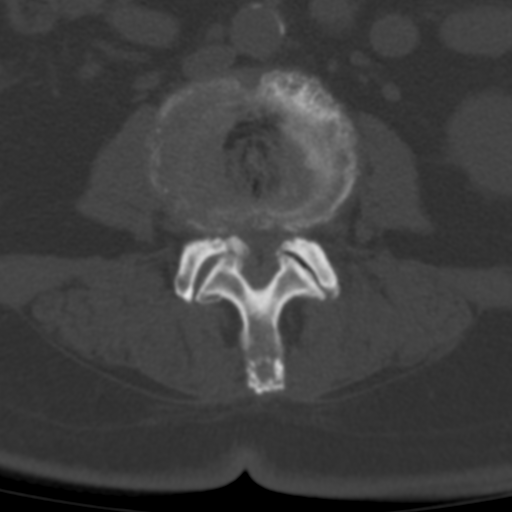
[im 47/70  bone]
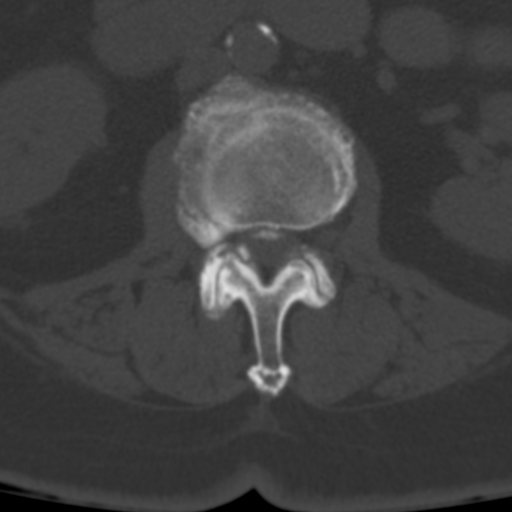
[im 58/70  soft-tissue]
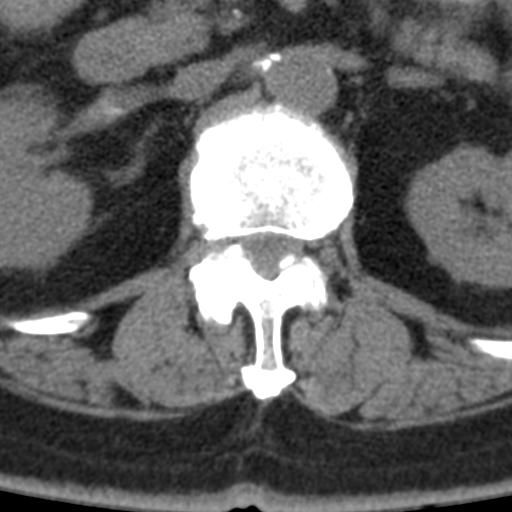
[im 58/70  bone]
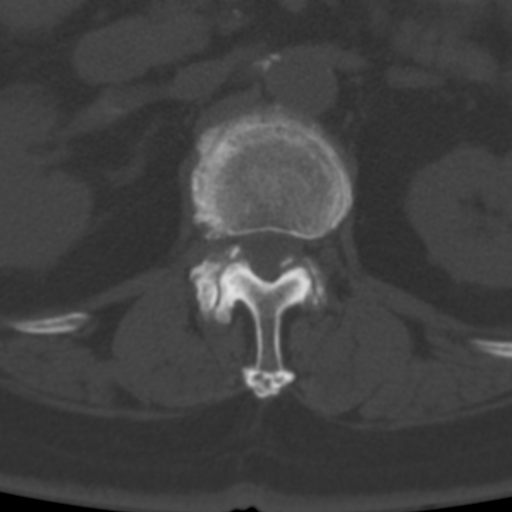

[5 of 14 positions shown; findings below may reference images not displayed]

FINDINGS: SEGMENTATION: For the purposes of this report the last well-formed
intervertebral disc space is reported as L5-S1.

ALIGNMENT: Straightened lumbar lordosis. No malalignment.

VERTEBRAE: Vertebral bodies and posterior elements are intact. Old
nondisplaced RIGHT L3 transverse process fracture. Severe L4-5 disc
height loss with vacuum disc and endplate spurring compatible with
degenerative disc, moderate at L2-3 and mild at L3-4. Osteopenia. No
destructive bony lesions. Mild congenital canal narrowing on the
basis of foreshortened pedicles. Moderate sacroiliac osteoarthrosis.

PARASPINAL AND OTHER SOFT TISSUES: Nonacute. Moderate calcific
atherosclerosis aortoiliac vessels.

DISC LEVELS:

T12-L1: No disc bulge, canal stenosis nor neural foraminal
narrowing. Mild facet arthropathy.

L1-2: Moderate broad-based disc osteophyte complex. Moderate facet
arthropathy and ligamentum flavum redundancy. Moderate canal
stenosis. Severe RIGHT and mild LEFT neural foraminal narrowing.

L2-3: Moderate to large broad-based disc osteophyte complex
asymmetric to the RIGHT which may affect the exited RIGHT L2 nerve.
Moderate facet arthropathy and ligamentum flavum redundancy.
Moderate canal stenosis. Severe RIGHT and mild LEFT neural foraminal
narrowing.

L3-4: Moderate broad-based disc osteophyte complex. Moderate facet
arthropathy and ligamentum flavum redundancy. Moderate to severe
canal stenosis. Moderate bilateral neural foraminal narrowing.

L4-5: Moderate to large broad-based disc osteophyte complex.
Moderate RIGHT and moderate to severe LEFT facet arthropathy and
ligamentum flavum redundancy. Severe canal stenosis. Moderate to
severe RIGHT and severe LEFT neural foraminal narrowing.

L5-S1: Small broad-based disc osteophyte complex. Moderate to severe
facet arthropathy and ligamentum flavum redundancy. No canal
stenosis. Severe RIGHT and moderate LEFT neural foraminal narrowing.
IMPRESSION: 1. Degenerative change of the lumbar spine without fracture or
malalignment.
2. Severe canal stenosis L4-5, moderate to severe canal stenosis
L3-4.
3. Multilevel severe neural foraminal narrowing.

Aortic Atherosclerosis ([id number].J).

## 2020-08-07 ENCOUNTER — Inpatient Hospital Stay (HOSPITAL_COMMUNITY)
Admission: EM | Admit: 2020-08-07 | Discharge: 2020-08-12 | DRG: 871 | Disposition: A | Discharge status: Skilled Nursing Facility | Payer: Medicare Other | Attending: Internal Medicine | Admitting: Internal Medicine

## 2020-08-07 ENCOUNTER — Emergency Department (HOSPITAL_COMMUNITY): Payer: Medicare Other

## 2020-08-07 DIAGNOSIS — Z20822 Contact with and (suspected) exposure to covid-19: Secondary | ICD-10-CM | POA: Diagnosis present

## 2020-08-07 DIAGNOSIS — Z8673 Personal history of transient ischemic attack (TIA), and cerebral infarction without residual deficits: Secondary | ICD-10-CM

## 2020-08-07 DIAGNOSIS — A419 Sepsis, unspecified organism: Secondary | ICD-10-CM | POA: Diagnosis not present

## 2020-08-07 DIAGNOSIS — E119 Type 2 diabetes mellitus without complications: Secondary | ICD-10-CM | POA: Diagnosis present

## 2020-08-07 DIAGNOSIS — F419 Anxiety disorder, unspecified: Secondary | ICD-10-CM | POA: Diagnosis present

## 2020-08-07 DIAGNOSIS — R531 Weakness: Secondary | ICD-10-CM | POA: Diagnosis not present

## 2020-08-07 DIAGNOSIS — T368X5A Adverse effect of other systemic antibiotics, initial encounter: Secondary | ICD-10-CM | POA: Diagnosis not present

## 2020-08-07 DIAGNOSIS — J9611 Chronic respiratory failure with hypoxia: Secondary | ICD-10-CM | POA: Diagnosis present

## 2020-08-07 DIAGNOSIS — L299 Pruritus, unspecified: Secondary | ICD-10-CM | POA: Diagnosis not present

## 2020-08-07 DIAGNOSIS — N179 Acute kidney failure, unspecified: Secondary | ICD-10-CM | POA: Diagnosis not present

## 2020-08-07 DIAGNOSIS — R6520 Severe sepsis without septic shock: Secondary | ICD-10-CM | POA: Diagnosis not present

## 2020-08-07 DIAGNOSIS — N390 Urinary tract infection, site not specified: Secondary | ICD-10-CM | POA: Diagnosis present

## 2020-08-07 DIAGNOSIS — R3911 Hesitancy of micturition: Secondary | ICD-10-CM | POA: Diagnosis not present

## 2020-08-07 DIAGNOSIS — R410 Disorientation, unspecified: Secondary | ICD-10-CM | POA: Diagnosis not present

## 2020-08-07 DIAGNOSIS — Z7984 Long term (current) use of oral hypoglycemic drugs: Secondary | ICD-10-CM

## 2020-08-07 DIAGNOSIS — I10 Essential (primary) hypertension: Secondary | ICD-10-CM | POA: Diagnosis present

## 2020-08-07 DIAGNOSIS — Z79899 Other long term (current) drug therapy: Secondary | ICD-10-CM

## 2020-08-07 DIAGNOSIS — I472 Ventricular tachycardia: Secondary | ICD-10-CM | POA: Diagnosis not present

## 2020-08-07 DIAGNOSIS — M7989 Other specified soft tissue disorders: Secondary | ICD-10-CM | POA: Diagnosis present

## 2020-08-07 DIAGNOSIS — R1310 Dysphagia, unspecified: Secondary | ICD-10-CM | POA: Diagnosis present

## 2020-08-07 DIAGNOSIS — Z853 Personal history of malignant neoplasm of breast: Secondary | ICD-10-CM

## 2020-08-07 DIAGNOSIS — Z9049 Acquired absence of other specified parts of digestive tract: Secondary | ICD-10-CM

## 2020-08-07 DIAGNOSIS — G9341 Metabolic encephalopathy: Secondary | ICD-10-CM | POA: Diagnosis present

## 2020-08-07 DIAGNOSIS — I442 Atrioventricular block, complete: Secondary | ICD-10-CM | POA: Diagnosis present

## 2020-08-07 DIAGNOSIS — R4781 Slurred speech: Secondary | ICD-10-CM | POA: Diagnosis not present

## 2020-08-07 DIAGNOSIS — R9431 Abnormal electrocardiogram [ECG] [EKG]: Secondary | ICD-10-CM | POA: Diagnosis not present

## 2020-08-07 DIAGNOSIS — I272 Pulmonary hypertension, unspecified: Secondary | ICD-10-CM | POA: Diagnosis present

## 2020-08-07 DIAGNOSIS — E785 Hyperlipidemia, unspecified: Secondary | ICD-10-CM | POA: Diagnosis present

## 2020-08-07 DIAGNOSIS — G8929 Other chronic pain: Secondary | ICD-10-CM | POA: Diagnosis present

## 2020-08-07 DIAGNOSIS — Z950 Presence of cardiac pacemaker: Secondary | ICD-10-CM

## 2020-08-07 DIAGNOSIS — G9340 Encephalopathy, unspecified: Secondary | ICD-10-CM | POA: Diagnosis present

## 2020-08-07 DIAGNOSIS — Z66 Do not resuscitate: Secondary | ICD-10-CM | POA: Diagnosis present

## 2020-08-07 DIAGNOSIS — Z9981 Dependence on supplemental oxygen: Secondary | ICD-10-CM

## 2020-08-07 DIAGNOSIS — F17220 Nicotine dependence, chewing tobacco, uncomplicated: Secondary | ICD-10-CM | POA: Diagnosis present

## 2020-08-07 DIAGNOSIS — I517 Cardiomegaly: Secondary | ICD-10-CM | POA: Diagnosis not present

## 2020-08-07 DIAGNOSIS — H3530 Unspecified macular degeneration: Secondary | ICD-10-CM | POA: Diagnosis present

## 2020-08-07 DIAGNOSIS — E1142 Type 2 diabetes mellitus with diabetic polyneuropathy: Secondary | ICD-10-CM

## 2020-08-07 DIAGNOSIS — Z96653 Presence of artificial knee joint, bilateral: Secondary | ICD-10-CM | POA: Diagnosis present

## 2020-08-07 DIAGNOSIS — W06XXXA Fall from bed, initial encounter: Secondary | ICD-10-CM | POA: Diagnosis present

## 2020-08-07 DIAGNOSIS — F0390 Unspecified dementia without behavioral disturbance: Secondary | ICD-10-CM | POA: Diagnosis present

## 2020-08-07 LAB — URINALYSIS, ROUTINE W REFLEX MICROSCOPIC
Bilirubin Urine: NEGATIVE
Glucose, UA: NEGATIVE mg/dL
Ketones, ur: NEGATIVE mg/dL
Nitrite: NEGATIVE
Protein, ur: NEGATIVE mg/dL
Specific Gravity, Urine: 1.005 (ref 1.005–1.030)
pH: 6 (ref 5.0–8.0)

## 2020-08-07 LAB — COMPREHENSIVE METABOLIC PANEL
ALT: 20 U/L (ref 0–44)
AST: 24 U/L (ref 15–41)
Albumin: 4.3 g/dL (ref 3.5–5.0)
Alkaline Phosphatase: 42 U/L (ref 38–126)
Anion gap: 19 — ABNORMAL HIGH (ref 5–15)
BUN: 28 mg/dL — ABNORMAL HIGH (ref 8–23)
CO2: 20 mmol/L — ABNORMAL LOW (ref 22–32)
Calcium: 9.6 mg/dL (ref 8.9–10.3)
Chloride: 100 mmol/L (ref 98–111)
Creatinine, Ser: 2.67 mg/dL — ABNORMAL HIGH (ref 0.44–1.00)
GFR, Estimated: 16 mL/min — ABNORMAL LOW (ref >60)
Glucose, Bld: 115 mg/dL — ABNORMAL HIGH (ref 70–99)
Potassium: 4.8 mmol/L (ref 3.5–5.1)
Sodium: 139 mmol/L (ref 135–145)
Total Bilirubin: 0.9 mg/dL (ref 0.3–1.2)
Total Protein: 7.7 g/dL (ref 6.5–8.1)

## 2020-08-07 LAB — CBC WITH DIFFERENTIAL/PLATELET
Abs Immature Granulocytes: 0.04 10*3/uL (ref 0.00–0.07)
Basophils Absolute: 0 10*3/uL (ref 0.0–0.1)
Basophils Relative: 0 %
Eosinophils Absolute: 0 10*3/uL (ref 0.0–0.5)
Eosinophils Relative: 0 %
HCT: 40.7 % (ref 36.0–46.0)
Hemoglobin: 12.8 g/dL (ref 12.0–15.0)
Immature Granulocytes: 0 %
Lymphocytes Relative: 24 %
Lymphs Abs: 2.6 10*3/uL (ref 0.7–4.0)
MCH: 27.3 pg (ref 26.0–34.0)
MCHC: 31.4 g/dL (ref 30.0–36.0)
MCV: 86.8 fL (ref 80.0–100.0)
Monocytes Absolute: 0.8 10*3/uL (ref 0.1–1.0)
Monocytes Relative: 7 %
Neutro Abs: 7.5 10*3/uL (ref 1.7–7.7)
Neutrophils Relative %: 69 %
Platelets: 268 10*3/uL (ref 150–400)
RBC: 4.69 MIL/uL (ref 3.87–5.11)
RDW: 13.2 % (ref 11.5–15.5)
WBC: 11 10*3/uL — ABNORMAL HIGH (ref 4.0–10.5)
nRBC: 0 % (ref 0.0–0.2)

## 2020-08-07 LAB — TROPONIN I (HIGH SENSITIVITY): Troponin I (High Sensitivity): 25 ng/L — ABNORMAL HIGH (ref <18)

## 2020-08-07 LAB — CBG MONITORING, ED: Glucose-Capillary: 111 mg/dL — ABNORMAL HIGH (ref 70–99)

## 2020-08-07 LAB — LACTIC ACID, PLASMA: Lactic Acid, Venous: 3.6 mmol/L (ref 0.5–1.9)

## 2020-08-07 MED ORDER — SODIUM CHLORIDE 0.9 % IV SOLN
1.0000 g | Freq: Once | INTRAVENOUS | Status: AC
  Administered 2020-08-07: 1 g via INTRAVENOUS
  Filled 2020-08-07: qty 10

## 2020-08-07 MED ORDER — SODIUM CHLORIDE 0.9 % IV BOLUS
500.0000 mL | Freq: Once | INTRAVENOUS | Status: AC
  Administered 2020-08-07: 500 mL via INTRAVENOUS

## 2020-08-07 MED ORDER — SODIUM CHLORIDE 0.9 % IV BOLUS
500.0000 mL | Freq: Once | INTRAVENOUS | Status: AC
  Administered 2020-08-08: 500 mL via INTRAVENOUS

## 2020-08-07 MED ORDER — SODIUM CHLORIDE 0.9 % IV SOLN: INTRAVENOUS | Status: DC

## 2020-08-07 NOTE — H&P (Addendum)
Date: 08/08/2020               [name] Name:  Maria [name] MRN: [id number]  DOB: 30-Apr-1924 Age / Sex: 86 y.o., female   PCP: Audley Hose, MD         Medical Service: Internal Medicine Teaching Service         Attending Physician: Dr. Gilles Chiquito    First Contact: Dr. Sanjuan Dame Pager: [phone]  Second Contact: Dr. Harvie Heck Pager: [phone]       After Hours (After 5p/  First Contact Pager: [phone]  weekends / holidays): Second Contact Pager: [phone]   Chief Complaint: generalized weakness, confusion  History of Present Illness:   Maria [name] is a 86 year old woman with history of diabetes mellitus, hypertension, hyperlipidemia, severe pulmonary HTN, chronic hypoxic respiratory failure, complete heart block with PPM, breast cancer (2015) who presented to Maria ED via EMS with generalized weakness and confusion.  During evaluation at bedside, history difficult from Maria [name] as she was unable to provide details regarding Maria events of Maria day. Maria [name] reports her family called EMS because she has been weaker than normal and this morning had a fall after her grandson helped her to Maria bathroom. She endorses generalized weakness, notes that she has been "sleepy all Maria time," and endorses decreased appetite. Also reports mostly at night around her bedtime she has been seeing a relative who had died and a person who she did not know in her room. Endorses increased urinary frequency over Maria last 2 weeks, denies dysuria or hematuria. Denies fever/chills, cough, congestion, headache, dizziness, chest pain, shortness of breath, abdominal pain. Denies vomiting or diarrhea, notes occasional nausea.   Spoke with [name]'s daughter over Maria phone. Maria daughter cooks, shops, and manages Maria finances for Maria [name]. Her son, Maria [name]'s grandson, sleeps at Maria [name]'s house at night, and Maria [name]'s son visits every morning. Daughter reports Maria [name] has been  sundowning, acting confused, and having difficulty sleeping for Maria last couple of evenings, yesterday evening said someone had poured her medicines out, and also said someone she didn't know was in Maria house. Reports Maria [name] has said she is weak in her legs such that Maria [name]'s son has been carrying her to Maria bathroom instead of Maria [name] using her walker independently, which is her baseline. States earlier in Maria evening prior to presenting at Maria ED, Maria brother took Maria [name] to Maria bathroom, and shortly thereafter Maria [name] tried to get up and go on her own and slid out of Maria bed. Did not hit or head or lose consciousness. Daughter confirms Maria above ROS. Reports Maria only recent medication change was Maria addition of donepazil 2-3 months ago by one of Maria home health nurses to "help with mild dementia." On chart review, Maria son's girlfriend reported to Maria EDP that Maria [name] has had multiple BM in her bed to due weakness.   Maria daughter notes that Maria [name] had a similar but worse presentation in Feb 2021 requiring hospitalization and SNF placement. [name] has had [facility] PT services which are almost completed. On chart review, [name] was admitted 10/16/19-10/19/19 at [facility] with acute encephalopathy, dehydration, and prior CVA found on CTH. There are encounters with UTI with her PCP which are not viewable.   ED Course: Afebrile, RR 19-30, P 90s, BP 120s-150s/70s-90s, O2 sat >95% on RA. CBC with mild leukocytosis (11), hgb 12.8, otherwise wnl.  CMP with bicarb 20, AG 19, BUN/Cr/eGFR 28/2.67/16 (baseline Cr ~0.6-0.9), otherwise unremarkable. Lactic acid 3.6. CK pending. Trop 25>24. RPP pending. Urinalysis significant for SG 1.005, moderate Hgb dipstick with 11-20 RBCs, moderate leukocytes, 11-20 WBC, rare bacteria, and 0-5 squamous cells. CTH wo contrast demonstrated stable atrophy and white matter disease, remote lacunar infarcts of Maria L internal capsule and R thalamus. CXR with  boderline to mild cardiomegaly but no active airspace disease. EKG demonstrates AV-paced NSR. Given 1.5 cc bolus of NS and started on mIVF. Started on ceftriaxone.   Meds:  Amlodipine 10 mg daily Losartan 50 mg daily Aspirin 81 mg daily Donepezil 5 mg daily Lipitor 40 mg daily Metformin 500 mg twice daily Multivitamin daily Ondansetron 4 mg every 8 hours PRN Meclizine 25 mg three times daily PRN   Social History: Lives in a single story home. Grandson stays with her at night, and son visits daily. Daughter manages her bills, groceries, cooking, bathing, doctor's appointments. [name] uses a walker and is able to dress and feed herself but does not bathe herself. From [facility] originally. Used to work in tobacco farming. No history of cigarette smoking however uses snuff daily. Has a beer occasionally.  Family History: Mother had ovarian cancer. Father had prostate cancer.   Allergies: Allergies as of 08/07/2020  . (No Known Allergies)   Past Medical History:  Diagnosis Date  . Anxiety   . Arthritis   . Cancer ([place])    breast 2015  . Cardiac pacemaker in situ   . Chest pain   . Diabetes mellitus without complication ([place])   . DM type 2 (diabetes mellitus, type 2) ([place])   . Dyslipidemia   . Dyspnea    with exertion  . Family history of adverse reaction to anesthesia    daughter slow to wake up  . Gallstones   . Headache   . Hypertension   . Macular degeneration   . Pulmonary hypertension (HCC)    PA peak pressure 81 mmHg by 09/2016 echo  . Type II or unspecified type diabetes mellitus without mention of complication, not stated as uncontrolled   . Wears partial dentures    Review of Systems: A complete ROS was negative except as per HPI.   Physical Exam: Blood pressure (!) 159/85, pulse 95, temperature 98.4 F (36.9 C), temperature source Rectal, resp. rate (!) 23, SpO2 98 %. Constitutional: well-appearing woman lying in bed, in no acute distress, appears younger  than stated age HENT: normocephalic atraumatic, mucous membranes moist, quarter-sized soft-appearing mass in Maria middle of Maria palate Eyes: conjunctiva non-erythematous Neck: supple Cardiovascular: regular rate and rhythm, no m/r/g Pulmonary/Chest: normal work of breathing on room air, lungs clear to auscultation bilaterally except faint crackles at bilateral bases Abdominal: soft, non-distended, guarding and tenderness to palpation suprapubically, no CVA tenderness MSK: normal bulk and tone Neurological: alert & oriented to person and place (knew she was at a hospital but not [facility] specifically), time (said year is [place] but knows Barbette Or is president and month is November); PERRL; speech is slightly dysarthric; no dysmetria with finger-to-nose; no pronator drift; 5/5 strength in bilateral upper extremities, 3-4/5 strength with hip flexion, 5/5 strength with dorsi and plantar flexion; sensation grossly intact; some errors in saying months of Maria year backwards Skin: warm and dry Psych: normal mood and affect  Labs: CBC    Component Value Date/Time   WBC 11.0 (H) 08/07/2020 2052   RBC 4.69 08/07/2020 2052   HGB 12.8  08/07/2020 2052   HGB 13.1 04/13/2019 1202   HCT 40.7 08/07/2020 2052   HCT 41.1 04/13/2019 1202   PLT 268 08/07/2020 2052   PLT 232 04/13/2019 1202   MCV 86.8 08/07/2020 2052   MCV 89 04/13/2019 1202   MCH 27.3 08/07/2020 2052   MCHC 31.4 08/07/2020 2052   RDW 13.2 08/07/2020 2052   RDW 13.1 04/13/2019 1202   LYMPHSABS 2.6 08/07/2020 2052   LYMPHSABS 2.4 04/13/2019 1202   MONOABS 0.8 08/07/2020 2052   EOSABS 0.0 08/07/2020 2052   EOSABS 0.0 04/13/2019 1202   BASOSABS 0.0 08/07/2020 2052   BASOSABS 0.0 04/13/2019 1202     CMP     Component Value Date/Time   NA 139 08/07/2020 2052   NA 141 04/13/2019 1202   K 4.8 08/07/2020 2052   CL 100 08/07/2020 2052   CO2 20 (L) 08/07/2020 2052   GLUCOSE 115 (H) 08/07/2020 2052   BUN 28 (H) 08/07/2020 2052   BUN 21  04/13/2019 1202   CREATININE 2.67 (H) 08/07/2020 2052   CALCIUM 9.6 08/07/2020 2052   PROT 7.7 08/07/2020 2052   ALBUMIN 4.3 08/07/2020 2052   AST 24 08/07/2020 2052   ALT 20 08/07/2020 2052   ALKPHOS 42 08/07/2020 2052   BILITOT 0.9 08/07/2020 2052   GFRNONAA 16 (L) 08/07/2020 2052   GFRAA >60 04/01/2020 1700    Imaging: CT Head Wo Contrast  Result Date: 08/07/2020 CLINICAL DATA:  Right leg weakness.  Slurred speech. EXAM: CT HEAD WITHOUT CONTRAST TECHNIQUE: Contiguous axial images were obtained from Maria base of Maria skull through Maria vertex without intravenous contrast. COMPARISON:  None. FINDINGS: Brain: Moderate atrophy and white matter changes are stable. Remote lacunar infarcts are present in Maria left internal capsule and right thalamus. White matter changes extend into Maria brainstem. Maria ventricles are proportionate to Maria degree of atrophy. No significant extraaxial fluid collection is present. Cerebellum is unremarkable. Vascular: Atherosclerotic changes are present within Maria cavernous internal carotid arteries bilaterally. No hyperdense vessel is present. Skull: Calvarium is intact. No focal lytic or blastic lesions are present. No significant extracranial soft tissue lesion is present. Sinuses/Orbits: Maria paranasal sinuses and mastoid air cells are clear. Bilateral lens replacements are noted. Globes and orbits are otherwise unremarkable. IMPRESSION: 1. Stable atrophy and white matter disease. This likely reflects Maria sequela of chronic microvascular ischemia. 2. Remote lacunar infarcts of Maria left internal capsule and right thalamus. 3. No acute intracranial abnormality or significant interval change. Electronically Signed   By: San Morelle M.D.   On: 08/07/2020 21:45   DG Chest Portable 1 View  Result Date: 08/07/2020 CLINICAL DATA:  Weakness EXAM: PORTABLE CHEST 1 VIEW COMPARISON:  05/22/2020 FINDINGS: Left-sided pacing device as before. Chronic elevation right diaphragm.  Borderline to mild cardiomegaly with aortic atherosclerosis. No consolidation or effusion. IMPRESSION: No active disease. Borderline to mild cardiomegaly. Electronically Signed   By: Donavan Foil M.D.   On: 08/07/2020 20:34    EKG: personally reviewed my interpretation is AV-paced, normal sinus rhythm  Assessment & Plan by Problem: Active Problems:   Severe sepsis [facility])   Maria [name] is a 86 year old woman with history of diabetes mellitus, hypertension, hyperlipidemia, severe pulmonary HTN, chronic hypoxic respiratory failure, complete heart block with PPM, breast cancer (2015) who presented to Maria ED with generalized weakness and admitted for sepsis.  Severe sepsis Acute encephalopathy Urinary tract infection Generalized weakness [name] presents with 2-day history of weakness, confusion, increased urinary frequency. On  exam, A&Ox2 with impaired attention and suprapubic tenderness. Otherwise neurologically intact. She is hemodynamically stable but meets criteria for severe sepsis given increased RR, P>90, urinalysis suggestive of UTI with pyuria and hematuria, lactic acidosis. CTH unrevealing except for evidence of remote CVA. Suspect confusion secondary to infection/sepsis especially given [name]'s mild dementia and imaging suggestive of prior CVA. S/p fluid resuscitation in Maria ED and initiation of ceftriaxone. Will continue maintenance fluids and antibiotic treatment. Urine and blood cultures pending. - Continue IVF - continue ceftriaxone, start vancomycin per pharmacy - f/u CK, repeat lactic acid - f/u urine and blood cultures - Delirium precautions - Fall precautions - AM CBC - Consider PT/OT eval this admission  Acute kidney injury BUN/Cr/eGFR 28/2.67/16, baseline Cr ~0.6-0.9. Suspect pre-renal in setting of dehydration and sepsis vs ATN in setting of sepsis with nephrotoxic mediations, losartan and metformin. Continue fluid resuscitation. Lower suspicion for post-renal  etiologies, will obtain renal ultrasound.  - s/p NS boluses - mIVF - Renal ultrasound - Consider FENa if creatinine does not improve with fluid resuscitation  Diabetes mellitus On metformin 500 mg twice daily at home. A1c 6.5% 9 months ago. - Hold home metformin in s/o AKI - SSI  Hypertension On admission, BP 120s-150s/70s-90s. On home amlodipine 10 mg daily and losartan 50 mg daily. - Hold home antihypertensives in setting of sepsis and AKI  Hyperlipidemia - Continue home atorvastatin 40 mg daily  History of dysphagia Per chart review, [name] on mechanical soft diet during last hospitalization.  - NPO until SLP evaluation  Chronic hypoxic respiratory failure Severe pulmonary hypertension Per most recent PCP note in Care Everywhere from 06/24/20, [name] on home oxygen with exertion. On room air currently with O2 sats >95%.  Mass on palate Quarter-sized soft-appearing mass in Maria middle of Maria palate noted on exam. Given [name]'s history of snuff use, consider outpatient ENT evaluation.   Diet: Carb-Modified VTE: Heparin IVF: NS @ 125 cc/hr Code: Partial: CPR, DNI  Prior to Admission Living Arrangement: Home Anticipated Discharge Location: Home  Dispo: Admit [name] to Inpatient with expected length of stay greater than 2 midnights.  Signed: Alexandria Lodge, MD PGY-1 Internal Medicine Teaching Service Pager: [phone] 08/08/2020

## 2020-08-07 NOTE — ED Provider Notes (Signed)
[facility] EMERGENCY DEPARTMENT Provider Note   CSN: [id number] Arrival date & time: 08/07/20  1934  LEVEL 5 CAVEAT - ALTERED MENTAL STATUS  History Chief Complaint  Patient presents with  . Extremity Weakness    Maria [name] is a 86 y.o. female.  HPI 86 year old female presents from home via EMS for weakness.  History is difficult from the patient given some slurred speech.  However she indicates that she fell near the side of her bed because of weakness in her legs.  She feels weak all over.  Son's girlfriend is at the bedside and states that the patient has been weak since yesterday and seems confused.  No known fevers.  No obvious vomiting or diarrhea but she has had numerous bowel movements in her bed because she was too weak to get up.  She is chronically weak requiring a walker but this is worse than typical.  Seems confused.  Otherwise no focal weakness.  Patient has chronic back pain but no new back pain.  Past Medical History:  Diagnosis Date  . Anxiety   . Arthritis   . Cancer ([place])    breast 2015  . Cardiac pacemaker in situ   . Chest pain   . Diabetes mellitus without complication ([place])   . DM type 2 (diabetes mellitus, type 2) ([place])   . Dyslipidemia   . Dyspnea    with exertion  . Family history of adverse reaction to anesthesia    daughter slow to wake up  . Gallstones   . Headache   . Hypertension   . Macular degeneration   . Pulmonary hypertension ([facility])    PA peak pressure 81 mmHg by 09/2016 echo  . Type II or unspecified type diabetes mellitus without mention of complication, not stated as uncontrolled   . Wears partial dentures     Patient Active Problem List   Diagnosis Date Noted  . Elevated lactic acid level 10/17/2019  . Generalized weakness 10/17/2019  . Hypokalemia 10/17/2019  . Dehydration 10/17/2019  . Acute encephalopathy 10/16/2019  . Heart block AV complete ([place]) 04/13/2019  . Acute acalculous cholecystitis  10/07/2016  . Elevated troponin 10/07/2016  . Lactic acidosis 10/07/2016  . Sinus tachycardia 10/07/2016  . Dyslipidemia 06/05/2013  . Cardiac pacemaker 05/30/2013  . DM type 2 (diabetes mellitus, type 2) ([place]) 05/21/2013  . Chest pain 05/20/2013  . HTN (hypertension) 05/20/2013    Past Surgical History:  Procedure Laterality Date  . BREAST EXCISIONAL BIOPSY Right 2010  . CATARACT EXTRACTION    . CHOLECYSTECTOMY N/A 01/18/2017   Procedure: LAPAROSCOPIC CHOLECYSTECTOMY;  Surgeon: Ralene Ok, MD;  Location: [facility];  Service: General;  Laterality: N/A;  . IR GENERIC HISTORICAL  10/08/2016   IR PERC CHOLECYSTOSTOMY 10/08/2016 Greggory Keen, MD [facility]-INTER[facility]  . IR RADIOLOGIST EVAL & MGMT  11/16/2016  . KNEE SURGERY Bilateral   . MULTIPLE TOOTH EXTRACTIONS    . PACEMAKER INSERTION     [employer], [facility] 2003  . PACEMAKER INSERTION    . PPM GENERATOR CHANGEOUT N/A 04/16/2019   Procedure: PPM GENERATOR CHANGEOUT;  Surgeon: Evans Lance, MD;  Location: [facility] CV LAB;  Service: Cardiovascular;  Laterality: N/A;  . TOTAL KNEE ARTHROPLASTY Bilateral      OB History    Gravida  0   Para  0   Term  0   Preterm  0   AB  0   Living  SAB  0   TAB  0   Ectopic  0   Multiple      Live Births              Family History  Problem Relation Age of Onset  . Colon cancer Neg Hx     Social History   Tobacco Use  . Smoking status: Never Smoker  . Smokeless tobacco: Current User    Types: Snuff  Vaping Use  . Vaping Use: Never used  Substance Use Topics  . Alcohol use: No    Alcohol/week: 1.0 standard drink    Types: 1 Cans of beer per week  . Drug use: No    Home Medications Prior to Admission medications   Medication Sig Start Date End Date Taking? Authorizing Provider  amLODipine (NORVASC) 10 MG tablet Take 1 tablet (10 mg total) by mouth daily. 10/20/19 07/30/20  Kayleen Memos, DO  atorvastatin (LIPITOR) 40 MG tablet Take 40 mg by mouth at  bedtime.    [provider]  Blood Glucose Monitoring Suppl (FREESTYLE LITE) DEVI  08/28/14   [provider]  cycloSPORINE (RESTASIS) 0.05 % ophthalmic emulsion Place 1 drop into both eyes daily.     [provider]  donepezil (ARICEPT) 5 MG tablet Take 5 mg by mouth at bedtime.    [provider]  FREESTYLE LITE test strip  08/28/14   [provider]  Lancets (FREESTYLE) lancets  08/28/14   [provider]  losartan (COZAAR) 50 MG tablet Take 50 mg by mouth daily. 12/04/18   [provider]  meclizine (ANTIVERT) 25 MG tablet Take 25 mg by mouth 3 (three) times daily as needed for dizziness.    [provider]  metFORMIN (GLUCOPHAGE) 500 MG tablet Take 500 mg by mouth 2 (two) times daily with a meal.  09/09/14   [provider]  Multiple Vitamins-Minerals (PRESERVISION AREDS 2 PO) Take 1 tablet by mouth daily.    [provider]  ondansetron (ZOFRAN ODT) 4 MG disintegrating tablet Take 1 tablet (4 mg total) by mouth every 8 (eight) hours as needed for nausea or vomiting. 12/19/18   Darlin Drop P, PA-C  polyvinyl alcohol (ARTIFICIAL TEARS) 1.4 % ophthalmic solution Place 1 drop into both eyes daily.    [provider]    Allergies    Patient has no known allergies.  Review of Systems   Review of Systems  Unable to perform ROS: Mental status change    Physical Exam Updated Vital Signs BP (!) 153/93 (BP Location: Right Arm)   Pulse 95   Temp 98.4 F (36.9 C) (Rectal)   Resp 19   SpO2 98%   Physical Exam Vitals and nursing note reviewed.  Constitutional:      Appearance: She is well-developed. She is not diaphoretic.  HENT:     Head: Normocephalic and atraumatic.     Right Ear: External ear normal.     Left Ear: External ear normal.     Nose: Nose normal.  Eyes:     General:        Right eye: No discharge.        Left eye: No discharge.     Extraocular Movements: Extraocular  movements intact.     Pupils: Pupils are equal, round, and reactive to light.  Cardiovascular:     Rate and Rhythm: Normal rate and regular rhythm.     Heart sounds: Normal heart sounds.  Pulmonary:  Effort: Pulmonary effort is normal.     Breath sounds: Normal breath sounds.  Abdominal:     Palpations: Abdomen is soft.     Tenderness: There is no abdominal tenderness.  Musculoskeletal:     Right lower leg: No edema.     Left lower leg: No edema.  Skin:    General: Skin is warm and dry.  Neurological:     Mental Status: She is alert.     Comments: Awake, alert, oriented to person and month, disoriented to year. Slurred speech currently (patient tells me she feels her speech is normal). 5/5 strength in BUE. 4/5 strength in BLE.   Psychiatric:        Mood and Affect: Mood is not anxious.     ED Results / Procedures / Treatments   Labs (all labs ordered are listed, but only abnormal results are displayed) Labs Reviewed  COMPREHENSIVE METABOLIC PANEL - Abnormal; Notable for the following components:      Result Value   CO2 20 (*)    Glucose, Bld 115 (*)    BUN 28 (*)    Creatinine, Ser 2.67 (*)    GFR, Estimated 16 (*)    Anion gap 19 (*)    All other components within normal limits  LACTIC ACID, PLASMA - Abnormal; Notable for the following components:   Lactic Acid, Venous 3.6 (*)    All other components within normal limits  CBC WITH DIFFERENTIAL/PLATELET - Abnormal; Notable for the following components:   WBC 11.0 (*)    All other components within normal limits  URINALYSIS, ROUTINE W REFLEX MICROSCOPIC - Abnormal; Notable for the following components:   Color, Urine STRAW (*)    Hgb urine dipstick MODERATE (*)    Leukocytes,Ua MODERATE (*)    Bacteria, UA RARE (*)    All other components within normal limits  CBG MONITORING, ED - Abnormal; Notable for the following components:   Glucose-Capillary 111 (*)    All other components within normal limits  TROPONIN I  (HIGH SENSITIVITY) - Abnormal; Notable for the following components:   Troponin I (High Sensitivity) 25 (*)    All other components within normal limits  URINE CULTURE  RESP PANEL BY RT-PCR (FLU A&B, COVID) ARPGX2  LACTIC ACID, PLASMA  CK  TROPONIN I (HIGH SENSITIVITY)    EKG EKG Interpretation  Date/Time:  Thursday August 07 2020 20:29:24 EST Ventricular Rate:  93 PR Interval:    QRS Duration: 124 QT Interval:  401 QTC Calculation: 499 R Axis:   -75 Text Interpretation: Sinus rhythm Multiple premature complexes, vent & supraven Nonspecific IVCD with LAD Anterolateral infarct, old similar to Feb 2019 Confirmed by Sherwood Gambler [phone]) on 08/07/2020 8:40:07 PM   Radiology CT Head Wo Contrast  Result Date: 08/07/2020 CLINICAL DATA:  Right leg weakness.  Slurred speech. EXAM: CT HEAD WITHOUT CONTRAST TECHNIQUE: Contiguous axial images were obtained from the base of the skull through the vertex without intravenous contrast. COMPARISON:  None. FINDINGS: Brain: Moderate atrophy and white matter changes are stable. Remote lacunar infarcts are present in the left internal capsule and right thalamus. White matter changes extend into the brainstem. The ventricles are proportionate to the degree of atrophy. No significant extraaxial fluid collection is present. Cerebellum is unremarkable. Vascular: Atherosclerotic changes are present within the cavernous internal carotid arteries bilaterally. No hyperdense vessel is present. Skull: Calvarium is intact. No focal lytic or blastic lesions are present. No significant extracranial soft tissue lesion is present.  Sinuses/Orbits: The paranasal sinuses and mastoid air cells are clear. Bilateral lens replacements are noted. Globes and orbits are otherwise unremarkable. IMPRESSION: 1. Stable atrophy and white matter disease. This likely reflects the sequela of chronic microvascular ischemia. 2. Remote lacunar infarcts of the left internal capsule and right  thalamus. 3. No acute intracranial abnormality or significant interval change. Electronically Signed   By: San Morelle M.D.   On: 08/07/2020 21:45   DG Chest Portable 1 View  Result Date: 08/07/2020 CLINICAL DATA:  Weakness EXAM: PORTABLE CHEST 1 VIEW COMPARISON:  05/22/2020 FINDINGS: Left-sided pacing device as before. Chronic elevation right diaphragm. Borderline to mild cardiomegaly with aortic atherosclerosis. No consolidation or effusion. IMPRESSION: No active disease. Borderline to mild cardiomegaly. Electronically Signed   By: Donavan Foil M.D.   On: 08/07/2020 20:34    Procedures .Critical Care Performed by: Sherwood Gambler, MD Authorized by: Sherwood Gambler, MD   Critical care provider statement:    Critical care time (minutes):  30   Critical care time was exclusive of:  Separately billable procedures and treating other patients   Critical care was necessary to treat or prevent imminent or life-threatening deterioration of the following conditions:  Sepsis and renal failure   Critical care was time spent personally by me on the following activities:  Discussions with consultants, evaluation of patient's response to treatment, examination of patient, ordering and performing treatments and interventions, ordering and review of laboratory studies, ordering and review of radiographic studies, pulse oximetry, re-evaluation of patient's condition, obtaining history from patient or surrogate and review of old charts   (including critical care time)  Medications Ordered in ED Medications  0.9 %  sodium chloride infusion ( Intravenous New Bag/Given 08/07/20 2237)  sodium chloride 0.9 % bolus 500 mL (has no administration in time range)  cefTRIAXone (ROCEPHIN) 1 g in sodium chloride 0.9 % 100 mL IVPB (has no administration in time range)  sodium chloride 0.9 % bolus 500 mL (0 mLs Intravenous Stopped 08/07/20 2213)  sodium chloride 0.9 % bolus 500 mL (500 mLs Intravenous New  Bag/Given 08/07/20 2240)    ED Course  I have reviewed the triage vital signs and the nursing notes.  Pertinent labs & imaging results that were available during my care of the patient were reviewed by me and considered in my medical decision making (see chart for details).    MDM Rules/Calculators/A&P                          Patient with generalized weakness.  Well outside of the code stroke window.  Seems to be a more generalized and nonfocal weakness anyway.  Found to have UTI and acute kidney injury.  Her lactic acid screening for sepsis was positive at 3.4.  She will be given IV fluids for the AKI and lactate.  Given IV Rocephin for the urine.  No other significant findings to suggest another source of infection.  She is otherwise not in shock and is not febrile.  Discussed updates with the son's girlfriend at the bedside.  Will admit to internal medicine teaching service. Final Clinical Impression(s) / ED Diagnoses Final diagnoses:  Severe sepsis ([place])  Acute UTI (urinary tract infection)  Acute kidney injury [facility])    Rx / DC Orders ED Discharge Orders    None       Sherwood Gambler, MD 08/07/20 2319

## 2020-08-07 NOTE — ED Triage Notes (Signed)
Pt BIB [facility] for right leg weakness and being found on floor next to bed. EMS reports that pt states she did not fall.  EMS reports slurred speech, baseline unknown. LKW-unknown.  Leg weakness noted by grandson around  13:30. Daughter was last one at residence, possibly this morning. Phone number for daughter. [phone]

## 2020-08-08 ENCOUNTER — Inpatient Hospital Stay (HOSPITAL_COMMUNITY): Payer: Medicare Other

## 2020-08-08 ENCOUNTER — Other Ambulatory Visit: Payer: Self-pay

## 2020-08-08 DIAGNOSIS — E119 Type 2 diabetes mellitus without complications: Secondary | ICD-10-CM | POA: Diagnosis present

## 2020-08-08 DIAGNOSIS — G9340 Encephalopathy, unspecified: Secondary | ICD-10-CM | POA: Diagnosis not present

## 2020-08-08 DIAGNOSIS — F0390 Unspecified dementia without behavioral disturbance: Secondary | ICD-10-CM | POA: Diagnosis present

## 2020-08-08 DIAGNOSIS — N179 Acute kidney failure, unspecified: Secondary | ICD-10-CM

## 2020-08-08 DIAGNOSIS — Z66 Do not resuscitate: Secondary | ICD-10-CM | POA: Diagnosis present

## 2020-08-08 DIAGNOSIS — R1310 Dysphagia, unspecified: Secondary | ICD-10-CM

## 2020-08-08 DIAGNOSIS — M7989 Other specified soft tissue disorders: Secondary | ICD-10-CM | POA: Diagnosis present

## 2020-08-08 DIAGNOSIS — A419 Sepsis, unspecified organism: Secondary | ICD-10-CM | POA: Diagnosis present

## 2020-08-08 DIAGNOSIS — R278 Other lack of coordination: Secondary | ICD-10-CM | POA: Diagnosis present

## 2020-08-08 DIAGNOSIS — L299 Pruritus, unspecified: Secondary | ICD-10-CM | POA: Diagnosis not present

## 2020-08-08 DIAGNOSIS — I10 Essential (primary) hypertension: Secondary | ICD-10-CM | POA: Diagnosis present

## 2020-08-08 DIAGNOSIS — Z950 Presence of cardiac pacemaker: Secondary | ICD-10-CM | POA: Diagnosis not present

## 2020-08-08 DIAGNOSIS — R6520 Severe sepsis without septic shock: Secondary | ICD-10-CM | POA: Diagnosis present

## 2020-08-08 DIAGNOSIS — M2550 Pain in unspecified joint: Secondary | ICD-10-CM | POA: Diagnosis not present

## 2020-08-08 DIAGNOSIS — J9611 Chronic respiratory failure with hypoxia: Secondary | ICD-10-CM

## 2020-08-08 DIAGNOSIS — R2689 Other abnormalities of gait and mobility: Secondary | ICD-10-CM | POA: Diagnosis present

## 2020-08-08 DIAGNOSIS — R2681 Unsteadiness on feet: Secondary | ICD-10-CM | POA: Diagnosis present

## 2020-08-08 DIAGNOSIS — F419 Anxiety disorder, unspecified: Secondary | ICD-10-CM | POA: Diagnosis present

## 2020-08-08 DIAGNOSIS — F17220 Nicotine dependence, chewing tobacco, uncomplicated: Secondary | ICD-10-CM | POA: Diagnosis present

## 2020-08-08 DIAGNOSIS — M6281 Muscle weakness (generalized): Secondary | ICD-10-CM | POA: Diagnosis present

## 2020-08-08 DIAGNOSIS — N390 Urinary tract infection, site not specified: Secondary | ICD-10-CM

## 2020-08-08 DIAGNOSIS — W06XXXA Fall from bed, initial encounter: Secondary | ICD-10-CM | POA: Diagnosis present

## 2020-08-08 DIAGNOSIS — H3530 Unspecified macular degeneration: Secondary | ICD-10-CM | POA: Diagnosis present

## 2020-08-08 DIAGNOSIS — G8929 Other chronic pain: Secondary | ICD-10-CM | POA: Diagnosis present

## 2020-08-08 DIAGNOSIS — R531 Weakness: Secondary | ICD-10-CM | POA: Diagnosis present

## 2020-08-08 DIAGNOSIS — I472 Ventricular tachycardia: Secondary | ICD-10-CM | POA: Diagnosis not present

## 2020-08-08 DIAGNOSIS — E785 Hyperlipidemia, unspecified: Secondary | ICD-10-CM | POA: Diagnosis present

## 2020-08-08 DIAGNOSIS — I442 Atrioventricular block, complete: Secondary | ICD-10-CM | POA: Diagnosis present

## 2020-08-08 DIAGNOSIS — R4781 Slurred speech: Secondary | ICD-10-CM | POA: Diagnosis present

## 2020-08-08 DIAGNOSIS — R3911 Hesitancy of micturition: Secondary | ICD-10-CM | POA: Diagnosis not present

## 2020-08-08 DIAGNOSIS — K1379 Other lesions of oral mucosa: Secondary | ICD-10-CM

## 2020-08-08 DIAGNOSIS — Z20822 Contact with and (suspected) exposure to covid-19: Secondary | ICD-10-CM | POA: Diagnosis present

## 2020-08-08 DIAGNOSIS — G9341 Metabolic encephalopathy: Secondary | ICD-10-CM | POA: Diagnosis present

## 2020-08-08 DIAGNOSIS — Z7401 Bed confinement status: Secondary | ICD-10-CM | POA: Diagnosis not present

## 2020-08-08 DIAGNOSIS — R5381 Other malaise: Secondary | ICD-10-CM | POA: Diagnosis not present

## 2020-08-08 LAB — GLUCOSE, CAPILLARY
Glucose-Capillary: 156 mg/dL — ABNORMAL HIGH (ref 70–99)
Glucose-Capillary: 182 mg/dL — ABNORMAL HIGH (ref 70–99)

## 2020-08-08 LAB — CBC
HCT: 39.7 % (ref 36.0–46.0)
Hemoglobin: 12.9 g/dL (ref 12.0–15.0)
MCH: 28.7 pg (ref 26.0–34.0)
MCHC: 32.5 g/dL (ref 30.0–36.0)
MCV: 88.2 fL (ref 80.0–100.0)
Platelets: 255 10*3/uL (ref 150–400)
RBC: 4.5 MIL/uL (ref 3.87–5.11)
RDW: 13.2 % (ref 11.5–15.5)
WBC: 8.7 10*3/uL (ref 4.0–10.5)
nRBC: 0 % (ref 0.0–0.2)

## 2020-08-08 LAB — CBG MONITORING, ED
Glucose-Capillary: 115 mg/dL — ABNORMAL HIGH (ref 70–99)
Glucose-Capillary: 145 mg/dL — ABNORMAL HIGH (ref 70–99)
Glucose-Capillary: 145 mg/dL — ABNORMAL HIGH (ref 70–99)

## 2020-08-08 LAB — HEMOGLOBIN A1C
Hgb A1c MFr Bld: 6.2 % — ABNORMAL HIGH (ref 4.8–5.6)
Mean Plasma Glucose: 131.24 mg/dL

## 2020-08-08 LAB — BASIC METABOLIC PANEL
Anion gap: 18 — ABNORMAL HIGH (ref 5–15)
BUN: 24 mg/dL — ABNORMAL HIGH (ref 8–23)
CO2: 21 mmol/L — ABNORMAL LOW (ref 22–32)
Calcium: 8.9 mg/dL (ref 8.9–10.3)
Chloride: 104 mmol/L (ref 98–111)
Creatinine, Ser: 2.39 mg/dL — ABNORMAL HIGH (ref 0.44–1.00)
GFR, Estimated: 18 mL/min — ABNORMAL LOW (ref >60)
Glucose, Bld: 122 mg/dL — ABNORMAL HIGH (ref 70–99)
Potassium: 4.3 mmol/L (ref 3.5–5.1)
Sodium: 143 mmol/L (ref 135–145)

## 2020-08-08 LAB — TROPONIN I (HIGH SENSITIVITY): Troponin I (High Sensitivity): 24 ng/L — ABNORMAL HIGH (ref <18)

## 2020-08-08 LAB — LACTIC ACID, PLASMA: Lactic Acid, Venous: 2.2 mmol/L (ref 0.5–1.9)

## 2020-08-08 LAB — RESP PANEL BY RT-PCR (FLU A&B, COVID) ARPGX2
Influenza A by PCR: NEGATIVE
Influenza B by PCR: NEGATIVE
SARS Coronavirus 2 by RT PCR: NEGATIVE

## 2020-08-08 LAB — CK: Total CK: 55 U/L (ref 38–234)

## 2020-08-08 MED ORDER — VANCOMYCIN HCL 1250 MG/250ML IV SOLN
1250.0000 mg | Freq: Once | INTRAVENOUS | Status: AC
  Administered 2020-08-08: 1250 mg via INTRAVENOUS
  Filled 2020-08-08: qty 250

## 2020-08-08 MED ORDER — INSULIN ASPART 100 UNIT/ML ~~LOC~~ SOLN
0.0000 [IU] | SUBCUTANEOUS | Status: DC
  Administered 2020-08-08: 1 [IU] via SUBCUTANEOUS
  Administered 2020-08-08: 2 [IU] via SUBCUTANEOUS
  Administered 2020-08-08: 1 [IU] via SUBCUTANEOUS
  Administered 2020-08-08: 2 [IU] via SUBCUTANEOUS
  Administered 2020-08-09: 1 [IU] via SUBCUTANEOUS
  Administered 2020-08-09: 2 [IU] via SUBCUTANEOUS
  Administered 2020-08-09: 1 [IU] via SUBCUTANEOUS

## 2020-08-08 MED ORDER — ACETAMINOPHEN 325 MG PO TABS
650.0000 mg | ORAL_TABLET | Freq: Four times a day (QID) | ORAL | Status: DC | PRN
  Administered 2020-08-10 (×2): 650 mg via ORAL
  Filled 2020-08-08 (×2): qty 2

## 2020-08-08 MED ORDER — HEPARIN SODIUM (PORCINE) 5000 UNIT/ML IJ SOLN
5000.0000 [IU] | Freq: Three times a day (TID) | INTRAMUSCULAR | Status: DC
  Administered 2020-08-08 – 2020-08-12 (×13): 5000 [IU] via SUBCUTANEOUS
  Filled 2020-08-08 (×13): qty 1

## 2020-08-08 MED ORDER — LACTATED RINGERS IV SOLN: INTRAVENOUS | Status: DC

## 2020-08-08 MED ORDER — SODIUM CHLORIDE 0.9 % IV SOLN
1.0000 g | INTRAVENOUS | Status: AC
  Administered 2020-08-08 – 2020-08-11 (×4): 1 g via INTRAVENOUS
  Filled 2020-08-08 (×2): qty 10
  Filled 2020-08-08 (×3): qty 1

## 2020-08-08 MED ORDER — VANCOMYCIN VARIABLE DOSE PER UNSTABLE RENAL FUNCTION (PHARMACIST DOSING): Status: DC

## 2020-08-08 MED ORDER — SENNOSIDES-DOCUSATE SODIUM 8.6-50 MG PO TABS: 1.0000 | ORAL_TABLET | Freq: Every evening | ORAL | Status: DC | PRN

## 2020-08-08 MED ORDER — ATORVASTATIN CALCIUM 40 MG PO TABS
40.0000 mg | ORAL_TABLET | Freq: Every day | ORAL | Status: DC
  Administered 2020-08-08 – 2020-08-11 (×4): 40 mg via ORAL
  Filled 2020-08-08 (×4): qty 1

## 2020-08-08 MED ORDER — ACETAMINOPHEN 650 MG RE SUPP: 650.0000 mg | Freq: Four times a day (QID) | RECTAL | Status: DC | PRN

## 2020-08-08 NOTE — Plan of Care (Signed)
  Problem: Education: Goal: Knowledge of General Education information will improve Description: Including pain rating scale, medication(s)/side effects and non-pharmacologic comfort measures Outcome: Progressing   Problem: Clinical Measurements: Goal: Will remain free from infection Outcome: Progressing   Problem: Nutrition: Goal: Adequate nutrition will be maintained Outcome: Progressing   Problem: Elimination: Goal: Will not experience complications related to urinary retention Outcome: Progressing   Problem: Pain Managment: Goal: General experience of comfort will improve Outcome: Progressing   Problem: Safety: Goal: Ability to remain free from injury will improve Outcome: Progressing   Problem: Skin Integrity: Goal: Risk for impaired skin integrity will decrease Outcome: Progressing

## 2020-08-08 NOTE — ED Notes (Signed)
Pt transported to swallow study as report called to floor.

## 2020-08-08 NOTE — Evaluation (Signed)
Clinical/Bedside Swallow Evaluation Patient Details  Name: Maria [name] MRN: [id number] Date of Birth: 05-29-1924  Today's Date: 08/08/2020 Time: SLP Start Time (ACUTE ONLY): 91 SLP Stop Time (ACUTE ONLY): 8841 SLP Time Calculation (min) (ACUTE ONLY): 68 min  Past Medical History:  Past Medical History:  Diagnosis Date   Anxiety    Arthritis    Cancer ([place])    breast 2015   Cardiac pacemaker in situ    Chest pain    Diabetes mellitus without complication ([place])    DM type 2 (diabetes mellitus, type 2) ([place])    Dyslipidemia    Dyspnea    with exertion   Family history of adverse reaction to anesthesia    daughter slow to wake up   Gallstones    Headache    Hypertension    Macular degeneration    Pulmonary hypertension ([place])    PA peak pressure 81 mmHg by 09/2016 echo   Type II or unspecified type diabetes mellitus without mention of complication, not stated as uncontrolled    Wears partial dentures    Past Surgical History:  Past Surgical History:  Procedure Laterality Date   BREAST EXCISIONAL BIOPSY Right 2010   CATARACT EXTRACTION     CHOLECYSTECTOMY N/A 01/18/2017   Procedure: LAPAROSCOPIC CHOLECYSTECTOMY;  Surgeon: Ralene Ok, MD;  Location: [facility];  Service: General;  Laterality: N/A;   IR GENERIC HISTORICAL  10/08/2016   IR PERC CHOLECYSTOSTOMY 10/08/2016 Greggory Keen, MD [facility]-INTERV RAD   IR RADIOLOGIST EVAL & MGMT  11/16/2016   KNEE SURGERY Bilateral    MULTIPLE TOOTH EXTRACTIONS     PACEMAKER INSERTION     [employer], [facility] 2003   PACEMAKER INSERTION     PPM GENERATOR CHANGEOUT N/A 04/16/2019   Procedure: PPM GENERATOR CHANGEOUT;  Surgeon: Evans Lance, MD;  Location: [facility];  Service: Cardiovascular;  Laterality: N/A;   TOTAL KNEE ARTHROPLASTY Bilateral    HPI:  Maria [name] is a 86 year old woman with history of diabetes mellitus, hypertension, hyperlipidemia, severe pulmonary HTN, chronic hypoxic  respiratory failure, complete heart block with PPM, breast cancer (2015) who presented to the ED via EMS with generalized weakness and confusion.  Suspect confusion secondary to infection/sepsis especially given patient's mild dementia and imaging suggestive of prior CVA. Has been made NPO dur to prior admission on mech soft diet. SLP eval on 10/18/19 recommended soft diet due to confusion, swallow otherwise appears WNL. Also found to have Quarter-sized soft-appearing mass in the middle of the palate noted on MD exam. Lives in a single story home. Grandson stays with her at night, and son visits daily. Daughter manages her bills, groceries, cooking, bathing, doctor's appointments. Patient uses a walker and is able to dress and feed herself but does not bathe herself.    Assessment / Plan / Recommendation Clinical Impression  Pt demonstrates immediate coughing with thin liquids but tolerated puree well. Will proceed with MBS. Pt additionally reports that she sometimes regurgitates her food.  SLP Visit Diagnosis: Dysphagia, unspecified (R13.10)    Aspiration Risk  Moderate aspiration risk    Diet Recommendation NPO except meds   Medication Administration: Crushed with puree Postural Changes: Seated upright at 90 degrees    Other  Recommendations Oral Care Recommendations: Oral care BID   Follow up Recommendations        Frequency and Duration            Prognosis  Swallow Study   General HPI: Maria [name] is a 86 year old woman with history of diabetes mellitus, hypertension, hyperlipidemia, severe pulmonary HTN, chronic hypoxic respiratory failure, complete heart block with PPM, breast cancer (2015) who presented to the ED via EMS with generalized weakness and confusion.  Suspect confusion secondary to infection/sepsis especially given patient's mild dementia and imaging suggestive of prior CVA. Has been made NPO dur to prior admission on mech soft diet. SLP eval on 10/18/19  recommended soft diet due to confusion, swallow otherwise appears WNL. Also found to have Quarter-sized soft-appearing mass in the middle of the palate noted on MD exam. Lives in a single story home. Grandson stays with her at night, and son visits daily. Daughter manages her bills, groceries, cooking, bathing, doctor's appointments. Patient uses a walker and is able to dress and feed herself but does not bathe herself.  Type of Study: Bedside Swallow Evaluation Previous Swallow Assessment: see HPI Diet Prior to this Study: NPO Temperature Spikes Noted: No History of Recent Intubation: No Behavior/Cognition: Alert;Cooperative;Pleasant mood Oral Cavity Assessment: Within Functional Limits Oral Care Completed by SLP: Recent completion by staff Self-Feeding Abilities: Able to feed self Patient Positioning: Upright in bed Baseline Vocal Quality: Normal Volitional Cough: Strong Volitional Swallow: Unable to elicit    Oral/Motor/Sensory Function Overall Oral Motor/Sensory Function: Within functional limits   Ice Chips     Thin Liquid Thin Liquid: Impaired Presentation: Cup;Straw Pharyngeal  Phase Impairments: Cough - Delayed;Throat Clearing - Immediate    Nectar Thick Nectar Thick Liquid: Not tested   Honey Thick Honey Thick Liquid: Not tested   Puree Puree: Within functional limits   Solid           Herbie Baltimore, MA [place] Pager [phone] Office [phone]  Kennith Morss, Katherene Ponto 08/08/2020,11:38 AM

## 2020-08-08 NOTE — ED Notes (Addendum)
Able to speak with Dr. Shon Baton - IM resident regarding pt diet orders and vancomycin reaction - itching. Added vancomycin in allergy list.

## 2020-08-08 NOTE — Progress Notes (Signed)
Subjective:   Hospital day: 1  She is seen at bedside.  Feels about the same from yesterday.  Complains of generalized weakness.  Denies current dysuria but has had dysuria before.  When asked about the soft palate mass, she states that she has had it for a long time.  Pt eating breakfast this morning. Noted to have significant coughing and throat clearing with drinking milk.  Unsuccessful attempt to call patient's daughter.  Will call again tomorrow.  Objective:  Vital signs in last 24 hours: Vitals:   08/08/20 0800 08/08/20 0830 08/08/20 1015 08/08/20 1017  BP: (!) 150/85 (!) 162/93 104/82   Pulse: 98 (!) 104 (!) 101   Resp: (!) 22 (!) 26 18   Temp:    (!) 97.5 F (36.4 C)  TempSrc:      SpO2: 97% 100% 98%   Weight:      Height:       CBC Latest Ref Rng & Units 08/08/2020 08/07/2020 04/01/2020  WBC 4.0 - 10.5 K/uL 8.7 11.0(H) 7.3  Hemoglobin 12.0 - 15.0 g/dL 12.9 12.8 13.4  Hematocrit 36 - 46 % 39.7 40.7 42.8  Platelets 150 - 400 K/uL 255 268 251   CMP Latest Ref Rng & Units 08/08/2020 08/07/2020 04/01/2020  Glucose 70 - 99 mg/dL 122(H) 115(H) 168(H)  BUN 8 - 23 mg/dL 24(H) 28(H) 15  Creatinine 0.44 - 1.00 mg/dL 2.39(H) 2.67(H) 0.67  Sodium 135 - 145 mmol/L 143 139 136  Potassium 3.5 - 5.1 mmol/L 4.3 4.8 4.4  Chloride 98 - 111 mmol/L 104 100 99  CO2 22 - 32 mmol/L 21(L) 20(L) 25  Calcium 8.9 - 10.3 mg/dL 8.9 9.6 9.8  Total Protein 6.5 - 8.1 g/dL - 7.7 8.2(H)  Total Bilirubin 0.3 - 1.2 mg/dL - 0.9 0.9  Alkaline Phos 38 - 126 U/L - 42 47  AST 15 - 41 U/L - 24 24  ALT 0 - 44 U/L - 20 26     Physical Exam  Physical Exam Constitutional:      General: She is not in acute distress.    Comments: Alert, awake, oriented to person, place and situation but not to time  HENT:     Head: Normocephalic.     Mouth/Throat:     Comments: See picture for soft palate mass Eyes:     General:        Right eye: No discharge.        Left eye: No discharge.  Cardiovascular:      Rate and Rhythm: Normal rate and regular rhythm.  Pulmonary:     Effort: No respiratory distress.     Comments: Diminished breath sound right lung base Abdominal:     General: Bowel sounds are normal.     Palpations: Abdomen is soft.     Tenderness: There is no abdominal tenderness. There is no right CVA tenderness or left CVA tenderness.     Comments: No suprapubic tenderness  Musculoskeletal:     Cervical back: Normal range of motion.     Right lower leg: No edema.     Left lower leg: No edema.  Neurological:     Mental Status: She is alert.  Psychiatric:        Mood and Affect: Mood normal.       Assessment/Plan: [name] is a 86 year old woman with history of diabetes mellitus, hypertension, hyperlipidemia, severe pulmonary HTN, chronic hypoxic respiratory failure, complete heart block with PPM, breast cancer (  2015) who presented to the ED with generalized weakness and admitted for sepsis likely secondary to UTI.  Principal Problem:   Severe sepsis ([place]) Active Problems:   UTI (urinary tract infection)   DM type 2 (diabetes mellitus, type 2) (HCC)   Acute encephalopathy   Generalized weakness   Acute kidney injury ([place])  Severe sepsis Acute encephalopathy Urinary tract infection Generalized weakness Patient is alert, awake, oriented to person, place and situation but not to time.  Still complaining of generalized weakness.  She denies current dysuria but has had it in the past.  Her UA only shows moderate hemoglobin, moderate leukocytes esterase with rare bacteria.  However with history of increased urinary frequency and suprapubic tenderness, will treat her UTI.  No other source of infection.  No sacral wound noticed on physical exam.  Lactic acid trending down 3.6-2.2. - Continue IVF with LR 100 cc/h - continue ceftriaxone, holding vancomycin due to pruritus. - f/u urine and blood cultures - Delirium precautions - Fall precautions - PT/OT  eval   Dysphagia Patient is observed to have 2 episode of choking while drinking milk during examination this morning.  Per chart review, patient was on mechanical soft diet during last hospitalization.  Speech therapy was consulted and recommended a modified barium swallow study. -N.p.o. at this time   Acute kidney injury Baseline Cr ~0.6-0.9. Suspect pre-renal in setting of dehydration and sepsis.  Renal ultrasound showed normal anatomy with no hydronephrosis.  Creatinine improved slightly from 2.67-2.39 after fluid resuscitation. -Continue maintenance fluids with LR 100 cc/h when patient still n.p.o.   Diabetes mellitus On metformin 500 mg twice daily at home. A1c 6.5% 9 months ago. - Hold home metformin in s/o AKI - SSI -Monitor CBG   Hypertension - Hold home antihypertensives in setting of sepsis and AKI -Can consider restarting amlodipine if blood pressure elevated   Hyperlipidemia - Continue home atorvastatin 40 mg daily   Chronic hypoxic respiratory failure Severe pulmonary hypertension Per most recent PCP note in Care Everywhere from 06/24/20, patient on home oxygen with exertion.  -Currently satting well on room air   Mass on palate Quarter-sized soft-appearing mass in the middle of the palate noted on exam. Given patient's history of snuff use, consider outpatient ENT evaluation.   Diet:  N.p.o. VTE: Heparin IVF: LR @ 100 cc/hr Code: Partial: CPR, DNI  Prior to Admission Living Arrangement: Home Anticipated Discharge Location: To be determined Barriers to Discharge: Medical management  Gaylan Gerold, DO 08/08/2020, 12:43 PM Pager: [phone] After 5pm on weekdays and 1pm on weekends: On Call pager [phone]

## 2020-08-08 NOTE — ED Notes (Signed)
Pt was returned to ED after swallow study and not to receiving floor. Transport states they can't take the patient from a test to the floor, they can only do that after 3:00 pm.

## 2020-08-08 NOTE — ED Notes (Signed)
Dr Alfonse Spruce at bedside, mass noted to anterior roof  of mouth, hard pallet . Pt is now NPOI

## 2020-08-08 NOTE — ED Notes (Signed)
Pt moved to purple zone. No cardiac monitoring capability in room as telebox placed on patient.

## 2020-08-08 NOTE — ED Notes (Addendum)
Vanc is stopped d/t itching above IV site about 20 min after antibiotic was started. Unsure if it is related.  I have reached out to Admitting to advise.

## 2020-08-08 NOTE — Progress Notes (Signed)
Pharmacy Antibiotic Note  Maria [name] is a 86 y.o. female admitted on 08/07/2020 with sepsis.  Pharmacy has been consulted for vancomycin dosing.  Pt w/ AKI; baseline SCr <1, currently 2.67.  Plan: Vancomycin 1250mg  IV x1 and monitor SCr/levels prior to redosing.  Goal trough 15-20 mcg/mL.   Temp (24hrs), Avg:98.5 F (36.9 C), Min:98.4 F (36.9 C), Max:98.6 F (37 C)  Recent Labs  Lab 08/07/20 2052 08/07/20 2323  WBC 11.0*  --   CREATININE 2.67*  --   LATICACIDVEN 3.6* 2.2*    Estimated Creatinine Clearance: 11.1 mL/min (A) (by C-G formula based on SCr of 2.67 mg/dL (H)).    No Known Allergies   Thank you for allowing pharmacy to be a part of this patient's care.  Wynona Neat, PharmD, BCPS  08/08/2020 2:15 AM

## 2020-08-08 NOTE — ED Notes (Signed)
Attempted to call report, nurse will call back.

## 2020-08-08 NOTE — ED Notes (Signed)
Pt is too weak to get up for orthostatics and has been able to void via purewick so In and Out cath was not necessary.

## 2020-08-08 NOTE — ED Notes (Signed)
Pt expressed some sadness and concerns over her care at home related to a daughter that might be POA. She knows she needs help but would like more options besides her family She would like to talk with someone about options for her care.  I suggested that perhaps we could ask SW/Care Mgmt to speak with her and get a better handle on her concerns and what perhaps we could do to help her.

## 2020-08-08 NOTE — Progress Notes (Signed)
   Pt is active with [facility]--Will follow hospital course and plan to resume home nursing and social work visits upon d/c back home.  Please call [facility] if we can assist with d/c planning.  Wynetta Fines, RN [phone]

## 2020-08-08 NOTE — Progress Notes (Signed)
Modified Barium Swallow Progress Note  Patient Details  Name: Maria [name] MRN: [id number] Date of Birth: Apr 25, 1924  Today's Date: 08/08/2020  Modified Barium Swallow completed.  Full report located under Chart Review in the Imaging Section.  Brief recommendations include the following:  Clinical Impression  Pt demonstrates adequate oral and oropharyngeal function. She was able to feed herself cup and straw sips. With thin liquids she had a brief oral hold and adequate pharyngeal function. One instance of sensed trace aspiration occurred while pt trying ot take a pill with water. Recommend pills whole in puree. Though pts mastication ability was adequate she reprots instances of food lodging and blocking he esophagus. GIven this bothersome symptom, will attempt a dys 2 (ground) texture diet intially, but pt could upgrade if she does not like it. Will f/u briefly.    Swallow Evaluation Recommendations               Medication Administration: Whole meds with puree   Supervision: Patient able to self feed       Postural Changes: Seated upright at 90 degrees            Maria [name], Katherene Ponto 08/08/2020,1:58 PM

## 2020-08-08 NOTE — Progress Notes (Signed)
PT Cancellation Note  Patient Details Name: Maria [name] MRN: [id number] DOB: 1924/07/16   Cancelled Treatment:    Reason Eval/Treat Not Completed: Patient declined, no reason specified. Pt eating dinner upon PT arrival, refusing evaluation at this time, reporting she is hungry and wants to eat and is tired from being rushed all over the place today. PT will attempt to follow up as time allows.   Zenaida Niece 08/08/2020, 5:16 PM

## 2020-08-08 NOTE — ED Notes (Signed)
Pt transferred to hospital bed, gown changed and pt washed.

## 2020-08-09 LAB — CBC
HCT: 39.2 % (ref 36.0–46.0)
Hemoglobin: 12.8 g/dL (ref 12.0–15.0)
MCH: 27.8 pg (ref 26.0–34.0)
MCHC: 32.7 g/dL (ref 30.0–36.0)
MCV: 85.2 fL (ref 80.0–100.0)
Platelets: 242 10*3/uL (ref 150–400)
RBC: 4.6 MIL/uL (ref 3.87–5.11)
RDW: 13.2 % (ref 11.5–15.5)
WBC: 6.4 10*3/uL (ref 4.0–10.5)
nRBC: 0 % (ref 0.0–0.2)

## 2020-08-09 LAB — URINE CULTURE

## 2020-08-09 LAB — BASIC METABOLIC PANEL
Anion gap: 17 — ABNORMAL HIGH (ref 5–15)
BUN: 19 mg/dL (ref 8–23)
CO2: 23 mmol/L (ref 22–32)
Calcium: 8.5 mg/dL — ABNORMAL LOW (ref 8.9–10.3)
Chloride: 101 mmol/L (ref 98–111)
Creatinine, Ser: 1.25 mg/dL — ABNORMAL HIGH (ref 0.44–1.00)
GFR, Estimated: 40 mL/min — ABNORMAL LOW (ref >60)
Glucose, Bld: 132 mg/dL — ABNORMAL HIGH (ref 70–99)
Potassium: 3.1 mmol/L — ABNORMAL LOW (ref 3.5–5.1)
Sodium: 141 mmol/L (ref 135–145)

## 2020-08-09 LAB — GLUCOSE, CAPILLARY
Glucose-Capillary: 124 mg/dL — ABNORMAL HIGH (ref 70–99)
Glucose-Capillary: 127 mg/dL — ABNORMAL HIGH (ref 70–99)
Glucose-Capillary: 142 mg/dL — ABNORMAL HIGH (ref 70–99)
Glucose-Capillary: 142 mg/dL — ABNORMAL HIGH (ref 70–99)
Glucose-Capillary: 169 mg/dL — ABNORMAL HIGH (ref 70–99)
Glucose-Capillary: 171 mg/dL — ABNORMAL HIGH (ref 70–99)
Glucose-Capillary: 188 mg/dL — ABNORMAL HIGH (ref 70–99)

## 2020-08-09 MED ORDER — INSULIN ASPART 100 UNIT/ML ~~LOC~~ SOLN
0.0000 [IU] | Freq: Three times a day (TID) | SUBCUTANEOUS | Status: DC
  Administered 2020-08-09: 2 [IU] via SUBCUTANEOUS
  Administered 2020-08-09 (×2): 1 [IU] via SUBCUTANEOUS
  Administered 2020-08-10 (×2): 2 [IU] via SUBCUTANEOUS
  Administered 2020-08-10: 1 [IU] via SUBCUTANEOUS
  Administered 2020-08-10: 3 [IU] via SUBCUTANEOUS
  Administered 2020-08-11 – 2020-08-12 (×5): 2 [IU] via SUBCUTANEOUS

## 2020-08-09 NOTE — Progress Notes (Signed)
Subjective:   Hospital day: 1  Overnight event: No acute event  Patient reports pain with urination. Otherwise has no complains  Unsuccessful attempt to call daughter x 2.  Also his son's phone number is incorrect.  Objective:  Vital signs in last 24 hours: Vitals:   08/08/20 1257 08/08/20 1501 08/08/20 2000 08/09/20 0438  BP: (!) 150/74 (!) 133/96 140/89 136/86  Pulse: 89 90 87 78  Resp:  18 17 17   Temp: 97.7 F (36.5 C) (!) 97.5 F (36.4 C) 97.9 F (36.6 C) 98.4 F (36.9 C)  TempSrc: Oral Oral Oral Oral  SpO2: 97% 96% 97% 98%  Weight:      Height:       CBC Latest Ref Rng & Units 08/09/2020 08/08/2020 08/07/2020  WBC 4.0 - 10.5 K/uL 6.4 8.7 11.0(H)  Hemoglobin 12.0 - 15.0 g/dL 12.8 12.9 12.8  Hematocrit 36 - 46 % 39.2 39.7 40.7  Platelets 150 - 400 K/uL 242 255 268   CMP Latest Ref Rng & Units 08/09/2020 08/08/2020 08/07/2020  Glucose 70 - 99 mg/dL 132(H) 122(H) 115(H)  BUN 8 - 23 mg/dL 19 24(H) 28(H)  Creatinine 0.44 - 1.00 mg/dL 1.25(H) 2.39(H) 2.67(H)  Sodium 135 - 145 mmol/L 141 143 139  Potassium 3.5 - 5.1 mmol/L 3.1(L) 4.3 4.8  Chloride 98 - 111 mmol/L 101 104 100  CO2 22 - 32 mmol/L 23 21(L) 20(L)  Calcium 8.9 - 10.3 mg/dL 8.5(L) 8.9 9.6  Total Protein 6.5 - 8.1 g/dL - - 7.7  Total Bilirubin 0.3 - 1.2 mg/dL - - 0.9  Alkaline Phos 38 - 126 U/L - - 42  AST 15 - 41 U/L - - 24  ALT 0 - 44 U/L - - 20     Physical Exam  Physical Exam Constitutional:      General: She is not in acute distress.    Comments: Somnolent but awake to verbal stimulation  HENT:     Head: Normocephalic.  Eyes:     General:        Right eye: No discharge.        Left eye: No discharge.  Cardiovascular:     Rate and Rhythm: Normal rate and regular rhythm.  Pulmonary:     Effort: No respiratory distress.     Breath sounds: Normal breath sounds.  Abdominal:     General: Bowel sounds are normal. There is no distension.     Palpations: Abdomen is soft.     Comments:  Positive for suprapubic tenderness  Musculoskeletal:     Right lower leg: No edema.     Left lower leg: No edema.  Psychiatric:        Mood and Affect: Mood normal.     Assessment/Plan: [name] is a 86 year old woman with history of diabetes mellitus, hypertension, hyperlipidemia, severe pulmonary HTN, chronic hypoxic respiratory failure, complete heart block with PPM, breast cancer (2015) who presented to the ED with generalized weakness and admitted for sepsis likely secondary to UTI.  Principal Problem:   Severe sepsis ([place]) Active Problems:   Acute UTI (urinary tract infection)   DM type 2 (diabetes mellitus, type 2) (HCC)   Acute encephalopathy   Generalized weakness   Acute kidney injury ([place])  Severe urosepsis Acute encephalopathy Urinary tract infection Generalized weakness Patient appears tired on exam with ongoing dysuria and suprapubic tenderness. Will continue IV ceftriaxone. She is afebrile in the last 24h with no elevated WBC.  - continue ceftriaxone (  day 2) - Urine culture show multiple species - Blood culture no growth todate - Delirium precautions - Fall precautions - PT/OT eval   Dysphagia Swallow study shows mild aspiration with thin liquid. -Dysphagia 2 diet   Acute kidney injury Baseline Cr ~0.6-0.9.Suspect pre-renal in setting of dehydration and sepsis.  Renal ultrasound showed normal anatomy with no hydronephrosis.  Creatinine improved from 2.39 to 1.25 after gentle hydration - Encourage PO intake - BMP in am   Diabetes mellitus On metformin 500 mg twice daily at home. A1c 6.5% 9 months ago. CBG acceptable - Hold home metformin in s/o AKI - SSI - Monitor CBG   Hypertension - Hold home antihypertensives in setting of sepsis and AKI - Can consider restarting amlodipine if blood pressure elevated   Hyperlipidemia - Continue home atorvastatin 40 mg daily   Chronic hypoxic respiratory failure Severe pulmonary  hypertension Per most recent PCP note in Care Everywhere from 06/24/20, patient on home oxygen with exertion.  -Currently satting well on room air   Mass on palate Quarter-sized soft-appearing mass in the middle of the palatenoted on exam. Given patient's history of snuff use, consider outpatient ENT evaluation.   Diet: N.p.o. SFS:ELTRVUY IVF:NA Code:Partial: CPR, DNI  Prior to Admission Living Arrangement: Home Anticipated Discharge Location: To be determined Barriers to Discharge: Medical management  Gaylan Gerold, DO 08/09/2020, 7:34 AM Pager: [phone] After 5pm on weekdays and 1pm on weekends: On Call pager [phone]

## 2020-08-09 NOTE — Evaluation (Signed)
Physical Therapy Evaluation Patient Details Name: Maria [name] MRN: [id number] DOB: 09/21/1923 Today's Date: 08/09/2020   History of Present Illness  [name] is a 86 year old woman with history of diabetes mellitus, hypertension, hyperlipidemia, severe pulmonary HTN, chronic hypoxic respiratory failure, complete heart block with PPM, breast cancer (2015) who presented to the ED with generalized weakness and admitted for sepsis likely secondary to UTI.  Clinical Impression  Patient presents with dependencies in gait and mobility.  Pt also appears somewhat limited by confusion.  Pt has support at home.  If family can continue to provide close to 24 hour supervision, feel patient can return home, but if not, may need to consider SNF placement, at least short-term until she can better manage herself.  Pt will need f/u PT at home or SNF to progress mobility and independence.      Follow Up Recommendations SNF (depending if family can provide appropriate support.)    Equipment Recommendations  None recommended by PT    Recommendations for Other Services       Precautions / Restrictions Precautions Precautions: Fall      Mobility  Bed Mobility Overal bed mobility: Needs Assistance Bed Mobility: Supine to Sit     Supine to sit: Min guard     General bed mobility comments: able to get to EOB with bed rails and increased time    Transfers Overall transfer level: Needs assistance Equipment used: Rolling walker (2 wheeled) Transfers: Sit to/from Stand Sit to Stand: Min assist         General transfer comment: also transferred on/off BSC with min assist; assist to power up  Ambulation/Gait Ambulation/Gait assistance: Min assist Gait Distance (Feet): 20 Feet Assistive device: Rolling walker (2 wheeled) Gait Pattern/deviations: Step-through pattern;Decreased stride length Gait velocity: decreased   General Gait Details: slow pace  Stairs            Wheelchair  Mobility    Modified Rankin (Stroke Patients Only)       Balance Overall balance assessment: Needs assistance Sitting-balance support: No upper extremity supported;Feet supported Sitting balance-Leahy Scale: Fair     Standing balance support: Bilateral upper extremity supported;During functional activity Standing balance-Leahy Scale: Poor Standing balance comment: reliant on RW for support                             Pertinent Vitals/Pain Pain Assessment: No/denies pain    Home Living Family/patient expects to be discharged to:: Private residence Living Arrangements: Children (Per chart, daughter lives with her and grandson stays at night) Available Help at Discharge: Family;Available PRN/intermittently Type of Home: House Home Access: Level entry     Home Layout: One level Home Equipment: Walker - 2 wheels Additional Comments: pt giving different answers to same questions; keep saying "before I moved here...."    Prior Function Level of Independence: Independent with assistive device(s)   Gait / Transfers Assistance Needed: typically uses RW; recently grandson/son have been having to help more with mobility  ADL's / Homemaking Assistance Needed: daughter does most homemaking        Hand Dominance        Extremity/Trunk Assessment   Upper Extremity Assessment Upper Extremity Assessment: Generalized weakness    Lower Extremity Assessment Lower Extremity Assessment: Generalized weakness       Communication   Communication: No difficulties  Cognition Arousal/Alertness: Lethargic Behavior During Therapy: Flat affect Overall Cognitive Status: No family/caregiver present to  determine baseline cognitive functioning                                        General Comments General comments (skin integrity, edema, etc.): pt kept asking how long she had to sit in the chair and reports she prefer to lay in bed all day long.     Exercises     Assessment/Plan    PT Assessment Patient needs continued PT services  PT Problem List Decreased strength;Decreased activity tolerance;Decreased balance;Decreased mobility;Decreased cognition;Decreased knowledge of use of DME       PT Treatment Interventions DME instruction;Gait training;Functional mobility training;Balance training;Therapeutic exercise;Therapeutic activities;Patient/family education    PT Goals (Current goals can be found in the Care Plan section)  Acute Rehab PT Goals Patient Stated Goal: get back to bed PT Goal Formulation: With patient Time For Goal Achievement: 08/16/20 Potential to Achieve Goals: Good    Frequency Min 3X/week   Barriers to discharge        Co-evaluation               AM-PAC PT "6 Clicks" Mobility  Outcome Measure Help needed turning from your back to your side while in a flat bed without using bedrails?: A Little Help needed moving from lying on your back to sitting on the side of a flat bed without using bedrails?: A Little Help needed moving to and from a bed to a chair (including a wheelchair)?: A Little Help needed standing up from a chair using your arms (e.g., wheelchair or bedside chair)?: A Little Help needed to walk in hospital room?: A Little Help needed climbing 3-5 steps with a railing? : A Little 6 Click Score: 18    End of Session Equipment Utilized During Treatment: Gait belt Activity Tolerance: Patient tolerated treatment well Patient left: in chair;with chair alarm set;with call bell/phone within reach   PT Visit Diagnosis: Other abnormalities of gait and mobility (R26.89);Unsteadiness on feet (R26.81);Muscle weakness (generalized) (M62.81)    Time: 1500-1530 PT Time Calculation (min) (ACUTE ONLY): 30 min   Charges:   PT Evaluation $PT Eval Moderate Complexity: 1 Mod PT Treatments $Gait Training: 8-22 mins        08/09/2020 Margie, PT Acute Rehabilitation Services Pager:   [phone] Office:  [place] 08/09/2020, 3:32 PM

## 2020-08-09 NOTE — Evaluation (Signed)
Occupational Therapy Evaluation Patient Details Name: Maria [name] MRN: [id number] DOB: 08-01-1924 Today's Date: 08/09/2020    History of Present Illness [name] is a 86 year old woman with history of diabetes mellitus, hypertension, hyperlipidemia, severe pulmonary HTN, chronic hypoxic respiratory failure, complete heart block with PPM, breast cancer (2015) who presented to the ED with generalized weakness and admitted for sepsis likely secondary to UTI.   Clinical Impression   Patient admitted with the above diagnosis.  Currently presenting with the deficits listed below.  At home, it sounds as if she was Mod I with mobility at RW level; able to dress and toilet herself until a few days prior to admission.  At that time she required Max A for mobility and was unable to participate with ADL's.  Currently, she is transferring with Min A, performing lower body ADL with up to Mod A and requires Min Guard to supervision with toileting. She would like to return home, but believes she will need additional supports during the daytime.  The daughter and son do provide a lot of assist for IADL's and community mobility.  OT will follow in the acute setting, if family can provide 24 hour assist at home, that would be her preference.  SNF could be recommended depending on progress and family support.  HH at an ALF is also an option.       Follow Up Recommendations  Home health OT;Supervision/Assistance - 24 hour    Equipment Recommendations  3 in 1 bedside commode    Recommendations for Other Services       Precautions / Restrictions Precautions Precautions: Fall Restrictions Weight Bearing Restrictions: No      Mobility Bed Mobility Overal bed mobility: Needs Assistance Bed Mobility: Sit to Supine     Supine to sit: Min guard Sit to supine: Supervision   General bed mobility comments: able to get to EOB with bed rails and increased time    Transfers Overall transfer level:  Needs assistance Equipment used: Rolling walker (2 wheeled) Transfers: Sit to/from [facility] Sit to Stand: Min assist Stand pivot transfers: Min guard       General transfer comment: also transferred on/off BSC with min assist; assist to power up    Balance Overall balance assessment: Needs assistance Sitting-balance support: No upper extremity supported;Feet supported Sitting balance-Leahy Scale: Fair     Standing balance support: Bilateral upper extremity supported;During functional activity Standing balance-Leahy Scale: Poor Standing balance comment: reliant on RW for support                           ADL either performed or assessed with clinical judgement   ADL Overall ADL's : Needs assistance/impaired     Grooming: Wash/dry hands;Wash/dry face;Set up;Sitting   Upper Body Bathing: Set up;Sitting   Lower Body Bathing: Moderate assistance;Sit to/from stand   Upper Body Dressing : Minimal assistance;Sitting   Lower Body Dressing: Moderate assistance;Sit to/from stand   Toilet Transfer: Minimal assistance;RW     Toileting - Clothing Manipulation Details (indicate cue type and reason): unable to urinate.     Functional mobility during ADLs: Minimal assistance       Vision Baseline Vision/History: Wears glasses Wears Glasses: At all times Patient Visual Report: No change from baseline                  Pertinent Vitals/Pain Pain Assessment: No/denies pain     Hand Dominance Right  Extremity/Trunk Assessment Upper Extremity Assessment Upper Extremity Assessment: Overall WFL for tasks assessed   Lower Extremity Assessment Lower Extremity Assessment: Defer to PT evaluation   Cervical / Trunk Assessment Cervical / Trunk Assessment: Normal   Communication Communication Communication: No difficulties   Cognition Arousal/Alertness: Awake/alert Behavior During Therapy: Flat affect Overall Cognitive Status: History of  cognitive impairments - at baseline                                 General Comments: Patient oriented to self.  Increased procession time noted.  Able to follow one step commands.   General Comments  pt kept asking how long she had to sit in the chair and reports she prefer to lay in bed all day long.               Home Living Family/patient expects to be discharged to:: Private residence Living Arrangements: Children Available Help at Discharge: Family;Available PRN/intermittently Type of Home: House Home Access: Level entry     Home Layout: One level     Bathroom Shower/Tub: Teacher, early years/pre: Standard     Home Equipment: Environmental consultant - 2 wheels   Additional Comments: per the chart: Daughter reports the patient has been sundowning, acting confused, and having difficulty sleeping for the last couple of evenings prior to admission.      Prior Functioning/Environment Level of Independence: Needs assistance  Gait / Transfers Assistance Needed: Mod I with RW, family notes increasing weakness and needing more help with mobility and transfers.  Per the chart: patient's son has been carrying her to the bathroom just before this admit. ADL's / Homemaking Assistance Needed: The daughter cooks, shops, and manages the finances for the patient. Her son, the patient's grandson, sleeps at the patient's house at night, and the patient's son visits every morning.            OT Problem List: Decreased strength;Decreased activity tolerance;Impaired balance (sitting and/or standing)      OT Treatment/Interventions: Self-care/ADL training;Therapeutic exercise;Balance training;Therapeutic activities    OT Goals(Current goals can be found in the care plan section) Acute Rehab OT Goals Patient Stated Goal: Patient wants to sleep. OT Goal Formulation: With patient Time For Goal Achievement: 08/22/20 Potential to Achieve Goals: Fair ADL Goals Pt Will Perform  Grooming: with set-up;standing Pt Will Perform Lower Body Bathing: sit to/from stand;with min guard assist Pt Will Perform Lower Body Dressing: with set-up;sit to/from stand Pt Will Transfer to Toilet: with modified independence;ambulating;regular height toilet  OT Frequency: Min 2X/week   Barriers to D/C:    none noted       Co-evaluation              AM-PAC OT "6 Clicks" Daily Activity     Outcome Measure Help from another person eating meals?: None Help from another person taking care of personal grooming?: A Little Help from another person toileting, which includes using toliet, bedpan, or urinal?: A Little Help from another person bathing (including washing, rinsing, drying)?: A Lot Help from another person to put on and taking off regular upper body clothing?: A Little Help from another person to put on and taking off regular lower body clothing?: A Lot 6 Click Score: 17   End of Session Equipment Utilized During Treatment: Rolling walker Nurse Communication: Mobility status  Activity Tolerance: Patient tolerated treatment well Patient left: in bed;with call bell/phone within reach;with  bed alarm set  OT Visit Diagnosis: Unsteadiness on feet (R26.81);Muscle weakness (generalized) (M62.81);History of falling (Z91.81);Other symptoms and signs involving cognitive function                Time: 1549-1610 OT Time Calculation (min): 21 min Charges:  OT General Charges $OT Visit: 1 Visit OT Evaluation $OT Eval Moderate Complexity: 1 Mod  08/09/2020  Rich, OTR/L  Acute Rehabilitation Services  Office:  [place] 08/09/2020, 4:33 PM

## 2020-08-10 LAB — CBC
HCT: 34.8 % — ABNORMAL LOW (ref 36.0–46.0)
Hemoglobin: 11.3 g/dL — ABNORMAL LOW (ref 12.0–15.0)
MCH: 27.8 pg (ref 26.0–34.0)
MCHC: 32.5 g/dL (ref 30.0–36.0)
MCV: 85.5 fL (ref 80.0–100.0)
Platelets: 230 10*3/uL (ref 150–400)
RBC: 4.07 MIL/uL (ref 3.87–5.11)
RDW: 13.1 % (ref 11.5–15.5)
WBC: 7.7 10*3/uL (ref 4.0–10.5)
nRBC: 0 % (ref 0.0–0.2)

## 2020-08-10 LAB — BASIC METABOLIC PANEL
Anion gap: 15 (ref 5–15)
BUN: 21 mg/dL (ref 8–23)
CO2: 25 mmol/L (ref 22–32)
Calcium: 8.1 mg/dL — ABNORMAL LOW (ref 8.9–10.3)
Chloride: 101 mmol/L (ref 98–111)
Creatinine, Ser: 1.12 mg/dL — ABNORMAL HIGH (ref 0.44–1.00)
GFR, Estimated: 46 mL/min — ABNORMAL LOW (ref >60)
Glucose, Bld: 140 mg/dL — ABNORMAL HIGH (ref 70–99)
Potassium: 3.1 mmol/L — ABNORMAL LOW (ref 3.5–5.1)
Sodium: 141 mmol/L (ref 135–145)

## 2020-08-10 LAB — GLUCOSE, CAPILLARY
Glucose-Capillary: 142 mg/dL — ABNORMAL HIGH (ref 70–99)
Glucose-Capillary: 156 mg/dL — ABNORMAL HIGH (ref 70–99)
Glucose-Capillary: 161 mg/dL — ABNORMAL HIGH (ref 70–99)
Glucose-Capillary: 209 mg/dL — ABNORMAL HIGH (ref 70–99)

## 2020-08-10 LAB — TSH: TSH: 0.288 u[IU]/mL — ABNORMAL LOW (ref 0.350–4.500)

## 2020-08-10 MED ORDER — POTASSIUM CHLORIDE CRYS ER 20 MEQ PO TBCR
40.0000 meq | EXTENDED_RELEASE_TABLET | Freq: Two times a day (BID) | ORAL | Status: AC
  Administered 2020-08-10 (×2): 40 meq via ORAL
  Filled 2020-08-10 (×2): qty 2

## 2020-08-10 MED ORDER — WHITE PETROLATUM EX OINT
TOPICAL_OINTMENT | CUTANEOUS | Status: AC
  Filled 2020-08-10: qty 28.35

## 2020-08-10 MED ORDER — POTASSIUM CHLORIDE CRYS ER 20 MEQ PO TBCR: 40.0000 meq | EXTENDED_RELEASE_TABLET | Freq: Two times a day (BID) | ORAL | Status: DC

## 2020-08-10 MED ORDER — DONEPEZIL HCL 5 MG PO TABS
5.0000 mg | ORAL_TABLET | Freq: Every day | ORAL | Status: DC
  Administered 2020-08-10 – 2020-08-11 (×2): 5 mg via ORAL
  Filled 2020-08-10 (×2): qty 1

## 2020-08-10 NOTE — Plan of Care (Signed)

## 2020-08-10 NOTE — Progress Notes (Addendum)
Subjective:   Hospital day: 2  PT notes reviewed--recommending SNF at discharge OT notes reviewed--recommending 24h supervision via SNF vs ALF  Overnight event: no acute event  Doing well this morning. Still notes some urinary hesitancy.  Patient states that she has not had urine output since yesterday afternoon.  Per RN, patient did void yesterday in the bathroom and could not record the output.  Will check a bladder scan this afternoon.  Discussed discharge disposition. Pt expresses that she would like to go for some rehabilitation prior to returning home.  I called and spoke to the patient's daughter, Gwenlyn Saran.  I explained to her that we are treating patient for UTI and patient is doing better.  Regarding discharge plan, both patient and daughter agree with SNF and would like to go to the [facility].  I also discussed with [name] regarding her CODE STATUS.  I explained to her that resuscitation with CPR can be very painful and might increase suffering for the patient.  [name] understood and would like to make patient DNR.  All questions were answered.  Objective:  Vital signs in last 24 hours: Vitals:   08/09/20 1025 08/09/20 1334 08/09/20 1958 08/10/20 0528  BP: 137/71 139/70 132/70 126/74  Pulse: 75 75 75 74  Resp: 18 18 18 16   Temp: 98.5 F (36.9 C) 98.1 F (36.7 C) 98.3 F (36.8 C) 98 F (36.7 C)  TempSrc: Oral Oral Oral Oral  SpO2: 97% 98% 97% 96%  Weight:      Height:       CBC Latest Ref Rng & Units 08/10/2020 08/09/2020 08/08/2020  WBC 4.0 - 10.5 K/uL 7.7 6.4 8.7  Hemoglobin 12.0 - 15.0 g/dL 11.3(L) 12.8 12.9  Hematocrit 36 - 46 % 34.8(L) 39.2 39.7  Platelets 150 - 400 K/uL 230 242 255   CMP Latest Ref Rng & Units 08/10/2020 08/09/2020 08/08/2020  Glucose 70 - 99 mg/dL 140(H) 132(H) 122(H)  BUN 8 - 23 mg/dL 21 19 24(H)  Creatinine 0.44 - 1.00 mg/dL 1.12(H) 1.25(H) 2.39(H)  Sodium 135 - 145 mmol/L 141 141 143  Potassium 3.5 - 5.1 mmol/L 3.1(L) 3.1(L) 4.3    Chloride 98 - 111 mmol/L 101 101 104  CO2 22 - 32 mmol/L 25 23 21(L)  Calcium 8.9 - 10.3 mg/dL 8.1(L) 8.5(L) 8.9  Total Protein 6.5 - 8.1 g/dL - - -  Total Bilirubin 0.3 - 1.2 mg/dL - - -  Alkaline Phos 38 - 126 U/L - - -  AST 15 - 41 U/L - - -  ALT 0 - 44 U/L - - -     Physical Exam  General: chronically ill appearing in NAD Cardiac: Regular rate and rhythm, no lower extremity edema Pulm: breathing comfortably on room air, mild crackles at bilateral lung bases Abd: mild tenderness of left lower quadrant   Assessment/Plan: [name] is a 86 year old woman with history of diabetes mellitus, hypertension, hyperlipidemia, severe pulmonary HTN, chronic hypoxic respiratory failure, complete heart block with PPM, breast cancer (2015) who presented to the ED with generalized weakness and admitted for sepsislikely secondary to UTI.  Principal Problem:   Severe sepsis ([place]) Active Problems:   Acute UTI (urinary tract infection)   DM type 2 (diabetes mellitus, type 2) (HCC)   Acute encephalopathy   Generalized weakness   Acute kidney injury ([place])  Sepsis-resolved Acute encephalopathy-mental status back to baseline Urinary tract infection Generalized weakness - SNF placement Patient complains with hesitancies and difficulty urinating,  which is likely related to UTI.  Post void bladder scan shows 30 cc. Will continue IV ceftriaxone. She is afebrile in the last 24h with no elevated WBC.  - continue ceftriaxone (day 3) - Urine culture show multiple species - Blood culture no growth todate - Delirium precautions - Fall precautions   Crackles of lung bases Patient is not on diuretic at home.  Patient has been afebrile since admission.  Chest x-ray on admission shows no active disease. If she starts to spike fever or worsening aspiratory status, will obtain chest x-ray to evaluate for possible effusion vs aspiration pneumonia secondary to her dysphagia -Can try incentive  spirometry   Dysphagia Swallow study shows mild aspiration with thin liquid. -Dysphagia 2 diet   Acute kidney injury-improving Suspect pre-renal in setting of dehydration and sepsis.Renal ultrasoundshowed normal anatomy with no hydronephrosis. Creatinine improved from 1.25 to 1.12. - Encourage PO intake - BMP in am   Diabetes mellitus On metformin 500 mg twice daily at home. A1c 6.5% 9 months ago. CBG acceptable - Hold home metformin in s/o AKI - SSI - Monitor CBG   Hypertension - Hold home losartan and amlodipine in AKI and normal blood pressure - Can consider restarting amlodipine if blood pressure elevated   Hyperlipidemia - Continue home atorvastatin 40 mg daily   Chronic hypoxic respiratory failure Severe pulmonary hypertension Per most recent PCP note in Care Everywhere from 06/24/20, patient on home oxygen with exertion. -Currently satting well on room air   Mass on palate Quarter-sized soft-appearing mass in the middle of the palatenoted on exam. Given patient's history of snuff use, consider outpatient ENT evaluation.   Diet:Dysphagia 2 UXL:[id number] IVF:NA Code:DNR  Prior to Admission Living Arrangement:Home Anticipated Discharge Location:SNF placement Barriers to Discharge:Medical management  Gaylan Gerold, DO 08/10/2020, 6:49 AM Pager: [phone] After 5pm on weekdays and 1pm on weekends: On Call pager [phone]

## 2020-08-10 NOTE — TOC Initial Note (Signed)
Transition of Care [facility]) - Initial/Assessment Note    Patient Details  Name: Maria [name] MRN: [id number] Date of Birth: Jun 22, 1924  Transition of Care [facility]) CM/SW Contact:    Bary Castilla, LCSW Phone Number: [phone] 08/10/2020, 11:14 AM  Clinical Narrative:                  CSW met with patient and spoke with patient's daughter Maria [name] via phone. CSW explained the recommendation of the SNF. Both were in agreement with the recommendation. [name] explained that patient had been in [facility] and [facility] before and prefers [facility] with a private room. CSW left the medicare.gov rating list in patient's room and Maria [name] would be visiting today.  Maria [name] has requested that referrals only be sent to facilities in [place].Please follow up with Maria [name] due to her making bed choice.  TOC team will continue to assist with discharge planning needs.  Expected Discharge Plan: Skilled Nursing Facility Barriers to Discharge: SNF Pending bed offer, Continued Medical Work up   Patient Goals and CMS Choice Patient states their goals for this hospitalization and ongoing recovery are:: To be able to walk on own CMS Medicare.gov Compare Post Acute Care list provided to:: Patient    Expected Discharge Plan and Services Expected Discharge Plan: [place] arrangements for the past 2 months: Single Family Home                                      Prior Living Arrangements/Services Living arrangements for the past 2 months: Single Family Home Lives with:: Self, Relatives Biomedical scientist) Patient language and need for interpreter reviewed:: Yes Do you feel safe going back to the place where you live?: Yes        Care giver support system in place?: Yes (comment)      Activities of Daily Living      Permission Sought/Granted Permission sought to share information with : Family Supports Permission granted to share information with : Yes, Verbal Permission  Granted  Share Information with NAME: Maria [name]  Permission granted to share info w AGENCY: SNFs  Permission granted to share info w Relationship: Daughter  Permission granted to share info w Contact Information: [phone]  Emotional Assessment Appearance:: Appears younger than stated age Attitude/Demeanor/Rapport: Engaged Affect (typically observed): Accepting, Adaptable Orientation: : Oriented to Self, Oriented to Place      Admission diagnosis:  AKI (acute kidney injury) ([place]) [N17.9] Acute kidney injury ([place]) [N17.9] Acute UTI (urinary tract infection) [N39.0] Severe sepsis ([place]) [A41.9, R65.20] Patient Active Problem List   Diagnosis Date Noted  . Severe sepsis ([place]) 08/08/2020  . Acute kidney injury ([place]) 08/08/2020  . Acute UTI (urinary tract infection) 08/08/2020  . Elevated lactic acid level 10/17/2019  . Generalized weakness 10/17/2019  . Hypokalemia 10/17/2019  . Dehydration 10/17/2019  . Acute encephalopathy 10/16/2019  . Heart block AV complete ([place]) 04/13/2019  . Acute acalculous cholecystitis 10/07/2016  . Elevated troponin 10/07/2016  . Lactic acidosis 10/07/2016  . Sinus tachycardia 10/07/2016  . Dyslipidemia 06/05/2013  . Cardiac pacemaker 05/30/2013  . DM type 2 (diabetes mellitus, type 2) ([place]) 05/21/2013  . Chest pain 05/20/2013  . HTN (hypertension) 05/20/2013   PCP:  Audley Hose, MD Pharmacy:   [facility] [address] - [place], [place]  [place] [address] Phone: [phone] Fax: [phone]  [facility] [phone] - [place], [place] [facility] [address] AT [place] [address] Phone: [phone] Fax: [phone]     Social Determinants of Health (SDOH) Interventions    Readmission Risk Interventions No flowsheet data found.

## 2020-08-10 NOTE — NC FL2 (Signed)
[place] LEVEL OF CARE SCREENING TOOL     IDENTIFICATION  Patient Name: Maria [name] Birthdate: 30-Jul-1924 Sex: female Admission Date (Current Location): 08/07/2020  [facility] and [place] Number:  Herbalist and Address:  [facility] [place], [place] [address] [place], [place] [address]      Provider Number: [id number]  Attending Physician Name and Address:  Sid Falcon, MD  Relative Name and Phone Number:  [name] [phone]    Current Level of Care: Hospital Recommended Level of Care: [place] Prior Approval Number:    Date Approved/Denied:   PASRR Number: [id number]  Discharge Plan: SNF    Current Diagnoses: Patient Active Problem List   Diagnosis Date Noted  . Severe sepsis ([place]) 08/08/2020  . Acute kidney injury ([place]) 08/08/2020  . Acute UTI (urinary tract infection) 08/08/2020  . Elevated lactic acid level 10/17/2019  . Generalized weakness 10/17/2019  . Hypokalemia 10/17/2019  . Dehydration 10/17/2019  . Acute encephalopathy 10/16/2019  . Heart block AV complete ([place]) 04/13/2019  . Acute acalculous cholecystitis 10/07/2016  . Elevated troponin 10/07/2016  . Lactic acidosis 10/07/2016  . Sinus tachycardia 10/07/2016  . Dyslipidemia 06/05/2013  . Cardiac pacemaker 05/30/2013  . DM type 2 (diabetes mellitus, type 2) ([place]) 05/21/2013  . Chest pain 05/20/2013  . HTN (hypertension) 05/20/2013    Orientation RESPIRATION BLADDER Height & Weight     Self, Time  Normal Continent, External catheter Weight: 138 lb 3.7 oz (62.7 kg) Height:  5\' 4"  (162.6 cm)  BEHAVIORAL SYMPTOMS/MOOD NEUROLOGICAL BOWEL NUTRITION STATUS      Continent Diet (see discharge summary)  AMBULATORY STATUS COMMUNICATION OF NEEDS Skin   Limited Assist Verbally Normal                       Personal Care Assistance Level of Assistance  Bathing, Feeding, Dressing, Total care Bathing Assistance: Limited assistance Feeding  assistance: Independent Dressing Assistance: Limited assistance Total Care Assistance: Limited assistance   Functional Limitations Info  Sight, Hearing, Speech Sight Info: Adequate Hearing Info: Adequate Speech Info: Impaired    SPECIAL CARE FACTORS FREQUENCY  PT (By licensed PT), OT (By licensed OT), Speech therapy     PT Frequency: 5x per week OT Frequency: 5x per week     Speech Therapy Frequency: received while in the hospital      Contractures Contractures Info: Not present    Additional Factors Info  Code Status, Allergies, Insulin Sliding Scale Code Status Info: partial Allergies Info: Vancomycin   Insulin Sliding Scale Info: insulin aspart (novoLOG) injection 0-9 Units       Current Medications (08/10/2020):  This is the current hospital active medication list Current Facility-Administered Medications  Medication Dose Route Frequency Provider Last Rate Last Admin  . acetaminophen (TYLENOL) tablet 650 mg  650 mg Oral Q6H PRN Maudie Mercury, MD   650 mg at 08/10/20 9379   Or  . acetaminophen (TYLENOL) suppository 650 mg  650 mg Rectal Q6H PRN Maudie Mercury, MD      . atorvastatin (LIPITOR) tablet 40 mg  40 mg Oral QHS Maudie Mercury, MD   40 mg at 08/09/20 2241  . cefTRIAXone (ROCEPHIN) 1 g in sodium chloride 0.9 % 100 mL IVPB  1 g Intravenous Q24H Maudie Mercury, MD 200 mL/hr at 08/09/20 2255 1 g at 08/09/20 2255  . donepezil (ARICEPT) tablet 5 mg  5 mg Oral QHS Gaylan Gerold, DO      .  heparin injection 5,000 Units  5,000 Units Subcutaneous Q8H Maudie Mercury, MD   5,000 Units at 08/10/20 0601  . insulin aspart (novoLOG) injection 0-9 Units  0-9 Units Subcutaneous TID AC & HS Sid Falcon, MD   1 Units at 08/10/20 0758  . potassium chloride SA (KLOR-CON) CR tablet 40 mEq  40 mEq Oral BID Gaylan Gerold, DO   40 mEq at 08/10/20 0915  . senna-docusate (Senokot-S) tablet 1 tablet  1 tablet Oral QHS PRN Maudie Mercury, MD         Discharge  Medications: Please see discharge summary for a list of discharge medications.  Relevant Imaging Results:  Relevant Lab Results:   Additional Information SSN [place], [place]

## 2020-08-11 ENCOUNTER — Encounter (HOSPITAL_COMMUNITY): Payer: Self-pay | Admitting: Internal Medicine

## 2020-08-11 LAB — BASIC METABOLIC PANEL
Anion gap: 12 (ref 5–15)
BUN: 20 mg/dL (ref 8–23)
CO2: 24 mmol/L (ref 22–32)
Calcium: 8.4 mg/dL — ABNORMAL LOW (ref 8.9–10.3)
Chloride: 107 mmol/L (ref 98–111)
Creatinine, Ser: 1.25 mg/dL — ABNORMAL HIGH (ref 0.44–1.00)
GFR, Estimated: 40 mL/min — ABNORMAL LOW (ref >60)
Glucose, Bld: 150 mg/dL — ABNORMAL HIGH (ref 70–99)
Potassium: 4.3 mmol/L (ref 3.5–5.1)
Sodium: 143 mmol/L (ref 135–145)

## 2020-08-11 LAB — CBC
HCT: 34.5 % — ABNORMAL LOW (ref 36.0–46.0)
Hemoglobin: 11.1 g/dL — ABNORMAL LOW (ref 12.0–15.0)
MCH: 27.8 pg (ref 26.0–34.0)
MCHC: 32.2 g/dL (ref 30.0–36.0)
MCV: 86.3 fL (ref 80.0–100.0)
Platelets: 243 10*3/uL (ref 150–400)
RBC: 4 MIL/uL (ref 3.87–5.11)
RDW: 13.2 % (ref 11.5–15.5)
WBC: 6.2 10*3/uL (ref 4.0–10.5)
nRBC: 0 % (ref 0.0–0.2)

## 2020-08-11 LAB — GLUCOSE, CAPILLARY
Glucose-Capillary: 192 mg/dL — ABNORMAL HIGH (ref 70–99)
Glucose-Capillary: 122 mg/dL — ABNORMAL HIGH (ref 70–99)
Glucose-Capillary: 181 mg/dL — ABNORMAL HIGH (ref 70–99)
Glucose-Capillary: 199 mg/dL — ABNORMAL HIGH (ref 70–99)

## 2020-08-11 LAB — T4, FREE: Free T4: 0.99 ng/dL (ref 0.61–1.12)

## 2020-08-11 MED ORDER — INSULIN GLARGINE 100 UNIT/ML ~~LOC~~ SOLN: 10.0000 [IU] | Freq: Every day | SUBCUTANEOUS | Status: DC

## 2020-08-11 MED ORDER — POLYETHYLENE GLYCOL 3350 17 G PO PACK
17.0000 g | PACK | Freq: Every day | ORAL | Status: DC
  Administered 2020-08-12: 17 g via ORAL
  Filled 2020-08-11: qty 1

## 2020-08-11 MED ORDER — INSULIN GLARGINE 100 UNIT/ML ~~LOC~~ SOLN
5.0000 [IU] | Freq: Every day | SUBCUTANEOUS | Status: DC
  Administered 2020-08-11 – 2020-08-12 (×2): 5 [IU] via SUBCUTANEOUS
  Filled 2020-08-11 (×3): qty 0.05

## 2020-08-11 NOTE — Progress Notes (Signed)
Informed that pt had 11 beat nonsustained run of Vtach at 1434. (pt has been having PVCs multifocal, and pairs)   NT was in with pt during this time, at 1433 as pt's vital signs were taken and pt had been up to the bathroom during this time.  NT reports pt had no c/o at that time.  Pt denies having SOB or CP at any time during this time frame. Pt currently resting in bed with no c/o.   Dr Gaylan Gerold paged and notified of the above. No new orders at this time.  Will notify MD if this occurs again.

## 2020-08-11 NOTE — Progress Notes (Signed)
   Subjective:   Hospital day: 3  Overnight event: no acute event Feeling well this morning. No complaints.  Objective:  Vital signs in last 24 hours: Vitals:   08/09/20 1958 08/10/20 0528 08/10/20 1427 08/10/20 2117  BP: 132/70 126/74 (!) 117/56 115/60  Pulse: 75 74 (!) 59 60  Resp: 18 16 20 17   Temp: 98.3 F (36.8 C) 98 F (36.7 C) 98.7 F (37.1 C) 98.1 F (36.7 C)  TempSrc: Oral Oral Oral Oral  SpO2: 97% 96% 100% 100%  Weight:      Height:        Physical Exam General: chronically ill appearing Abd: no abdominal pain on palpation Pulm: breathing comfortably on room air. Lungs clear.  Assessment/Plan: [name] is a 86 year old woman with history of diabetes mellitus, hypertension, hyperlipidemia, severe pulmonary HTN, chronic hypoxic respiratory failure, complete heart block with PPM, breast cancer (2015) who presented to the ED with generalized weakness and admitted for sepsislikely secondary to UTI.  Principal Problem:   Severe sepsis ([place]) Active Problems:   Acute UTI (urinary tract infection)   DM type 2 (diabetes mellitus, type 2) (HCC)   Acute encephalopathy   Generalized weakness   Acute kidney injury ([place])  Uncomplicated Urinary tract infection. Continue rocephin (day 5/5)  Physical deconditioning. PT/OT recommending SNF rehabilitation. Appreciate TOC's assistance.  Dysphagia. Continue dysphagia 2 diet per SLP recs 11/26  Acute kidney injury-improving. Suspect poor oral intake is precluding resolution. -Encourage PO intake - BMP in am  Type 2 Diabetes mellitus. CBGs above goal. Add 5U lantus daily. Continue SSI.  Hypertension with hyperlipidemia. Normotensive at this time. Continue holding home amlodipine and losartan. Continue statin.  Mass on palate Quarter-sized soft-appearing mass in the middle of the palatenoted on exam. Given patient's history of snuff use, consider outpatient ENT evaluation.   Diet:Dysphagia  2 WVT:VNRWCHJ IVF:NA Code:DNR  Prior to Admission Living Arrangement:Home Anticipated Discharge Location:SNF placement Barriers to Discharge:Medical management  [name], MD Internal Medicine Resident PGY-2 [facility] Pager: [phone] 08/11/2020 10:44 AM    After 5pm on weekdays and 1pm on weekends: On Call pager [phone]

## 2020-08-11 NOTE — Progress Notes (Signed)
Occupational Therapy Treatment Patient Details Name: Maria [name] MRN: [id number] DOB: 09-09-1924 Today's Date: 08/11/2020    History of present illness [name] is a 86 year old woman with history of diabetes mellitus, hypertension, hyperlipidemia, severe pulmonary HTN, chronic hypoxic respiratory failure, complete heart block with PPM, breast cancer (2015) who presented to the ED with generalized weakness and admitted for sepsis likely secondary to UTI.   OT comments  Pt performing OOB ADL at sink and at recliner. Pt stood for toilet hygiene and stood for grooming at sink ~3 mins a t a time. Ptpt limited by decreased activity tolerance and decreased ability to care for self. Pt minA overall for ADL and mobility. Pt requiring power up assist for transfers. Pt would greatly benefit from continued OT skilled services. OT following acutely.    Follow Up Recommendations  SNF;Home health OT;Supervision/Assistance - 24 hour    Equipment Recommendations  3 in 1 bedside commode    Recommendations for Other Services      Precautions / Restrictions Precautions Precautions: Fall Restrictions Weight Bearing Restrictions: No       Mobility Bed Mobility Overal bed mobility: Needs Assistance Bed Mobility: Sit to Supine;Supine to Sit     Supine to sit: Min guard Sit to supine: Supervision   General bed mobility comments: no physical assist required; pt using rails  Transfers Overall transfer level: Needs assistance Equipment used: Rolling walker (2 wheeled) Transfers: Sit to/from [facility] Sit to Stand: Min assist Stand pivot transfers: Min guard       General transfer comment: use of rails and RW for stability; minA from bed to stands    Balance Overall balance assessment: Needs assistance Sitting-balance support: No upper extremity supported;Feet supported Sitting balance-Leahy Scale: Fair     Standing balance support: Bilateral upper extremity  supported;During functional activity Standing balance-Leahy Scale: Poor Standing balance comment: reliant on RW for support                           ADL either performed or assessed with clinical judgement   ADL Overall ADL's : Needs assistance/impaired Eating/Feeding: Set up   Grooming: Wash/dry hands;Wash/dry face;Oral care;Set up;Sitting;Standing Grooming Details (indicate cue type and reason): seated for washing face; pt stood for brushing teeth                 Toilet Transfer: Minimal assistance;Ambulation;Grab bars;RW Armed forces technical officer Details (indicate cue type and reason): assist for power up and to avoid plopping  Toileting- Clothing Manipulation and Hygiene: Min guard;Cueing for safety;Sitting/lateral lean;Sit to/from stand Toileting - Clothing Manipulation Details (indicate cue type and reason): standing at commode     Functional mobility during ADLs: Minimal assistance;Cueing for safety;Rolling walker General ADL Comments: pt limited by decreased activity tolerance and decreased ability to care for self.     Vision   Additional Comments: wears glasses   Perception     Praxis      Cognition Arousal/Alertness: Awake/alert Behavior During Therapy: Flat affect Overall Cognitive Status: History of cognitive impairments - at baseline                                 General Comments: Pt oriented to self, month and assist with year stating "1921" at first. Pt following all commands. Pt repeating herself stating "Is there a therapy place that I could stay here instad of going somewhere  else?"        Exercises     Shoulder Instructions       General Comments      Pertinent Vitals/ Pain       Pain Assessment: No/denies pain  Home Living                                          Prior Functioning/Environment              Frequency  Min 2X/week        Progress Toward Goals  OT Goals(current goals can  now be found in the care plan section)  Progress towards OT goals: Progressing toward goals  Acute Rehab OT Goals Patient Stated Goal: Patient wants to sleep. OT Goal Formulation: With patient Time For Goal Achievement: 08/22/20 Potential to Achieve Goals: Fair ADL Goals Pt Will Perform Grooming: with set-up;standing Pt Will Perform Lower Body Bathing: sit to/from stand;with min guard assist Pt Will Perform Lower Body Dressing: with set-up;sit to/from stand Pt Will Transfer to Toilet: with modified independence;ambulating;regular height toilet  Plan Discharge plan remains appropriate    Co-evaluation                 AM-PAC OT "6 Clicks" Daily Activity     Outcome Measure   Help from another person eating meals?: None Help from another person taking care of personal grooming?: A Little Help from another person toileting, which includes using toliet, bedpan, or urinal?: A Little Help from another person bathing (including washing, rinsing, drying)?: A Lot Help from another person to put on and taking off regular upper body clothing?: A Little Help from another person to put on and taking off regular lower body clothing?: A Lot 6 Click Score: 17    End of Session Equipment Utilized During Treatment: Rolling walker  OT Visit Diagnosis: Unsteadiness on feet (R26.81);Muscle weakness (generalized) (M62.81);History of falling (Z91.81);Other symptoms and signs involving cognitive function   Activity Tolerance Patient tolerated treatment well   Patient Left in bed;with call bell/phone within reach;with bed alarm set   Nurse Communication Mobility status        Time: 6213-0865 OT Time Calculation (min): 29 min  Charges: OT General Charges $OT Visit: 1 Visit OT Treatments $Self Care/Home Management : 23-37 mins   Jefferey Pica, OTR/L Acute Rehabilitation Services Pager: [phone] Office: [phone]    Maria [name] 08/11/2020, 1:27 PM

## 2020-08-11 NOTE — Progress Notes (Signed)
Physical Therapy Treatment Patient Details Name: Maria [name] MRN: [id number] DOB: [birth year] Today's Date: 08/11/2020    History of Present Illness Maria [name] is a 86 year old woman with history of diabetes mellitus, hypertension, hyperlipidemia, severe pulmonary HTN, chronic hypoxic respiratory failure, complete heart block with PPM, breast cancer (2015) who presented to the ED with generalized weakness and admitted for sepsis likely secondary to UTI.    PT Comments    Pt was seen for trip to BR and to walk a short trip in room, mildly unsteady and asking to do more.  PT set limit and returned pt to bed due to fatigue, did not do further work at that time.  Pt is not feeling SOB or bad, just tired from the effort to use BR and walk a short trip in the room.  Follow for acute goals of PT and make progress with her gait distances as she tolerates.     Follow Up Recommendations  SNF     Equipment Recommendations  None recommended by PT    Recommendations for Other Services       Precautions / Restrictions Precautions Precautions: Fall Restrictions Weight Bearing Restrictions: No    Mobility  Bed Mobility Overal bed mobility: Needs Assistance Bed Mobility: Supine to Sit;Sit to Supine     Supine to sit: Min guard Sit to supine: Min guard   General bed mobility comments: no physical assist required; pt using rails  Transfers Overall transfer level: Needs assistance Equipment used: Rolling walker (2 wheeled) Transfers: Sit to/from Stand Sit to Stand: Min assist Stand pivot transfers: Min guard       General transfer comment: RW for support  Ambulation/Gait Ambulation/Gait assistance: Min guard Gait Distance (Feet): 60 Feet Assistive device: Rolling walker (2 wheeled) Gait Pattern/deviations: Step-through pattern;Decreased stride length;Trunk flexed;Narrow base of support Gait velocity: decreased Gait velocity interpretation: <1.31 ft/sec, indicative of  household ambulator General Gait Details: controlled   Chief Strategy Officer    Modified Rankin (Stroke Patients Only)       Balance Overall balance assessment: Needs assistance Sitting-balance support: Feet supported Sitting balance-Leahy Scale: Fair     Standing balance support: Bilateral upper extremity supported;During functional activity Standing balance-Leahy Scale: Poor Standing balance comment: cued support to stand and redress in BR                            Cognition Arousal/Alertness: Awake/alert Behavior During Therapy: Flat affect Overall Cognitive Status: History of cognitive impairments - at baseline                                        Exercises      General Comments General comments (skin integrity, edema, etc.): mobllity to walk to BR and around her room, cues for Hand placement      Pertinent Vitals/Pain Pain Assessment: No/denies pain    Home Living                      Prior Function            PT Goals (current goals can now be found in the care plan section) Acute Rehab PT Goals Patient Stated Goal: to rest more Progress towards PT goals: Progressing toward goals    Frequency  Min 3X/week      PT Plan Current plan remains appropriate    Co-evaluation              AM-PAC PT "6 Clicks" Mobility   Outcome Measure  Help needed turning from your back to your side while in a flat bed without using bedrails?: A Little Help needed moving from lying on your back to sitting on the side of a flat bed without using bedrails?: A Little Help needed moving to and from a bed to a chair (including a wheelchair)?: A Little Help needed standing up from a chair using your arms (e.g., wheelchair or bedside chair)?: A Little Help needed to walk in hospital room?: A Little Help needed climbing 3-5 steps with a railing? : A Lot 6 Click Score: 17    End of Session Equipment  Utilized During Treatment: Gait belt Activity Tolerance: Patient tolerated treatment well Patient left: in bed;with call bell/phone within reach;with bed alarm set Nurse Communication: Mobility status PT Visit Diagnosis: Other abnormalities of gait and mobility (R26.89);Unsteadiness on feet (R26.81);Muscle weakness (generalized) (M62.81)     Time: 1275-1700 PT Time Calculation (min) (ACUTE ONLY): 21 min  Charges:  $Gait Training: 8-22 mins                 Ramond Dial 08/11/2020, 8:44 PM  Mee Hives, PT MS Acute Rehab Dept. Number: [place] [facility] [place]

## 2020-08-11 NOTE — TOC Progression Note (Addendum)
Transition of Care [facility]) - Progression Note    Patient Details  Name: [name] MRN: [id number] Date of Birth: 1923-10-31  Transition of Care [facility]) CM/SW Contact  Jacalyn Lefevre Edson Snowball, RN Phone Number: 08/11/2020, 12:03 PM  Clinical Narrative:    Prior note states [name]'s preference is [facility]. [facility] did not offer. NCM called [facility] and left voicemail , awaiting call back.  NCM went to room to provide SNF bed offers. [name] currently not at bedside. Called [name] Dearman [phone] , someone answered but call got disconnected. NCM called back again [name] answered but call got disconnected again. NCM called back from a desk phone and call went to voicemail. NCM left direct call back number.   1600 called [name] again provided offers, she would like [facility]. Patient has had both pfizer vaccinations and booster. [name] requesting private room.  CAlled [facility], spoke to [facility] , they can accept patient tomorrow into a private room. Will need another covid test prior to discharge. Secure chatted MD.  [name] aware Asjia will transfer to [facility] tomorrow.   Expected Discharge Plan: [place] Barriers to Discharge: SNF Pending bed offer, Continued Medical Work up  Expected Discharge Plan and Services Expected Discharge Plan: [place] arrangements for the past 2 months: Single Family Home                                       Social Determinants of Health (SDOH) Interventions    Readmission Risk Interventions No flowsheet data found.

## 2020-08-12 DIAGNOSIS — R2689 Other abnormalities of gait and mobility: Secondary | ICD-10-CM | POA: Diagnosis present

## 2020-08-12 DIAGNOSIS — R6520 Severe sepsis without septic shock: Secondary | ICD-10-CM | POA: Diagnosis not present

## 2020-08-12 DIAGNOSIS — I10 Essential (primary) hypertension: Secondary | ICD-10-CM | POA: Diagnosis not present

## 2020-08-12 DIAGNOSIS — E119 Type 2 diabetes mellitus without complications: Secondary | ICD-10-CM | POA: Diagnosis present

## 2020-08-12 DIAGNOSIS — R2681 Unsteadiness on feet: Secondary | ICD-10-CM | POA: Diagnosis present

## 2020-08-12 DIAGNOSIS — Z7401 Bed confinement status: Secondary | ICD-10-CM | POA: Diagnosis not present

## 2020-08-12 DIAGNOSIS — E785 Hyperlipidemia, unspecified: Secondary | ICD-10-CM | POA: Diagnosis present

## 2020-08-12 DIAGNOSIS — N390 Urinary tract infection, site not specified: Secondary | ICD-10-CM | POA: Diagnosis present

## 2020-08-12 DIAGNOSIS — M6281 Muscle weakness (generalized): Secondary | ICD-10-CM | POA: Diagnosis present

## 2020-08-12 DIAGNOSIS — M2550 Pain in unspecified joint: Secondary | ICD-10-CM | POA: Diagnosis not present

## 2020-08-12 DIAGNOSIS — R278 Other lack of coordination: Secondary | ICD-10-CM | POA: Diagnosis present

## 2020-08-12 DIAGNOSIS — R5381 Other malaise: Secondary | ICD-10-CM | POA: Diagnosis not present

## 2020-08-12 DIAGNOSIS — Z950 Presence of cardiac pacemaker: Secondary | ICD-10-CM | POA: Diagnosis not present

## 2020-08-12 DIAGNOSIS — A419 Sepsis, unspecified organism: Secondary | ICD-10-CM | POA: Diagnosis not present

## 2020-08-12 LAB — BASIC METABOLIC PANEL
Anion gap: 15 (ref 5–15)
BUN: 19 mg/dL (ref 8–23)
CO2: 23 mmol/L (ref 22–32)
Calcium: 8.7 mg/dL — ABNORMAL LOW (ref 8.9–10.3)
Chloride: 104 mmol/L (ref 98–111)
Creatinine, Ser: 0.96 mg/dL (ref 0.44–1.00)
GFR, Estimated: 55 mL/min — ABNORMAL LOW (ref >60)
Glucose, Bld: 143 mg/dL — ABNORMAL HIGH (ref 70–99)
Potassium: 3.5 mmol/L (ref 3.5–5.1)
Sodium: 142 mmol/L (ref 135–145)

## 2020-08-12 LAB — T3, FREE: T3, Free: 2.7 pg/mL (ref 2.0–4.4)

## 2020-08-12 LAB — CBC
HCT: 35.1 % — ABNORMAL LOW (ref 36.0–46.0)
Hemoglobin: 11.4 g/dL — ABNORMAL LOW (ref 12.0–15.0)
MCH: 28 pg (ref 26.0–34.0)
MCHC: 32.5 g/dL (ref 30.0–36.0)
MCV: 86.2 fL (ref 80.0–100.0)
Platelets: 246 10*3/uL (ref 150–400)
RBC: 4.07 MIL/uL (ref 3.87–5.11)
RDW: 13.4 % (ref 11.5–15.5)
WBC: 7.7 10*3/uL (ref 4.0–10.5)
nRBC: 0 % (ref 0.0–0.2)

## 2020-08-12 LAB — SARS CORONAVIRUS 2 BY RT PCR (HOSPITAL ORDER, PERFORMED IN ~~LOC~~ HOSPITAL LAB): SARS Coronavirus 2: NEGATIVE

## 2020-08-12 LAB — GLUCOSE, CAPILLARY
Glucose-Capillary: 197 mg/dL — ABNORMAL HIGH (ref 70–99)
Glucose-Capillary: 176 mg/dL — ABNORMAL HIGH (ref 70–99)

## 2020-08-12 NOTE — Progress Notes (Addendum)
Subjective:   Hospital day: 4  Overnight event: I was notified yesterday afternoon that patient had 11 run of nonsustained V. tach.  Patient was asymptomatic at that time.  Patient have history of complete heart block with a pacemaker.  No additional V. tach observed on telemetry since yesterday.  Patient is seen at bedside.  She is somnolent but awake to verbal stimulation.  She stated that she is feeling fine with no complain.  Last bowel movement was this morning.  She denied constipation or diarrhea.  Objective:  Vital signs in last 24 hours: Vitals:   08/11/20 0535 08/11/20 1433 08/11/20 2130 08/12/20 0542  BP: 137/74 (!) 146/77 (!) 149/76 (!) 144/78  Pulse: 77 70 73 70  Resp: 16 16 17 17   Temp: 97.6 F (36.4 C)  98.6 F (37 C) 98.5 F (36.9 C)  TempSrc: Oral  Oral Oral  SpO2: 100% 99% 99% 99%  Weight:      Height:       CBC Latest Ref Rng & Units 08/12/2020 08/11/2020 08/10/2020  WBC 4.0 - 10.5 K/uL 7.7 6.2 7.7  Hemoglobin 12.0 - 15.0 g/dL 11.4(L) 11.1(L) 11.3(L)  Hematocrit 36 - 46 % 35.1(L) 34.5(L) 34.8(L)  Platelets 150 - 400 K/uL 246 243 230   CMP Latest Ref Rng & Units 08/12/2020 08/11/2020 08/10/2020  Glucose 70 - 99 mg/dL 143(H) 150(H) 140(H)  BUN 8 - 23 mg/dL 19 20 21   Creatinine 0.44 - 1.00 mg/dL 0.96 1.25(H) 1.12(H)  Sodium 135 - 145 mmol/L 142 143 141  Potassium 3.5 - 5.1 mmol/L 3.5 4.3 3.1(L)  Chloride 98 - 111 mmol/L 104 107 101  CO2 22 - 32 mmol/L 23 24 25   Calcium 8.9 - 10.3 mg/dL 8.7(L) 8.4(L) 8.1(L)  Total Protein 6.5 - 8.1 g/dL - - -  Total Bilirubin 0.3 - 1.2 mg/dL - - -  Alkaline Phos 38 - 126 U/L - - -  AST 15 - 41 U/L - - -  ALT 0 - 44 U/L - - -     Physical Exam  Physical Exam Constitutional:      General: She is not in acute distress. HENT:     Head: Normocephalic.  Eyes:     General:        Right eye: No discharge.        Left eye: No discharge.  Cardiovascular:     Rate and Rhythm: Normal rate and regular rhythm.    Pulmonary:     Effort: No respiratory distress.  Abdominal:     General: Bowel sounds are normal.     Palpations: Abdomen is soft.     Comments: Mild tenderness to palpation of lower quadrants  Musculoskeletal:     Right lower leg: No edema.     Left lower leg: No edema.     Assessment/Plan: Ms. Joceline Hinchcliff is a 86 year old woman with history of diabetes mellitus, hypertension, hyperlipidemia, severe pulmonary HTN, chronic hypoxic respiratory failure, complete heart block with PPM, breast cancer (2015) who presented to the ED with generalized weakness and admitted for sepsissecondary to UTI.  Principal Problem:   Severe sepsis ([place]) Active Problems:   Acute UTI (urinary tract infection)   DM type 2 (diabetes mellitus, type 2) (HCC)   Acute encephalopathy   Generalized weakness   Acute kidney injury ([place])  Uncomplicated Urinary tract infection.  Finish rocephin (day 5/5)  Physical deconditioning.  Patient will be going to a [facility] SNF today  Dysphagia. Continue dysphagia  2 diet per SLP recs 11/26  Acute kidney injury -resolved Creatinine normalized at 0.9  Type 2 Diabetes mellitus. Restart Metformin after discharge Hold Glimepiride given the risk of hypoglycemia.  This medication is not on the most up-to-date list of outpatient medication per cardiology notes on 07/30/2020.  Hypertension with hyperlipidemia. Can restart amlodipine and losartan.  Patient will need a repeat BMP in 1 week after restarting losartan with her PCP Continue atorvastatin 40 mg  Mass on palate Quarter-sized soft-appearing mass in the middle of the palatenoted on exam. Given patient's history of snuff use, consider outpatient ENT evaluation.   Diet:Dysphagia 2 PND:LOPRAFO IVF:NA Code:DNR  Prior to Admission Living Arrangement:Home Anticipated Discharge Location:SNF placement  Gaylan Gerold, DO 08/12/2020, 11:58 AM Pager: [phone] After 5pm on weekdays and 1pm on  weekends: On Call pager [phone]

## 2020-08-12 NOTE — Progress Notes (Signed)
Transport here at this time. Talked with son(Sam) on phone. Told him Senna was being DC at t his time.

## 2020-08-12 NOTE — Progress Notes (Signed)
[name] to be D/C'd per MD order. Discussed with the patient and all questions fully answered. ? VSS, Skin clean, dry and intact without evidence of skin break down, no evidence of skin tears noted. ? IV catheter discontinued intact. Site without signs and symptoms of complications. Dressing and pressure applied. ? An After Visit Summary was printed and placed in D/C packet.  ? D/C education completed with [facility] including follow up instructions, medication list, d/c activities limitations if indicated, with other d/c instructions as indicated by MD - patient able to verbalize understanding, all questions fully answered.  ? Patient instructed to return to ED, call 911, or call MD for any changes in condition.  ?

## 2020-08-12 NOTE — Care Management Important Message (Signed)
Important Message  Patient Details  Name: Maria [name] MRN: [id number] Date of Birth: 1923-11-07   Medicare Important Message Given:  Yes     Kailo Kosik 08/12/2020, 3:02 PM

## 2020-08-12 NOTE — TOC Progression Note (Signed)
Transition of Care [facility]) - Progression Note    Patient Details  Name: BLIMA JAIMES MRN: [id number] Date of Birth: 09/06/1924  Transition of Care [facility]) CM/SW Contact  Jacalyn Lefevre Edson Snowball, RN Phone Number: 08/12/2020, 1:35 PM  Clinical Narrative:     Patient for discharge today.   Daughter [name] aware and will do paperwork at [place] . Dem[facility]  [facility] [place] aware.   Bedside nurse to call report to [phone].  Expected Discharge Plan: [place] Barriers to Discharge: SNF Pending bed offer, Continued Medical Work up  Expected Discharge Plan and Services Expected Discharge Plan: [place] arrangements for the past 2 months: Single Family Home Expected Discharge Date: 08/12/20                                     Social Determinants of Health (SDOH) Interventions    Readmission Risk Interventions No flowsheet data found.

## 2020-08-12 NOTE — Progress Notes (Signed)
  Speech Language Pathology Treatment: Dysphagia  Patient Details Name: ENZLEY KITCHENS MRN: [id number] DOB: [birth year] Today's Date: 08/12/2020 Time: 3832-9191 SLP Time Calculation (min) (ACUTE ONLY): 10 min  Assessment / Plan / Recommendation Clinical Impression  Pt verbalizes good tolerance of dys 2 meals and reports that she has better digestion of dys 2 textures because otherwise she does not tolerate meat well. Though she can masticate, dys 2 textures are most beneficial at this time and into the longer term. No further SLP f/u needed, continue current diet.   HPI HPI: Ms. Tekelia Kareem is a 86 year old woman with history of diabetes mellitus, hypertension, hyperlipidemia, severe pulmonary HTN, chronic hypoxic respiratory failure, complete heart block with PPM, breast cancer (2015) who presented to the ED via EMS with generalized weakness and confusion.  Suspect confusion secondary to infection/sepsis especially given patient's mild dementia and imaging suggestive of prior CVA. Has been made NPO dur to prior admission on mech soft diet. SLP eval on 10/18/19 recommended soft diet due to confusion, swallow otherwise appears WNL. Also found to have Quarter-sized soft-appearing mass in the middle of the palate noted on MD exam. Lives in a single story home. Grandson stays with her at night, and son visits daily. Daughter manages her bills, groceries, cooking, bathing, doctor's appointments. Patient uses a walker and is able to dress and feed herself but does not bathe herself.       SLP Plan  All goals met       Recommendations  Diet recommendations: Dysphagia 2 (fine chop);Thin liquid Medication Administration: Crushed with puree Supervision: Patient able to self feed Compensations: Slow rate;Small sips/bites                Oral Care Recommendations: Oral care BID Follow up Recommendations: None Plan: All goals met       GO               Herbie Baltimore, MA CCC-SLP  Acute  Rehabilitation Services Pager [phone] Office [phone]  Lynann Beaver 08/12/2020, 10:29 AM

## 2020-08-12 NOTE — Discharge Summary (Addendum)
Name: Maria [name] MRN: [id number] DOB: 1924/07/27 86 y.o. PCP: Gaylan Gerold, DO  Date of Admission: 08/07/2020  7:34 PM Date of Discharge: 08/12/2020 Attending Physician: Lenice Pressman, MD, PhD   Discharge Diagnosis: 1.  Sepsis secondary to UTI 2.  Acute encephalopathy 3.  Generalized weakness 4.  Acute kidney injury 5.  Type 2 diabetes  Discharge Medications: Allergies as of 08/12/2020       Reactions   Vancomycin Itching        Medication List     STOP taking these medications    glimepiride 1 MG tablet Commonly known as: AMARYL   rosuvastatin 5 MG tablet Commonly known as: CRESTOR       TAKE these medications    acetaminophen 500 MG tablet Commonly known as: TYLENOL Take 500 mg by mouth daily.   amLODipine 10 MG tablet Commonly known as: NORVASC Take 10 mg by mouth daily.   Artificial Tears 1.4 % ophthalmic solution Generic drug: polyvinyl alcohol Place 1 drop into both eyes daily.   aspirin EC 81 MG tablet Take 81 mg by mouth daily.   atorvastatin 40 MG tablet Commonly known as: LIPITOR Take 40 mg by mouth at bedtime.   donepezil 5 MG tablet Commonly known as: ARICEPT Take 5 mg by mouth at bedtime.   freestyle lancets 1 each by Other route 3 (three) times a week.   FreeStyle Lite Devi 1 each by Other route 3 (three) times a week.   FREESTYLE LITE test strip Generic drug: glucose blood 1 each by Other route 3 (three) times a week.   latanoprost 0.005 % ophthalmic solution Commonly known as: XALATAN Place 1 drop into both eyes at bedtime.   losartan 50 MG tablet Commonly known as: COZAAR Take 50 mg by mouth daily.   meclizine 25 MG tablet Commonly known as: ANTIVERT Take 25 mg by mouth 3 (three) times daily as needed (vertigo).   metFORMIN 500 MG tablet Commonly known as: GLUCOPHAGE Take 500 mg by mouth 2 (two) times daily with a meal.   nitroGLYCERIN 0.4 MG SL tablet Commonly known as: NITROSTAT Place 0.4 mg under  the tongue as needed for chest pain.   ondansetron 4 MG disintegrating tablet Commonly known as: Zofran ODT Take 1 tablet (4 mg total) by mouth every 8 (eight) hours as needed for nausea or vomiting.   PRESERVISION AREDS 2 PO Take 1 tablet by mouth daily.   Restasis 0.05 % ophthalmic emulsion Generic drug: cycloSPORINE Place 1 drop into both eyes daily.   traMADol 50 MG tablet Commonly known as: ULTRAM Take 50-100 mg by mouth every 6 (six) hours as needed for pain.   valACYclovir 1000 MG tablet Commonly known as: VALTREX Take 1,000 mg by mouth as needed (breakouts).        Disposition and follow-up:   Maria [name] was discharged from [facility] in Stable condition.  At the hospital follow up visit please address:  1.   PCP -Please review medication list after discharge.  Glimepiride and rosuvastatin was held because they were not on the most updated medication list per cardiology notes on 11/17/121.  Recommend stopping glimepiride given risk of hypoglycemia. -Please repeat BMP to evaluate for kidney function in 1 week after restarting losartan -Can consider ENT referral for the mass on her soft palate.   2.  Labs / imaging needed at time of follow-up: CBC, BMP, UA  3.  Pending labs/ test needing follow-up: NA  Follow-up Appointments:  Primary care physician in 1 week   Follow-up Information     Evans Lance, MD .   Specialty: Cardiology Contact information: [address] [place] [address] [place] Hospital Course by problem list: 1  Sepsis secondary to UTI Acute encephalopathy Generalized weakness Acute kidney injury Patient presented to the hospital for 2 weeks of increasing weakness and confusion.  Prior to admission, she had an episode of fall where she slipped out of her bed.  Patient also have suprapubic tenderness on exam.  CT head was unremarkable.  Urinalysis suggestive of  UTI with hematuria and pyuria, patient was started on ceftriaxone and continued for 5 days.  Her symptoms resolved and her mental status is back to baseline on discharge.  Her acute kidney injury, which was likely due to poor oral intake, also normalized with IV fluids and increase p.o. intake.  PT/OT have worked with her and recommended SNF.  Patient will be going to [facility] facility.   Discharge Vitals:   BP (!) 144/78 (BP Location: Left Arm)   Pulse 70   Temp 98.5 F (36.9 C) (Oral)   Resp 17   Ht 5\' 4"  (1.626 m)   Wt 62.7 kg   SpO2 99%   BMI 23.73 kg/m   Pertinent Labs, Studies, and Procedures:  CBC Latest Ref Rng & Units 08/12/2020 08/11/2020 08/10/2020  WBC 4.0 - 10.5 K/uL 7.7 6.2 7.7  Hemoglobin 12.0 - 15.0 g/dL 11.4(L) 11.1(L) 11.3(L)  Hematocrit 36 - 46 % 35.1(L) 34.5(L) 34.8(L)  Platelets 150 - 400 K/uL 246 243 230   CMP Latest Ref Rng & Units 08/12/2020 08/11/2020 08/10/2020  Glucose 70 - 99 mg/dL 143(H) 150(H) 140(H)  BUN 8 - 23 mg/dL 19 20 21   Creatinine 0.44 - 1.00 mg/dL 0.96 1.25(H) 1.12(H)  Sodium 135 - 145 mmol/L 142 143 141  Potassium 3.5 - 5.1 mmol/L 3.5 4.3 3.1(L)  Chloride 98 - 111 mmol/L 104 107 101  CO2 22 - 32 mmol/L 23 24 25   Calcium 8.9 - 10.3 mg/dL 8.7(L) 8.4(L) 8.1(L)  Total Protein 6.5 - 8.1 g/dL - - -  Total Bilirubin 0.3 - 1.2 mg/dL - - -  Alkaline Phos 38 - 126 U/L - - -  AST 15 - 41 U/L - - -  ALT 0 - 44 U/L - - -   CT Head Wo Contrast  Result Date: 08/07/2020 CLINICAL DATA:  Right leg weakness.  Slurred speech. EXAM: CT HEAD WITHOUT CONTRAST TECHNIQUE: Contiguous axial images were obtained from the base of the skull through the vertex without intravenous contrast. COMPARISON:  None. FINDINGS: Brain: Moderate atrophy and white matter changes are stable. Remote lacunar infarcts are present in the left internal capsule and right thalamus. White matter changes extend into the brainstem. The ventricles are proportionate to the degree of atrophy.  No significant extraaxial fluid collection is present. Cerebellum is unremarkable. Vascular: Atherosclerotic changes are present within the cavernous internal carotid arteries bilaterally. No hyperdense vessel is present. Skull: Calvarium is intact. No focal lytic or blastic lesions are present. No significant extracranial soft tissue lesion is present. Sinuses/Orbits: The paranasal sinuses and mastoid air cells are clear. Bilateral lens replacements are noted. Globes and orbits are otherwise unremarkable. IMPRESSION: 1. Stable atrophy and white matter disease. This likely reflects the sequela of chronic microvascular ischemia. 2. Remote lacunar infarcts of the left internal  capsule and right thalamus. 3. No acute intracranial abnormality or significant interval change. Electronically Signed   By: San Morelle M.D.   On: 08/07/2020 21:45   US RENAL  Result Date: 08/08/2020 CLINICAL DATA:  Acute kidney injury.  Hypertension and diabetes. EXAM: RENAL / URINARY TRACT ULTRASOUND COMPLETE COMPARISON:  CT 04/18/2020 FINDINGS: Right Kidney: Renal measurements: 9.3 x 5.2 x 4.9 cm = volume: 125 mL. Echogenicity within normal limits. No mass or hydronephrosis visualized. Left Kidney: Renal measurements: 11.2 x 5.1 x 5 cm = volume: 147 mL. Echogenicity within normal limits. No mass or hydronephrosis visualized. Bladder: Appears normal for degree of bladder distention. Other: None. IMPRESSION: Normal ultrasound appearance of the kidneys and bladder. No hydronephrosis. Electronically Signed   By: Lucienne Capers M.D.   On: 08/08/2020 02:29   DG Chest Portable 1 View  Result Date: 08/07/2020 CLINICAL DATA:  Weakness EXAM: PORTABLE CHEST 1 VIEW COMPARISON:  05/22/2020 FINDINGS: Left-sided pacing device as before. Chronic elevation right diaphragm. Borderline to mild cardiomegaly with aortic atherosclerosis. No consolidation or effusion. IMPRESSION: No active disease. Borderline to mild cardiomegaly.  Electronically Signed   By: Donavan Foil M.D.   On: 08/07/2020 20:34   Discharge Instructions: Discharge Instructions     Call MD for:  persistant nausea and vomiting   Complete by: As directed    Call MD for:  severe uncontrolled pain   Complete by: As directed    Diet - low sodium heart healthy   Complete by: As directed    Discharge instructions   Complete by: As directed    Maria [name], it is a pleasure taking care of you during this admission.  You were hospitalized for a urinary tract infection and was treated with antibiotic.  You will be going to a nursing facility for rehabilitation.  Take care   Increase activity slowly   Complete by: As directed        Signed: Gaylan Gerold, DO 08/12/2020, 12:15 PM   Pager: [phone]

## 2020-08-13 LAB — CULTURE, BLOOD (ROUTINE X 2)
Culture: NO GROWTH
Culture: NO GROWTH
Special Requests: ADEQUATE
Special Requests: ADEQUATE

## 2020-08-15 DIAGNOSIS — E119 Type 2 diabetes mellitus without complications: Secondary | ICD-10-CM | POA: Diagnosis not present

## 2020-08-15 DIAGNOSIS — Z950 Presence of cardiac pacemaker: Secondary | ICD-10-CM | POA: Diagnosis not present

## 2020-08-15 DIAGNOSIS — E785 Hyperlipidemia, unspecified: Secondary | ICD-10-CM | POA: Diagnosis not present

## 2020-08-15 DIAGNOSIS — A419 Sepsis, unspecified organism: Secondary | ICD-10-CM | POA: Diagnosis not present

## 2020-08-19 DIAGNOSIS — A419 Sepsis, unspecified organism: Secondary | ICD-10-CM | POA: Diagnosis not present

## 2020-08-26 DIAGNOSIS — E1165 Type 2 diabetes mellitus with hyperglycemia: Secondary | ICD-10-CM | POA: Diagnosis not present

## 2020-08-26 DIAGNOSIS — I10 Essential (primary) hypertension: Secondary | ICD-10-CM | POA: Diagnosis not present

## 2020-08-26 DIAGNOSIS — J9611 Chronic respiratory failure with hypoxia: Secondary | ICD-10-CM | POA: Diagnosis not present

## 2020-08-26 DIAGNOSIS — R6881 Early satiety: Secondary | ICD-10-CM | POA: Diagnosis not present

## 2020-08-26 DIAGNOSIS — M270 Developmental disorders of jaws: Secondary | ICD-10-CM | POA: Diagnosis not present

## 2020-08-26 DIAGNOSIS — Z758 Other problems related to medical facilities and other health care: Secondary | ICD-10-CM | POA: Diagnosis not present

## 2020-08-26 DIAGNOSIS — E785 Hyperlipidemia, unspecified: Secondary | ICD-10-CM | POA: Diagnosis not present

## 2020-08-27 DIAGNOSIS — G9340 Encephalopathy, unspecified: Secondary | ICD-10-CM | POA: Diagnosis not present

## 2020-08-27 DIAGNOSIS — Z7984 Long term (current) use of oral hypoglycemic drugs: Secondary | ICD-10-CM | POA: Diagnosis not present

## 2020-08-27 DIAGNOSIS — Z96653 Presence of artificial knee joint, bilateral: Secondary | ICD-10-CM | POA: Diagnosis not present

## 2020-08-27 DIAGNOSIS — E785 Hyperlipidemia, unspecified: Secondary | ICD-10-CM | POA: Diagnosis not present

## 2020-08-27 DIAGNOSIS — E119 Type 2 diabetes mellitus without complications: Secondary | ICD-10-CM | POA: Diagnosis not present

## 2020-08-27 DIAGNOSIS — M160 Bilateral primary osteoarthritis of hip: Secondary | ICD-10-CM | POA: Diagnosis not present

## 2020-08-27 DIAGNOSIS — R531 Weakness: Secondary | ICD-10-CM | POA: Diagnosis not present

## 2020-08-27 DIAGNOSIS — Z950 Presence of cardiac pacemaker: Secondary | ICD-10-CM | POA: Diagnosis not present

## 2020-08-27 DIAGNOSIS — I10 Essential (primary) hypertension: Secondary | ICD-10-CM | POA: Diagnosis not present

## 2020-08-27 DIAGNOSIS — Z87440 Personal history of urinary (tract) infections: Secondary | ICD-10-CM | POA: Diagnosis not present

## 2020-08-28 DIAGNOSIS — Z758 Other problems related to medical facilities and other health care: Secondary | ICD-10-CM | POA: Diagnosis not present

## 2020-08-28 DIAGNOSIS — J9611 Chronic respiratory failure with hypoxia: Secondary | ICD-10-CM | POA: Diagnosis not present

## 2020-08-28 DIAGNOSIS — E785 Hyperlipidemia, unspecified: Secondary | ICD-10-CM | POA: Diagnosis not present

## 2020-08-29 DIAGNOSIS — M160 Bilateral primary osteoarthritis of hip: Secondary | ICD-10-CM | POA: Diagnosis not present

## 2020-09-01 DIAGNOSIS — G9340 Encephalopathy, unspecified: Secondary | ICD-10-CM | POA: Diagnosis not present

## 2020-09-01 DIAGNOSIS — E119 Type 2 diabetes mellitus without complications: Secondary | ICD-10-CM | POA: Diagnosis not present

## 2020-09-01 DIAGNOSIS — M160 Bilateral primary osteoarthritis of hip: Secondary | ICD-10-CM | POA: Diagnosis not present

## 2020-09-01 DIAGNOSIS — I10 Essential (primary) hypertension: Secondary | ICD-10-CM | POA: Diagnosis not present

## 2020-09-01 DIAGNOSIS — R531 Weakness: Secondary | ICD-10-CM | POA: Diagnosis not present

## 2020-09-01 DIAGNOSIS — E785 Hyperlipidemia, unspecified: Secondary | ICD-10-CM | POA: Diagnosis not present

## 2020-09-03 DIAGNOSIS — M160 Bilateral primary osteoarthritis of hip: Secondary | ICD-10-CM | POA: Diagnosis not present

## 2020-09-03 DIAGNOSIS — G9340 Encephalopathy, unspecified: Secondary | ICD-10-CM | POA: Diagnosis not present

## 2020-09-03 DIAGNOSIS — E785 Hyperlipidemia, unspecified: Secondary | ICD-10-CM | POA: Diagnosis not present

## 2020-09-03 DIAGNOSIS — R531 Weakness: Secondary | ICD-10-CM | POA: Diagnosis not present

## 2020-09-03 DIAGNOSIS — E119 Type 2 diabetes mellitus without complications: Secondary | ICD-10-CM | POA: Diagnosis not present

## 2020-09-03 DIAGNOSIS — I10 Essential (primary) hypertension: Secondary | ICD-10-CM | POA: Diagnosis not present

## 2020-09-09 DIAGNOSIS — M160 Bilateral primary osteoarthritis of hip: Secondary | ICD-10-CM | POA: Diagnosis not present

## 2020-09-09 DIAGNOSIS — I10 Essential (primary) hypertension: Secondary | ICD-10-CM | POA: Diagnosis not present

## 2020-09-09 DIAGNOSIS — E785 Hyperlipidemia, unspecified: Secondary | ICD-10-CM | POA: Diagnosis not present

## 2020-09-09 DIAGNOSIS — G9340 Encephalopathy, unspecified: Secondary | ICD-10-CM | POA: Diagnosis not present

## 2020-09-09 DIAGNOSIS — E119 Type 2 diabetes mellitus without complications: Secondary | ICD-10-CM | POA: Diagnosis not present

## 2020-09-09 DIAGNOSIS — R531 Weakness: Secondary | ICD-10-CM | POA: Diagnosis not present

## 2020-09-11 DIAGNOSIS — E785 Hyperlipidemia, unspecified: Secondary | ICD-10-CM | POA: Diagnosis not present

## 2020-09-11 DIAGNOSIS — R531 Weakness: Secondary | ICD-10-CM | POA: Diagnosis not present

## 2020-09-11 DIAGNOSIS — M160 Bilateral primary osteoarthritis of hip: Secondary | ICD-10-CM | POA: Diagnosis not present

## 2020-09-11 DIAGNOSIS — I10 Essential (primary) hypertension: Secondary | ICD-10-CM | POA: Diagnosis not present

## 2020-09-11 DIAGNOSIS — E119 Type 2 diabetes mellitus without complications: Secondary | ICD-10-CM | POA: Diagnosis not present

## 2020-09-11 DIAGNOSIS — G9340 Encephalopathy, unspecified: Secondary | ICD-10-CM | POA: Diagnosis not present

## 2020-09-13 DIAGNOSIS — Z9181 History of falling: Secondary | ICD-10-CM | POA: Insufficient documentation

## 2020-09-13 DIAGNOSIS — R32 Unspecified urinary incontinence: Secondary | ICD-10-CM | POA: Insufficient documentation

## 2020-09-13 DIAGNOSIS — I6529 Occlusion and stenosis of unspecified carotid artery: Secondary | ICD-10-CM | POA: Insufficient documentation

## 2020-09-13 DIAGNOSIS — R339 Retention of urine, unspecified: Secondary | ICD-10-CM | POA: Insufficient documentation

## 2020-09-13 DIAGNOSIS — Z7984 Long term (current) use of oral hypoglycemic drugs: Secondary | ICD-10-CM | POA: Insufficient documentation

## 2020-09-17 DIAGNOSIS — E785 Hyperlipidemia, unspecified: Secondary | ICD-10-CM | POA: Diagnosis not present

## 2020-09-17 DIAGNOSIS — M160 Bilateral primary osteoarthritis of hip: Secondary | ICD-10-CM | POA: Diagnosis not present

## 2020-09-17 DIAGNOSIS — I10 Essential (primary) hypertension: Secondary | ICD-10-CM | POA: Diagnosis not present

## 2020-09-17 DIAGNOSIS — E119 Type 2 diabetes mellitus without complications: Secondary | ICD-10-CM | POA: Diagnosis not present

## 2020-09-17 DIAGNOSIS — R531 Weakness: Secondary | ICD-10-CM | POA: Diagnosis not present

## 2020-09-17 DIAGNOSIS — G9340 Encephalopathy, unspecified: Secondary | ICD-10-CM | POA: Diagnosis not present

## 2020-09-19 DIAGNOSIS — G9340 Encephalopathy, unspecified: Secondary | ICD-10-CM | POA: Diagnosis not present

## 2020-09-19 DIAGNOSIS — M160 Bilateral primary osteoarthritis of hip: Secondary | ICD-10-CM | POA: Diagnosis not present

## 2020-09-19 DIAGNOSIS — I10 Essential (primary) hypertension: Secondary | ICD-10-CM | POA: Diagnosis not present

## 2020-09-19 DIAGNOSIS — E785 Hyperlipidemia, unspecified: Secondary | ICD-10-CM | POA: Diagnosis not present

## 2020-09-19 DIAGNOSIS — R531 Weakness: Secondary | ICD-10-CM | POA: Diagnosis not present

## 2020-09-19 DIAGNOSIS — E119 Type 2 diabetes mellitus without complications: Secondary | ICD-10-CM | POA: Diagnosis not present

## 2020-09-22 DIAGNOSIS — E785 Hyperlipidemia, unspecified: Secondary | ICD-10-CM | POA: Diagnosis not present

## 2020-09-22 DIAGNOSIS — G9340 Encephalopathy, unspecified: Secondary | ICD-10-CM | POA: Diagnosis not present

## 2020-09-22 DIAGNOSIS — E119 Type 2 diabetes mellitus without complications: Secondary | ICD-10-CM | POA: Diagnosis not present

## 2020-09-22 DIAGNOSIS — M160 Bilateral primary osteoarthritis of hip: Secondary | ICD-10-CM | POA: Diagnosis not present

## 2020-09-22 DIAGNOSIS — R531 Weakness: Secondary | ICD-10-CM | POA: Diagnosis not present

## 2020-09-22 DIAGNOSIS — I10 Essential (primary) hypertension: Secondary | ICD-10-CM | POA: Diagnosis not present

## 2020-09-24 DIAGNOSIS — E119 Type 2 diabetes mellitus without complications: Secondary | ICD-10-CM | POA: Diagnosis not present

## 2020-09-24 DIAGNOSIS — G9340 Encephalopathy, unspecified: Secondary | ICD-10-CM | POA: Diagnosis not present

## 2020-09-24 DIAGNOSIS — R531 Weakness: Secondary | ICD-10-CM | POA: Diagnosis not present

## 2020-09-24 DIAGNOSIS — E785 Hyperlipidemia, unspecified: Secondary | ICD-10-CM | POA: Diagnosis not present

## 2020-09-24 DIAGNOSIS — M160 Bilateral primary osteoarthritis of hip: Secondary | ICD-10-CM | POA: Diagnosis not present

## 2020-09-24 DIAGNOSIS — I10 Essential (primary) hypertension: Secondary | ICD-10-CM | POA: Diagnosis not present

## 2020-10-07 DIAGNOSIS — J9611 Chronic respiratory failure with hypoxia: Secondary | ICD-10-CM | POA: Diagnosis not present

## 2020-10-07 DIAGNOSIS — F0391 Unspecified dementia with behavioral disturbance: Secondary | ICD-10-CM | POA: Diagnosis not present

## 2020-10-07 DIAGNOSIS — E1165 Type 2 diabetes mellitus with hyperglycemia: Secondary | ICD-10-CM | POA: Diagnosis not present

## 2020-10-14 ENCOUNTER — Ambulatory Visit (INDEPENDENT_AMBULATORY_CARE_PROVIDER_SITE_OTHER): Payer: Medicare Other

## 2020-10-14 DIAGNOSIS — I442 Atrioventricular block, complete: Secondary | ICD-10-CM

## 2020-10-14 LAB — CUP PACEART REMOTE DEVICE CHECK
Battery Remaining Longevity: 119 mo
Battery Voltage: 3.02 V
Brady Statistic AP VP Percent: 19.65 %
Brady Statistic AP VS Percent: 0 %
Brady Statistic AS VP Percent: 77.57 %
Brady Statistic AS VS Percent: 2.78 %
Brady Statistic RA Percent Paced: 20.4 %
Brady Statistic RV Percent Paced: 97.22 %
Date Time Interrogation Session: 20220201030041
Implantable Lead Implant Date: 20030311
Implantable Lead Implant Date: 20030311
Implantable Lead Location: 753859
Implantable Lead Location: 753860
Implantable Lead Model: 4092
Implantable Lead Model: 4592
Implantable Pulse Generator Implant Date: 20200803
Lead Channel Impedance Value: 418 Ohm
Lead Channel Impedance Value: 456 Ohm
Lead Channel Impedance Value: 513 Ohm
Lead Channel Impedance Value: 551 Ohm
Lead Channel Pacing Threshold Amplitude: 0.375 V
Lead Channel Pacing Threshold Amplitude: 0.625 V
Lead Channel Pacing Threshold Pulse Width: 0.4 ms
Lead Channel Pacing Threshold Pulse Width: 0.4 ms
Lead Channel Sensing Intrinsic Amplitude: 19.375 mV
Lead Channel Sensing Intrinsic Amplitude: 19.375 mV
Lead Channel Sensing Intrinsic Amplitude: 3 mV
Lead Channel Sensing Intrinsic Amplitude: 3 mV
Lead Channel Setting Pacing Amplitude: 1.5 V
Lead Channel Setting Pacing Amplitude: 2.5 V
Lead Channel Setting Pacing Pulse Width: 0.4 ms
Lead Channel Setting Sensing Sensitivity: 4 mV

## 2020-10-22 NOTE — Progress Notes (Signed)
Remote pacemaker transmission.   

## 2020-11-03 DIAGNOSIS — I10 Essential (primary) hypertension: Secondary | ICD-10-CM | POA: Diagnosis not present

## 2020-11-03 DIAGNOSIS — R630 Anorexia: Secondary | ICD-10-CM | POA: Diagnosis not present

## 2020-11-03 DIAGNOSIS — R1319 Other dysphagia: Secondary | ICD-10-CM | POA: Diagnosis not present

## 2020-11-03 DIAGNOSIS — M159 Polyosteoarthritis, unspecified: Secondary | ICD-10-CM | POA: Diagnosis not present

## 2020-11-03 DIAGNOSIS — R42 Dizziness and giddiness: Secondary | ICD-10-CM | POA: Diagnosis not present

## 2020-11-03 DIAGNOSIS — E042 Nontoxic multinodular goiter: Secondary | ICD-10-CM | POA: Diagnosis not present

## 2020-11-03 DIAGNOSIS — E1165 Type 2 diabetes mellitus with hyperglycemia: Secondary | ICD-10-CM | POA: Diagnosis not present

## 2020-11-03 DIAGNOSIS — R339 Retention of urine, unspecified: Secondary | ICD-10-CM | POA: Diagnosis not present

## 2020-11-03 DIAGNOSIS — N390 Urinary tract infection, site not specified: Secondary | ICD-10-CM | POA: Diagnosis not present

## 2020-11-03 DIAGNOSIS — J9611 Chronic respiratory failure with hypoxia: Secondary | ICD-10-CM | POA: Diagnosis not present

## 2020-11-03 DIAGNOSIS — M160 Bilateral primary osteoarthritis of hip: Secondary | ICD-10-CM | POA: Diagnosis not present

## 2020-11-03 DIAGNOSIS — K224 Dyskinesia of esophagus: Secondary | ICD-10-CM | POA: Diagnosis not present

## 2020-11-04 ENCOUNTER — Other Ambulatory Visit: Payer: Self-pay

## 2020-11-04 ENCOUNTER — Ambulatory Visit (INDEPENDENT_AMBULATORY_CARE_PROVIDER_SITE_OTHER): Payer: Medicare Other | Admitting: Internal Medicine

## 2020-11-04 VITALS — BP 140/66 | HR 78 | Ht 64.0 in | Wt 146.6 lb

## 2020-11-04 DIAGNOSIS — Z950 Presence of cardiac pacemaker: Secondary | ICD-10-CM

## 2020-11-04 DIAGNOSIS — I442 Atrioventricular block, complete: Secondary | ICD-10-CM

## 2020-11-04 DIAGNOSIS — I10 Essential (primary) hypertension: Secondary | ICD-10-CM

## 2020-11-04 NOTE — Progress Notes (Signed)
HPI Maria [name] returns today for followup. She is a pleasant 87 yo woman with CHB, s/p PPM insertion. She underwent PM gen change out over a year ago. She has not had syncope. She denies peripheral edema. She admits to sleeping during the day and staying up much of the night. No chest pain.  Allergies  Allergen Reactions  . Vancomycin Itching     Current Outpatient Medications  Medication Sig Dispense Refill  . acetaminophen (TYLENOL) 500 MG tablet Take 500 mg by mouth daily.    Marland Kitchen amLODipine (NORVASC) 10 MG tablet Take 10 mg by mouth daily.    Marland Kitchen aspirin EC 81 MG tablet Take 81 mg by mouth daily.    . Blood Glucose Monitoring Suppl (FREESTYLE LITE) DEVI 1 each by Other route 3 (three) times a week.   0  . cycloSPORINE (RESTASIS) 0.05 % ophthalmic emulsion Place 1 drop into both eyes daily.     Marland Kitchen donepezil (ARICEPT) 5 MG tablet Take 5 mg by mouth at bedtime.    Marland Kitchen FREESTYLE LITE test strip 1 each by Other route 3 (three) times a week.   4  . glimepiride (AMARYL) 1 MG tablet Take 1 tablet by mouth in the morning and at bedtime.    . Lancets (FREESTYLE) lancets 1 each by Other route 3 (three) times a week.   4  . latanoprost (XALATAN) 0.005 % ophthalmic solution Place 1 drop into both eyes at bedtime.    . meclizine (ANTIVERT) 25 MG tablet Take 25 mg by mouth 3 (three) times daily as needed (vertigo).     . mirtazapine (REMERON) 7.5 MG tablet Take 7.5 mg by mouth at bedtime.    . nitroGLYCERIN (NITROSTAT) 0.4 MG SL tablet Place 0.4 mg under the tongue as needed for chest pain.    Marland Kitchen omeprazole (PRILOSEC) 20 MG capsule Take 1 capsule by mouth daily.    . ondansetron (ZOFRAN ODT) 4 MG disintegrating tablet Take 1 tablet (4 mg total) by mouth every 8 (eight) hours as needed for nausea or vomiting. 20 tablet 0  . polyvinyl alcohol (LIQUIFILM TEARS) 1.4 % ophthalmic solution Place 1 drop into both eyes daily.    . QUEtiapine (SEROQUEL) 25 MG tablet Take 1 tablet by mouth daily.    .  rosuvastatin (CRESTOR) 5 MG tablet Take 1 tablet by mouth daily.    . valACYclovir (VALTREX) 1000 MG tablet Take 1,000 mg by mouth as needed (breakouts).     No current facility-administered medications for this visit.     Past Medical History:  Diagnosis Date  . Anxiety   . Arthritis   . Cancer ([place])    breast 2015  . Cardiac pacemaker in situ   . Chest pain   . Diabetes mellitus without complication ([place])   . DM type 2 (diabetes mellitus, type 2) ([place])   . Dyslipidemia   . Dyspnea    with exertion  . Family history of adverse reaction to anesthesia    daughter slow to wake up  . Gallstones   . Headache   . Hypertension   . Macular degeneration   . Pulmonary hypertension ([facility])    PA peak pressure 81 mmHg by 09/2016 echo  . Type II or unspecified type diabetes mellitus without mention of complication, not stated as uncontrolled   . Wears partial dentures     ROS:   All systems reviewed and negative except as noted in the HPI.   Past  Surgical History:  Procedure Laterality Date  . BREAST EXCISIONAL BIOPSY Right 2010  . CATARACT EXTRACTION    . CHOLECYSTECTOMY N/A 01/18/2017   Procedure: LAPAROSCOPIC CHOLECYSTECTOMY;  Surgeon: Ralene Ok, MD;  Location: [facility];  Service: General;  Laterality: N/A;  . IR GENERIC HISTORICAL  10/08/2016   IR PERC CHOLECYSTOSTOMY 10/08/2016 Greggory Keen, MD [facility]-INTER[facility]  . IR RADIOLOGIST EVAL & MGMT  11/16/2016  . KNEE SURGERY Bilateral   . MULTIPLE TOOTH EXTRACTIONS    . PACEMAKER INSERTION     [employer]tr[employer], [facility] 2003  . PACEMAKER INSERTION    . PPM GENERATOR CHANGEOUT N/A 04/16/2019   Procedure: PPM GENERATOR CHANGEOUT;  Surgeon: Evans Lance, MD;  Location: [facility];  Service: Cardiovascular;  Laterality: N/A;  . TOTAL KNEE ARTHROPLASTY Bilateral      Family History  Problem Relation Age of Onset  . Colon cancer Neg Hx      Social History   Socioeconomic History  . Marital status: Widowed    Spouse name:  Not on file  . Number of children: 2  . Years of education: Not on file  . Highest education level: Not on file  Occupational History  . Not on file  Tobacco Use  . Smoking status: Never Smoker  . Smokeless tobacco: Current User    Types: Snuff  Vaping Use  . Vaping Use: Never used  Substance and Sexual Activity  . Alcohol use: No    Alcohol/week: 1.0 standard drink    Types: 1 Cans of beer per week  . Drug use: No  . Sexual activity: Not on file  Other Topics Concern  . Not on file  Social History Narrative   ** Merged History Encounter **       Social Determinants of Health   Financial Resource Strain: Not on file  Food Insecurity: Not on file  Transportation Needs: Not on file  Physical Activity: Not on file  Stress: Not on file  Social Connections: Not on file  Intimate Partner Violence: Not on file     BP 140/66   Pulse 78   Ht 5\' 4"  (1.626 m)   Wt 146 lb 9.6 oz (66.5 kg)   SpO2 97%   BMI 25.16 kg/m   Physical Exam:  frail appearing elderly woman, NAD HEENT: Unremarkable Neck:  No JVD, no thyromegally Lymphatics:  No adenopathy Back:  No CVA tenderness Lungs:  Clear with no wheezes HEART:  Regular rate rhythm, no murmurs, no rubs, no clicks Abd:  soft, positive bowel sounds, no organomegally, no rebound, no guarding Ext:  2 plus pulses, no edema, no cyanosis, no clubbing Skin:  No rashes no nodules Neuro:  CN II through XII intact, motor grossly intact  DEVICE  Normal device function.  See PaceArt for details.   Assess/Plan: 1. CHB - she is s/p PPM. She is asymptomatic, s/p PPM insertion. 2. PPM- her medtronic device is working normally. We will recheck in several months. 3. HTN - her bp is well controlled today. No change in meds.  4. Pulmonary HTN - the etiology is unclear. She is sedentary and her symptoms are class 2.   Mikle Bosworth.D.

## 2020-11-04 NOTE — Patient Instructions (Signed)
Medication Instructions:  Your physician recommends that you continue on your current medications as directed. Please refer to the Current Medication list given to you today.  Labwork: None ordered.  Testing/Procedures: None ordered.  Follow-Up: Your physician wants you to follow-up in: one year with Cristopher Peru, MD or one of the following Advanced Practice Providers on your designated Care Team:    Chanetta Marshall, NP  Tommye Standard, PA-C  Legrand Como "Jonni Sanger" [place], [place]  Remote monitoring is used to monitor your Pacemaker from home. This monitoring reduces the number of office visits required to check your device to one time per year. It allows [place] to keep an eye on the functioning of your device to ensure it is working properly. You are scheduled for a device check from home on 01/13/2021. You may send your transmission at any time that day. If you have a wireless device, the transmission will be sent automatically. After your physician reviews your transmission, you will receive a postcard with your next transmission date.  Any Other Special Instructions Will Be Listed Below (If Applicable).  If you need a refill on your cardiac medications before your next appointment, please call your pharmacy.

## 2020-12-15 DIAGNOSIS — F320 Major depressive disorder, single episode, mild: Secondary | ICD-10-CM | POA: Diagnosis not present

## 2020-12-15 DIAGNOSIS — M159 Polyosteoarthritis, unspecified: Secondary | ICD-10-CM | POA: Diagnosis not present

## 2020-12-15 DIAGNOSIS — E042 Nontoxic multinodular goiter: Secondary | ICD-10-CM | POA: Diagnosis not present

## 2020-12-15 DIAGNOSIS — E1165 Type 2 diabetes mellitus with hyperglycemia: Secondary | ICD-10-CM | POA: Diagnosis not present

## 2020-12-15 DIAGNOSIS — I10 Essential (primary) hypertension: Secondary | ICD-10-CM | POA: Diagnosis not present

## 2020-12-15 DIAGNOSIS — M160 Bilateral primary osteoarthritis of hip: Secondary | ICD-10-CM | POA: Diagnosis not present

## 2020-12-15 DIAGNOSIS — R339 Retention of urine, unspecified: Secondary | ICD-10-CM | POA: Diagnosis not present

## 2020-12-15 DIAGNOSIS — R1319 Other dysphagia: Secondary | ICD-10-CM | POA: Diagnosis not present

## 2020-12-15 DIAGNOSIS — Z9181 History of falling: Secondary | ICD-10-CM | POA: Diagnosis not present

## 2020-12-15 DIAGNOSIS — M150 Primary generalized (osteo)arthritis: Secondary | ICD-10-CM | POA: Insufficient documentation

## 2020-12-15 DIAGNOSIS — R6889 Other general symptoms and signs: Secondary | ICD-10-CM | POA: Diagnosis not present

## 2020-12-15 DIAGNOSIS — R32 Unspecified urinary incontinence: Secondary | ICD-10-CM | POA: Diagnosis not present

## 2020-12-15 DIAGNOSIS — B372 Candidiasis of skin and nail: Secondary | ICD-10-CM | POA: Diagnosis not present

## 2020-12-16 DIAGNOSIS — R3914 Feeling of incomplete bladder emptying: Secondary | ICD-10-CM | POA: Diagnosis not present

## 2020-12-22 ENCOUNTER — Other Ambulatory Visit: Payer: Self-pay | Admitting: Internal Medicine

## 2020-12-22 DIAGNOSIS — E042 Nontoxic multinodular goiter: Secondary | ICD-10-CM

## 2020-12-23 DIAGNOSIS — G4700 Insomnia, unspecified: Secondary | ICD-10-CM | POA: Diagnosis not present

## 2020-12-23 DIAGNOSIS — I8393 Asymptomatic varicose veins of bilateral lower extremities: Secondary | ICD-10-CM | POA: Diagnosis not present

## 2020-12-23 DIAGNOSIS — E1165 Type 2 diabetes mellitus with hyperglycemia: Secondary | ICD-10-CM | POA: Diagnosis not present

## 2020-12-23 DIAGNOSIS — Z7984 Long term (current) use of oral hypoglycemic drugs: Secondary | ICD-10-CM | POA: Diagnosis not present

## 2020-12-23 DIAGNOSIS — I6529 Occlusion and stenosis of unspecified carotid artery: Secondary | ICD-10-CM | POA: Diagnosis not present

## 2020-12-23 DIAGNOSIS — E042 Nontoxic multinodular goiter: Secondary | ICD-10-CM | POA: Diagnosis not present

## 2020-12-23 DIAGNOSIS — R1310 Dysphagia, unspecified: Secondary | ICD-10-CM | POA: Insufficient documentation

## 2020-12-23 DIAGNOSIS — E1142 Type 2 diabetes mellitus with diabetic polyneuropathy: Secondary | ICD-10-CM | POA: Diagnosis not present

## 2020-12-23 DIAGNOSIS — M150 Primary generalized (osteo)arthritis: Secondary | ICD-10-CM | POA: Diagnosis not present

## 2020-12-23 DIAGNOSIS — Z950 Presence of cardiac pacemaker: Secondary | ICD-10-CM | POA: Diagnosis not present

## 2020-12-23 DIAGNOSIS — R32 Unspecified urinary incontinence: Secondary | ICD-10-CM | POA: Diagnosis not present

## 2020-12-23 DIAGNOSIS — R339 Retention of urine, unspecified: Secondary | ICD-10-CM | POA: Diagnosis not present

## 2020-12-23 DIAGNOSIS — I10 Essential (primary) hypertension: Secondary | ICD-10-CM | POA: Diagnosis not present

## 2020-12-23 DIAGNOSIS — E782 Mixed hyperlipidemia: Secondary | ICD-10-CM | POA: Diagnosis not present

## 2020-12-23 DIAGNOSIS — Z9181 History of falling: Secondary | ICD-10-CM | POA: Diagnosis not present

## 2020-12-25 DIAGNOSIS — E1165 Type 2 diabetes mellitus with hyperglycemia: Secondary | ICD-10-CM | POA: Diagnosis not present

## 2020-12-25 DIAGNOSIS — I10 Essential (primary) hypertension: Secondary | ICD-10-CM | POA: Diagnosis not present

## 2020-12-25 DIAGNOSIS — R1310 Dysphagia, unspecified: Secondary | ICD-10-CM | POA: Diagnosis not present

## 2020-12-25 DIAGNOSIS — E1142 Type 2 diabetes mellitus with diabetic polyneuropathy: Secondary | ICD-10-CM | POA: Diagnosis not present

## 2020-12-25 DIAGNOSIS — M150 Primary generalized (osteo)arthritis: Secondary | ICD-10-CM | POA: Diagnosis not present

## 2020-12-25 DIAGNOSIS — G4700 Insomnia, unspecified: Secondary | ICD-10-CM | POA: Diagnosis not present

## 2020-12-26 DIAGNOSIS — I10 Essential (primary) hypertension: Secondary | ICD-10-CM | POA: Diagnosis not present

## 2020-12-26 DIAGNOSIS — E1165 Type 2 diabetes mellitus with hyperglycemia: Secondary | ICD-10-CM | POA: Diagnosis not present

## 2020-12-26 DIAGNOSIS — G4700 Insomnia, unspecified: Secondary | ICD-10-CM | POA: Diagnosis not present

## 2020-12-26 DIAGNOSIS — M150 Primary generalized (osteo)arthritis: Secondary | ICD-10-CM | POA: Diagnosis not present

## 2020-12-26 DIAGNOSIS — R1310 Dysphagia, unspecified: Secondary | ICD-10-CM | POA: Diagnosis not present

## 2020-12-26 DIAGNOSIS — E1142 Type 2 diabetes mellitus with diabetic polyneuropathy: Secondary | ICD-10-CM | POA: Diagnosis not present

## 2020-12-30 DIAGNOSIS — M150 Primary generalized (osteo)arthritis: Secondary | ICD-10-CM | POA: Diagnosis not present

## 2020-12-30 DIAGNOSIS — E1142 Type 2 diabetes mellitus with diabetic polyneuropathy: Secondary | ICD-10-CM | POA: Diagnosis not present

## 2020-12-30 DIAGNOSIS — I10 Essential (primary) hypertension: Secondary | ICD-10-CM | POA: Diagnosis not present

## 2020-12-30 DIAGNOSIS — E1165 Type 2 diabetes mellitus with hyperglycemia: Secondary | ICD-10-CM | POA: Diagnosis not present

## 2020-12-30 DIAGNOSIS — R1310 Dysphagia, unspecified: Secondary | ICD-10-CM | POA: Diagnosis not present

## 2020-12-30 DIAGNOSIS — G4700 Insomnia, unspecified: Secondary | ICD-10-CM | POA: Diagnosis not present

## 2020-12-31 DIAGNOSIS — G4700 Insomnia, unspecified: Secondary | ICD-10-CM | POA: Diagnosis not present

## 2020-12-31 DIAGNOSIS — R1310 Dysphagia, unspecified: Secondary | ICD-10-CM | POA: Diagnosis not present

## 2020-12-31 DIAGNOSIS — E1142 Type 2 diabetes mellitus with diabetic polyneuropathy: Secondary | ICD-10-CM | POA: Diagnosis not present

## 2020-12-31 DIAGNOSIS — E1165 Type 2 diabetes mellitus with hyperglycemia: Secondary | ICD-10-CM | POA: Diagnosis not present

## 2020-12-31 DIAGNOSIS — M150 Primary generalized (osteo)arthritis: Secondary | ICD-10-CM | POA: Diagnosis not present

## 2020-12-31 DIAGNOSIS — I10 Essential (primary) hypertension: Secondary | ICD-10-CM | POA: Diagnosis not present

## 2021-01-01 DIAGNOSIS — I10 Essential (primary) hypertension: Secondary | ICD-10-CM | POA: Diagnosis not present

## 2021-01-01 DIAGNOSIS — E1165 Type 2 diabetes mellitus with hyperglycemia: Secondary | ICD-10-CM | POA: Diagnosis not present

## 2021-01-01 DIAGNOSIS — G4700 Insomnia, unspecified: Secondary | ICD-10-CM | POA: Diagnosis not present

## 2021-01-01 DIAGNOSIS — R1310 Dysphagia, unspecified: Secondary | ICD-10-CM | POA: Diagnosis not present

## 2021-01-01 DIAGNOSIS — E1142 Type 2 diabetes mellitus with diabetic polyneuropathy: Secondary | ICD-10-CM | POA: Diagnosis not present

## 2021-01-01 DIAGNOSIS — M150 Primary generalized (osteo)arthritis: Secondary | ICD-10-CM | POA: Diagnosis not present

## 2021-01-01 DIAGNOSIS — Z9181 History of falling: Secondary | ICD-10-CM | POA: Diagnosis not present

## 2021-01-05 DIAGNOSIS — I10 Essential (primary) hypertension: Secondary | ICD-10-CM | POA: Diagnosis not present

## 2021-01-05 DIAGNOSIS — R1310 Dysphagia, unspecified: Secondary | ICD-10-CM | POA: Diagnosis not present

## 2021-01-05 DIAGNOSIS — K222 Esophageal obstruction: Secondary | ICD-10-CM | POA: Diagnosis not present

## 2021-01-05 DIAGNOSIS — E119 Type 2 diabetes mellitus without complications: Secondary | ICD-10-CM | POA: Diagnosis not present

## 2021-01-06 ENCOUNTER — Ambulatory Visit
Admission: RE | Admit: 2021-01-06 | Discharge: 2021-01-06 | Disposition: A | Discharge status: Home / Self Care | Payer: Medicare Other | Source: Ambulatory Visit | Attending: Internal Medicine | Admitting: Internal Medicine

## 2021-01-06 DIAGNOSIS — E041 Nontoxic single thyroid nodule: Secondary | ICD-10-CM | POA: Diagnosis not present

## 2021-01-06 DIAGNOSIS — E042 Nontoxic multinodular goiter: Secondary | ICD-10-CM

## 2021-01-07 ENCOUNTER — Telehealth: Payer: Self-pay | Admitting: *Deleted

## 2021-01-07 DIAGNOSIS — R1310 Dysphagia, unspecified: Secondary | ICD-10-CM | POA: Diagnosis not present

## 2021-01-07 DIAGNOSIS — G4700 Insomnia, unspecified: Secondary | ICD-10-CM | POA: Diagnosis not present

## 2021-01-07 DIAGNOSIS — I10 Essential (primary) hypertension: Secondary | ICD-10-CM | POA: Diagnosis not present

## 2021-01-07 DIAGNOSIS — M150 Primary generalized (osteo)arthritis: Secondary | ICD-10-CM | POA: Diagnosis not present

## 2021-01-07 DIAGNOSIS — E1142 Type 2 diabetes mellitus with diabetic polyneuropathy: Secondary | ICD-10-CM | POA: Diagnosis not present

## 2021-01-07 DIAGNOSIS — E1165 Type 2 diabetes mellitus with hyperglycemia: Secondary | ICD-10-CM | POA: Diagnosis not present

## 2021-01-07 NOTE — Telephone Encounter (Signed)
   Name: Maria [name]  DOB: 09-05-1924  MRN: [id number]   Primary Cardiologist: Cristopher Peru, MD  Chart reviewed as part of pre-operative protocol coverage. Patient was contacted 01/07/2021 in reference to pre-operative risk assessment for pending surgery as outlined below.  Maria [name] was last seen on 11/04/2020 by Dr. Lovena Le.  Since that day, Maria [name] has done well without chest discomfort or worsening dyspnea.  According to her daughter, they help her with bathing, otherwise she is fairly independent despite her advanced age.  Her major risk has to do with her advanced age and history of pulm hypertension.  We will also forward to Dr. Lovena Le to notify attending.  Therefore, based on ACC/AHA guidelines, the patient would be at acceptable risk for the planned procedure without further cardiovascular testing.   The patient was advised that if she develops new symptoms prior to surgery to contact our office to arrange for a follow-up visit, and she verbalized understanding.  I will route this recommendation to the requesting party via Epic fax function and remove from pre-op pool. Please call with questions.  [place], [place] 01/07/2021, 7:01 PM

## 2021-01-07 NOTE — Telephone Encounter (Signed)
   [facility] Pre-operative Risk Assessment    Patient Name: Maria [name]  DOB: 12-19-1923  MRN: [id number]   HEARTCARE STAFF: - Please ensure there is not already an duplicate clearance open for this procedure. - Under Visit Info/Reason for Call, type in Other and utilize the format Clearance MM/DD/YY or Clearance TBD. Do not use dashes or single digits. - If request is for dental extraction, please clarify the # of teeth to be extracted.  Request for surgical clearance:  1. What type of surgery is being performed? EGD   2. When is this surgery scheduled? 01/13/2021   3. What type of clearance is required (medical clearance vs. Pharmacy clearance to hold med vs. Both)? Medical  4. Are there any medications that need to be held prior to surgery and how long?None listed, pt is on Aspirin 81  5. Practice name and name of physician performing surgery? [facility], Dr Benson Norway   6. What is the office phone number? [phone]    7.   What is the office fax number? [phone]  8.   Anesthesia type (None, local, MAC, general) ? Propofol   Maria [name] 01/07/2021, 3:10 PM  _________________________________________________________________   (provider comments below)

## 2021-01-08 DIAGNOSIS — E1165 Type 2 diabetes mellitus with hyperglycemia: Secondary | ICD-10-CM | POA: Diagnosis not present

## 2021-01-08 DIAGNOSIS — G4700 Insomnia, unspecified: Secondary | ICD-10-CM | POA: Diagnosis not present

## 2021-01-08 DIAGNOSIS — M150 Primary generalized (osteo)arthritis: Secondary | ICD-10-CM | POA: Diagnosis not present

## 2021-01-08 DIAGNOSIS — E1142 Type 2 diabetes mellitus with diabetic polyneuropathy: Secondary | ICD-10-CM | POA: Diagnosis not present

## 2021-01-08 DIAGNOSIS — I10 Essential (primary) hypertension: Secondary | ICD-10-CM | POA: Diagnosis not present

## 2021-01-08 DIAGNOSIS — R1310 Dysphagia, unspecified: Secondary | ICD-10-CM | POA: Diagnosis not present

## 2021-01-09 ENCOUNTER — Other Ambulatory Visit: Payer: Self-pay | Admitting: Gastroenterology

## 2021-01-09 DIAGNOSIS — R1310 Dysphagia, unspecified: Secondary | ICD-10-CM | POA: Diagnosis not present

## 2021-01-09 DIAGNOSIS — E1165 Type 2 diabetes mellitus with hyperglycemia: Secondary | ICD-10-CM | POA: Diagnosis not present

## 2021-01-09 DIAGNOSIS — E1142 Type 2 diabetes mellitus with diabetic polyneuropathy: Secondary | ICD-10-CM | POA: Diagnosis not present

## 2021-01-09 DIAGNOSIS — I10 Essential (primary) hypertension: Secondary | ICD-10-CM | POA: Diagnosis not present

## 2021-01-09 DIAGNOSIS — G4700 Insomnia, unspecified: Secondary | ICD-10-CM | POA: Diagnosis not present

## 2021-01-09 DIAGNOSIS — E042 Nontoxic multinodular goiter: Secondary | ICD-10-CM | POA: Diagnosis not present

## 2021-01-09 DIAGNOSIS — M150 Primary generalized (osteo)arthritis: Secondary | ICD-10-CM | POA: Diagnosis not present

## 2021-01-09 DIAGNOSIS — G4710 Hypersomnia, unspecified: Secondary | ICD-10-CM | POA: Diagnosis not present

## 2021-01-12 ENCOUNTER — Encounter (HOSPITAL_COMMUNITY): Payer: Self-pay | Admitting: Gastroenterology

## 2021-01-12 DIAGNOSIS — R1310 Dysphagia, unspecified: Secondary | ICD-10-CM | POA: Diagnosis not present

## 2021-01-12 DIAGNOSIS — E1165 Type 2 diabetes mellitus with hyperglycemia: Secondary | ICD-10-CM | POA: Diagnosis not present

## 2021-01-12 DIAGNOSIS — E1142 Type 2 diabetes mellitus with diabetic polyneuropathy: Secondary | ICD-10-CM | POA: Diagnosis not present

## 2021-01-12 DIAGNOSIS — G4700 Insomnia, unspecified: Secondary | ICD-10-CM | POA: Diagnosis not present

## 2021-01-12 DIAGNOSIS — M150 Primary generalized (osteo)arthritis: Secondary | ICD-10-CM | POA: Diagnosis not present

## 2021-01-12 DIAGNOSIS — I10 Essential (primary) hypertension: Secondary | ICD-10-CM | POA: Diagnosis not present

## 2021-01-13 ENCOUNTER — Ambulatory Visit (INDEPENDENT_AMBULATORY_CARE_PROVIDER_SITE_OTHER): Payer: Medicare Other

## 2021-01-13 DIAGNOSIS — I442 Atrioventricular block, complete: Secondary | ICD-10-CM | POA: Diagnosis not present

## 2021-01-13 LAB — CUP PACEART REMOTE DEVICE CHECK
Battery Remaining Longevity: 116 mo
Battery Voltage: 3.02 V
Brady Statistic AP VP Percent: 8.77 %
Brady Statistic AP VS Percent: 0 %
Brady Statistic AS VP Percent: 88.3 %
Brady Statistic AS VS Percent: 2.93 %
Brady Statistic RA Percent Paced: 9.63 %
Brady Statistic RV Percent Paced: 97.07 %
Date Time Interrogation Session: 20220503053857
Implantable Lead Implant Date: 20030311
Implantable Lead Implant Date: 20030311
Implantable Lead Location: 753859
Implantable Lead Location: 753860
Implantable Lead Model: 4092
Implantable Lead Model: 4592
Implantable Pulse Generator Implant Date: 20200803
Lead Channel Impedance Value: 399 Ohm
Lead Channel Impedance Value: 456 Ohm
Lead Channel Impedance Value: 532 Ohm
Lead Channel Impedance Value: 551 Ohm
Lead Channel Pacing Threshold Amplitude: 0.375 V
Lead Channel Pacing Threshold Amplitude: 0.625 V
Lead Channel Pacing Threshold Pulse Width: 0.4 ms
Lead Channel Pacing Threshold Pulse Width: 0.4 ms
Lead Channel Sensing Intrinsic Amplitude: 10.75 mV
Lead Channel Sensing Intrinsic Amplitude: 10.75 mV
Lead Channel Sensing Intrinsic Amplitude: 2.875 mV
Lead Channel Sensing Intrinsic Amplitude: 2.875 mV
Lead Channel Setting Pacing Amplitude: 1.5 V
Lead Channel Setting Pacing Amplitude: 2.5 V
Lead Channel Setting Pacing Pulse Width: 0.4 ms
Lead Channel Setting Sensing Sensitivity: 4 mV

## 2021-01-16 ENCOUNTER — Ambulatory Visit (HOSPITAL_COMMUNITY): Admission: RE | Admit: 2021-01-16 | Payer: Medicare Other | Source: Home / Self Care | Admitting: Gastroenterology

## 2021-01-16 ENCOUNTER — Encounter (HOSPITAL_COMMUNITY): Admission: RE | Payer: Self-pay | Source: Home / Self Care

## 2021-01-16 DIAGNOSIS — E1165 Type 2 diabetes mellitus with hyperglycemia: Secondary | ICD-10-CM | POA: Diagnosis not present

## 2021-01-16 DIAGNOSIS — M150 Primary generalized (osteo)arthritis: Secondary | ICD-10-CM | POA: Diagnosis not present

## 2021-01-16 DIAGNOSIS — R1310 Dysphagia, unspecified: Secondary | ICD-10-CM | POA: Diagnosis not present

## 2021-01-16 DIAGNOSIS — I10 Essential (primary) hypertension: Secondary | ICD-10-CM | POA: Diagnosis not present

## 2021-01-16 DIAGNOSIS — E1142 Type 2 diabetes mellitus with diabetic polyneuropathy: Secondary | ICD-10-CM | POA: Diagnosis not present

## 2021-01-16 DIAGNOSIS — G4700 Insomnia, unspecified: Secondary | ICD-10-CM | POA: Diagnosis not present

## 2021-01-16 SURGERY — ESOPHAGOGASTRODUODENOSCOPY (EGD) WITH PROPOFOL
Anesthesia: Monitor Anesthesia Care

## 2021-01-20 DIAGNOSIS — E1142 Type 2 diabetes mellitus with diabetic polyneuropathy: Secondary | ICD-10-CM | POA: Diagnosis not present

## 2021-01-20 DIAGNOSIS — I10 Essential (primary) hypertension: Secondary | ICD-10-CM | POA: Diagnosis not present

## 2021-01-20 DIAGNOSIS — E1165 Type 2 diabetes mellitus with hyperglycemia: Secondary | ICD-10-CM | POA: Diagnosis not present

## 2021-01-20 DIAGNOSIS — R1310 Dysphagia, unspecified: Secondary | ICD-10-CM | POA: Diagnosis not present

## 2021-01-20 DIAGNOSIS — G4700 Insomnia, unspecified: Secondary | ICD-10-CM | POA: Diagnosis not present

## 2021-01-20 DIAGNOSIS — M150 Primary generalized (osteo)arthritis: Secondary | ICD-10-CM | POA: Diagnosis not present

## 2021-01-21 DIAGNOSIS — R1310 Dysphagia, unspecified: Secondary | ICD-10-CM | POA: Diagnosis not present

## 2021-01-21 DIAGNOSIS — E1142 Type 2 diabetes mellitus with diabetic polyneuropathy: Secondary | ICD-10-CM | POA: Diagnosis not present

## 2021-01-21 DIAGNOSIS — G4700 Insomnia, unspecified: Secondary | ICD-10-CM | POA: Diagnosis not present

## 2021-01-21 DIAGNOSIS — I10 Essential (primary) hypertension: Secondary | ICD-10-CM | POA: Diagnosis not present

## 2021-01-21 DIAGNOSIS — E1165 Type 2 diabetes mellitus with hyperglycemia: Secondary | ICD-10-CM | POA: Diagnosis not present

## 2021-01-21 DIAGNOSIS — M150 Primary generalized (osteo)arthritis: Secondary | ICD-10-CM | POA: Diagnosis not present

## 2021-01-22 DIAGNOSIS — M150 Primary generalized (osteo)arthritis: Secondary | ICD-10-CM | POA: Diagnosis not present

## 2021-01-22 DIAGNOSIS — I8393 Asymptomatic varicose veins of bilateral lower extremities: Secondary | ICD-10-CM | POA: Diagnosis not present

## 2021-01-22 DIAGNOSIS — G4700 Insomnia, unspecified: Secondary | ICD-10-CM | POA: Diagnosis not present

## 2021-01-22 DIAGNOSIS — E782 Mixed hyperlipidemia: Secondary | ICD-10-CM | POA: Diagnosis not present

## 2021-01-22 DIAGNOSIS — R32 Unspecified urinary incontinence: Secondary | ICD-10-CM | POA: Diagnosis not present

## 2021-01-22 DIAGNOSIS — Z9181 History of falling: Secondary | ICD-10-CM | POA: Diagnosis not present

## 2021-01-22 DIAGNOSIS — I10 Essential (primary) hypertension: Secondary | ICD-10-CM | POA: Diagnosis not present

## 2021-01-22 DIAGNOSIS — Z950 Presence of cardiac pacemaker: Secondary | ICD-10-CM | POA: Diagnosis not present

## 2021-01-22 DIAGNOSIS — I6529 Occlusion and stenosis of unspecified carotid artery: Secondary | ICD-10-CM | POA: Diagnosis not present

## 2021-01-22 DIAGNOSIS — R1310 Dysphagia, unspecified: Secondary | ICD-10-CM | POA: Diagnosis not present

## 2021-01-22 DIAGNOSIS — R339 Retention of urine, unspecified: Secondary | ICD-10-CM | POA: Diagnosis not present

## 2021-01-22 DIAGNOSIS — E1142 Type 2 diabetes mellitus with diabetic polyneuropathy: Secondary | ICD-10-CM | POA: Diagnosis not present

## 2021-01-22 DIAGNOSIS — Z7984 Long term (current) use of oral hypoglycemic drugs: Secondary | ICD-10-CM | POA: Diagnosis not present

## 2021-01-22 DIAGNOSIS — E1165 Type 2 diabetes mellitus with hyperglycemia: Secondary | ICD-10-CM | POA: Diagnosis not present

## 2021-01-22 DIAGNOSIS — E042 Nontoxic multinodular goiter: Secondary | ICD-10-CM | POA: Diagnosis not present

## 2021-01-26 DIAGNOSIS — R1310 Dysphagia, unspecified: Secondary | ICD-10-CM | POA: Diagnosis not present

## 2021-01-26 DIAGNOSIS — I10 Essential (primary) hypertension: Secondary | ICD-10-CM | POA: Diagnosis not present

## 2021-01-26 DIAGNOSIS — E1165 Type 2 diabetes mellitus with hyperglycemia: Secondary | ICD-10-CM | POA: Diagnosis not present

## 2021-01-26 DIAGNOSIS — E1142 Type 2 diabetes mellitus with diabetic polyneuropathy: Secondary | ICD-10-CM | POA: Diagnosis not present

## 2021-01-26 DIAGNOSIS — M150 Primary generalized (osteo)arthritis: Secondary | ICD-10-CM | POA: Diagnosis not present

## 2021-01-26 DIAGNOSIS — G4700 Insomnia, unspecified: Secondary | ICD-10-CM | POA: Diagnosis not present

## 2021-01-28 DIAGNOSIS — R1310 Dysphagia, unspecified: Secondary | ICD-10-CM | POA: Diagnosis not present

## 2021-01-28 DIAGNOSIS — M150 Primary generalized (osteo)arthritis: Secondary | ICD-10-CM | POA: Diagnosis not present

## 2021-01-28 DIAGNOSIS — I10 Essential (primary) hypertension: Secondary | ICD-10-CM | POA: Diagnosis not present

## 2021-01-28 DIAGNOSIS — G4700 Insomnia, unspecified: Secondary | ICD-10-CM | POA: Diagnosis not present

## 2021-01-28 DIAGNOSIS — E1142 Type 2 diabetes mellitus with diabetic polyneuropathy: Secondary | ICD-10-CM | POA: Diagnosis not present

## 2021-01-28 DIAGNOSIS — E1165 Type 2 diabetes mellitus with hyperglycemia: Secondary | ICD-10-CM | POA: Diagnosis not present

## 2021-02-02 DIAGNOSIS — G4700 Insomnia, unspecified: Secondary | ICD-10-CM | POA: Diagnosis not present

## 2021-02-02 DIAGNOSIS — M150 Primary generalized (osteo)arthritis: Secondary | ICD-10-CM | POA: Diagnosis not present

## 2021-02-02 DIAGNOSIS — E1142 Type 2 diabetes mellitus with diabetic polyneuropathy: Secondary | ICD-10-CM | POA: Diagnosis not present

## 2021-02-02 DIAGNOSIS — I10 Essential (primary) hypertension: Secondary | ICD-10-CM | POA: Diagnosis not present

## 2021-02-02 DIAGNOSIS — R1310 Dysphagia, unspecified: Secondary | ICD-10-CM | POA: Diagnosis not present

## 2021-02-02 DIAGNOSIS — E1165 Type 2 diabetes mellitus with hyperglycemia: Secondary | ICD-10-CM | POA: Diagnosis not present

## 2021-02-04 DIAGNOSIS — R1310 Dysphagia, unspecified: Secondary | ICD-10-CM | POA: Diagnosis not present

## 2021-02-04 DIAGNOSIS — G4700 Insomnia, unspecified: Secondary | ICD-10-CM | POA: Diagnosis not present

## 2021-02-04 DIAGNOSIS — E1142 Type 2 diabetes mellitus with diabetic polyneuropathy: Secondary | ICD-10-CM | POA: Diagnosis not present

## 2021-02-04 DIAGNOSIS — E1165 Type 2 diabetes mellitus with hyperglycemia: Secondary | ICD-10-CM | POA: Diagnosis not present

## 2021-02-04 DIAGNOSIS — I10 Essential (primary) hypertension: Secondary | ICD-10-CM | POA: Diagnosis not present

## 2021-02-04 DIAGNOSIS — M150 Primary generalized (osteo)arthritis: Secondary | ICD-10-CM | POA: Diagnosis not present

## 2021-02-04 NOTE — Progress Notes (Signed)
Remote pacemaker transmission.   

## 2021-02-10 DIAGNOSIS — E1165 Type 2 diabetes mellitus with hyperglycemia: Secondary | ICD-10-CM | POA: Diagnosis not present

## 2021-02-10 DIAGNOSIS — E1142 Type 2 diabetes mellitus with diabetic polyneuropathy: Secondary | ICD-10-CM | POA: Diagnosis not present

## 2021-02-10 DIAGNOSIS — G4700 Insomnia, unspecified: Secondary | ICD-10-CM | POA: Diagnosis not present

## 2021-02-10 DIAGNOSIS — I10 Essential (primary) hypertension: Secondary | ICD-10-CM | POA: Diagnosis not present

## 2021-02-10 DIAGNOSIS — R1310 Dysphagia, unspecified: Secondary | ICD-10-CM | POA: Diagnosis not present

## 2021-02-10 DIAGNOSIS — M150 Primary generalized (osteo)arthritis: Secondary | ICD-10-CM | POA: Diagnosis not present

## 2021-02-11 DIAGNOSIS — I10 Essential (primary) hypertension: Secondary | ICD-10-CM | POA: Diagnosis not present

## 2021-02-11 DIAGNOSIS — E1165 Type 2 diabetes mellitus with hyperglycemia: Secondary | ICD-10-CM | POA: Diagnosis not present

## 2021-02-11 DIAGNOSIS — M150 Primary generalized (osteo)arthritis: Secondary | ICD-10-CM | POA: Diagnosis not present

## 2021-02-11 DIAGNOSIS — E1142 Type 2 diabetes mellitus with diabetic polyneuropathy: Secondary | ICD-10-CM | POA: Diagnosis not present

## 2021-02-11 DIAGNOSIS — R1310 Dysphagia, unspecified: Secondary | ICD-10-CM | POA: Diagnosis not present

## 2021-02-11 DIAGNOSIS — G4700 Insomnia, unspecified: Secondary | ICD-10-CM | POA: Diagnosis not present

## 2021-02-17 DIAGNOSIS — R338 Other retention of urine: Secondary | ICD-10-CM | POA: Diagnosis not present

## 2021-02-18 DIAGNOSIS — E1165 Type 2 diabetes mellitus with hyperglycemia: Secondary | ICD-10-CM | POA: Diagnosis not present

## 2021-02-18 DIAGNOSIS — R1310 Dysphagia, unspecified: Secondary | ICD-10-CM | POA: Diagnosis not present

## 2021-02-18 DIAGNOSIS — E1142 Type 2 diabetes mellitus with diabetic polyneuropathy: Secondary | ICD-10-CM | POA: Diagnosis not present

## 2021-02-18 DIAGNOSIS — G4700 Insomnia, unspecified: Secondary | ICD-10-CM | POA: Diagnosis not present

## 2021-02-18 DIAGNOSIS — I10 Essential (primary) hypertension: Secondary | ICD-10-CM | POA: Diagnosis not present

## 2021-02-18 DIAGNOSIS — M150 Primary generalized (osteo)arthritis: Secondary | ICD-10-CM | POA: Diagnosis not present

## 2021-02-21 DIAGNOSIS — M150 Primary generalized (osteo)arthritis: Secondary | ICD-10-CM | POA: Diagnosis not present

## 2021-02-21 DIAGNOSIS — G4700 Insomnia, unspecified: Secondary | ICD-10-CM | POA: Diagnosis not present

## 2021-02-21 DIAGNOSIS — Z9181 History of falling: Secondary | ICD-10-CM | POA: Diagnosis not present

## 2021-02-21 DIAGNOSIS — Z950 Presence of cardiac pacemaker: Secondary | ICD-10-CM | POA: Diagnosis not present

## 2021-02-21 DIAGNOSIS — E1165 Type 2 diabetes mellitus with hyperglycemia: Secondary | ICD-10-CM | POA: Diagnosis not present

## 2021-02-21 DIAGNOSIS — I10 Essential (primary) hypertension: Secondary | ICD-10-CM | POA: Diagnosis not present

## 2021-02-21 DIAGNOSIS — E782 Mixed hyperlipidemia: Secondary | ICD-10-CM | POA: Diagnosis not present

## 2021-02-21 DIAGNOSIS — E1142 Type 2 diabetes mellitus with diabetic polyneuropathy: Secondary | ICD-10-CM | POA: Diagnosis not present

## 2021-02-21 DIAGNOSIS — I6529 Occlusion and stenosis of unspecified carotid artery: Secondary | ICD-10-CM | POA: Diagnosis not present

## 2021-02-21 DIAGNOSIS — R1310 Dysphagia, unspecified: Secondary | ICD-10-CM | POA: Diagnosis not present

## 2021-02-21 DIAGNOSIS — I8393 Asymptomatic varicose veins of bilateral lower extremities: Secondary | ICD-10-CM | POA: Diagnosis not present

## 2021-02-21 DIAGNOSIS — R339 Retention of urine, unspecified: Secondary | ICD-10-CM | POA: Diagnosis not present

## 2021-02-21 DIAGNOSIS — E042 Nontoxic multinodular goiter: Secondary | ICD-10-CM | POA: Diagnosis not present

## 2021-02-21 DIAGNOSIS — Z7984 Long term (current) use of oral hypoglycemic drugs: Secondary | ICD-10-CM | POA: Diagnosis not present

## 2021-02-21 DIAGNOSIS — R32 Unspecified urinary incontinence: Secondary | ICD-10-CM | POA: Diagnosis not present

## 2021-02-25 DIAGNOSIS — E1142 Type 2 diabetes mellitus with diabetic polyneuropathy: Secondary | ICD-10-CM | POA: Diagnosis not present

## 2021-02-25 DIAGNOSIS — E1165 Type 2 diabetes mellitus with hyperglycemia: Secondary | ICD-10-CM | POA: Diagnosis not present

## 2021-02-25 DIAGNOSIS — M150 Primary generalized (osteo)arthritis: Secondary | ICD-10-CM | POA: Diagnosis not present

## 2021-02-25 DIAGNOSIS — I10 Essential (primary) hypertension: Secondary | ICD-10-CM | POA: Diagnosis not present

## 2021-02-25 DIAGNOSIS — G4700 Insomnia, unspecified: Secondary | ICD-10-CM | POA: Diagnosis not present

## 2021-02-25 DIAGNOSIS — R1310 Dysphagia, unspecified: Secondary | ICD-10-CM | POA: Diagnosis not present

## 2021-03-02 DIAGNOSIS — Z9181 History of falling: Secondary | ICD-10-CM | POA: Diagnosis not present

## 2021-03-02 DIAGNOSIS — M150 Primary generalized (osteo)arthritis: Secondary | ICD-10-CM | POA: Diagnosis not present

## 2021-03-02 DIAGNOSIS — R1310 Dysphagia, unspecified: Secondary | ICD-10-CM | POA: Diagnosis not present

## 2021-03-02 DIAGNOSIS — E1165 Type 2 diabetes mellitus with hyperglycemia: Secondary | ICD-10-CM | POA: Diagnosis not present

## 2021-03-02 DIAGNOSIS — I10 Essential (primary) hypertension: Secondary | ICD-10-CM | POA: Diagnosis not present

## 2021-03-02 DIAGNOSIS — G4700 Insomnia, unspecified: Secondary | ICD-10-CM | POA: Diagnosis not present

## 2021-03-02 DIAGNOSIS — E1142 Type 2 diabetes mellitus with diabetic polyneuropathy: Secondary | ICD-10-CM | POA: Diagnosis not present

## 2021-03-05 DIAGNOSIS — E1142 Type 2 diabetes mellitus with diabetic polyneuropathy: Secondary | ICD-10-CM | POA: Diagnosis not present

## 2021-03-05 DIAGNOSIS — G4700 Insomnia, unspecified: Secondary | ICD-10-CM | POA: Diagnosis not present

## 2021-03-05 DIAGNOSIS — R1310 Dysphagia, unspecified: Secondary | ICD-10-CM | POA: Diagnosis not present

## 2021-03-05 DIAGNOSIS — E1165 Type 2 diabetes mellitus with hyperglycemia: Secondary | ICD-10-CM | POA: Diagnosis not present

## 2021-03-05 DIAGNOSIS — I10 Essential (primary) hypertension: Secondary | ICD-10-CM | POA: Diagnosis not present

## 2021-03-05 DIAGNOSIS — M150 Primary generalized (osteo)arthritis: Secondary | ICD-10-CM | POA: Diagnosis not present

## 2021-03-09 DIAGNOSIS — R3914 Feeling of incomplete bladder emptying: Secondary | ICD-10-CM | POA: Diagnosis not present

## 2021-03-10 DIAGNOSIS — E1142 Type 2 diabetes mellitus with diabetic polyneuropathy: Secondary | ICD-10-CM | POA: Diagnosis not present

## 2021-03-10 DIAGNOSIS — Z1231 Encounter for screening mammogram for malignant neoplasm of breast: Secondary | ICD-10-CM | POA: Diagnosis not present

## 2021-03-10 DIAGNOSIS — E782 Mixed hyperlipidemia: Secondary | ICD-10-CM | POA: Diagnosis not present

## 2021-03-10 DIAGNOSIS — Z1211 Encounter for screening for malignant neoplasm of colon: Secondary | ICD-10-CM | POA: Diagnosis not present

## 2021-03-10 DIAGNOSIS — I10 Essential (primary) hypertension: Secondary | ICD-10-CM | POA: Diagnosis not present

## 2021-03-10 DIAGNOSIS — R7989 Other specified abnormal findings of blood chemistry: Secondary | ICD-10-CM | POA: Diagnosis not present

## 2021-03-10 DIAGNOSIS — Z13820 Encounter for screening for osteoporosis: Secondary | ICD-10-CM | POA: Diagnosis not present

## 2021-03-10 DIAGNOSIS — I739 Peripheral vascular disease, unspecified: Secondary | ICD-10-CM | POA: Diagnosis not present

## 2021-03-10 DIAGNOSIS — R079 Chest pain, unspecified: Secondary | ICD-10-CM | POA: Diagnosis not present

## 2021-03-10 DIAGNOSIS — E1165 Type 2 diabetes mellitus with hyperglycemia: Secondary | ICD-10-CM | POA: Diagnosis not present

## 2021-03-10 DIAGNOSIS — M160 Bilateral primary osteoarthritis of hip: Secondary | ICD-10-CM | POA: Diagnosis not present

## 2021-03-10 DIAGNOSIS — H6123 Impacted cerumen, bilateral: Secondary | ICD-10-CM | POA: Diagnosis not present

## 2021-03-10 DIAGNOSIS — Z0001 Encounter for general adult medical examination with abnormal findings: Secondary | ICD-10-CM | POA: Diagnosis not present

## 2021-03-10 DIAGNOSIS — Z23 Encounter for immunization: Secondary | ICD-10-CM | POA: Diagnosis not present

## 2021-03-11 DIAGNOSIS — Z9181 History of falling: Secondary | ICD-10-CM | POA: Insufficient documentation

## 2021-03-11 DIAGNOSIS — I272 Pulmonary hypertension, unspecified: Secondary | ICD-10-CM | POA: Insufficient documentation

## 2021-03-11 DIAGNOSIS — H9193 Unspecified hearing loss, bilateral: Secondary | ICD-10-CM | POA: Insufficient documentation

## 2021-03-11 DIAGNOSIS — F03918 Unspecified dementia, unspecified severity, with other behavioral disturbance: Secondary | ICD-10-CM | POA: Insufficient documentation

## 2021-03-11 DIAGNOSIS — F32A Depression, unspecified: Secondary | ICD-10-CM | POA: Insufficient documentation

## 2021-03-12 DIAGNOSIS — M150 Primary generalized (osteo)arthritis: Secondary | ICD-10-CM | POA: Diagnosis not present

## 2021-03-12 DIAGNOSIS — E1165 Type 2 diabetes mellitus with hyperglycemia: Secondary | ICD-10-CM | POA: Diagnosis not present

## 2021-03-12 DIAGNOSIS — R1310 Dysphagia, unspecified: Secondary | ICD-10-CM | POA: Diagnosis not present

## 2021-03-12 DIAGNOSIS — G4700 Insomnia, unspecified: Secondary | ICD-10-CM | POA: Diagnosis not present

## 2021-03-12 DIAGNOSIS — E1142 Type 2 diabetes mellitus with diabetic polyneuropathy: Secondary | ICD-10-CM | POA: Diagnosis not present

## 2021-03-12 DIAGNOSIS — I10 Essential (primary) hypertension: Secondary | ICD-10-CM | POA: Diagnosis not present

## 2021-03-13 DIAGNOSIS — I10 Essential (primary) hypertension: Secondary | ICD-10-CM | POA: Diagnosis not present

## 2021-03-13 DIAGNOSIS — E1165 Type 2 diabetes mellitus with hyperglycemia: Secondary | ICD-10-CM | POA: Diagnosis not present

## 2021-03-13 DIAGNOSIS — M150 Primary generalized (osteo)arthritis: Secondary | ICD-10-CM | POA: Diagnosis not present

## 2021-03-13 DIAGNOSIS — G4700 Insomnia, unspecified: Secondary | ICD-10-CM | POA: Diagnosis not present

## 2021-03-13 DIAGNOSIS — E1142 Type 2 diabetes mellitus with diabetic polyneuropathy: Secondary | ICD-10-CM | POA: Diagnosis not present

## 2021-03-13 DIAGNOSIS — R1310 Dysphagia, unspecified: Secondary | ICD-10-CM | POA: Diagnosis not present

## 2021-03-17 DIAGNOSIS — R3914 Feeling of incomplete bladder emptying: Secondary | ICD-10-CM | POA: Diagnosis not present

## 2021-03-18 DIAGNOSIS — G4700 Insomnia, unspecified: Secondary | ICD-10-CM | POA: Diagnosis not present

## 2021-03-18 DIAGNOSIS — I10 Essential (primary) hypertension: Secondary | ICD-10-CM | POA: Diagnosis not present

## 2021-03-18 DIAGNOSIS — M150 Primary generalized (osteo)arthritis: Secondary | ICD-10-CM | POA: Diagnosis not present

## 2021-03-18 DIAGNOSIS — R1310 Dysphagia, unspecified: Secondary | ICD-10-CM | POA: Diagnosis not present

## 2021-03-18 DIAGNOSIS — E1142 Type 2 diabetes mellitus with diabetic polyneuropathy: Secondary | ICD-10-CM | POA: Diagnosis not present

## 2021-03-18 DIAGNOSIS — E1165 Type 2 diabetes mellitus with hyperglycemia: Secondary | ICD-10-CM | POA: Diagnosis not present

## 2021-03-19 ENCOUNTER — Other Ambulatory Visit (HOSPITAL_COMMUNITY): Payer: Self-pay | Admitting: Internal Medicine

## 2021-03-19 DIAGNOSIS — I739 Peripheral vascular disease, unspecified: Secondary | ICD-10-CM

## 2021-03-23 DIAGNOSIS — Z950 Presence of cardiac pacemaker: Secondary | ICD-10-CM | POA: Diagnosis not present

## 2021-03-23 DIAGNOSIS — E782 Mixed hyperlipidemia: Secondary | ICD-10-CM | POA: Diagnosis not present

## 2021-03-23 DIAGNOSIS — I6529 Occlusion and stenosis of unspecified carotid artery: Secondary | ICD-10-CM | POA: Diagnosis not present

## 2021-03-23 DIAGNOSIS — E1142 Type 2 diabetes mellitus with diabetic polyneuropathy: Secondary | ICD-10-CM | POA: Diagnosis not present

## 2021-03-23 DIAGNOSIS — I10 Essential (primary) hypertension: Secondary | ICD-10-CM | POA: Diagnosis not present

## 2021-03-23 DIAGNOSIS — R32 Unspecified urinary incontinence: Secondary | ICD-10-CM | POA: Diagnosis not present

## 2021-03-23 DIAGNOSIS — G4700 Insomnia, unspecified: Secondary | ICD-10-CM | POA: Diagnosis not present

## 2021-03-23 DIAGNOSIS — R339 Retention of urine, unspecified: Secondary | ICD-10-CM | POA: Diagnosis not present

## 2021-03-23 DIAGNOSIS — Z7984 Long term (current) use of oral hypoglycemic drugs: Secondary | ICD-10-CM | POA: Diagnosis not present

## 2021-03-23 DIAGNOSIS — E1165 Type 2 diabetes mellitus with hyperglycemia: Secondary | ICD-10-CM | POA: Diagnosis not present

## 2021-03-23 DIAGNOSIS — E042 Nontoxic multinodular goiter: Secondary | ICD-10-CM | POA: Diagnosis not present

## 2021-03-23 DIAGNOSIS — Z9181 History of falling: Secondary | ICD-10-CM | POA: Diagnosis not present

## 2021-03-23 DIAGNOSIS — R1310 Dysphagia, unspecified: Secondary | ICD-10-CM | POA: Diagnosis not present

## 2021-03-23 DIAGNOSIS — I8393 Asymptomatic varicose veins of bilateral lower extremities: Secondary | ICD-10-CM | POA: Diagnosis not present

## 2021-03-23 DIAGNOSIS — M150 Primary generalized (osteo)arthritis: Secondary | ICD-10-CM | POA: Diagnosis not present

## 2021-03-25 DIAGNOSIS — I10 Essential (primary) hypertension: Secondary | ICD-10-CM | POA: Diagnosis not present

## 2021-03-25 DIAGNOSIS — E1142 Type 2 diabetes mellitus with diabetic polyneuropathy: Secondary | ICD-10-CM | POA: Diagnosis not present

## 2021-03-25 DIAGNOSIS — R1310 Dysphagia, unspecified: Secondary | ICD-10-CM | POA: Diagnosis not present

## 2021-03-25 DIAGNOSIS — G4700 Insomnia, unspecified: Secondary | ICD-10-CM | POA: Diagnosis not present

## 2021-03-25 DIAGNOSIS — E1165 Type 2 diabetes mellitus with hyperglycemia: Secondary | ICD-10-CM | POA: Diagnosis not present

## 2021-03-25 DIAGNOSIS — M150 Primary generalized (osteo)arthritis: Secondary | ICD-10-CM | POA: Diagnosis not present

## 2021-03-31 DIAGNOSIS — M150 Primary generalized (osteo)arthritis: Secondary | ICD-10-CM | POA: Diagnosis not present

## 2021-03-31 DIAGNOSIS — E1142 Type 2 diabetes mellitus with diabetic polyneuropathy: Secondary | ICD-10-CM | POA: Diagnosis not present

## 2021-03-31 DIAGNOSIS — G4700 Insomnia, unspecified: Secondary | ICD-10-CM | POA: Diagnosis not present

## 2021-03-31 DIAGNOSIS — E1165 Type 2 diabetes mellitus with hyperglycemia: Secondary | ICD-10-CM | POA: Diagnosis not present

## 2021-03-31 DIAGNOSIS — I10 Essential (primary) hypertension: Secondary | ICD-10-CM | POA: Diagnosis not present

## 2021-03-31 DIAGNOSIS — R1310 Dysphagia, unspecified: Secondary | ICD-10-CM | POA: Diagnosis not present

## 2021-04-06 ENCOUNTER — Other Ambulatory Visit: Payer: Self-pay

## 2021-04-06 ENCOUNTER — Ambulatory Visit (HOSPITAL_COMMUNITY)
Admission: RE | Admit: 2021-04-06 | Discharge: 2021-04-06 | Disposition: A | Discharge status: Home / Self Care | Payer: Medicare Other | Source: Ambulatory Visit | Attending: Cardiovascular Disease | Admitting: Cardiovascular Disease

## 2021-04-06 DIAGNOSIS — I739 Peripheral vascular disease, unspecified: Secondary | ICD-10-CM | POA: Diagnosis not present

## 2021-04-07 DIAGNOSIS — G4700 Insomnia, unspecified: Secondary | ICD-10-CM | POA: Diagnosis not present

## 2021-04-07 DIAGNOSIS — M150 Primary generalized (osteo)arthritis: Secondary | ICD-10-CM | POA: Diagnosis not present

## 2021-04-07 DIAGNOSIS — E1142 Type 2 diabetes mellitus with diabetic polyneuropathy: Secondary | ICD-10-CM | POA: Diagnosis not present

## 2021-04-07 DIAGNOSIS — R1310 Dysphagia, unspecified: Secondary | ICD-10-CM | POA: Diagnosis not present

## 2021-04-07 DIAGNOSIS — I10 Essential (primary) hypertension: Secondary | ICD-10-CM | POA: Diagnosis not present

## 2021-04-07 DIAGNOSIS — E1165 Type 2 diabetes mellitus with hyperglycemia: Secondary | ICD-10-CM | POA: Diagnosis not present

## 2021-04-14 ENCOUNTER — Ambulatory Visit (INDEPENDENT_AMBULATORY_CARE_PROVIDER_SITE_OTHER): Payer: Medicare Other

## 2021-04-14 DIAGNOSIS — I442 Atrioventricular block, complete: Secondary | ICD-10-CM

## 2021-04-14 LAB — CUP PACEART REMOTE DEVICE CHECK
Battery Remaining Longevity: 116 mo
Battery Voltage: 3.01 V
Brady Statistic AP VP Percent: 9.95 %
Brady Statistic AP VS Percent: 0 %
Brady Statistic AS VP Percent: 82.48 %
Brady Statistic AS VS Percent: 7.57 %
Brady Statistic RA Percent Paced: 12.05 %
Brady Statistic RV Percent Paced: 92.43 %
Date Time Interrogation Session: 20220802051801
Implantable Lead Implant Date: 20030311
Implantable Lead Implant Date: 20030311
Implantable Lead Location: 753859
Implantable Lead Location: 753860
Implantable Lead Model: 4092
Implantable Lead Model: 4592
Implantable Pulse Generator Implant Date: 20200803
Lead Channel Impedance Value: 437 Ohm
Lead Channel Impedance Value: 494 Ohm
Lead Channel Impedance Value: 532 Ohm
Lead Channel Impedance Value: 551 Ohm
Lead Channel Pacing Threshold Amplitude: 0.375 V
Lead Channel Pacing Threshold Amplitude: 0.625 V
Lead Channel Pacing Threshold Pulse Width: 0.4 ms
Lead Channel Pacing Threshold Pulse Width: 0.4 ms
Lead Channel Sensing Intrinsic Amplitude: 17.25 mV
Lead Channel Sensing Intrinsic Amplitude: 17.25 mV
Lead Channel Sensing Intrinsic Amplitude: 2.875 mV
Lead Channel Sensing Intrinsic Amplitude: 2.875 mV
Lead Channel Setting Pacing Amplitude: 1.5 V
Lead Channel Setting Pacing Amplitude: 2.5 V
Lead Channel Setting Pacing Pulse Width: 0.4 ms
Lead Channel Setting Sensing Sensitivity: 4 mV

## 2021-04-21 DIAGNOSIS — M25552 Pain in left hip: Secondary | ICD-10-CM | POA: Diagnosis not present

## 2021-04-22 DIAGNOSIS — F0391 Unspecified dementia with behavioral disturbance: Secondary | ICD-10-CM | POA: Diagnosis not present

## 2021-04-22 DIAGNOSIS — R1319 Other dysphagia: Secondary | ICD-10-CM | POA: Diagnosis not present

## 2021-04-22 DIAGNOSIS — M25552 Pain in left hip: Secondary | ICD-10-CM | POA: Diagnosis not present

## 2021-04-23 DIAGNOSIS — R8290 Unspecified abnormal findings in urine: Secondary | ICD-10-CM | POA: Diagnosis not present

## 2021-04-23 DIAGNOSIS — E1165 Type 2 diabetes mellitus with hyperglycemia: Secondary | ICD-10-CM | POA: Diagnosis not present

## 2021-04-27 DIAGNOSIS — H04123 Dry eye syndrome of bilateral lacrimal glands: Secondary | ICD-10-CM | POA: Diagnosis not present

## 2021-04-27 DIAGNOSIS — H0102A Squamous blepharitis right eye, upper and lower eyelids: Secondary | ICD-10-CM | POA: Diagnosis not present

## 2021-04-27 DIAGNOSIS — Z961 Presence of intraocular lens: Secondary | ICD-10-CM | POA: Diagnosis not present

## 2021-04-27 DIAGNOSIS — H16223 Keratoconjunctivitis sicca, not specified as Sjogren's, bilateral: Secondary | ICD-10-CM | POA: Diagnosis not present

## 2021-04-27 DIAGNOSIS — H16143 Punctate keratitis, bilateral: Secondary | ICD-10-CM | POA: Diagnosis not present

## 2021-04-27 DIAGNOSIS — H0102B Squamous blepharitis left eye, upper and lower eyelids: Secondary | ICD-10-CM | POA: Diagnosis not present

## 2021-04-28 ENCOUNTER — Other Ambulatory Visit: Payer: Self-pay

## 2021-04-28 ENCOUNTER — Emergency Department (HOSPITAL_COMMUNITY)
Admission: EM | Admit: 2021-04-28 | Discharge: 2021-04-29 | Disposition: A | Discharge status: Home / Self Care | Payer: Medicare Other | Attending: Emergency Medicine | Admitting: Emergency Medicine

## 2021-04-28 ENCOUNTER — Emergency Department (HOSPITAL_COMMUNITY): Payer: Medicare Other

## 2021-04-28 DIAGNOSIS — M160 Bilateral primary osteoarthritis of hip: Secondary | ICD-10-CM | POA: Diagnosis not present

## 2021-04-28 DIAGNOSIS — Z950 Presence of cardiac pacemaker: Secondary | ICD-10-CM | POA: Diagnosis not present

## 2021-04-28 DIAGNOSIS — Z79899 Other long term (current) drug therapy: Secondary | ICD-10-CM | POA: Diagnosis not present

## 2021-04-28 DIAGNOSIS — Z7982 Long term (current) use of aspirin: Secondary | ICD-10-CM | POA: Insufficient documentation

## 2021-04-28 DIAGNOSIS — I10 Essential (primary) hypertension: Secondary | ICD-10-CM | POA: Diagnosis not present

## 2021-04-28 DIAGNOSIS — M19011 Primary osteoarthritis, right shoulder: Secondary | ICD-10-CM | POA: Diagnosis not present

## 2021-04-28 DIAGNOSIS — E119 Type 2 diabetes mellitus without complications: Secondary | ICD-10-CM | POA: Diagnosis not present

## 2021-04-28 DIAGNOSIS — Z853 Personal history of malignant neoplasm of breast: Secondary | ICD-10-CM | POA: Insufficient documentation

## 2021-04-28 DIAGNOSIS — K5900 Constipation, unspecified: Secondary | ICD-10-CM | POA: Insufficient documentation

## 2021-04-28 DIAGNOSIS — M19012 Primary osteoarthritis, left shoulder: Secondary | ICD-10-CM | POA: Diagnosis not present

## 2021-04-28 DIAGNOSIS — Z96653 Presence of artificial knee joint, bilateral: Secondary | ICD-10-CM | POA: Diagnosis not present

## 2021-04-28 DIAGNOSIS — Z7984 Long term (current) use of oral hypoglycemic drugs: Secondary | ICD-10-CM | POA: Insufficient documentation

## 2021-04-28 NOTE — ED Triage Notes (Signed)
Pt BIB [facility] from home c/o constipation x2 days States has not had regular bowel movement. Has been eating and drink per normal. Took stool softener at 3 pm without relief. Denies NVD and abd pain.

## 2021-04-28 NOTE — ED Provider Notes (Signed)
Emergency Medicine Provider Triage Evaluation Note  Maria [name] , a 87 y.o. female  was evaluated in triage.  Pt complains of constipation.  States she has not had a "good" BM in 2 days but has passed some stool.  Denies any nausea/vomiting.  She has been eating/drinking.  She tried stool softener earlier today but then realized it was expired.  Has had prior cholecystectomy.  No hx of SBO.  Denies any real abdominal pain currently.    Review of Systems  Positive: constipation Negative: Nausea, vomiting  Physical Exam  BP (!) 151/85 (BP Location: Left Arm)   Pulse 78   Temp 97.8 F (36.6 C) (Oral)   Resp 16   Ht '5\' 4"'$  (1.626 m)   Wt 66.7 kg   SpO2 99%   BMI 25.23 kg/m  Gen:   Awake, no distress   Resp:  Normal effort  MSK:   Moves extremities without difficulty  Other:  Abdomen soft, non-tender, + BS throughout  Medical Decision Making  Medically screening exam initiated at 11:51 PM.  Appropriate orders placed.  Maria [name] was informed that the remainder of the evaluation will be completed by another provider, this initial triage assessment does not replace that evaluation, and the importance of remaining in the ED until their evaluation is complete.  Will get screening labs, acute abd series.   Larene Pickett, PA-C 04/28/21 [place], [place], DO 04/29/21 [phone]

## 2021-04-29 DIAGNOSIS — M19012 Primary osteoarthritis, left shoulder: Secondary | ICD-10-CM | POA: Diagnosis not present

## 2021-04-29 DIAGNOSIS — M160 Bilateral primary osteoarthritis of hip: Secondary | ICD-10-CM | POA: Diagnosis not present

## 2021-04-29 DIAGNOSIS — K5900 Constipation, unspecified: Secondary | ICD-10-CM | POA: Diagnosis not present

## 2021-04-29 DIAGNOSIS — M19011 Primary osteoarthritis, right shoulder: Secondary | ICD-10-CM | POA: Diagnosis not present

## 2021-04-29 LAB — CBC WITH DIFFERENTIAL/PLATELET
Abs Immature Granulocytes: 0.09 10*3/uL — ABNORMAL HIGH (ref 0.00–0.07)
Basophils Absolute: 0 10*3/uL (ref 0.0–0.1)
Basophils Relative: 0 %
Eosinophils Absolute: 0 10*3/uL (ref 0.0–0.5)
Eosinophils Relative: 0 %
HCT: 44.8 % (ref 36.0–46.0)
Hemoglobin: 14.6 g/dL (ref 12.0–15.0)
Immature Granulocytes: 1 %
Lymphocytes Relative: 22 %
Lymphs Abs: 2.2 10*3/uL (ref 0.7–4.0)
MCH: 28.9 pg (ref 26.0–34.0)
MCHC: 32.6 g/dL (ref 30.0–36.0)
MCV: 88.5 fL (ref 80.0–100.0)
Monocytes Absolute: 0.6 10*3/uL (ref 0.1–1.0)
Monocytes Relative: 6 %
Neutro Abs: 7.1 10*3/uL (ref 1.7–7.7)
Neutrophils Relative %: 71 %
Platelets: 271 10*3/uL (ref 150–400)
RBC: 5.06 MIL/uL (ref 3.87–5.11)
RDW: 13 % (ref 11.5–15.5)
WBC: 10 10*3/uL (ref 4.0–10.5)
nRBC: 0 % (ref 0.0–0.2)

## 2021-04-29 LAB — COMPREHENSIVE METABOLIC PANEL
ALT: 34 U/L (ref 0–44)
AST: 33 U/L (ref 15–41)
Albumin: 4.1 g/dL (ref 3.5–5.0)
Alkaline Phosphatase: 41 U/L (ref 38–126)
Anion gap: 15 (ref 5–15)
BUN: 23 mg/dL (ref 8–23)
CO2: 23 mmol/L (ref 22–32)
Calcium: 10.1 mg/dL (ref 8.9–10.3)
Chloride: 95 mmol/L — ABNORMAL LOW (ref 98–111)
Creatinine, Ser: 0.87 mg/dL (ref 0.44–1.00)
GFR, Estimated: >60 mL/min (ref >60)
Glucose, Bld: 262 mg/dL — ABNORMAL HIGH (ref 70–99)
Potassium: 4.4 mmol/L (ref 3.5–5.1)
Sodium: 133 mmol/L — ABNORMAL LOW (ref 135–145)
Total Bilirubin: 1 mg/dL (ref 0.3–1.2)
Total Protein: 7.7 g/dL (ref 6.5–8.1)

## 2021-04-29 LAB — LIPASE, BLOOD: Lipase: 22 U/L (ref 11–51)

## 2021-04-29 MED ORDER — BISACODYL 10 MG RE SUPP
10.0000 mg | Freq: Once | RECTAL | Status: AC
  Administered 2021-04-29: 10 mg via RECTAL
  Filled 2021-04-29: qty 1

## 2021-04-29 MED ORDER — BISACODYL 10 MG RE SUPP: 10.0000 mg | Freq: Every day | RECTAL | 0 refills | Status: DC | PRN

## 2021-04-29 MED ORDER — IOHEXOL 9 MG/ML PO SOLN: 500.0000 mL | ORAL | Status: AC

## 2021-04-29 MED ORDER — BISACODYL 5 MG PO TBEC: 5.0000 mg | DELAYED_RELEASE_TABLET | Freq: Two times a day (BID) | ORAL | 0 refills | Status: AC

## 2021-04-29 NOTE — ED Notes (Signed)
Pt had moderate sized bowel movement and reports relief of symptoms.

## 2021-04-29 NOTE — Discharge Instructions (Addendum)
You are being managed for constipation today.  This constipation may be related to your Norco opioid use, which can cause constipation.  Please stop using the Norco and talk to your primary care doctor about this issue.  We were able to get you to have a bowel movement with a suppository pill today.  I prescribed some these medications to use.  If you do not have another normal bowel movement by 5 PM today, you can try a Dulcolax tablet by mouth as well as another suppository in your rectum.  This should produce a bowel movement overnight.  If you do not have a bowel movement, you can take these again in the morning.  I prescribed these medicines to use at home.  If you begin having bad vomiting, bad pain in your abdomen or lower belly, cannot keep down food or fluids, please come back to the emergency department.

## 2021-04-29 NOTE — ED Provider Notes (Signed)
[facility] EMERGENCY DEPARTMENT Provider Note   CSN: [id number] Arrival date & time: 04/28/21  2347     History Chief Complaint  Patient presents with   Constipation    Maria [name] is a 87 y.o. female with a history of constipation presenting to the ED with concern for constipation.  The patient reports that she has not had a normal bowel movement in about 2 to 3 days.  She describes a sensation of rectal pressure with her bowel movement.  She does not feel she has passed gas.  She denies nausea, vomiting, poor appetite.  HPI     Past Medical History:  Diagnosis Date   Anxiety    Arthritis    Cancer ([place])    breast 2015   Cardiac pacemaker in situ    Chest pain    Dyslipidemia    Dyspnea    with exertion   Family history of adverse reaction to anesthesia    daughter slow to wake up   Gallstones    Headache    Hypertension    Macular degeneration    Pulmonary hypertension ([place])    PA peak pressure 81 mmHg by 09/2016 echo   Type II or unspecified type diabetes mellitus without mention of complication, not stated as uncontrolled    Wears partial dentures     Patient Active Problem List   Diagnosis Date Noted   Severe sepsis ([place]) 08/08/2020   Acute kidney injury ([place]) 08/08/2020   Acute UTI (urinary tract infection) 08/08/2020   Elevated lactic acid level 10/17/2019   Generalized weakness 10/17/2019   Hypokalemia 10/17/2019   Dehydration 10/17/2019   Acute encephalopathy 10/16/2019   Heart block AV complete ([place]) 04/13/2019   Acute acalculous cholecystitis 10/07/2016   Elevated troponin 10/07/2016   Lactic acidosis 10/07/2016   Sinus tachycardia 10/07/2016   Dyslipidemia 06/05/2013   Cardiac pacemaker 05/30/2013   DM type 2 (diabetes mellitus, type 2) ([place]) 05/21/2013   Chest pain 05/20/2013   HTN (hypertension) 05/20/2013    Past Surgical History:  Procedure Laterality Date   BREAST EXCISIONAL BIOPSY Right 2010   CATARACT  EXTRACTION     CHOLECYSTECTOMY N/A 01/18/2017   Procedure: LAPAROSCOPIC CHOLECYSTECTOMY;  Surgeon: Ralene Ok, MD;  Location: [facility];  Service: General;  Laterality: N/A;   IR GENERIC HISTORICAL  10/08/2016   IR PERC CHOLECYSTOSTOMY 10/08/2016 Greggory Keen, MD [facility]   IR RADIOLOGIST EVAL & MGMT  11/16/2016   KNEE SURGERY Bilateral    MULTIPLE TOOTH EXTRACTIONS     PACEMAKER INSERTION     [employer], [facility] 2003   PACEMAKER INSERTION     PPM GENERATOR CHANGEOUT N/A 04/16/2019   Procedure: PPM GENERATOR CHANGEOUT;  Surgeon: Evans Lance, MD;  Location: [facility];  Service: Cardiovascular;  Laterality: N/A;   TOTAL KNEE ARTHROPLASTY Bilateral      OB History     Gravida  0   Para  0   Term  0   Preterm  0   AB  0   Living         SAB  0   IAB  0   Ectopic  0   Multiple      Live Births              Family History  Problem Relation Age of Onset   Colon cancer Neg Hx     Social History   Tobacco Use   Smoking  status: Never   Smokeless tobacco: Current    Types: Snuff  Vaping Use   Vaping Use: Never used  Substance Use Topics   Alcohol use: No    Alcohol/week: 1.0 standard drink    Types: 1 Cans of beer per week   Drug use: No    Home Medications Prior to Admission medications   Medication Sig Start Date End Date Taking? Authorizing Provider  bisacodyl (DULCOLAX) 10 MG suppository Place 1 suppository (10 mg total) rectally daily as needed for up to 15 doses for severe constipation. 04/29/21  Yes Cosima Prentiss, Carola Rhine, MD  bisacodyl (DULCOLAX) 5 MG EC tablet Take 1 tablet (5 mg total) by mouth 2 (two) times daily for 10 days. 04/29/21 05/09/21 Yes Wilba Mutz, Carola Rhine, MD  acetaminophen (TYLENOL) 500 MG tablet Take 1,000 mg by mouth in the morning and at bedtime.    [provider]  amLODipine (NORVASC) 10 MG tablet Take 10 mg by mouth daily.    [provider]  aspirin EC 81 MG tablet Take 81 mg by mouth daily.     [provider]  Blood Glucose Monitoring Suppl (FREESTYLE LITE) DEVI 1 each by Other route 3 (three) times a week.  08/28/14   [provider]  carboxymethylcellul-glycerin (OPTIVE) 0.5-0.9 % ophthalmic solution Place 1 drop into both eyes daily.    [provider]  clotrimazole-betamethasone (LOTRISONE) cream Apply 1 application topically 2 (two) times daily as needed (rash). 12/15/20   [provider]  cycloSPORINE (RESTASIS) 0.05 % ophthalmic emulsion Place 1 drop into both eyes daily.     [provider]  donepezil (ARICEPT) 10 MG tablet Take 10 mg by mouth at bedtime.    [provider]  FREESTYLE LITE test strip 1 each by Other route 3 (three) times a week.  08/28/14   [provider]  glimepiride (AMARYL) 1 MG tablet Take 1 mg by mouth in the morning and at bedtime. 10/23/19   [provider]  Lancets (FREESTYLE) lancets 1 each by Other route 3 (three) times a week.  08/28/14   [provider]  latanoprost (XALATAN) 0.005 % ophthalmic solution Place 1 drop into both eyes at bedtime. 06/10/20   [provider]  meclizine (ANTIVERT) 25 MG tablet Take 25 mg by mouth 3 (three) times daily as needed (vertigo).     [provider]  meloxicam (MOBIC) 7.5 MG tablet Take 7.5 mg by mouth daily.    [provider]  mirtazapine (REMERON) 7.5 MG tablet Take 7.5 mg by mouth at bedtime. 11/03/20   [provider]  Multiple Vitamins-Minerals (PRESERVISION AREDS 2+MULTI VIT PO) Take 1 capsule by mouth daily.    [provider]  nitroGLYCERIN (NITROSTAT) 0.4 MG SL tablet Place 0.4 mg under the tongue every 5 (five) minutes as needed for chest pain. 06/10/20   [provider]  omeprazole (PRILOSEC) 20 MG capsule Take 20 mg by mouth daily as needed (acid reflux). 11/03/20   [provider]  ondansetron (ZOFRAN ODT) 4 MG disintegrating tablet Take 1 tablet (4 mg total) by mouth  every 8 (eight) hours as needed for nausea or vomiting. 12/19/18   Darlin Drop P, PA-C  QUEtiapine (SEROQUEL) 25 MG tablet Take 25 mg by mouth every evening. 10/09/20   [provider]  rosuvastatin (CRESTOR) 5 MG tablet Take 5 mg by mouth daily. 11/19/19   [provider]  valACYclovir (VALTREX) 1000 MG tablet Take 1,000 mg by mouth as  needed (breakouts).    [provider]    Allergies    Vancomycin  Review of Systems   Review of Systems  Constitutional:  Negative for chills and fever.  Eyes:  Negative for pain and visual disturbance.  Respiratory:  Negative for cough and shortness of breath.   Cardiovascular:  Negative for chest pain and palpitations.  Gastrointestinal:  Positive for constipation. Negative for abdominal pain, diarrhea, nausea and vomiting.  Genitourinary:  Negative for dysuria and hematuria.  Musculoskeletal:  Negative for arthralgias and myalgias.  Skin:  Negative for color change and rash.  Neurological:  Negative for syncope and headaches.  All other systems reviewed and are negative.  Physical Exam Updated Vital Signs BP (!) 182/96   Pulse (!) 57   Temp 97.9 F (36.6 C) (Oral)   Resp 18   Ht '5\' 4"'$  (1.626 m)   Wt 66.7 kg   SpO2 98%   BMI 25.23 kg/m   Physical Exam Constitutional:      General: She is not in acute distress. HENT:     Head: Normocephalic and atraumatic.  Eyes:     Conjunctiva/sclera: Conjunctivae normal.     Pupils: Pupils are equal, round, and reactive to light.  Cardiovascular:     Rate and Rhythm: Normal rate and regular rhythm.  Pulmonary:     Effort: Pulmonary effort is normal. No respiratory distress.  Abdominal:     General: There is no distension.     Tenderness: There is no abdominal tenderness.  Genitourinary:    Comments: Soft stool in rectal vault Skin:    General: Skin is warm and dry.  Neurological:     General: No focal deficit present.     Mental Status: She is alert. Mental status  is at baseline.    ED Results / Procedures / Treatments   Labs (all labs ordered are listed, but only abnormal results are displayed) Labs Reviewed  CBC WITH DIFFERENTIAL/PLATELET - Abnormal; Notable for the following components:      Result Value   Abs Immature Granulocytes 0.09 (*)    All other components within normal limits  COMPREHENSIVE METABOLIC PANEL - Abnormal; Notable for the following components:   Sodium 133 (*)    Chloride 95 (*)    Glucose, Bld 262 (*)    All other components within normal limits  LIPASE, BLOOD  URINALYSIS, ROUTINE W REFLEX MICROSCOPIC    EKG None  Radiology DG ABD ACUTE 2+V W 1V CHEST  Result Date: 04/29/2021 CLINICAL DATA:  Constipation x2 days EXAM: DG ABDOMEN ACUTE WITH 1 VIEW CHEST COMPARISON:  CT abdomen/pelvis dated 04/18/2020 FINDINGS: Lungs are clear.  No pleural effusion or pneumothorax. Heart is normal in size.  Left subclavian pacemaker. Nonobstructive bowel gas pattern. No evidence of free air under the diaphragm on the upright view. Normal colonic stool burden. Degenerative changes of the bilateral shoulders. Degenerative changes of the lower lumbar spine. Mild degenerative changes of the bilateral hips, left greater than right. IMPRESSION: No evidence of acute cardiopulmonary disease. No evidence of small bowel obstruction or free air. Normal colonic stool burden. Electronically Signed   By: Julian Hy M.D.   On: 04/29/2021 00:16    Procedures Procedures   Medications Ordered in ED Medications  iohexol (OMNIPAQUE) 9 MG/ML oral solution 500 mL (has no administration in time range)  bisacodyl (DULCOLAX) suppository 10 mg (10 mg Rectal Given 04/29/21 0805)    ED Course  I have reviewed the triage vital  signs and the nursing notes.  Pertinent labs & imaging results that were available during my care of the patient were reviewed by me and considered in my medical decision making (see chart for details).  Ddx includes functional  constipation vs. Impaction vs other  Benign abdominal exam,  no focal ttp on exam or other GI symptoms to suggest acute colitis/diverticulitis, SBO, or GI perforation.  DG abdomen films reviewed showing no obvious SBO. Labs reviewed - CBC and CMP wnl, mild hyperglycemia.   Lipase wnl.   No evidence of anal fissure, thrombosed hemorrhoid, or prolapse on rectal exam.  No significant rectal tenderness.  This is most likely opioid-induced constipation in the setting of recent norco usage for pain. Patient produced a BM with a suppository.  She tolerated PO here.  I felt she was reasonably safe for discharge home with her daughter (at bedside) on a bowel regimen, and advised stopping norco and PCP f/u.  Clinical Course as of 04/29/21 1054  Wed Apr 29, 2021  0823 I spoke to the patient's daughter was on her way to the hospital.  She reports the patient was recently started on Norco 7 days ago for chronic pelvic pain.  She was only prescribed 15 tablets but is taking 12 of these.  She wonders whether this may be opioid induced constipation, which it very well may be.  We are attempting a suppository as well as starting iodinated contrast for a possible CT scan if the patient is unable to void her bowels. [MT]  [phone] The patient had a moderate sized bowel movement.  I believed we have relieved the obstruction.  I will prescribe her Senokot and advised to continue to use Dulcolax suppositories as needed, and otherwise to attempt to avoid narcotics which may be constipating.  The patient and her daughter verbalized understanding.  CT scan canceled [MT]    Clinical Course User Index [MT] Jacier Gladu, Carola Rhine, MD    Final Clinical Impression(s) / ED Diagnoses Final diagnoses:  Constipation, unspecified constipation type    Rx / DC Orders ED Discharge Orders          Ordered    bisacodyl (DULCOLAX) 5 MG EC tablet  2 times daily        04/29/21 0937    bisacodyl (DULCOLAX) 10 MG suppository  Daily PRN         04/29/21 0937             Wyvonnia Dusky, MD 04/29/21 1054

## 2021-05-07 ENCOUNTER — Ambulatory Visit (INDEPENDENT_AMBULATORY_CARE_PROVIDER_SITE_OTHER): Payer: Medicare Other | Admitting: Sports Medicine

## 2021-05-07 ENCOUNTER — Encounter: Payer: Self-pay | Admitting: Sports Medicine

## 2021-05-07 ENCOUNTER — Other Ambulatory Visit: Payer: Self-pay

## 2021-05-07 DIAGNOSIS — M2040 Other hammer toe(s) (acquired), unspecified foot: Secondary | ICD-10-CM | POA: Diagnosis not present

## 2021-05-07 DIAGNOSIS — B351 Tinea unguium: Secondary | ICD-10-CM

## 2021-05-07 DIAGNOSIS — E1142 Type 2 diabetes mellitus with diabetic polyneuropathy: Secondary | ICD-10-CM | POA: Diagnosis not present

## 2021-05-07 DIAGNOSIS — M79609 Pain in unspecified limb: Secondary | ICD-10-CM | POA: Diagnosis not present

## 2021-05-07 NOTE — Progress Notes (Signed)
Subjective: Maria [name] is a 87 y.o. female patient with history of diabetes who presents to office today complaining of long,mildly painful nails  while ambulating in shoes; unable to trim. Patient states that the glucose reading this morning was 180 mg/dl.  A1c 7.  PCP Dr. Maia Petties last visit 2 weeks ago.  Patient also admits some pins-and-needles sensation and aches and pains in both feet.  Patient Active Problem List   Diagnosis Date Noted   Severe sepsis ([place]) 08/08/2020   Acute kidney injury ([place]) 08/08/2020   Acute UTI (urinary tract infection) 08/08/2020   Elevated lactic acid level 10/17/2019   Generalized weakness 10/17/2019   Hypokalemia 10/17/2019   Dehydration 10/17/2019   Acute encephalopathy 10/16/2019   Heart block AV complete ([place]) 04/13/2019   Acute acalculous cholecystitis 10/07/2016   Elevated troponin 10/07/2016   Lactic acidosis 10/07/2016   Sinus tachycardia 10/07/2016   Dyslipidemia 06/05/2013   Cardiac pacemaker 05/30/2013   DM type 2 (diabetes mellitus, type 2) ([place]) 05/21/2013   Chest pain 05/20/2013   HTN (hypertension) 05/20/2013   Current Outpatient Medications on File Prior to Visit  Medication Sig Dispense Refill   acetaminophen (TYLENOL) 500 MG tablet Take 1,000 mg by mouth in the morning and at bedtime.     amLODipine (NORVASC) 10 MG tablet Take 10 mg by mouth daily.     aspirin EC 81 MG tablet Take 81 mg by mouth daily.     bisacodyl (DULCOLAX) 10 MG suppository Place 1 suppository (10 mg total) rectally daily as needed for up to 15 doses for severe constipation. 15 suppository 0   bisacodyl (DULCOLAX) 5 MG EC tablet Take 1 tablet (5 mg total) by mouth 2 (two) times daily for 10 days. 20 tablet 0   Blood Glucose Monitoring Suppl (FREESTYLE LITE) DEVI 1 each by Other route 3 (three) times a week.   0   carboxymethylcellul-glycerin (OPTIVE) 0.5-0.9 % ophthalmic solution Place 1 drop into both eyes daily.     clotrimazole-betamethasone (LOTRISONE)  cream Apply 1 application topically 2 (two) times daily as needed (rash).     cycloSPORINE (RESTASIS) 0.05 % ophthalmic emulsion Place 1 drop into both eyes daily.      donepezil (ARICEPT) 10 MG tablet Take 10 mg by mouth at bedtime.     FREESTYLE LITE test strip 1 each by Other route 3 (three) times a week.   4   glimepiride (AMARYL) 1 MG tablet Take 1 mg by mouth in the morning and at bedtime.     Lancets (FREESTYLE) lancets 1 each by Other route 3 (three) times a week.   4   latanoprost (XALATAN) 0.005 % ophthalmic solution Place 1 drop into both eyes at bedtime.     meclizine (ANTIVERT) 25 MG tablet Take 25 mg by mouth 3 (three) times daily as needed (vertigo).      meloxicam (MOBIC) 7.5 MG tablet Take 7.5 mg by mouth daily.     mirtazapine (REMERON) 7.5 MG tablet Take 7.5 mg by mouth at bedtime.     Multiple Vitamins-Minerals (PRESERVISION AREDS 2+MULTI VIT PO) Take 1 capsule by mouth daily.     nitroGLYCERIN (NITROSTAT) 0.4 MG SL tablet Place 0.4 mg under the tongue every 5 (five) minutes as needed for chest pain.     omeprazole (PRILOSEC) 20 MG capsule Take 20 mg by mouth daily as needed (acid reflux).     ondansetron (ZOFRAN ODT) 4 MG disintegrating tablet Take 1 tablet (4 mg total) by  mouth every 8 (eight) hours as needed for nausea or vomiting. 20 tablet 0   QUEtiapine (SEROQUEL) 25 MG tablet Take 25 mg by mouth every evening.     rosuvastatin (CRESTOR) 5 MG tablet Take 5 mg by mouth daily.     valACYclovir (VALTREX) 1000 MG tablet Take 1,000 mg by mouth as needed (breakouts).     No current facility-administered medications on file prior to visit.   Allergies  Allergen Reactions   Vancomycin Itching    Recent Results (from the past 2160 hour(s))  CUP PACEART REMOTE DEVICE CHECK     Status: None   Collection Time: 04/14/21  5:18 AM  Result Value Ref Range   Date Time Interrogation Session [id number]    Pulse Generator Manufacturer MERM    Pulse Gen Model [id number] Azure XT  DR MRI    Pulse Gen Serial Number [id number]    Clinic Name [facility]    Implantable Pulse Generator Type Implantable Pulse Generator    Implantable Pulse Generator Implant Date [id number]    Implantable Lead Manufacturer MERM    Implantable Lead Model 4092 CapSure SP Novus    Implantable Lead Serial Number [id number]    Implantable Lead Implant Date [id number]    Implantable Lead Location [id number]    Implantable Lead Manufacturer MERM    Implantable Lead Model 4592 CapSure SP Novus    Implantable Lead Serial Number [id number]    Implantable Lead Implant Date [id number]    Implantable Lead Location [phone]    Lead Channel Setting Sensing Sensitivity 4 mV   Lead Channel Setting Pacing Amplitude 1.5 V   Lead Channel Setting Pacing Pulse Width 0.4 ms   Lead Channel Setting Pacing Amplitude 2.5 V   Lead Channel Impedance Value 551 ohm   Lead Channel Impedance Value 532 ohm   Lead Channel Sensing Intrinsic Amplitude 2.875 mV   Lead Channel Sensing Intrinsic Amplitude 2.875 mV   Lead Channel Pacing Threshold Amplitude 0.375 V   Lead Channel Pacing Threshold Pulse Width 0.4 ms   Lead Channel Impedance Value 494 ohm   Lead Channel Impedance Value 437 ohm   Lead Channel Sensing Intrinsic Amplitude 17.25 mV   Lead Channel Sensing Intrinsic Amplitude 17.25 mV   Lead Channel Pacing Threshold Amplitude 0.625 V   Lead Channel Pacing Threshold Pulse Width 0.4 ms   Battery Status OK    Battery Remaining Longevity 116 mo   Battery Voltage 3.01 V   Brady Statistic RA Percent Paced 12.05 %   Brady Statistic RV Percent Paced 92.43 %   Brady Statistic AP VP Percent 9.95 %   Brady Statistic AS VP Percent 82.48 %   Brady Statistic AP VS Percent 0 %   Brady Statistic AS VS Percent 7.57 %  CBC with Differential     Status: Abnormal   Collection Time: 04/29/21 12:00 AM  Result Value Ref Range   WBC 10.0 4.0 - 10.5 K/uL   RBC 5.06 3.87 - 5.11 MIL/uL   Hemoglobin 14.6 12.0 - 15.0 g/dL   HCT 44.8 36.0 -  46.0 %   MCV 88.5 80.0 - 100.0 fL   MCH 28.9 26.0 - 34.0 pg   MCHC 32.6 30.0 - 36.0 g/dL   RDW 13.0 11.5 - 15.5 %   Platelets 271 150 - 400 K/uL   nRBC 0.0 0.0 - 0.2 %   Neutrophils Relative % 71 %   Neutro Abs 7.1 1.7 - 7.7 K/uL   Lymphocytes Relative 22 %  Lymphs Abs 2.2 0.7 - 4.0 K/uL   Monocytes Relative 6 %   Monocytes Absolute 0.6 0.1 - 1.0 K/uL   Eosinophils Relative 0 %   Eosinophils Absolute 0.0 0.0 - 0.5 K/uL   Basophils Relative 0 %   Basophils Absolute 0.0 0.0 - 0.1 K/uL   Immature Granulocytes 1 %   Abs Immature Granulocytes 0.09 (H) 0.00 - 0.07 K/uL    Comment: Performed at [facility] [address], [place], [place] [address]  Comprehensive metabolic panel     Status: Abnormal   Collection Time: 04/29/21 12:00 AM  Result Value Ref Range   Sodium 133 (L) 135 - 145 mmol/L   Potassium 4.4 3.5 - 5.1 mmol/L   Chloride 95 (L) 98 - 111 mmol/L   CO2 23 22 - 32 mmol/L   Glucose, Bld 262 (H) 70 - 99 mg/dL    Comment: Glucose reference range applies only to samples taken after fasting for at least 8 hours.   BUN 23 8 - 23 mg/dL   Creatinine, Ser 0.87 0.44 - 1.00 mg/dL   Calcium 10.1 8.9 - 10.3 mg/dL   Total Protein 7.7 6.5 - 8.1 g/dL   Albumin 4.1 3.5 - 5.0 g/dL   AST 33 15 - 41 U/L   ALT 34 0 - 44 U/L   Alkaline Phosphatase 41 38 - 126 U/L   Total Bilirubin 1.0 0.3 - 1.2 mg/dL   GFR, Estimated >60 >60 mL/min    Comment: (NOTE) Calculated using the CKD-EPI Creatinine Equation (2021)    Anion gap 15 5 - 15    Comment: Performed at [facility] [address] [place], [place] [address]  Lipase, blood     Status: None   Collection Time: 04/29/21 12:00 AM  Result Value Ref Range   Lipase 22 11 - 51 U/L    Comment: Performed at [facility] [address] [place],  [address]    Objective: General: Patient is awake, alert, and oriented x 3 and in no acute distress.  Integument: Skin is warm, dry and supple bilateral. Nails are  tender, long, thickened and  dystrophic with subungual debris, consistent with onychomycosis, 1-5 bilateral. No signs of infection. No open lesions or preulcerative lesions present bilateral. Remaining integument unremarkable.  Vasculature:  Dorsalis Pedis pulse 1/4 bilateral. Posterior Tibial pulse 1/4 bilateral.  Capillary fill time <3 sec 1-5 bilateral. Positive hair growth to the level of the digits. Temperature gradient within normal limits. No varicosities present bilateral. No edema present bilateral.   Neurology: The patient has intact sensation measured with a 5.07/10g Semmes Weinstein Monofilament at all pedal sites bilateral . Vibratory sensation diminished bilateral with tuning fork. No Babinski sign present bilateral.   Musculoskeletal: Hammertoe pedal deformities noted bilateral. Muscular strength 5/5 in all lower extremity muscular groups bilateral without pain on range of motion . No tenderness with calf compression bilateral.  Assessment and Plan: Problem List Items Addressed This Visit   None Visit Diagnoses     Pain due to onychomycosis of nail    -  Primary   Diabetic polyneuropathy associated with type 2 diabetes mellitus (HCC)       Hammer toe, unspecified laterality           -Examined patient. -Discussed and educated patient on diabetic foot care, especially with  regards to the vascular, neurological and musculoskeletal systems.  -Stressed the importance of good glycemic control and the detriment of not  controlling glucose levels in relation to the foot. -Advised patient to dry well in between toes and to avoid putting lotions or creams in between toes -Mechanically debrided all nails 1-5 bilateral using sterile nail nipper and filed with dremel without incident  -Advised patient to try nervive nerve relief to see if this will help with her neuropathy pains -Answered all patient questions -Patient to return  in 3 months for at risk foot care -Patient advised  to call the office if any problems or questions arise in the meantime.  Landis Martins, DPM

## 2021-05-07 NOTE — Patient Instructions (Signed)
Nervive nerve relief supplement for your nerves can be purchased OTC at [facility]  You have neuritis which is inflammation of the nerves in your feet. This is a form of neuropathy

## 2021-05-09 NOTE — Progress Notes (Signed)
Remote pacemaker transmission.   

## 2021-05-26 DIAGNOSIS — H16223 Keratoconjunctivitis sicca, not specified as Sjogren's, bilateral: Secondary | ICD-10-CM | POA: Diagnosis not present

## 2021-05-26 DIAGNOSIS — H5213 Myopia, bilateral: Secondary | ICD-10-CM | POA: Diagnosis not present

## 2021-05-26 DIAGNOSIS — Z961 Presence of intraocular lens: Secondary | ICD-10-CM | POA: Diagnosis not present

## 2021-05-26 DIAGNOSIS — H524 Presbyopia: Secondary | ICD-10-CM | POA: Diagnosis not present

## 2021-05-26 DIAGNOSIS — H40023 Open angle with borderline findings, high risk, bilateral: Secondary | ICD-10-CM | POA: Diagnosis not present

## 2021-05-26 DIAGNOSIS — E119 Type 2 diabetes mellitus without complications: Secondary | ICD-10-CM | POA: Diagnosis not present

## 2021-05-26 DIAGNOSIS — H52223 Regular astigmatism, bilateral: Secondary | ICD-10-CM | POA: Diagnosis not present

## 2021-05-26 DIAGNOSIS — H0102A Squamous blepharitis right eye, upper and lower eyelids: Secondary | ICD-10-CM | POA: Diagnosis not present

## 2021-05-26 DIAGNOSIS — H0102B Squamous blepharitis left eye, upper and lower eyelids: Secondary | ICD-10-CM | POA: Diagnosis not present

## 2021-05-26 DIAGNOSIS — H35033 Hypertensive retinopathy, bilateral: Secondary | ICD-10-CM | POA: Diagnosis not present

## 2021-06-02 ENCOUNTER — Other Ambulatory Visit: Payer: Self-pay | Admitting: Internal Medicine

## 2021-06-02 DIAGNOSIS — E041 Nontoxic single thyroid nodule: Secondary | ICD-10-CM

## 2021-06-23 DIAGNOSIS — R1319 Other dysphagia: Secondary | ICD-10-CM | POA: Diagnosis not present

## 2021-06-23 DIAGNOSIS — M25561 Pain in right knee: Secondary | ICD-10-CM | POA: Diagnosis not present

## 2021-06-23 DIAGNOSIS — M25552 Pain in left hip: Secondary | ICD-10-CM | POA: Diagnosis not present

## 2021-06-23 DIAGNOSIS — E1165 Type 2 diabetes mellitus with hyperglycemia: Secondary | ICD-10-CM | POA: Diagnosis not present

## 2021-06-23 DIAGNOSIS — F320 Major depressive disorder, single episode, mild: Secondary | ICD-10-CM | POA: Diagnosis not present

## 2021-06-23 DIAGNOSIS — M25562 Pain in left knee: Secondary | ICD-10-CM | POA: Diagnosis not present

## 2021-06-23 DIAGNOSIS — F03918 Unspecified dementia, unspecified severity, with other behavioral disturbance: Secondary | ICD-10-CM | POA: Diagnosis not present

## 2021-06-23 DIAGNOSIS — E042 Nontoxic multinodular goiter: Secondary | ICD-10-CM | POA: Diagnosis not present

## 2021-06-23 DIAGNOSIS — E785 Hyperlipidemia, unspecified: Secondary | ICD-10-CM | POA: Diagnosis not present

## 2021-07-06 DIAGNOSIS — M160 Bilateral primary osteoarthritis of hip: Secondary | ICD-10-CM | POA: Diagnosis not present

## 2021-07-14 DIAGNOSIS — Z20828 Contact with and (suspected) exposure to other viral communicable diseases: Secondary | ICD-10-CM | POA: Diagnosis not present

## 2021-07-16 ENCOUNTER — Telehealth: Payer: Self-pay

## 2021-07-16 NOTE — Telephone Encounter (Signed)
I spoke with the patient daughter Dolores Lory and she states the patient monitor was an Museum/gallery conservator (orange) color. I called tech support to get her additional help. [employer] is going to call the patient in 10 minutes.

## 2021-07-22 DIAGNOSIS — M48062 Spinal stenosis, lumbar region with neurogenic claudication: Secondary | ICD-10-CM | POA: Diagnosis not present

## 2021-07-23 ENCOUNTER — Telehealth: Payer: Self-pay

## 2021-07-23 NOTE — Telephone Encounter (Signed)
[employer] is sending the patient a new monitor in 3-10 business days.

## 2021-07-24 DIAGNOSIS — M5416 Radiculopathy, lumbar region: Secondary | ICD-10-CM | POA: Diagnosis not present

## 2021-08-13 ENCOUNTER — Other Ambulatory Visit: Payer: Self-pay

## 2021-08-13 ENCOUNTER — Ambulatory Visit (INDEPENDENT_AMBULATORY_CARE_PROVIDER_SITE_OTHER): Payer: Medicare Other | Admitting: Sports Medicine

## 2021-08-13 ENCOUNTER — Encounter: Payer: Self-pay | Admitting: Sports Medicine

## 2021-08-13 DIAGNOSIS — B351 Tinea unguium: Secondary | ICD-10-CM | POA: Diagnosis not present

## 2021-08-13 DIAGNOSIS — E1142 Type 2 diabetes mellitus with diabetic polyneuropathy: Secondary | ICD-10-CM

## 2021-08-13 DIAGNOSIS — M79609 Pain in unspecified limb: Secondary | ICD-10-CM | POA: Diagnosis not present

## 2021-08-13 NOTE — Patient Instructions (Signed)
Nervive nerve relief supplement for your nerves can be purchased OTC at [facility]  

## 2021-08-13 NOTE — Progress Notes (Signed)
Subjective: Maria [name] is a 87 y.o. female patient with history of diabetes who presents to office today complaining of long,mildly painful nails  while ambulating in shoes; unable to trim. Admits to pain to toes.  Patient states that the glucose reading this morning was not recorded in a few days, A1c 7.  PCP Dr. Maia Petties last visit April 2022.  Patient is assisted by daughter this visit.  Patient Active Problem List   Diagnosis Date Noted   Severe sepsis ([place]) 08/08/2020   Acute kidney injury ([place]) 08/08/2020   Acute UTI (urinary tract infection) 08/08/2020   Elevated lactic acid level 10/17/2019   Generalized weakness 10/17/2019   Hypokalemia 10/17/2019   Dehydration 10/17/2019   Acute encephalopathy 10/16/2019   Heart block AV complete ([place]) 04/13/2019   Acute acalculous cholecystitis 10/07/2016   Elevated troponin 10/07/2016   Lactic acidosis 10/07/2016   Sinus tachycardia 10/07/2016   Dyslipidemia 06/05/2013   Cardiac pacemaker 05/30/2013   DM type 2 (diabetes mellitus, type 2) ([place]) 05/21/2013   Chest pain 05/20/2013   HTN (hypertension) 05/20/2013   Current Outpatient Medications on File Prior to Visit  Medication Sig Dispense Refill   acetaminophen (TYLENOL) 500 MG tablet Take 1,000 mg by mouth in the morning and at bedtime.     amLODipine (NORVASC) 10 MG tablet Take 10 mg by mouth daily.     aspirin EC 81 MG tablet Take 81 mg by mouth daily.     bisacodyl (DULCOLAX) 10 MG suppository Place 1 suppository (10 mg total) rectally daily as needed for up to 15 doses for severe constipation. 15 suppository 0   Blood Glucose Monitoring Suppl (FREESTYLE LITE) DEVI 1 each by Other route 3 (three) times a week.   0   carboxymethylcellul-glycerin (OPTIVE) 0.5-0.9 % ophthalmic solution Place 1 drop into both eyes daily.     clotrimazole-betamethasone (LOTRISONE) cream Apply 1 application topically 2 (two) times daily as needed (rash).     cycloSPORINE (RESTASIS) 0.05 % ophthalmic  emulsion Place 1 drop into both eyes daily.      donepezil (ARICEPT) 10 MG tablet Take 10 mg by mouth at bedtime.     FREESTYLE LITE test strip 1 each by Other route 3 (three) times a week.   4   glimepiride (AMARYL) 1 MG tablet Take 1 mg by mouth in the morning and at bedtime.     Lancets (FREESTYLE) lancets 1 each by Other route 3 (three) times a week.   4   latanoprost (XALATAN) 0.005 % ophthalmic solution Place 1 drop into both eyes at bedtime.     meclizine (ANTIVERT) 25 MG tablet Take 25 mg by mouth 3 (three) times daily as needed (vertigo).      meloxicam (MOBIC) 7.5 MG tablet Take 7.5 mg by mouth daily.     mirtazapine (REMERON) 7.5 MG tablet Take 7.5 mg by mouth at bedtime.     Multiple Vitamins-Minerals (PRESERVISION AREDS 2+MULTI VIT PO) Take 1 capsule by mouth daily.     nitroGLYCERIN (NITROSTAT) 0.4 MG SL tablet Place 0.4 mg under the tongue every 5 (five) minutes as needed for chest pain.     omeprazole (PRILOSEC) 20 MG capsule Take 20 mg by mouth daily as needed (acid reflux).     ondansetron (ZOFRAN ODT) 4 MG disintegrating tablet Take 1 tablet (4 mg total) by mouth every 8 (eight) hours as needed for nausea or vomiting. 20 tablet 0   QUEtiapine (SEROQUEL) 25 MG tablet Take 25 mg  by mouth every evening.     rosuvastatin (CRESTOR) 5 MG tablet Take 5 mg by mouth daily.     valACYclovir (VALTREX) 1000 MG tablet Take 1,000 mg by mouth as needed (breakouts).     No current facility-administered medications on file prior to visit.   Allergies  Allergen Reactions   Vancomycin Itching    No results found for this or any previous visit (from the past 2160 hour(s)).   Objective: General: Patient is awake, alert, and oriented x 3 and in no acute distress.  Integument: Skin is warm, dry and supple bilateral. Nails are tender, long, thickened and  dystrophic with subungual debris, consistent with onychomycosis, 1-5 bilateral. No signs of infection. No open lesions or preulcerative  lesions present bilateral. Remaining integument unremarkable.  Vasculature:  Dorsalis Pedis pulse 1/4 bilateral. Posterior Tibial pulse 1/4 bilateral.  Capillary fill time <3 sec 1-5 bilateral. Positive hair growth to the level of the digits. Temperature gradient within normal limits. No varicosities present bilateral. No edema present bilateral.   Neurology: Subjective sharp shooting pains to toes and sensitivity   Musculoskeletal: Hammertoe pedal deformities noted bilateral. Strength within normal limits for patient age.   Assessment and Plan: Problem List Items Addressed This Visit   None Visit Diagnoses     Pain due to onychomycosis of nail    -  Primary   Diabetic polyneuropathy associated with type 2 diabetes mellitus ([place])           -Examined patient. -Discussed and educated patient on diabetic foot care, especially with  regards to the vascular, neurological and musculoskeletal systems.  -Mechanically debrided all nails 1-5 bilateral using sterile nail nipper and filed with dremel without incident  -Advised patient to try nervive nerve relief to see if this will help with her neuropathy pains as previously recommend -Gave sample of foot miracle cream for dry skin -Answered all patient questions -Patient to return  in 3 months for at risk foot care -Patient advised to call the office if any problems or questions arise in the meantime.  Landis Martins, DPM

## 2021-08-20 DIAGNOSIS — M5416 Radiculopathy, lumbar region: Secondary | ICD-10-CM | POA: Diagnosis not present

## 2021-08-20 DIAGNOSIS — M48062 Spinal stenosis, lumbar region with neurogenic claudication: Secondary | ICD-10-CM | POA: Diagnosis not present

## 2021-08-25 ENCOUNTER — Encounter (HOSPITAL_COMMUNITY): Payer: Self-pay

## 2021-09-24 DIAGNOSIS — H0102B Squamous blepharitis left eye, upper and lower eyelids: Secondary | ICD-10-CM | POA: Diagnosis not present

## 2021-09-24 DIAGNOSIS — H16223 Keratoconjunctivitis sicca, not specified as Sjogren's, bilateral: Secondary | ICD-10-CM | POA: Diagnosis not present

## 2021-09-24 DIAGNOSIS — Z961 Presence of intraocular lens: Secondary | ICD-10-CM | POA: Diagnosis not present

## 2021-09-24 DIAGNOSIS — H0102A Squamous blepharitis right eye, upper and lower eyelids: Secondary | ICD-10-CM | POA: Diagnosis not present

## 2021-09-24 DIAGNOSIS — H40023 Open angle with borderline findings, high risk, bilateral: Secondary | ICD-10-CM | POA: Diagnosis not present

## 2021-10-13 ENCOUNTER — Ambulatory Visit (INDEPENDENT_AMBULATORY_CARE_PROVIDER_SITE_OTHER): Payer: Medicare Other

## 2021-10-13 DIAGNOSIS — I442 Atrioventricular block, complete: Secondary | ICD-10-CM

## 2021-10-13 LAB — CUP PACEART REMOTE DEVICE CHECK
Battery Remaining Longevity: 108 mo
Battery Voltage: 3.01 V
Brady Statistic AP VP Percent: 17.52 %
Brady Statistic AP VS Percent: 0 %
Brady Statistic AS VP Percent: 79.16 %
Brady Statistic AS VS Percent: 3.31 %
Brady Statistic RA Percent Paced: 18.29 %
Brady Statistic RV Percent Paced: 96.59 %
Date Time Interrogation Session: 20230130221554
Implantable Lead Implant Date: 20030311
Implantable Lead Implant Date: 20030311
Implantable Lead Location: 753859
Implantable Lead Location: 753860
Implantable Lead Model: 4092
Implantable Lead Model: 4592
Implantable Pulse Generator Implant Date: 20200803
Lead Channel Impedance Value: 399 Ohm
Lead Channel Impedance Value: 456 Ohm
Lead Channel Impedance Value: 475 Ohm
Lead Channel Impedance Value: 513 Ohm
Lead Channel Pacing Threshold Amplitude: 0.375 V
Lead Channel Pacing Threshold Amplitude: 0.75 V
Lead Channel Pacing Threshold Pulse Width: 0.4 ms
Lead Channel Pacing Threshold Pulse Width: 0.4 ms
Lead Channel Sensing Intrinsic Amplitude: 16 mV
Lead Channel Sensing Intrinsic Amplitude: 16 mV
Lead Channel Sensing Intrinsic Amplitude: 2.625 mV
Lead Channel Sensing Intrinsic Amplitude: 2.625 mV
Lead Channel Setting Pacing Amplitude: 1.5 V
Lead Channel Setting Pacing Amplitude: 2.5 V
Lead Channel Setting Pacing Pulse Width: 0.4 ms
Lead Channel Setting Sensing Sensitivity: 4 mV

## 2021-10-21 NOTE — Progress Notes (Signed)
Remote pacemaker transmission.   

## 2021-10-28 DIAGNOSIS — Z20822 Contact with and (suspected) exposure to covid-19: Secondary | ICD-10-CM | POA: Diagnosis not present

## 2021-10-29 ENCOUNTER — Other Ambulatory Visit: Payer: Self-pay | Admitting: Internal Medicine

## 2021-10-29 ENCOUNTER — Ambulatory Visit
Admission: RE | Admit: 2021-10-29 | Discharge: 2021-10-29 | Disposition: A | Discharge status: Home / Self Care | Payer: Medicare Other | Source: Ambulatory Visit | Attending: Internal Medicine | Admitting: Internal Medicine

## 2021-10-29 ENCOUNTER — Other Ambulatory Visit: Payer: Self-pay

## 2021-10-29 DIAGNOSIS — E782 Mixed hyperlipidemia: Secondary | ICD-10-CM | POA: Diagnosis not present

## 2021-10-29 DIAGNOSIS — R0600 Dyspnea, unspecified: Secondary | ICD-10-CM

## 2021-10-29 DIAGNOSIS — I6529 Occlusion and stenosis of unspecified carotid artery: Secondary | ICD-10-CM | POA: Diagnosis not present

## 2021-10-29 DIAGNOSIS — I10 Essential (primary) hypertension: Secondary | ICD-10-CM | POA: Diagnosis not present

## 2021-10-29 DIAGNOSIS — R0609 Other forms of dyspnea: Secondary | ICD-10-CM | POA: Diagnosis not present

## 2021-10-29 DIAGNOSIS — E1165 Type 2 diabetes mellitus with hyperglycemia: Secondary | ICD-10-CM | POA: Diagnosis not present

## 2021-10-29 DIAGNOSIS — J9611 Chronic respiratory failure with hypoxia: Secondary | ICD-10-CM | POA: Diagnosis not present

## 2021-10-29 DIAGNOSIS — R6889 Other general symptoms and signs: Secondary | ICD-10-CM | POA: Diagnosis not present

## 2021-10-29 DIAGNOSIS — F03918 Unspecified dementia, unspecified severity, with other behavioral disturbance: Secondary | ICD-10-CM | POA: Diagnosis not present

## 2021-10-29 DIAGNOSIS — F320 Major depressive disorder, single episode, mild: Secondary | ICD-10-CM | POA: Diagnosis not present

## 2021-10-29 DIAGNOSIS — G8929 Other chronic pain: Secondary | ICD-10-CM | POA: Diagnosis not present

## 2021-11-03 DIAGNOSIS — E782 Mixed hyperlipidemia: Secondary | ICD-10-CM | POA: Diagnosis not present

## 2021-11-03 DIAGNOSIS — I10 Essential (primary) hypertension: Secondary | ICD-10-CM | POA: Diagnosis not present

## 2021-11-03 DIAGNOSIS — E1165 Type 2 diabetes mellitus with hyperglycemia: Secondary | ICD-10-CM | POA: Diagnosis not present

## 2021-11-12 ENCOUNTER — Encounter: Payer: Self-pay | Admitting: Sports Medicine

## 2021-11-12 ENCOUNTER — Ambulatory Visit (INDEPENDENT_AMBULATORY_CARE_PROVIDER_SITE_OTHER): Payer: Medicare Other | Admitting: Sports Medicine

## 2021-11-12 ENCOUNTER — Other Ambulatory Visit: Payer: Self-pay

## 2021-11-12 DIAGNOSIS — M79676 Pain in unspecified toe(s): Secondary | ICD-10-CM | POA: Diagnosis not present

## 2021-11-12 DIAGNOSIS — E1142 Type 2 diabetes mellitus with diabetic polyneuropathy: Secondary | ICD-10-CM | POA: Diagnosis not present

## 2021-11-12 DIAGNOSIS — B351 Tinea unguium: Secondary | ICD-10-CM

## 2021-11-12 DIAGNOSIS — M79609 Pain in unspecified limb: Secondary | ICD-10-CM

## 2021-11-12 NOTE — Progress Notes (Signed)
Subjective: Maria [name] is a 88 y.o. female patient with history of diabetes who presents to office today complaining of long,mildly painful nails  while ambulating in shoes; unable to trim. Patient states that the glucose reading this morning was not recorded in a few days, A1c 7.2.  PCP Dr. Maia Petties last visit 10/29/21.  Patient is assisted by daughter again this visit.  Patient Active Problem List   Diagnosis Date Noted   Severe sepsis ([place]) 08/08/2020   Acute kidney injury ([place]) 08/08/2020   Acute UTI (urinary tract infection) 08/08/2020   Elevated lactic acid level 10/17/2019   Generalized weakness 10/17/2019   Hypokalemia 10/17/2019   Dehydration 10/17/2019   Acute encephalopathy 10/16/2019   Heart block AV complete ([place]) 04/13/2019   Acute acalculous cholecystitis 10/07/2016   Elevated troponin 10/07/2016   Lactic acidosis 10/07/2016   Sinus tachycardia 10/07/2016   Dyslipidemia 06/05/2013   Cardiac pacemaker 05/30/2013   DM type 2 (diabetes mellitus, type 2) ([place]) 05/21/2013   Chest pain 05/20/2013   HTN (hypertension) 05/20/2013   Current Outpatient Medications on File Prior to Visit  Medication Sig Dispense Refill   diclofenac Sodium (VOLTAREN) 1 % GEL      fluorometholone (FML) 0.1 % ophthalmic suspension      HYDROcodone-acetaminophen (NORCO/VICODIN) 5-325 MG tablet      metFORMIN (GLUCOPHAGE) 1000 MG tablet      methylPREDNISolone (MEDROL DOSEPAK) 4 MG TBPK tablet      nitrofurantoin, macrocrystal-monohydrate, (MACROBID) 100 MG capsule      sulfamethoxazole-trimethoprim (BACTRIM DS) 800-160 MG tablet      traMADol (ULTRAM) 50 MG tablet      acetaminophen (TYLENOL) 500 MG tablet Take 1,000 mg by mouth in the morning and at bedtime.     amLODipine (NORVASC) 10 MG tablet Take 10 mg by mouth daily.     aspirin EC 81 MG tablet Take 81 mg by mouth daily.     bisacodyl (DULCOLAX) 10 MG suppository Place 1 suppository (10 mg total) rectally daily as needed for up to 15  doses for severe constipation. 15 suppository 0   Blood Glucose Monitoring Suppl (FREESTYLE LITE) DEVI 1 each by Other route 3 (three) times a week.   0   Calcium Carb-Cholecalciferol (CALTRATE 600+D3 SOFT) 600-20 MG-MCG CHEW Caltrate 600 plus D  600 mg-20 mcg (800 unit) chewable tablet  Take 1 tablet every day by oral route.     carboxymethylcellul-glycerin (OPTIVE) 0.5-0.9 % ophthalmic solution Place 1 drop into both eyes daily.     clotrimazole-betamethasone (LOTRISONE) cream Apply 1 application topically 2 (two) times daily as needed (rash).     cycloSPORINE (RESTASIS) 0.05 % ophthalmic emulsion Place 1 drop into both eyes daily.      donepezil (ARICEPT) 10 MG tablet Take 10 mg by mouth at bedtime.     erythromycin ophthalmic ointment SMARTSIG:Sparingly In Eye(s) 3 Times a Week     FREESTYLE LITE test strip 1 each by Other route 3 (three) times a week.   4   glimepiride (AMARYL) 1 MG tablet Take 1 mg by mouth in the morning and at bedtime.     Lancets (FREESTYLE) lancets 1 each by Other route 3 (three) times a week.   4   latanoprost (XALATAN) 0.005 % ophthalmic solution Place 1 drop into both eyes at bedtime.     lidocaine-prilocaine (EMLA) cream Apply topically.     losartan (COZAAR) 50 MG tablet tablet     meclizine (ANTIVERT) 25 MG tablet Take  25 mg by mouth 3 (three) times daily as needed (vertigo).      meloxicam (MOBIC) 7.5 MG tablet Take 7.5 mg by mouth daily.     meloxicam (MOBIC) 7.5 MG tablet meloxicam 7.5 mg tablet     mirtazapine (REMERON) 7.5 MG tablet Take 7.5 mg by mouth at bedtime.     Multiple Vitamins-Minerals (PRESERVISION AREDS 2+MULTI VIT PO) Take 1 capsule by mouth daily.     nitroGLYCERIN (NITROSTAT) 0.4 MG SL tablet Place 0.4 mg under the tongue every 5 (five) minutes as needed for chest pain.     omeprazole (PRILOSEC) 20 MG capsule Take 20 mg by mouth daily as needed (acid reflux).     ondansetron (ZOFRAN ODT) 4 MG disintegrating tablet Take 1 tablet (4 mg total)  by mouth every 8 (eight) hours as needed for nausea or vomiting. 20 tablet 0   predniSONE (DELTASONE) 10 MG tablet tablet     QUEtiapine (SEROQUEL) 25 MG tablet Take 25 mg by mouth every evening.     rosuvastatin (CRESTOR) 5 MG tablet Take 5 mg by mouth daily.     valACYclovir (VALTREX) 1000 MG tablet Take 1,000 mg by mouth as needed (breakouts).     No current facility-administered medications on file prior to visit.   Allergies  Allergen Reactions   Vancomycin Itching    Recent Results (from the past 2160 hour(s))  CUP PACEART REMOTE DEVICE CHECK     Status: None   Collection Time: 10/12/21 10:15 PM  Result Value Ref Range   Date Time Interrogation Session [id number]    Pulse Generator Manufacturer MERM    Pulse Gen Model [id number] Azure XT DR MRI    Pulse Gen Serial Number [id number]    Clinic Name [facility]    Implantable Pulse Generator Type Implantable Pulse Generator    Implantable Pulse Generator Implant Date [id number]    Implantable Lead Manufacturer MERM    Implantable Lead Model 4092 CapSure SP Novus    Implantable Lead Serial Number [id number]    Implantable Lead Implant Date [id number]    Implantable Lead Location [id number]    Implantable Lead Manufacturer MERM    Implantable Lead Model 4592 CapSure SP Novus    Implantable Lead Serial Number [id number]    Implantable Lead Implant Date [id number]    Implantable Lead Location [phone]    Lead Channel Setting Sensing Sensitivity 4 mV   Lead Channel Setting Pacing Amplitude 1.5 V   Lead Channel Setting Pacing Pulse Width 0.4 ms   Lead Channel Setting Pacing Amplitude 2.5 V   Lead Channel Impedance Value 513 ohm   Lead Channel Impedance Value 475 ohm   Lead Channel Sensing Intrinsic Amplitude 2.625 mV   Lead Channel Sensing Intrinsic Amplitude 2.625 mV   Lead Channel Pacing Threshold Amplitude 0.375 V   Lead Channel Pacing Threshold Pulse Width 0.4 ms   Lead Channel Impedance Value 456 ohm   Lead Channel Impedance  Value 399 ohm   Lead Channel Sensing Intrinsic Amplitude 16 mV   Lead Channel Sensing Intrinsic Amplitude 16 mV   Lead Channel Pacing Threshold Amplitude 0.75 V   Lead Channel Pacing Threshold Pulse Width 0.4 ms   Battery Status OK    Battery Remaining Longevity 108 mo   Battery Voltage 3.01 V   Brady Statistic RA Percent Paced 18.29 %   Brady Statistic RV Percent Paced 96.59 %   Brady Statistic AP VP Percent 17.52 %   Brady Statistic AS VP Percent  79.16 %   Brady Statistic AP VS Percent 0 %   Brady Statistic AS VS Percent 3.31 %     Objective: General: Patient is awake, alert, and oriented x 3 and in no acute distress.  Integument: Skin is warm, dry and supple bilateral. Nails are tender, long, thickened and dystrophic with subungual debris, consistent with onychomycosis, 1-5 bilateral. No signs of infection. No open lesions or preulcerative lesions present bilateral. Remaining integument unremarkable.  Vasculature:  Dorsalis Pedis pulse 1/4 bilateral. Posterior Tibial pulse 1/4 bilateral. Capillary fill time <3 sec 1-5 bilateral. Positive hair growth to the level of the digits. Temperature gradient within normal limits. No varicosities present bilateral. No edema present bilateral.   Neurology: Gross sensation present via light touch.  Musculoskeletal: Hammertoe pedal deformities noted bilateral. Strength within normal limits for patient age.   Assessment and Plan: Problem List Items Addressed This Visit   None Visit Diagnoses     Pain due to onychomycosis of nail    -  Primary   Relevant Medications   nitrofurantoin, macrocrystal-monohydrate, (MACROBID) 100 MG capsule   sulfamethoxazole-trimethoprim (BACTRIM DS) 800-160 MG tablet   Diabetic polyneuropathy associated with type 2 diabetes mellitus ([facility])       Relevant Medications   losartan (COZAAR) 50 MG tablet   metFORMIN (GLUCOPHAGE) 1000 MG tablet       -Examined patient. -Re-Discussed and educated patient on  diabetic foot care, especially with  regards to the vascular, neurological and musculoskeletal systems.  -Mechanically debrided all painful nails 1-5 bilateral using sterile nail nipper and filed with dremel without incident  -Patient to return  in 3 months for at risk foot care -Patient advised to call the office if any problems or questions arise in the meantime.  Landis Martins, DPM

## 2021-12-29 DIAGNOSIS — Z20822 Contact with and (suspected) exposure to covid-19: Secondary | ICD-10-CM | POA: Diagnosis not present

## 2022-01-12 ENCOUNTER — Ambulatory Visit (INDEPENDENT_AMBULATORY_CARE_PROVIDER_SITE_OTHER): Payer: Medicare Other

## 2022-01-12 DIAGNOSIS — I442 Atrioventricular block, complete: Secondary | ICD-10-CM | POA: Diagnosis not present

## 2022-01-12 LAB — CUP PACEART REMOTE DEVICE CHECK
Battery Remaining Longevity: 105 mo
Battery Voltage: 3 V
Brady Statistic AP VP Percent: 9.26 %
Brady Statistic AP VS Percent: 0 %
Brady Statistic AS VP Percent: 87 %
Brady Statistic AS VS Percent: 3.74 %
Brady Statistic RA Percent Paced: 10.34 %
Brady Statistic RV Percent Paced: 96.26 %
Date Time Interrogation Session: 20230501210724
Implantable Lead Implant Date: 20030311
Implantable Lead Implant Date: 20030311
Implantable Lead Location: 753859
Implantable Lead Location: 753860
Implantable Lead Model: 4092
Implantable Lead Model: 4592
Implantable Pulse Generator Implant Date: 20200803
Lead Channel Impedance Value: 399 Ohm
Lead Channel Impedance Value: 456 Ohm
Lead Channel Impedance Value: 494 Ohm
Lead Channel Impedance Value: 532 Ohm
Lead Channel Pacing Threshold Amplitude: 0.375 V
Lead Channel Pacing Threshold Amplitude: 0.75 V
Lead Channel Pacing Threshold Pulse Width: 0.4 ms
Lead Channel Pacing Threshold Pulse Width: 0.4 ms
Lead Channel Sensing Intrinsic Amplitude: 17.25 mV
Lead Channel Sensing Intrinsic Amplitude: 17.25 mV
Lead Channel Sensing Intrinsic Amplitude: 2.5 mV
Lead Channel Sensing Intrinsic Amplitude: 2.5 mV
Lead Channel Setting Pacing Amplitude: 1.5 V
Lead Channel Setting Pacing Amplitude: 2.5 V
Lead Channel Setting Pacing Pulse Width: 0.4 ms
Lead Channel Setting Sensing Sensitivity: 4 mV

## 2022-01-19 DIAGNOSIS — R600 Localized edema: Secondary | ICD-10-CM | POA: Diagnosis not present

## 2022-01-19 DIAGNOSIS — E782 Mixed hyperlipidemia: Secondary | ICD-10-CM | POA: Diagnosis not present

## 2022-01-19 DIAGNOSIS — R0600 Dyspnea, unspecified: Secondary | ICD-10-CM | POA: Diagnosis not present

## 2022-01-19 DIAGNOSIS — I10 Essential (primary) hypertension: Secondary | ICD-10-CM | POA: Diagnosis not present

## 2022-01-19 DIAGNOSIS — I6529 Occlusion and stenosis of unspecified carotid artery: Secondary | ICD-10-CM | POA: Diagnosis not present

## 2022-01-19 DIAGNOSIS — E1142 Type 2 diabetes mellitus with diabetic polyneuropathy: Secondary | ICD-10-CM | POA: Diagnosis not present

## 2022-01-19 DIAGNOSIS — F03918 Unspecified dementia, unspecified severity, with other behavioral disturbance: Secondary | ICD-10-CM | POA: Diagnosis not present

## 2022-01-19 DIAGNOSIS — E1165 Type 2 diabetes mellitus with hyperglycemia: Secondary | ICD-10-CM | POA: Diagnosis not present

## 2022-01-19 DIAGNOSIS — M160 Bilateral primary osteoarthritis of hip: Secondary | ICD-10-CM | POA: Diagnosis not present

## 2022-01-19 DIAGNOSIS — H6123 Impacted cerumen, bilateral: Secondary | ICD-10-CM | POA: Diagnosis not present

## 2022-01-19 DIAGNOSIS — R6889 Other general symptoms and signs: Secondary | ICD-10-CM | POA: Diagnosis not present

## 2022-01-19 DIAGNOSIS — F320 Major depressive disorder, single episode, mild: Secondary | ICD-10-CM | POA: Diagnosis not present

## 2022-01-21 NOTE — Patient Outreach (Signed)
[facility] [place] [facility]) Care Management ? ?01/21/2022 ? ?[name] ?1923/11/10 ?[id number] ? ? ?Received referral for Care Management from Insurance plan. Assigned patient to Deloria Lair, RN Care Coordinator for follow up. ? ?Philmore Pali ?[place] Management Assistant ?[phone] ? ?

## 2022-01-27 DIAGNOSIS — M48062 Spinal stenosis, lumbar region with neurogenic claudication: Secondary | ICD-10-CM | POA: Diagnosis not present

## 2022-01-27 NOTE — Progress Notes (Signed)
Remote pacemaker transmission.   

## 2022-02-11 DIAGNOSIS — M5416 Radiculopathy, lumbar region: Secondary | ICD-10-CM | POA: Diagnosis not present

## 2022-02-16 ENCOUNTER — Ambulatory Visit (INDEPENDENT_AMBULATORY_CARE_PROVIDER_SITE_OTHER): Payer: Medicare Other | Admitting: Podiatry

## 2022-02-16 ENCOUNTER — Encounter: Payer: Self-pay | Admitting: Podiatry

## 2022-02-16 DIAGNOSIS — E1165 Type 2 diabetes mellitus with hyperglycemia: Secondary | ICD-10-CM | POA: Diagnosis not present

## 2022-02-16 DIAGNOSIS — Z9181 History of falling: Secondary | ICD-10-CM | POA: Diagnosis not present

## 2022-02-16 DIAGNOSIS — M79609 Pain in unspecified limb: Secondary | ICD-10-CM | POA: Diagnosis not present

## 2022-02-16 DIAGNOSIS — B351 Tinea unguium: Secondary | ICD-10-CM

## 2022-02-16 DIAGNOSIS — I10 Essential (primary) hypertension: Secondary | ICD-10-CM | POA: Diagnosis not present

## 2022-02-21 NOTE — Progress Notes (Signed)
  Subjective:  Patient ID: Maria [name], female    DOB: 04-03-1924,  MRN: [id number]  Maria [name] presents to clinic today for at risk foot care with history of diabetic neuropathy and painful elongated mycotic toenails 1-5 bilaterally which are tender when wearing enclosed shoe gear. Pain is relieved with periodic professional debridement.  Last known HgA1c was about 7%. Patient did not check blood glucose today.  New problem(s): None.   PCP is Audley Hose, MD , and last visit was Jan 19, 2022.  Patient is a patient of Dr. Leeanne Rio.  Allergies  Allergen Reactions   Vancomycin Itching   Review of Systems: Negative except as noted in the HPI.  Objective: No changes noted in today's physical examination.  Vascular Examination: Vascular status: Faintly palpable pedal pulses. Pedal hair present b/l. CFT <3 seconds. No edema. No pain with calf compression b/l. Skin temperature gradient WNL b/l.   Neurological Examination: Sensation grossly intact b/l with 10 gram monofilament. Vibratory sensation intact b/l.   Dermatological Examination: Pedal skin with normal turgor, texture and tone b/l. Toenails 1-5 b/l thick, discolored, elongated with subungual debris and pain on dorsal palpation. No hyperkeratotic lesions noted b/l.   Musculoskeletal Examination: Muscle strength 4/5 to all lower extremity muscle groups bilaterally. Hammertoe deformity noted 2-5 b/l.  Radiographs: None  Assessment/Plan: 1. Pain due to onychomycosis of nail     -Patient was evaluated and treated. All patient's and/or POA's questions/concerns answered on today's visit. -Patient to continue soft, supportive shoe gear daily. -Mycotic toenails 1-5 bilaterally were debrided in length and girth with sterile nail nippers and dremel without incident. -Patient/POA to call should there be question/concern in the interim.   Return in about 3 months (around 05/19/2022).  Marzetta Board, DPM

## 2022-03-10 ENCOUNTER — Other Ambulatory Visit: Payer: Self-pay | Admitting: *Deleted

## 2022-03-10 NOTE — Patient Outreach (Signed)
[place] [facility]) Care Management  03/10/2022  JASMYNN PFALZGRAF [birth year] [id number]  Initial telephone outreach for [place] management services. Talked with pt's daughter, Gwenlyn Saran, who is pt's caregiver. She reports her mother is HOH and usually does not answer the phone. Provided program information and advised will send information. She may call me if she feel they could benefit from the services. It is likely that Mrs. [place] would NOT be able to participate actively.  Eulah Pont. Myrtie Neither, MSN, [facility] Gerontological Nurse Practitioner [facility] Care Management [phone]

## 2022-04-13 ENCOUNTER — Ambulatory Visit (INDEPENDENT_AMBULATORY_CARE_PROVIDER_SITE_OTHER): Payer: Medicare Other

## 2022-04-13 DIAGNOSIS — I442 Atrioventricular block, complete: Secondary | ICD-10-CM

## 2022-04-13 LAB — CUP PACEART REMOTE DEVICE CHECK
Battery Remaining Longevity: 100 mo
Battery Voltage: 3 V
Brady Statistic AP VP Percent: 6.69 %
Brady Statistic AP VS Percent: 0 %
Brady Statistic AS VP Percent: 90.91 %
Brady Statistic AS VS Percent: 2.4 %
Brady Statistic RA Percent Paced: 7.43 %
Brady Statistic RV Percent Paced: 97.6 %
Date Time Interrogation Session: 20230801024402
Implantable Lead Implant Date: 20030311
Implantable Lead Implant Date: 20030311
Implantable Lead Location: 753859
Implantable Lead Location: 753860
Implantable Lead Model: 4092
Implantable Lead Model: 4592
Implantable Pulse Generator Implant Date: 20200803
Lead Channel Impedance Value: 380 Ohm
Lead Channel Impedance Value: 437 Ohm
Lead Channel Impedance Value: 456 Ohm
Lead Channel Impedance Value: 494 Ohm
Lead Channel Pacing Threshold Amplitude: 0.375 V
Lead Channel Pacing Threshold Amplitude: 0.625 V
Lead Channel Pacing Threshold Pulse Width: 0.4 ms
Lead Channel Pacing Threshold Pulse Width: 0.4 ms
Lead Channel Sensing Intrinsic Amplitude: 1.75 mV
Lead Channel Sensing Intrinsic Amplitude: 1.75 mV
Lead Channel Sensing Intrinsic Amplitude: 15 mV
Lead Channel Sensing Intrinsic Amplitude: 15 mV
Lead Channel Setting Pacing Amplitude: 1.5 V
Lead Channel Setting Pacing Amplitude: 2.5 V
Lead Channel Setting Pacing Pulse Width: 0.4 ms
Lead Channel Setting Sensing Sensitivity: 4 mV

## 2022-05-04 DIAGNOSIS — E782 Mixed hyperlipidemia: Secondary | ICD-10-CM | POA: Diagnosis not present

## 2022-05-04 DIAGNOSIS — Z1211 Encounter for screening for malignant neoplasm of colon: Secondary | ICD-10-CM | POA: Diagnosis not present

## 2022-05-04 DIAGNOSIS — Z1231 Encounter for screening mammogram for malignant neoplasm of breast: Secondary | ICD-10-CM | POA: Diagnosis not present

## 2022-05-04 DIAGNOSIS — E1165 Type 2 diabetes mellitus with hyperglycemia: Secondary | ICD-10-CM | POA: Diagnosis not present

## 2022-05-04 DIAGNOSIS — I739 Peripheral vascular disease, unspecified: Secondary | ICD-10-CM | POA: Diagnosis not present

## 2022-05-04 DIAGNOSIS — M160 Bilateral primary osteoarthritis of hip: Secondary | ICD-10-CM | POA: Diagnosis not present

## 2022-05-04 DIAGNOSIS — Z23 Encounter for immunization: Secondary | ICD-10-CM | POA: Diagnosis not present

## 2022-05-04 DIAGNOSIS — Z0001 Encounter for general adult medical examination with abnormal findings: Secondary | ICD-10-CM | POA: Diagnosis not present

## 2022-05-04 DIAGNOSIS — Z13820 Encounter for screening for osteoporosis: Secondary | ICD-10-CM | POA: Diagnosis not present

## 2022-05-04 DIAGNOSIS — F320 Major depressive disorder, single episode, mild: Secondary | ICD-10-CM | POA: Diagnosis not present

## 2022-05-04 DIAGNOSIS — I10 Essential (primary) hypertension: Secondary | ICD-10-CM | POA: Diagnosis not present

## 2022-05-04 DIAGNOSIS — F03911 Unspecified dementia, unspecified severity, with agitation: Secondary | ICD-10-CM | POA: Diagnosis not present

## 2022-05-11 DIAGNOSIS — R809 Proteinuria, unspecified: Secondary | ICD-10-CM | POA: Diagnosis not present

## 2022-05-11 DIAGNOSIS — R8290 Unspecified abnormal findings in urine: Secondary | ICD-10-CM | POA: Diagnosis not present

## 2022-05-12 NOTE — Progress Notes (Signed)
Remote pacemaker transmission.   

## 2022-06-01 ENCOUNTER — Ambulatory Visit: Payer: Medicare Other | Admitting: Podiatry

## 2022-06-02 ENCOUNTER — Other Ambulatory Visit: Payer: Self-pay

## 2022-06-02 ENCOUNTER — Encounter (HOSPITAL_COMMUNITY): Payer: Self-pay | Admitting: Emergency Medicine

## 2022-06-02 ENCOUNTER — Observation Stay (HOSPITAL_COMMUNITY): Payer: Medicare Other

## 2022-06-02 ENCOUNTER — Inpatient Hospital Stay (HOSPITAL_COMMUNITY)
Admission: EM | Admit: 2022-06-02 | Discharge: 2022-06-07 | DRG: 193 | Disposition: A | Discharge status: Skilled Nursing Facility | Payer: Medicare Other | Attending: Internal Medicine | Admitting: Internal Medicine

## 2022-06-02 DIAGNOSIS — F02B2 Dementia in other diseases classified elsewhere, moderate, with psychotic disturbance: Secondary | ICD-10-CM | POA: Diagnosis not present

## 2022-06-02 DIAGNOSIS — G309 Alzheimer's disease, unspecified: Secondary | ICD-10-CM | POA: Diagnosis present

## 2022-06-02 DIAGNOSIS — Z8673 Personal history of transient ischemic attack (TIA), and cerebral infarction without residual deficits: Secondary | ICD-10-CM

## 2022-06-02 DIAGNOSIS — M160 Bilateral primary osteoarthritis of hip: Secondary | ICD-10-CM | POA: Diagnosis not present

## 2022-06-02 DIAGNOSIS — M6281 Muscle weakness (generalized): Secondary | ICD-10-CM | POA: Diagnosis not present

## 2022-06-02 DIAGNOSIS — I110 Hypertensive heart disease with heart failure: Secondary | ICD-10-CM | POA: Diagnosis not present

## 2022-06-02 DIAGNOSIS — F02818 Dementia in other diseases classified elsewhere, unspecified severity, with other behavioral disturbance: Secondary | ICD-10-CM | POA: Diagnosis present

## 2022-06-02 DIAGNOSIS — F064 Anxiety disorder due to known physiological condition: Secondary | ICD-10-CM | POA: Diagnosis not present

## 2022-06-02 DIAGNOSIS — H3530 Unspecified macular degeneration: Secondary | ICD-10-CM | POA: Diagnosis present

## 2022-06-02 DIAGNOSIS — K219 Gastro-esophageal reflux disease without esophagitis: Secondary | ICD-10-CM | POA: Diagnosis not present

## 2022-06-02 DIAGNOSIS — J189 Pneumonia, unspecified organism: Secondary | ICD-10-CM | POA: Diagnosis not present

## 2022-06-02 DIAGNOSIS — Z853 Personal history of malignant neoplasm of breast: Secondary | ICD-10-CM

## 2022-06-02 DIAGNOSIS — E1142 Type 2 diabetes mellitus with diabetic polyneuropathy: Secondary | ICD-10-CM | POA: Diagnosis not present

## 2022-06-02 DIAGNOSIS — R442 Other hallucinations: Secondary | ICD-10-CM | POA: Diagnosis not present

## 2022-06-02 DIAGNOSIS — J168 Pneumonia due to other specified infectious organisms: Secondary | ICD-10-CM | POA: Diagnosis not present

## 2022-06-02 DIAGNOSIS — F05 Delirium due to known physiological condition: Secondary | ICD-10-CM | POA: Diagnosis not present

## 2022-06-02 DIAGNOSIS — E876 Hypokalemia: Secondary | ICD-10-CM | POA: Diagnosis present

## 2022-06-02 DIAGNOSIS — D649 Anemia, unspecified: Secondary | ICD-10-CM | POA: Diagnosis not present

## 2022-06-02 DIAGNOSIS — E1169 Type 2 diabetes mellitus with other specified complication: Secondary | ICD-10-CM | POA: Diagnosis present

## 2022-06-02 DIAGNOSIS — Z66 Do not resuscitate: Secondary | ICD-10-CM | POA: Diagnosis present

## 2022-06-02 DIAGNOSIS — I272 Pulmonary hypertension, unspecified: Secondary | ICD-10-CM | POA: Diagnosis present

## 2022-06-02 DIAGNOSIS — R410 Disorientation, unspecified: Secondary | ICD-10-CM | POA: Diagnosis not present

## 2022-06-02 DIAGNOSIS — G9341 Metabolic encephalopathy: Secondary | ICD-10-CM | POA: Diagnosis present

## 2022-06-02 DIAGNOSIS — Z881 Allergy status to other antibiotic agents status: Secondary | ICD-10-CM

## 2022-06-02 DIAGNOSIS — Z1152 Encounter for screening for COVID-19: Secondary | ICD-10-CM | POA: Diagnosis not present

## 2022-06-02 DIAGNOSIS — Z7984 Long term (current) use of oral hypoglycemic drugs: Secondary | ICD-10-CM

## 2022-06-02 DIAGNOSIS — R110 Nausea: Secondary | ICD-10-CM | POA: Diagnosis not present

## 2022-06-02 DIAGNOSIS — I442 Atrioventricular block, complete: Secondary | ICD-10-CM | POA: Diagnosis not present

## 2022-06-02 DIAGNOSIS — M19011 Primary osteoarthritis, right shoulder: Secondary | ICD-10-CM | POA: Diagnosis not present

## 2022-06-02 DIAGNOSIS — Z7982 Long term (current) use of aspirin: Secondary | ICD-10-CM

## 2022-06-02 DIAGNOSIS — E782 Mixed hyperlipidemia: Secondary | ICD-10-CM | POA: Diagnosis present

## 2022-06-02 DIAGNOSIS — I5032 Chronic diastolic (congestive) heart failure: Secondary | ICD-10-CM | POA: Diagnosis not present

## 2022-06-02 DIAGNOSIS — Z87440 Personal history of urinary (tract) infections: Secondary | ICD-10-CM

## 2022-06-02 DIAGNOSIS — A539 Syphilis, unspecified: Secondary | ICD-10-CM | POA: Diagnosis not present

## 2022-06-02 DIAGNOSIS — R42 Dizziness and giddiness: Secondary | ICD-10-CM | POA: Diagnosis not present

## 2022-06-02 DIAGNOSIS — Z950 Presence of cardiac pacemaker: Secondary | ICD-10-CM

## 2022-06-02 DIAGNOSIS — Z96653 Presence of artificial knee joint, bilateral: Secondary | ICD-10-CM | POA: Diagnosis present

## 2022-06-02 DIAGNOSIS — J986 Disorders of diaphragm: Secondary | ICD-10-CM | POA: Diagnosis not present

## 2022-06-02 DIAGNOSIS — I10 Essential (primary) hypertension: Secondary | ICD-10-CM | POA: Diagnosis not present

## 2022-06-02 DIAGNOSIS — R9431 Abnormal electrocardiogram [ECG] [EKG]: Secondary | ICD-10-CM

## 2022-06-02 DIAGNOSIS — G928 Other toxic encephalopathy: Secondary | ICD-10-CM | POA: Diagnosis present

## 2022-06-02 DIAGNOSIS — F02B18 Dementia in other diseases classified elsewhere, moderate, with other behavioral disturbance: Secondary | ICD-10-CM | POA: Diagnosis present

## 2022-06-02 DIAGNOSIS — R4182 Altered mental status, unspecified: Secondary | ICD-10-CM | POA: Diagnosis not present

## 2022-06-02 DIAGNOSIS — H353130 Nonexudative age-related macular degeneration, bilateral, stage unspecified: Secondary | ICD-10-CM | POA: Diagnosis not present

## 2022-06-02 DIAGNOSIS — M5136 Other intervertebral disc degeneration, lumbar region: Secondary | ICD-10-CM | POA: Diagnosis not present

## 2022-06-02 DIAGNOSIS — H9193 Unspecified hearing loss, bilateral: Secondary | ICD-10-CM | POA: Diagnosis not present

## 2022-06-02 DIAGNOSIS — R1111 Vomiting without nausea: Secondary | ICD-10-CM | POA: Diagnosis not present

## 2022-06-02 DIAGNOSIS — Z79899 Other long term (current) drug therapy: Secondary | ICD-10-CM

## 2022-06-02 DIAGNOSIS — R739 Hyperglycemia, unspecified: Secondary | ICD-10-CM | POA: Diagnosis not present

## 2022-06-02 DIAGNOSIS — H814 Vertigo of central origin: Secondary | ICD-10-CM | POA: Diagnosis not present

## 2022-06-02 DIAGNOSIS — Z7401 Bed confinement status: Secondary | ICD-10-CM | POA: Diagnosis not present

## 2022-06-02 DIAGNOSIS — M19012 Primary osteoarthritis, left shoulder: Secondary | ICD-10-CM | POA: Diagnosis not present

## 2022-06-02 DIAGNOSIS — G308 Other Alzheimer's disease: Secondary | ICD-10-CM | POA: Diagnosis not present

## 2022-06-02 DIAGNOSIS — N3000 Acute cystitis without hematuria: Secondary | ICD-10-CM | POA: Diagnosis not present

## 2022-06-02 DIAGNOSIS — G9340 Encephalopathy, unspecified: Secondary | ICD-10-CM | POA: Diagnosis present

## 2022-06-02 LAB — COMPREHENSIVE METABOLIC PANEL
ALT: 24 U/L (ref 0–44)
AST: 35 U/L (ref 15–41)
Albumin: 4.4 g/dL (ref 3.5–5.0)
Alkaline Phosphatase: 34 U/L — ABNORMAL LOW (ref 38–126)
Anion gap: 15 (ref 5–15)
BUN: 15 mg/dL (ref 8–23)
CO2: 22 mmol/L (ref 22–32)
Calcium: 10.1 mg/dL (ref 8.9–10.3)
Chloride: 103 mmol/L (ref 98–111)
Creatinine, Ser: 0.86 mg/dL (ref 0.44–1.00)
GFR, Estimated: >60 mL/min (ref >60)
Glucose, Bld: 118 mg/dL — ABNORMAL HIGH (ref 70–99)
Potassium: 4.7 mmol/L (ref 3.5–5.1)
Sodium: 140 mmol/L (ref 135–145)
Total Bilirubin: 1.1 mg/dL (ref 0.3–1.2)
Total Protein: 7.7 g/dL (ref 6.5–8.1)

## 2022-06-02 LAB — URINALYSIS, ROUTINE W REFLEX MICROSCOPIC
Bilirubin Urine: NEGATIVE
Glucose, UA: NEGATIVE mg/dL
Hgb urine dipstick: NEGATIVE
Ketones, ur: 5 mg/dL — AB
Nitrite: NEGATIVE
Protein, ur: 100 mg/dL — AB
Specific Gravity, Urine: 1.017 (ref 1.005–1.030)
pH: 5 (ref 5.0–8.0)

## 2022-06-02 LAB — CBC
HCT: 45.1 % (ref 36.0–46.0)
Hemoglobin: 14.7 g/dL (ref 12.0–15.0)
MCH: 28.5 pg (ref 26.0–34.0)
MCHC: 32.6 g/dL (ref 30.0–36.0)
MCV: 87.4 fL (ref 80.0–100.0)
Platelets: 248 10*3/uL (ref 150–400)
RBC: 5.16 MIL/uL — ABNORMAL HIGH (ref 3.87–5.11)
RDW: 13 % (ref 11.5–15.5)
WBC: 8.4 10*3/uL (ref 4.0–10.5)
nRBC: 0 % (ref 0.0–0.2)

## 2022-06-02 LAB — RAPID URINE DRUG SCREEN, HOSP PERFORMED
Amphetamines: NOT DETECTED
Barbiturates: NOT DETECTED
Benzodiazepines: NOT DETECTED
Cocaine: NOT DETECTED
Opiates: NOT DETECTED
Tetrahydrocannabinol: NOT DETECTED

## 2022-06-02 LAB — AMMONIA: Ammonia: 17 umol/L (ref 9–35)

## 2022-06-02 LAB — ACETAMINOPHEN LEVEL: Acetaminophen (Tylenol), Serum: <10 ug/mL — ABNORMAL LOW (ref 10–30)

## 2022-06-02 LAB — ETHANOL: Alcohol, Ethyl (B): <10 mg/dL (ref <10)

## 2022-06-02 LAB — SALICYLATE LEVEL: Salicylate Lvl: <7 mg/dL — ABNORMAL LOW (ref 7.0–30.0)

## 2022-06-02 MED ORDER — HYDRALAZINE HCL 20 MG/ML IJ SOLN
10.0000 mg | Freq: Four times a day (QID) | INTRAMUSCULAR | Status: DC | PRN
  Administered 2022-06-03: 10 mg via INTRAVENOUS
  Filled 2022-06-02: qty 1

## 2022-06-02 MED ORDER — POLYVINYL ALCOHOL 1.4 % OP SOLN
1.0000 [drp] | Freq: Every day | OPHTHALMIC | Status: DC
  Administered 2022-06-03 – 2022-06-07 (×5): 1 [drp] via OPHTHALMIC
  Filled 2022-06-02: qty 15

## 2022-06-02 MED ORDER — CYCLOSPORINE 0.05 % OP EMUL
1.0000 [drp] | Freq: Every day | OPHTHALMIC | Status: DC
  Administered 2022-06-03 – 2022-06-07 (×5): 1 [drp] via OPHTHALMIC
  Filled 2022-06-02 (×5): qty 30

## 2022-06-02 MED ORDER — POLYETHYLENE GLYCOL 3350 17 G PO PACK: 17.0000 g | PACK | Freq: Every day | ORAL | Status: DC | PRN

## 2022-06-02 MED ORDER — PANTOPRAZOLE SODIUM 40 MG PO TBEC
40.0000 mg | DELAYED_RELEASE_TABLET | Freq: Every day | ORAL | Status: DC
  Administered 2022-06-03 – 2022-06-07 (×5): 40 mg via ORAL
  Filled 2022-06-02 (×5): qty 1

## 2022-06-02 MED ORDER — INSULIN ASPART 100 UNIT/ML IJ SOLN
0.0000 [IU] | Freq: Three times a day (TID) | INTRAMUSCULAR | Status: DC
  Administered 2022-06-03: 2 [IU] via SUBCUTANEOUS
  Administered 2022-06-03: 3 [IU] via SUBCUTANEOUS
  Administered 2022-06-04: 2 [IU] via SUBCUTANEOUS
  Administered 2022-06-04: 3 [IU] via SUBCUTANEOUS
  Administered 2022-06-04: 2 [IU] via SUBCUTANEOUS
  Administered 2022-06-05: 3 [IU] via SUBCUTANEOUS
  Administered 2022-06-05: 5 [IU] via SUBCUTANEOUS
  Administered 2022-06-05 – 2022-06-06 (×2): 3 [IU] via SUBCUTANEOUS
  Administered 2022-06-06 (×2): 2 [IU] via SUBCUTANEOUS

## 2022-06-02 MED ORDER — ACETAMINOPHEN 650 MG RE SUPP: 650.0000 mg | Freq: Four times a day (QID) | RECTAL | Status: DC | PRN

## 2022-06-02 MED ORDER — MIRTAZAPINE 15 MG PO TABS: 7.5000 mg | ORAL_TABLET | Freq: Every day | ORAL | Status: DC

## 2022-06-02 MED ORDER — ENOXAPARIN SODIUM 30 MG/0.3ML IJ SOSY
30.0000 mg | PREFILLED_SYRINGE | INTRAMUSCULAR | Status: DC
  Administered 2022-06-03: 30 mg via SUBCUTANEOUS
  Filled 2022-06-02: qty 0.3

## 2022-06-02 MED ORDER — ONDANSETRON HCL 4 MG PO TABS: 4.0000 mg | ORAL_TABLET | Freq: Four times a day (QID) | ORAL | Status: DC | PRN

## 2022-06-02 MED ORDER — SODIUM CHLORIDE 0.9% FLUSH
3.0000 mL | Freq: Two times a day (BID) | INTRAVENOUS | Status: DC
  Administered 2022-06-03 – 2022-06-06 (×4): 3 mL via INTRAVENOUS

## 2022-06-02 MED ORDER — ACETAMINOPHEN 325 MG PO TABS: 650.0000 mg | ORAL_TABLET | Freq: Four times a day (QID) | ORAL | Status: DC | PRN

## 2022-06-02 MED ORDER — SODIUM CHLORIDE 0.9 % IV SOLN
1.0000 g | Freq: Once | INTRAVENOUS | Status: AC
  Administered 2022-06-02: 1 g via INTRAVENOUS
  Filled 2022-06-02: qty 10

## 2022-06-02 MED ORDER — ONDANSETRON HCL 4 MG/2ML IJ SOLN: 4.0000 mg | Freq: Four times a day (QID) | INTRAMUSCULAR | Status: DC | PRN

## 2022-06-02 MED ORDER — LOSARTAN POTASSIUM 50 MG PO TABS
50.0000 mg | ORAL_TABLET | Freq: Every day | ORAL | Status: DC
  Administered 2022-06-03 – 2022-06-07 (×5): 50 mg via ORAL
  Filled 2022-06-02 (×5): qty 1

## 2022-06-02 MED ORDER — LATANOPROST 0.005 % OP SOLN
1.0000 [drp] | Freq: Every day | OPHTHALMIC | Status: DC
  Administered 2022-06-03 – 2022-06-06 (×4): 1 [drp] via OPHTHALMIC
  Filled 2022-06-02: qty 2.5

## 2022-06-02 MED ORDER — DONEPEZIL HCL 10 MG PO TABS
10.0000 mg | ORAL_TABLET | Freq: Every day | ORAL | Status: DC
  Filled 2022-06-02: qty 1

## 2022-06-02 MED ORDER — MELOXICAM 7.5 MG PO TABS: 7.5000 mg | ORAL_TABLET | Freq: Every day | ORAL | Status: DC | PRN

## 2022-06-02 MED ORDER — ASPIRIN 81 MG PO TBEC
81.0000 mg | DELAYED_RELEASE_TABLET | Freq: Every day | ORAL | Status: DC
  Administered 2022-06-03 – 2022-06-07 (×5): 81 mg via ORAL
  Filled 2022-06-02 (×5): qty 1

## 2022-06-02 MED ORDER — ROSUVASTATIN CALCIUM 5 MG PO TABS
5.0000 mg | ORAL_TABLET | Freq: Every day | ORAL | Status: DC
  Administered 2022-06-03 – 2022-06-07 (×5): 5 mg via ORAL
  Filled 2022-06-02 (×5): qty 1

## 2022-06-02 MED ORDER — SODIUM CHLORIDE 0.9 % IV SOLN
1.0000 g | INTRAVENOUS | Status: DC
  Administered 2022-06-03 – 2022-06-06 (×4): 1 g via INTRAVENOUS
  Filled 2022-06-02 (×4): qty 10

## 2022-06-02 NOTE — H&P (Signed)
History and Physical    Patient: Maria [name] MRN: [id number] DOA: 06/02/2022  Date of Service: the patient was seen and examined on 06/03/2022  Patient coming from: Home  Chief Complaint:  Chief Complaint  Patient presents with   Hallucinations    HPI:   88 year old female with past medical history of Alzheimer Dementia, complete heart block (S/P PPM placement 2003 with generator change 10/4233), diastolic congestive heart failure (Echo 09/2016 EF 50-55% with G1DD),  non-insulin-dependent diabetes mellitus type 2, hyperlipidemia, hypertension, prior stroke (10/2019), breast cancer (2015) who presents to [facility] emergency department with family reports of confusion and intermittent hallucinations.  Patient is an extremely poor historian due to presenting with confusion.  The majority the history has been obtained from discussions with the emergency department staff as well as information obtained from the family.  For approximately the past 1 week the patient has been experiencing increasing lethargy with bouts of confusion.  As patient's confusion has worsened, patient also seems to have become increasingly weak and lethargic with worsening oral intake.  Over the same span of time patient is also been experiencing associated visual hallucinations, stating that she has been seeing "little people run around."  Family reports no evidence of fever, nausea, vomiting, cough, shortness of breath, diarrhea or complaints of abdominal pain.  Due to progressively worsening symptoms the patient was eventually brought into [facility] emergency department for evaluation.  Of note, daughter reports that at baseline patient has mild/moderate cognitive deficit and occasional hallucinations.  Over the past week patient's symptoms have dramatically worsened compared to her baseline.  Upon evaluation in the emergency department initial work-up revealed a urinalysis that the ED provider was  concerned for urinary tract infection.  The patient was given a dose of intravenous ceftriaxone and the hospitalist group has now been called to assess the patient for admission to the hospital.  Review of Systems: Review of Systems  Unable to perform ROS: Mental status change     Past Medical History:  Diagnosis Date   Anxiety    Arthritis    Cancer ([place])    breast 2015   Cardiac pacemaker in situ    Chest pain    Dyslipidemia    Dyspnea    with exertion   Family history of adverse reaction to anesthesia    daughter slow to wake up   Gallstones    Headache    Hypertension    Macular degeneration    Pulmonary hypertension ([place])    PA peak pressure 81 mmHg by 09/2016 echo   Type II or unspecified type diabetes mellitus without mention of complication, not stated as uncontrolled    Wears partial dentures     Past Surgical History:  Procedure Laterality Date   BREAST EXCISIONAL BIOPSY Right 2010   CATARACT EXTRACTION     CHOLECYSTECTOMY N/A 01/18/2017   Procedure: LAPAROSCOPIC CHOLECYSTECTOMY;  Surgeon: Ralene Ok, MD;  Location: [place];  Service: General;  Laterality: N/A;   IR GENERIC HISTORICAL  10/08/2016   IR PERC CHOLECYSTOSTOMY 10/08/2016 Greggory Keen, MD [facility]-INTERV RAD   IR RADIOLOGIST EVAL & MGMT  11/16/2016   KNEE SURGERY Bilateral    MULTIPLE TOOTH EXTRACTIONS     PACEMAKER INSERTION     [employer], [facility] 2003   PACEMAKER INSERTION     PPM GENERATOR CHANGEOUT N/A 04/16/2019   Procedure: PPM GENERATOR CHANGEOUT;  Surgeon: Evans Lance, MD;  Location: [place] [facility];  Service: Cardiovascular;  Laterality: N/A;   TOTAL KNEE ARTHROPLASTY Bilateral     Social History:  reports that she has never smoked. Her smokeless tobacco use includes snuff. She reports that she does not drink alcohol and does not use drugs.  Allergies  Allergen Reactions   Vancomycin Itching    Family History  Problem Relation Age of Onset   Colon cancer Neg Hx     Prior to  Admission medications   Medication Sig Start Date End Date Taking? Authorizing Provider  acetaminophen (TYLENOL) 650 MG CR tablet Take 1,300 mg by mouth in the morning and at bedtime.   Yes [provider]  aspirin EC 81 MG tablet Take 81 mg by mouth daily.   Yes [provider]  cycloSPORINE (RESTASIS) 0.05 % ophthalmic emulsion Place 1 drop into both eyes daily.    Yes [provider]  donepezil (ARICEPT) 10 MG tablet Take 10 mg by mouth at bedtime.   Yes [provider]  glimepiride (AMARYL) 1 MG tablet Take 1 mg by mouth in the morning and at bedtime. 10/23/19  Yes [provider]  losartan (COZAAR) 50 MG tablet Take 50 mg by mouth daily. 05/26/21  Yes [provider]  meclizine (ANTIVERT) 25 MG tablet Take 25 mg by mouth 3 (three) times daily as needed (vertigo).    Yes [provider]  meloxicam (MOBIC) 7.5 MG tablet Take 7.5 mg by mouth daily as needed (for arthritis pain).   Yes [provider]  metFORMIN (GLUCOPHAGE) 1000 MG tablet Take 1,000 mg by mouth daily with breakfast. 01/08/21  Yes [provider]  QUEtiapine (SEROQUEL) 25 MG tablet Take 25 mg by mouth daily as needed (for hallucinations). 10/09/20  Yes [provider]  REFRESH OPTIVE PF 0.5-0.9 % SOLN Place 1 drop into both eyes 2 (two) times daily as needed.   Yes [provider]  rosuvastatin (CRESTOR) 5 MG tablet Take 5 mg by mouth daily. 11/19/19  Yes [provider]  valACYclovir (VALTREX) 1000 MG tablet Take 1,000 mg by mouth as needed (breakouts).   Yes [provider]  acetaminophen (TYLENOL) 500 MG tablet Take 1,000 mg by mouth in the morning and at bedtime.    [provider]  bisacodyl (DULCOLAX) 10 MG suppository Place 1 suppository (10 mg total) rectally daily as needed for up to 15 doses for severe constipation. 04/29/21   Wyvonnia Dusky, MD  Blood Glucose Monitoring Suppl (FREESTYLE LITE) DEVI 1  each by Other route 3 (three) times a week.  08/28/14   [provider]  Calcium Carb-Cholecalciferol (CALTRATE 600+D3 SOFT) 600-20 MG-MCG CHEW Caltrate 600 plus D  600 mg-20 mcg (800 unit) chewable tablet  Take 1 tablet every day by oral route.    [provider]  carboxymethylcellul-glycerin (OPTIVE) 0.5-0.9 % ophthalmic solution Place 1 drop into both eyes daily.    [provider]  clotrimazole-betamethasone (LOTRISONE) cream Apply 1 application topically 2 (two) times daily as needed (rash). 12/15/20   [provider]  diclofenac Sodium (VOLTAREN) 1 % GEL  12/21/20   [provider]  erythromycin ophthalmic ointment SMARTSIG:Sparingly In Eye(s) 3 Times a Week 05/26/21   [provider]  fluorometholone (FML) 0.1 % ophthalmic suspension  04/27/21   [provider]  FREESTYLE LITE test strip 1 each by Other route 3 (three) times a week.  08/28/14   [provider]  HYDROcodone-acetaminophen (NORCO/VICODIN) 5-325 MG tablet  04/22/21   [provider]  Lancets (FREESTYLE) lancets 1 each by Other route 3 (three) times a week.  08/28/14   [provider]  latanoprost (XALATAN) 0.005 % ophthalmic solution Place 1 drop into both eyes at bedtime. 06/10/20   [provider]  lidocaine-prilocaine (EMLA) cream Apply topically. 05/26/21   [provider]  meloxicam (MOBIC) 7.5 MG tablet meloxicam 7.5 mg tablet    [provider]  methylPREDNISolone (MEDROL DOSEPAK) 4 MG TBPK tablet  04/22/21   [provider]  mirtazapine (REMERON) 7.5 MG tablet Take 7.5 mg by mouth at bedtime. 11/03/20   [provider]  Multiple Vitamins-Minerals (PRESERVISION AREDS 2+MULTI VIT PO) Take 1 capsule by mouth daily.    [provider]  nitrofurantoin, macrocrystal-monohydrate, (MACROBID) 100 MG capsule  02/17/21   [provider]  nitroGLYCERIN (NITROSTAT) 0.4 MG SL tablet Place 0.4 mg  under the tongue every 5 (five) minutes as needed for chest pain. 06/10/20   [provider]  omeprazole (PRILOSEC) 20 MG capsule Take 20 mg by mouth daily as needed (acid reflux). 11/03/20   [provider]  ondansetron (ZOFRAN ODT) 4 MG disintegrating tablet Take 1 tablet (4 mg total) by mouth every 8 (eight) hours as needed for nausea or vomiting. Patient not taking: Reported on 06/02/2022 12/19/18   Arville Lime, PA-C  predniSONE (DELTASONE) 10 MG tablet tablet 05/26/21   [provider]  sulfamethoxazole-trimethoprim (BACTRIM DS) 800-160 MG tablet  03/11/21   [provider]  traMADol Veatrice Bourbon) 50 MG tablet  08/20/20   [provider]    Physical Exam:  Vitals:   06/02/22 2100 06/02/22 2159 06/02/22 2245 06/02/22 2326  BP: (!) 143/100  (!) 192/105   Pulse:   97   Resp: 15  18   Temp:  98 F (36.7 C) 97.6 F (36.4 C)   TempSrc:  Oral Oral   SpO2: 96%  100%   Weight:    59.3 kg  Height:    '5\' 4"'$  (1.626 m)    Constitutional: Lethargic but arousable, oriented x1 no associated distress.   Skin: no rashes, no lesions, good skin turgor noted. Eyes: Pupils are equally reactive to light.  No evidence of scleral icterus or conjunctival pallor.  ENMT: Moist mucous membranes noted.  Posterior pharynx clear of any exudate or lesions.   Neck: normal, supple, no masses, no thyromegaly.  No evidence of jugular venous distension.   Respiratory: Rhonchi bilaterally with mild bibasilar rales, no evidence of wheezing, normal respiratory effort. No accessory muscle use.  Cardiovascular: Regular rate and rhythm, no murmurs / rubs / gallops. No extremity edema. 2+ pedal pulses. No carotid bruits.  Chest:   Nontender without crepitus or deformity.   Back:   Nontender without crepitus or deformity. Abdomen: Abdomen is soft and nontender.  No evidence of intra-abdominal masses.  Positive bowel sounds noted in all quadrants.   Musculoskeletal: No joint deformity  upper and lower extremities. Good ROM, no contractures. Normal muscle tone.  Neurologic: Patient intermittently following commands.  CN 2-12 grossly intact. Sensation intact.  Patient moving all 4 extremities spontaneously.  Patient is responsive to verbal stimuli.   Psychiatric: Unable to fully assess due to underlying confusion.  Patient currently does not seem to possess insight as to her current situation.    Data Reviewed:  I have personally reviewed and interpreted labs, imaging.  Significant findings are   Lab Results  Component Value Date   WBC 8.4 06/02/2022   HGB 14.7 06/02/2022  HCT 45.1 06/02/2022   MCV 87.4 06/02/2022   PLT 248 06/02/2022   Lab Results  Component Value Date   K 4.7 06/02/2022   Lab Results  Component Value Date   BUN 15 06/02/2022   Lab Results  Component Value Date   CREATININE 0.86 06/02/2022    CXR:  Chest X-ray was personally reviewed.  Hazy infiltrates noted at the right upper lobe.  No evidence of pleural effusion.  No evidence of pneumothorax.    Assessment and Plan: * Acute metabolic encephalopathy Patient presenting with a 1 week history of increasing lethargy confusion and intermittent visual hallucinations Per my discussion with the daughter patient has a known history of mild/moderate Alzheimer's dementia.  Presence of dementia does obscure assessment somewhat.  No focal physical exam findings to identify precipitating source of infection Work-up thus far has a urinalysis that is somewhat suggestive of a urinary tract infection in addition to patchy right lung infiltrates concerning for possible pneumonia.  We will treat for both with intravenous antibiotics for now. In the meantime expanding encephalopathy work-up including vitamin B12 level, TSH, ammonia, urine toxicology screen, RPR, inflammatory markers and CT imaging of the head Monitoring for clinical improvement on intravenous ceftriaxone and azithromycin Avoiding sedating  agents  Acute cystitis without hematuria In the absence of any other definitive cause of the patient's confusion, slightly abnormal urinalysis possibly suggestive of a urinary tract infection may prove to be the culprit EDP is already initiated intravenous ceftriaxone which will be continued at this time Urine cultures have been obtained and are pending Blood cultures have been ordered We will titrate antibiotic therapy based on results   Pneumonia of right lung due to infectious organism Hazy right-sided infiltrates noted on chest x-ray concerning for possible pneumonia Underlying infectious process such as pneumonia could be precipitating patient's acute metabolic encephalopathy Treating with intravenous antibiotics with ceftriaxone and azithromycin Obtaining blood cultures, inflammatory markers, procalcitonin Supplemental oxygen as necessary  Chronic diastolic CHF (congestive heart failure) (HCC) No clinical evidence of cardiogenic volume overload Strict input and output monitoring Daily weights Low-sodium diet   Essential hypertension Resume patients home regimen of oral antihypertensives Titrate antihypertensive regimen as necessary to achieve adequate BP control PRN intravenous antihypertensives for excessively elevated blood pressure    Type 2 diabetes mellitus with diabetic polyneuropathy, without long-term current use of insulin (HCC) Patient been placed on Accu-Cheks before every meal and nightly with sliding scale insulin Holding home regimen of oral hypoglycemics Hemoglobin A1C ordered Diabetic Diet   Alzheimer's dementia with behavioral disturbance ([place]) Longstanding known history of dementia and cognitive deficit although his daughter reports the patient symptoms are typically mild/moderate Presence of dementia complicates assessment of patient's encephalopathy which has worsened patient's confusion dramatically Minimizing uncomfortable stimuli Minimizing mood  altering and sedating agents Encouraging family to remain at bedside is much as possible Frequent redirection by staff Fall precautions   GERD without esophagitis Continuing home regimen of daily PPI therapy.   Mixed diabetic hyperlipidemia associated with type 2 diabetes mellitus ([place]) Continuing home regimen of lipid lowering therapy.        Code Status:  DNR  code status decision has been confirmed with: daughter Family Communication:    Consults: Case discussed with the patient's daughter Gwenlyn Saran at length who has been updated on plan of care.  Severity of Illness:  The appropriate patient status for this patient is OBSERVATION. Observation status is judged to be reasonable and necessary in order to provide the required  intensity of service to ensure the patient's safety. The patient's presenting symptoms, physical exam findings, and initial radiographic and laboratory data in the context of their medical condition is felt to place them at decreased risk for further clinical deterioration. Furthermore, it is anticipated that the patient will be medically stable for discharge from the hospital within 2 midnights of admission.   Author:  Vernelle Emerald MD  06/03/2022 3:17 AM

## 2022-06-02 NOTE — Assessment & Plan Note (Signed)
.   Continuing home regimen of lipid lowering therapy.  

## 2022-06-02 NOTE — ED Triage Notes (Signed)
Patient BIB [facility] from home where she lives with family with complaint of visual hallucinations. EMS reports family stated the patient has experienced these hallucinations "for a while" but has not had the symptoms evaluated until today. Patient is alert and in no apparent distress at this time. Patietn describes the hallucination as "little tiny people running around on the floor that do not say anything"

## 2022-06-02 NOTE — ED Provider Notes (Signed)
[facility] EMERGENCY DEPARTMENT Provider Note   CSN: [id number] Arrival date & time: 06/02/22  1056     History  Chief Complaint  Patient presents with   Hallucinations    Maria [name] is a 88 y.o. female.  88 year old female with a past medical history of diabetes, hypertension, dementia presents to the ED with a chief complaint of visual hallucinations.  History is obtained by daughter at the bedside who reports patient has been "sundowning", her hallucinations began approximately 1 week ago, where she reports that she sees little people running around.  She was evaluated by her primary care physician at the end of August, hallucinations were not present then.  Is worried that patient has not been really eating, has not been wanting to get out of bed and seems somewhat depressed.  She does have a history of UTIs, reports she had very little oral intake over the last couple of days, they have tried to feed her with small spoons of food, however patient has very little intake.  No fevers, no cough, no vomiting, no shortness of breath.  The history is provided by medical records and a caregiver.       Home Medications Prior to Admission medications   Medication Sig Start Date End Date Taking? Authorizing Provider  acetaminophen (TYLENOL) 650 MG CR tablet Take 1,300 mg by mouth in the morning and at bedtime.   Yes [provider]  aspirin EC 81 MG tablet Take 81 mg by mouth daily.   Yes [provider]  carboxymethylcellul-glycerin (OPTIVE) 0.5-0.9 % ophthalmic solution Place 1 drop into both eyes daily.   Yes [provider]  cycloSPORINE (RESTASIS) 0.05 % ophthalmic emulsion Place 1 drop into both eyes daily.    Yes [provider]  donepezil (ARICEPT) 10 MG tablet Take 10 mg by mouth at bedtime.   Yes [provider]  glimepiride (AMARYL) 1 MG tablet Take 1 mg by mouth in the morning and at bedtime. 10/23/19  Yes  [provider]  latanoprost (XALATAN) 0.005 % ophthalmic solution Place 1 drop into both eyes at bedtime. 06/10/20  Yes [provider]  losartan (COZAAR) 50 MG tablet Take 50 mg by mouth daily. 05/26/21  Yes [provider]  meclizine (ANTIVERT) 25 MG tablet Take 25 mg by mouth 3 (three) times daily as needed (vertigo).    Yes [provider]  meloxicam (MOBIC) 7.5 MG tablet Take 7.5 mg by mouth daily as needed (for arthritis pain).   Yes [provider]  metFORMIN (GLUCOPHAGE) 1000 MG tablet Take 1,000 mg by mouth daily with breakfast. 01/08/21  Yes [provider]  Multiple Vitamins-Minerals (PRESERVISION AREDS 2+MULTI VIT PO) Take 1 capsule by mouth daily.   Yes [provider]  REFRESH OPTIVE PF 0.5-0.9 % SOLN Place 1 drop into both eyes 2 (two) times daily as needed.   Yes [provider]  rosuvastatin (CRESTOR) 5 MG tablet Take 5 mg by mouth daily. 11/19/19  Yes [provider]  valACYclovir (VALTREX) 1000 MG tablet Take 1,000 mg by mouth as needed (breakouts).   Yes [provider]  nitroGLYCERIN (NITROSTAT) 0.4 MG SL tablet Place 0.4 mg under the tongue every 5 (five) minutes as needed for chest pain. 06/10/20   [provider]      Allergies    Vancomycin    Review of Systems   Review of Systems  Unable to perform ROS: Dementia  Constitutional:  Negative  for fever.  Respiratory:  Negative for shortness of breath.   Cardiovascular:  Negative for chest pain.  Gastrointestinal:  Negative for abdominal pain.  All other systems reviewed and are negative.   Physical Exam Updated Vital Signs BP 132/69 (BP Location: Left Arm)   Pulse 60   Temp 98.1 F (36.7 C) (Oral)   Resp 17   Ht '5\' 4"'$  (1.626 m)   Wt 59.3 kg   SpO2 100%   BMI 22.44 kg/m  Physical Exam Vitals and nursing note reviewed.  Constitutional:      Appearance: Normal appearance. She is not ill-appearing.  HENT:      Head: Normocephalic and atraumatic.     Mouth/Throat:     Mouth: Mucous membranes are moist.  Eyes:     Pupils: Pupils are equal, round, and reactive to light.  Cardiovascular:     Rate and Rhythm: Normal rate.  Pulmonary:     Effort: Pulmonary effort is normal.     Breath sounds: No wheezing.  Abdominal:     General: Abdomen is flat.     Tenderness: There is no abdominal tenderness.  Musculoskeletal:     Cervical back: Normal range of motion and neck supple.  Skin:    General: Skin is warm and dry.  Neurological:     Mental Status: She is alert and oriented to person, place, and time.     Comments: Moves upper and lower extremities. No facial droop. Slow to respond.      ED Results / Procedures / Treatments   Labs (all labs ordered are listed, but only abnormal results are displayed) Labs Reviewed  URINE CULTURE - Abnormal; Notable for the following components:      Result Value   Culture   (*)    Value: 40,000 COLONIES/mL MULTIPLE SPECIES PRESENT, SUGGEST RECOLLECTION   All other components within normal limits  COMPREHENSIVE METABOLIC PANEL - Abnormal; Notable for the following components:   Glucose, Bld 118 (*)    Alkaline Phosphatase 34 (*)    All other components within normal limits  ACETAMINOPHEN LEVEL - Abnormal; Notable for the following components:   Acetaminophen (Tylenol), Serum <10 (*)    All other components within normal limits  CBC - Abnormal; Notable for the following components:   RBC 5.16 (*)    All other components within normal limits  URINALYSIS, ROUTINE W REFLEX MICROSCOPIC - Abnormal; Notable for the following components:   APPearance HAZY (*)    Ketones, ur 5 (*)    Protein, ur 100 (*)    Leukocytes,Ua LARGE (*)    Bacteria, UA RARE (*)    Non Squamous Epithelial 0-5 (*)    All other components within normal limits  SALICYLATE LEVEL - Abnormal; Notable for the following components:   Salicylate Lvl <7.4 (*)    All other components within  normal limits  COMPREHENSIVE METABOLIC PANEL - Abnormal; Notable for the following components:   Potassium 3.3 (*)    Glucose, Bld 122 (*)    Total Protein 8.3 (*)    Total Bilirubin 1.5 (*)    All other components within normal limits  MAGNESIUM - Abnormal; Notable for the following components:   Magnesium 1.4 (*)    All other components within normal limits  CBC WITH DIFFERENTIAL/PLATELET - Abnormal; Notable for the following components:   RBC 5.20 (*)    All other components within normal limits  BLOOD GAS, VENOUS - Abnormal; Notable for the following components:  pH, Ven 7.45 (*)    pCO2, Ven 41 (*)    pO2, Ven <31 (*)    Bicarbonate 28.5 (*)    Acid-Base Excess 3.8 (*)    All other components within normal limits  RPR - Abnormal; Notable for the following components:   RPR Ser Ql Reactive (*)    All other components within normal limits  HEMOGLOBIN A1C - Abnormal; Notable for the following components:   Hgb A1c MFr Bld 6.0 (*)    All other components within normal limits  GLUCOSE, CAPILLARY - Abnormal; Notable for the following components:   Glucose-Capillary 132 (*)    All other components within normal limits  GLUCOSE, CAPILLARY - Abnormal; Notable for the following components:   Glucose-Capillary 111 (*)    All other components within normal limits  T.PALLIDUM AB, TOTAL - Abnormal; Notable for the following components:   T Pallidum Abs Reactive (*)    All other components within normal limits  GLUCOSE, CAPILLARY - Abnormal; Notable for the following components:   Glucose-Capillary 176 (*)    All other components within normal limits  COMPREHENSIVE METABOLIC PANEL - Abnormal; Notable for the following components:   Glucose, Bld 123 (*)    Alkaline Phosphatase 37 (*)    Total Bilirubin 1.3 (*)    All other components within normal limits  GLUCOSE, CAPILLARY - Abnormal; Notable for the following components:   Glucose-Capillary 108 (*)    All other components within  normal limits  GLUCOSE, CAPILLARY - Abnormal; Notable for the following components:   Glucose-Capillary 160 (*)    All other components within normal limits  GLUCOSE, CAPILLARY - Abnormal; Notable for the following components:   Glucose-Capillary 132 (*)    All other components within normal limits  COMPREHENSIVE METABOLIC PANEL - Abnormal; Notable for the following components:   CO2 20 (*)    Glucose, Bld 177 (*)    Total Bilirubin 1.6 (*)    All other components within normal limits  GLUCOSE, CAPILLARY - Abnormal; Notable for the following components:   Glucose-Capillary 145 (*)    All other components within normal limits  GLUCOSE, CAPILLARY - Abnormal; Notable for the following components:   Glucose-Capillary 160 (*)    All other components within normal limits  T.PALLIDUM AB, TOTAL - Abnormal; Notable for the following components:   T Pallidum Abs Reactive (*)    All other components within normal limits  GLUCOSE, CAPILLARY - Abnormal; Notable for the following components:   Glucose-Capillary 210 (*)    All other components within normal limits  GLUCOSE, CAPILLARY - Abnormal; Notable for the following components:   Glucose-Capillary 154 (*)    All other components within normal limits  GLUCOSE, CAPILLARY - Abnormal; Notable for the following components:   Glucose-Capillary 159 (*)    All other components within normal limits  CBC - Abnormal; Notable for the following components:   RBC 5.17 (*)    All other components within normal limits  COMPREHENSIVE METABOLIC PANEL - Abnormal; Notable for the following components:   Potassium 3.3 (*)    CO2 21 (*)    Glucose, Bld 123 (*)    All other components within normal limits  GLUCOSE, CAPILLARY - Abnormal; Notable for the following components:   Glucose-Capillary 128 (*)    All other components within normal limits  GLUCOSE, CAPILLARY - Abnormal; Notable for the following components:   Glucose-Capillary 134 (*)    All other  components within normal limits  COMPREHENSIVE METABOLIC PANEL - Abnormal; Notable for the following components:   Glucose, Bld 123 (*)    Total Bilirubin 1.6 (*)    All other components within normal limits  GLUCOSE, CAPILLARY - Abnormal; Notable for the following components:   Glucose-Capillary 197 (*)    All other components within normal limits  SARS CORONAVIRUS 2 BY RT PCR  CULTURE, BLOOD (ROUTINE X 2) W REFLEX TO ID PANEL  CULTURE, BLOOD (ROUTINE X 2) W REFLEX TO ID PANEL  ETHANOL  RAPID URINE DRUG SCREEN, HOSP PERFORMED  AMMONIA  C-REACTIVE PROTEIN  SEDIMENTATION RATE  VITAMIN B12  PROCALCITONIN  FOLATE  TSH  CBC  MAGNESIUM  GLUCOSE, CAPILLARY  CBC  CBC  GLUCOSE, CAPILLARY    EKG None  Radiology No results found.  Procedures Procedures    Medications Ordered in ED Medications  cefTRIAXone (ROCEPHIN) 1 g in sodium chloride 0.9 % 100 mL IVPB (0 g Intravenous Stopped 06/02/22 2043)  haloperidol lactate (HALDOL) injection 2.5 mg (2.5 mg Intravenous Given 06/03/22 0256)  magnesium sulfate IVPB 4 g 100 mL ( Intravenous Infusion Verify 06/03/22 1727)  potassium chloride SA (KLOR-CON M) CR tablet 60 mEq (60 mEq Oral Given 06/03/22 1517)  potassium chloride SA (KLOR-CON M) CR tablet 40 mEq (40 mEq Oral Given 06/06/22 2000)    ED Course/ Medical Decision Making/ A&P Clinical Course as of 06/22/22 2226  Wed Jun 02, 2022  1730 Leukocytes,Ua(!): LARGE [JS]  1730 WBC, UA: 21-50 [JS]  Tue Jun 22, 2022  2225 WBC, UA: 21-50 [JS]  2225 Squamous Epithelial / LPF: 0-5 [JS]    Clinical Course User Index [JS] Janeece Fitting, PA-C                           Medical Decision Making Amount and/or Complexity of Data Reviewed Labs: ordered. Decision-making details documented in ED Course.  Risk Decision regarding hospitalization.   This patient presents to the ED for concern of visual hallucinations, this involves a number of treatment options, and is a complaint that carries  with it a high risk of complications and morbidity.  The differential diagnosis includes infection, CVA versus UTI.    Co morbidities: Discussed in HPI   Brief History:  Patient here with underlying history of dementia with hallucinations for the past 1 week.  Most of the history is obtained from daughter at the bedside.  Patient has been "seeing little people running around ", she has had decrease in oral intake, has not been feeling like getting out of bed.  Daughter is concerned that she may be somewhat depressed.  EMR reviewed including pt PMHx, past surgical history and past visits to ER.   See HPI for more details   Lab Tests:  I ordered and independently interpreted labs.  The pertinent results include:    UA remarkable for large leukocytes, rare bacteria, with 21-50 white blood cell count consistent with urinary tract infection.  Blood work is currently pending.   Imaging Studies:  No imaging studies ordered for this patient   Medicines ordered:  I ordered medication including Rocephin for UTI Reevaluation of the patient after these medicines showed that the patient stayed the same I have reviewed the patients home medicines and have made adjustments as needed  Reevaluation:  After the interventions noted above I re-evaluated patient and found that they have :improved   Social Determinants of Health:  The patient's social determinants of health were a  factor in the care of this patient  Problem List / ED Course:  She here with visual hallucinations which have been ongoing for the past week.  Hemodynamically stable on arrival to the ED.  No sick contacts, no fevers, no nausea, no vomiting per family daughter at the bedside.  UA was obtained which does show large leukocytes, rare bacteria and 21-50 white blood cell count.  Given Rocephin while in the ED.  Awaiting lab work.  Due to standing mental status I do feel that patient needs to be admitted to the hospital  for further treatment of acute UTI.   Dispostion:  After consideration of the diagnostic results and the patients response to treatment, I feel that the patent would benefit from admission for UTI.    Portions of this note were generated with Lobby[employer]. Dictation errors may occur despite best attempts at proofreading.  Final Clinical Impression(s) / ED Diagnoses Final diagnoses:  Acute cystitis without hematuria  Altered mental status, unspecified altered mental status type    Rx / DC Orders ED Discharge Orders          Ordered    cefdinir (OMNICEF) 300 MG capsule  2 times daily        06/07/22 1228    doxycycline (VIBRA-TABS) 100 MG tablet  Every 12 hours        06/07/22 1228              Janeece Fitting, PA-C 06/22/22 2226    Valarie Merino, MD 06/23/22 0005

## 2022-06-02 NOTE — ED Notes (Signed)
Daughter says to call her if you need any answers about anything or for emergency until she gets back.

## 2022-06-02 NOTE — Assessment & Plan Note (Signed)
No clinical evidence of cardiogenic volume overload Strict input and output monitoring Daily weights Low-sodium diet  

## 2022-06-02 NOTE — Assessment & Plan Note (Signed)
   In the absence of any other definitive cause of the patient's confusion, slightly abnormal urinalysis possibly suggestive of a urinary tract infection may prove to be the culprit  EDP is already initiated intravenous ceftriaxone which will be continued at this time  Urine cultures have been obtained and are pending  Blood cultures have been ordered  We will titrate antibiotic therapy based on results

## 2022-06-02 NOTE — ED Provider Triage Note (Signed)
Emergency Medicine Provider Triage Evaluation Note  Maria [name] , a 88 y.o. female  was evaluated in triage.  Pt complains of hallucination. Family report pt is having visual hallucination including seeing shadows and ppl running around x 2-3 weeks.  No changes in medication or dysuria.  No recent sickness.  Daughter worries she may be depressed.  Hx of UTI. Is eating less  Review of Systems  Positive: As above Negative: As above  Physical Exam  BP (!) 148/80   Pulse 86   Temp 98.3 F (36.8 C) (Oral)   Resp 16   SpO2 98%  Gen:   Awake, no distress   Resp:  Normal effort  MSK:   Moves extremities without difficulty  Other:    Medical Decision Making  Medically screening exam initiated at 1:46 PM.  Appropriate orders placed.  Maria [name] was informed that the remainder of the evaluation will be completed by another provider, this initial triage assessment does not replace that evaluation, and the importance of remaining in the ED until their evaluation is complete.     Domenic Moras, PA-C 06/02/22 1417

## 2022-06-02 NOTE — Progress Notes (Signed)
New Admission Note:  Arrival Method: Stretcher  Mental Orientation: Alert to self only Telemetry: Box 1 NSR Assessment: Completed Skin: Warm and dry IV: NSL Pain: Denies Tubes: N/A Safety Measures: Safety Fall Prevention Plan initiated.  Admission: Completed 5 M  Orientation: Patient has been orientated to the room, unit and the staff. Welcome booklet given.  Family: N/A  Orders have been reviewed and implemented. Will continue to monitor the patient. Call light has been placed within reach and bed alarm has been activated.   Sima Matas BSN, RN  Phone Number: [phone]

## 2022-06-02 NOTE — Assessment & Plan Note (Signed)
.   Resume patients home regimen of oral antihypertensives . Titrate antihypertensive regimen as necessary to achieve adequate BP control . PRN intravenous antihypertensives for excessively elevated blood pressure   

## 2022-06-02 NOTE — Assessment & Plan Note (Signed)
   Patient presenting with a 1 week history of increasing lethargy confusion and intermittent visual hallucinations  Per my discussion with the daughter patient has a known history of mild/moderate Alzheimer's dementia.  Presence of dementia does obscure assessment somewhat.   No focal physical exam findings to identify precipitating source of infection  Work-up thus far has a urinalysis that is somewhat suggestive of a urinary tract infection in addition to patchy right lung infiltrates concerning for possible pneumonia.  We will treat for both with intravenous antibiotics for now.  In the meantime expanding encephalopathy work-up including vitamin B12 level, TSH, ammonia, urine toxicology screen, RPR, inflammatory markers and CT imaging of the head  Monitoring for clinical improvement on intravenous ceftriaxone and azithromycin  Avoiding sedating agents

## 2022-06-02 NOTE — Assessment & Plan Note (Signed)
.   Patient been placed on Accu-Cheks before every meal and nightly with sliding scale insulin . Holding home regimen of oral hypoglycemics . Hemoglobin A1C ordered . Diabetic Diet  

## 2022-06-02 NOTE — Assessment & Plan Note (Signed)
Continuing home regimen of daily PPI therapy.  

## 2022-06-03 DIAGNOSIS — G308 Other Alzheimer's disease: Secondary | ICD-10-CM | POA: Diagnosis not present

## 2022-06-03 DIAGNOSIS — H3530 Unspecified macular degeneration: Secondary | ICD-10-CM | POA: Diagnosis present

## 2022-06-03 DIAGNOSIS — F02B2 Dementia in other diseases classified elsewhere, moderate, with psychotic disturbance: Secondary | ICD-10-CM | POA: Diagnosis present

## 2022-06-03 DIAGNOSIS — G928 Other toxic encephalopathy: Secondary | ICD-10-CM | POA: Diagnosis not present

## 2022-06-03 DIAGNOSIS — I110 Hypertensive heart disease with heart failure: Secondary | ICD-10-CM | POA: Diagnosis present

## 2022-06-03 DIAGNOSIS — Z7401 Bed confinement status: Secondary | ICD-10-CM | POA: Diagnosis not present

## 2022-06-03 DIAGNOSIS — J189 Pneumonia, unspecified organism: Secondary | ICD-10-CM | POA: Diagnosis present

## 2022-06-03 DIAGNOSIS — Z1152 Encounter for screening for COVID-19: Secondary | ICD-10-CM | POA: Diagnosis not present

## 2022-06-03 DIAGNOSIS — R9431 Abnormal electrocardiogram [ECG] [EKG]: Secondary | ICD-10-CM

## 2022-06-03 DIAGNOSIS — G9341 Metabolic encephalopathy: Secondary | ICD-10-CM | POA: Diagnosis present

## 2022-06-03 DIAGNOSIS — F05 Delirium due to known physiological condition: Secondary | ICD-10-CM | POA: Diagnosis present

## 2022-06-03 DIAGNOSIS — D649 Anemia, unspecified: Secondary | ICD-10-CM | POA: Diagnosis not present

## 2022-06-03 DIAGNOSIS — E876 Hypokalemia: Secondary | ICD-10-CM | POA: Diagnosis present

## 2022-06-03 DIAGNOSIS — M19012 Primary osteoarthritis, left shoulder: Secondary | ICD-10-CM | POA: Diagnosis not present

## 2022-06-03 DIAGNOSIS — M160 Bilateral primary osteoarthritis of hip: Secondary | ICD-10-CM | POA: Diagnosis not present

## 2022-06-03 DIAGNOSIS — E1142 Type 2 diabetes mellitus with diabetic polyneuropathy: Secondary | ICD-10-CM | POA: Diagnosis present

## 2022-06-03 DIAGNOSIS — Z87440 Personal history of urinary (tract) infections: Secondary | ICD-10-CM | POA: Diagnosis not present

## 2022-06-03 DIAGNOSIS — I272 Pulmonary hypertension, unspecified: Secondary | ICD-10-CM | POA: Diagnosis present

## 2022-06-03 DIAGNOSIS — J986 Disorders of diaphragm: Secondary | ICD-10-CM | POA: Diagnosis not present

## 2022-06-03 DIAGNOSIS — M19011 Primary osteoarthritis, right shoulder: Secondary | ICD-10-CM | POA: Diagnosis not present

## 2022-06-03 DIAGNOSIS — A539 Syphilis, unspecified: Secondary | ICD-10-CM | POA: Diagnosis not present

## 2022-06-03 DIAGNOSIS — J168 Pneumonia due to other specified infectious organisms: Secondary | ICD-10-CM | POA: Diagnosis not present

## 2022-06-03 DIAGNOSIS — G9340 Encephalopathy, unspecified: Secondary | ICD-10-CM | POA: Diagnosis present

## 2022-06-03 DIAGNOSIS — E782 Mixed hyperlipidemia: Secondary | ICD-10-CM | POA: Diagnosis present

## 2022-06-03 DIAGNOSIS — Z66 Do not resuscitate: Secondary | ICD-10-CM | POA: Diagnosis present

## 2022-06-03 DIAGNOSIS — F02818 Dementia in other diseases classified elsewhere, unspecified severity, with other behavioral disturbance: Secondary | ICD-10-CM | POA: Diagnosis not present

## 2022-06-03 DIAGNOSIS — H353130 Nonexudative age-related macular degeneration, bilateral, stage unspecified: Secondary | ICD-10-CM | POA: Diagnosis not present

## 2022-06-03 DIAGNOSIS — R4182 Altered mental status, unspecified: Secondary | ICD-10-CM | POA: Diagnosis not present

## 2022-06-03 DIAGNOSIS — R410 Disorientation, unspecified: Secondary | ICD-10-CM | POA: Diagnosis not present

## 2022-06-03 DIAGNOSIS — M5136 Other intervertebral disc degeneration, lumbar region: Secondary | ICD-10-CM | POA: Diagnosis not present

## 2022-06-03 DIAGNOSIS — H814 Vertigo of central origin: Secondary | ICD-10-CM | POA: Diagnosis not present

## 2022-06-03 DIAGNOSIS — M6281 Muscle weakness (generalized): Secondary | ICD-10-CM | POA: Diagnosis not present

## 2022-06-03 DIAGNOSIS — H9193 Unspecified hearing loss, bilateral: Secondary | ICD-10-CM | POA: Diagnosis not present

## 2022-06-03 DIAGNOSIS — I10 Essential (primary) hypertension: Secondary | ICD-10-CM | POA: Diagnosis not present

## 2022-06-03 DIAGNOSIS — Z853 Personal history of malignant neoplasm of breast: Secondary | ICD-10-CM | POA: Diagnosis not present

## 2022-06-03 DIAGNOSIS — F064 Anxiety disorder due to known physiological condition: Secondary | ICD-10-CM | POA: Diagnosis not present

## 2022-06-03 DIAGNOSIS — K219 Gastro-esophageal reflux disease without esophagitis: Secondary | ICD-10-CM | POA: Diagnosis present

## 2022-06-03 DIAGNOSIS — E1169 Type 2 diabetes mellitus with other specified complication: Secondary | ICD-10-CM | POA: Diagnosis present

## 2022-06-03 DIAGNOSIS — Z8673 Personal history of transient ischemic attack (TIA), and cerebral infarction without residual deficits: Secondary | ICD-10-CM | POA: Diagnosis not present

## 2022-06-03 DIAGNOSIS — G309 Alzheimer's disease, unspecified: Secondary | ICD-10-CM | POA: Diagnosis present

## 2022-06-03 DIAGNOSIS — N3000 Acute cystitis without hematuria: Secondary | ICD-10-CM | POA: Diagnosis present

## 2022-06-03 DIAGNOSIS — I442 Atrioventricular block, complete: Secondary | ICD-10-CM | POA: Diagnosis present

## 2022-06-03 DIAGNOSIS — I5032 Chronic diastolic (congestive) heart failure: Secondary | ICD-10-CM | POA: Diagnosis present

## 2022-06-03 DIAGNOSIS — F02B18 Dementia in other diseases classified elsewhere, moderate, with other behavioral disturbance: Secondary | ICD-10-CM | POA: Diagnosis present

## 2022-06-03 LAB — CBC WITH DIFFERENTIAL/PLATELET
Abs Immature Granulocytes: 0.01 10*3/uL (ref 0.00–0.07)
Basophils Absolute: 0.1 10*3/uL (ref 0.0–0.1)
Basophils Relative: 1 %
Eosinophils Absolute: 0 10*3/uL (ref 0.0–0.5)
Eosinophils Relative: 0 %
HCT: 44.3 % (ref 36.0–46.0)
Hemoglobin: 15 g/dL (ref 12.0–15.0)
Immature Granulocytes: 0 %
Lymphocytes Relative: 37 %
Lymphs Abs: 2.8 10*3/uL (ref 0.7–4.0)
MCH: 28.8 pg (ref 26.0–34.0)
MCHC: 33.9 g/dL (ref 30.0–36.0)
MCV: 85.2 fL (ref 80.0–100.0)
Monocytes Absolute: 0.6 10*3/uL (ref 0.1–1.0)
Monocytes Relative: 7 %
Neutro Abs: 4.1 10*3/uL (ref 1.7–7.7)
Neutrophils Relative %: 55 %
Platelets: 254 10*3/uL (ref 150–400)
RBC: 5.2 MIL/uL — ABNORMAL HIGH (ref 3.87–5.11)
RDW: 13 % (ref 11.5–15.5)
WBC: 7.6 10*3/uL (ref 4.0–10.5)
nRBC: 0 % (ref 0.0–0.2)

## 2022-06-03 LAB — COMPREHENSIVE METABOLIC PANEL
ALT: 21 U/L (ref 0–44)
AST: 27 U/L (ref 15–41)
Albumin: 4.6 g/dL (ref 3.5–5.0)
Alkaline Phosphatase: 42 U/L (ref 38–126)
Anion gap: 14 (ref 5–15)
BUN: 12 mg/dL (ref 8–23)
CO2: 23 mmol/L (ref 22–32)
Calcium: 10 mg/dL (ref 8.9–10.3)
Chloride: 101 mmol/L (ref 98–111)
Creatinine, Ser: 0.71 mg/dL (ref 0.44–1.00)
GFR, Estimated: >60 mL/min (ref >60)
Glucose, Bld: 122 mg/dL — ABNORMAL HIGH (ref 70–99)
Potassium: 3.3 mmol/L — ABNORMAL LOW (ref 3.5–5.1)
Sodium: 138 mmol/L (ref 135–145)
Total Bilirubin: 1.5 mg/dL — ABNORMAL HIGH (ref 0.3–1.2)
Total Protein: 8.3 g/dL — ABNORMAL HIGH (ref 6.5–8.1)

## 2022-06-03 LAB — GLUCOSE, CAPILLARY
Glucose-Capillary: 132 mg/dL — ABNORMAL HIGH (ref 70–99)
Glucose-Capillary: 111 mg/dL — ABNORMAL HIGH (ref 70–99)
Glucose-Capillary: 176 mg/dL — ABNORMAL HIGH (ref 70–99)
Glucose-Capillary: 108 mg/dL — ABNORMAL HIGH (ref 70–99)

## 2022-06-03 LAB — HEMOGLOBIN A1C
Hgb A1c MFr Bld: 6 % — ABNORMAL HIGH (ref 4.8–5.6)
Mean Plasma Glucose: 125.5 mg/dL

## 2022-06-03 LAB — C-REACTIVE PROTEIN: CRP: 0.9 mg/dL (ref <1.0)

## 2022-06-03 LAB — BLOOD GAS, VENOUS
Acid-Base Excess: 3.8 mmol/L — ABNORMAL HIGH (ref 0.0–2.0)
Bicarbonate: 28.5 mmol/L — ABNORMAL HIGH (ref 20.0–28.0)
Drawn by: 144801
O2 Saturation: 38.5 %
Patient temperature: 36.5
pCO2, Ven: 41 mmHg — ABNORMAL LOW (ref 44–60)
pH, Ven: 7.45 — ABNORMAL HIGH (ref 7.25–7.43)
pO2, Ven: <31 mmHg — CL (ref 32–45)

## 2022-06-03 LAB — MAGNESIUM: Magnesium: 1.4 mg/dL — ABNORMAL LOW (ref 1.7–2.4)

## 2022-06-03 LAB — SARS CORONAVIRUS 2 BY RT PCR: SARS Coronavirus 2 by RT PCR: NEGATIVE

## 2022-06-03 LAB — TSH: TSH: 0.621 u[IU]/mL (ref 0.350–4.500)

## 2022-06-03 LAB — RPR
RPR Ser Ql: REACTIVE — AB
RPR Titer: 1:2 {titer}

## 2022-06-03 LAB — PROCALCITONIN: Procalcitonin: <0.1 ng/mL

## 2022-06-03 LAB — FOLATE: Folate: 10.8 ng/mL (ref >5.9)

## 2022-06-03 LAB — VITAMIN B12: Vitamin B-12: 447 pg/mL (ref 180–914)

## 2022-06-03 LAB — SEDIMENTATION RATE: Sed Rate: 6 mm/hr (ref 0–22)

## 2022-06-03 MED ORDER — LORAZEPAM 2 MG/ML IJ SOLN
0.5000 mg | INTRAMUSCULAR | Status: DC | PRN
  Administered 2022-06-06: 0.5 mg via INTRAVENOUS
  Filled 2022-06-03: qty 1

## 2022-06-03 MED ORDER — ENOXAPARIN SODIUM 40 MG/0.4ML IJ SOSY
40.0000 mg | PREFILLED_SYRINGE | INTRAMUSCULAR | Status: DC
  Administered 2022-06-04 – 2022-06-07 (×4): 40 mg via SUBCUTANEOUS
  Filled 2022-06-03 (×4): qty 0.4

## 2022-06-03 MED ORDER — HALOPERIDOL LACTATE 5 MG/ML IJ SOLN
2.5000 mg | Freq: Once | INTRAMUSCULAR | Status: AC
  Administered 2022-06-03: 2.5 mg via INTRAVENOUS
  Filled 2022-06-03: qty 1

## 2022-06-03 MED ORDER — MAGNESIUM SULFATE 4 GM/100ML IV SOLN
4.0000 g | Freq: Once | INTRAVENOUS | Status: AC
  Administered 2022-06-03: 4 g via INTRAVENOUS
  Filled 2022-06-03: qty 100

## 2022-06-03 MED ORDER — ORAL CARE MOUTH RINSE: 15.0000 mL | OROMUCOSAL | Status: DC | PRN

## 2022-06-03 MED ORDER — QUETIAPINE FUMARATE 25 MG PO TABS: 25.0000 mg | ORAL_TABLET | Freq: Every day | ORAL | Status: DC | PRN

## 2022-06-03 MED ORDER — SODIUM CHLORIDE 0.9 % IV SOLN: 500.0000 mg | INTRAVENOUS | Status: DC

## 2022-06-03 MED ORDER — SODIUM CHLORIDE 0.9 % IV SOLN
100.0000 mg | Freq: Two times a day (BID) | INTRAVENOUS | Status: DC
  Administered 2022-06-03 – 2022-06-04 (×3): 100 mg via INTRAVENOUS
  Filled 2022-06-03 (×4): qty 100

## 2022-06-03 MED ORDER — POTASSIUM CHLORIDE CRYS ER 20 MEQ PO TBCR
60.0000 meq | EXTENDED_RELEASE_TABLET | Freq: Once | ORAL | Status: AC
  Administered 2022-06-03: 60 meq via ORAL
  Filled 2022-06-03: qty 3

## 2022-06-03 NOTE — Hospital Course (Signed)
88 year old female with past medical history of Alzheimer Dementia, complete heart block (S/P PPM placement 2003 with generator change 01/3201), diastolic congestive heart failure (Echo 09/2016 EF 50-55% with G1DD),  non-insulin-dependent diabetes mellitus type 2, hyperlipidemia, hypertension, prior stroke (10/2019), breast cancer (2015) who presents to [facility] emergency department with family reports of confusion and intermittent hallucinations.  On evaluation, pt was found to have UA suggestive of UTI.

## 2022-06-03 NOTE — Assessment & Plan Note (Signed)
   Longstanding known history of dementia and cognitive deficit although his daughter reports the patient symptoms are typically mild/moderate  Presence of dementia complicates assessment of patient's encephalopathy which has worsened patient's confusion dramatically  Minimizing uncomfortable stimuli  Minimizing mood altering and sedating agents  Encouraging family to remain at bedside is much as possible  Frequent redirection by staff  Fall precautions

## 2022-06-03 NOTE — Plan of Care (Signed)
?  Problem: Clinical Measurements: ?Goal: Will remain free from infection ?Outcome: Progressing ?  ?

## 2022-06-03 NOTE — Assessment & Plan Note (Signed)
   Hazy right-sided infiltrates noted on chest x-ray concerning for possible pneumonia  Underlying infectious process such as pneumonia could be precipitating patient's acute metabolic encephalopathy  Treating with intravenous antibiotics with ceftriaxone and azithromycin  Obtaining blood cultures, inflammatory markers, procalcitonin  Supplemental oxygen as necessary

## 2022-06-03 NOTE — Progress Notes (Signed)
Contacted provider regarding critical blood gas provider requested pt to be on 2 L O2

## 2022-06-03 NOTE — Plan of Care (Signed)
  Problem: Clinical Measurements: Goal: Respiratory complications will improve Outcome: Progressing Goal: Cardiovascular complication will be avoided Outcome: Progressing   Problem: Elimination: Goal: Will not experience complications related to urinary retention Outcome: Progressing

## 2022-06-03 NOTE — Progress Notes (Signed)
When gave medications tried to keep patient awake to eat breakfast.  Fed her some applesauce but she fell asleep after a few bites

## 2022-06-03 NOTE — Progress Notes (Signed)
OT Cancellation Note  Patient Details Name: Maria [name] MRN: [id number] DOB: 05-Jan-1924   Cancelled Treatment:    Reason Eval/Treat Not Completed: Fatigue/lethargy limiting ability to participate Pt received Haldol overnight and still very lethargic this afternoon. Pt briefly wakes up when family talk to her but falls back asleep quickly. Will follow up for OT eval tomorrow.   Layla Maw 06/03/2022, 1:01 PM

## 2022-06-03 NOTE — Progress Notes (Signed)
Unable to get her telemetry on. Patient keeps taking it off. Unable to reeducate due to confusion.

## 2022-06-03 NOTE — Progress Notes (Signed)
Progress Note   Patient: Maria [name] HER:[id number] DOB: 1924-04-15 DOA: 06/02/2022     0 DOS: the patient was seen and examined on 06/03/2022   Brief hospital course: 88 year old female with past medical history of Alzheimer Dementia, complete heart block (S/P PPM placement 2003 with generator change 04/5630), diastolic congestive heart failure (Echo 09/2016 EF 50-55% with G1DD),  non-insulin-dependent diabetes mellitus type 2, hyperlipidemia, hypertension, prior stroke (10/2019), breast cancer (2015) who presents to [facility] emergency department with family reports of confusion and intermittent hallucinations.  On evaluation, pt was found to have UA suggestive of UTI.  Assessment and Plan: * Acute metabolic encephalopathy Patient presenting with a 1 week history of increasing lethargy confusion and intermittent visual hallucinations Patient has a known history of mild/moderate Alzheimer's dementia.  Work-up thus far has a urinalysis that is somewhat suggestive of a urinary tract infection in addition to patchy right lung infiltrates concerning for possible pneumonia.   Cont on empiric rocephin Urine and blood cx pending   Acute cystitis without hematuria In the absence of any other definitive cause of the patient's confusion, slightly abnormal urinalysis possibly suggestive of a urinary tract infection may prove to be the culprit Urine cultures have been obtained and are pending Blood cultures pending Cont empiric rocephin    Pneumonia of right lung due to infectious organism Hazy right-sided infiltrates noted on chest x-ray concerning for possible pneumonia Underlying infectious process such as pneumonia could be precipitating patient's acute metabolic encephalopathy Treating with intravenous antibiotics with ceftriaxone and azithromycin Obtaining blood cultures, inflammatory markers, procalcitonin Cont supplemental O2   Chronic diastolic CHF (congestive heart failure)  (HCC) No clinical evidence of cardiogenic volume overload Strict input and output monitoring Daily weights Low-sodium diet    Essential hypertension Cont losartan per home regimen BP stable and controlled    Type 2 diabetes mellitus with diabetic polyneuropathy, without long-term current use of insulin ([name]) Patient been placed on Accu-Cheks before every meal and nightly with sliding scale insulin Holding home regimen of oral hypoglycemics Hemoglobin A1C of 6.0 Diabetic Diet     Alzheimer's dementia with behavioral disturbance (Maria [name]) Longstanding known history of dementia and cognitive deficit noted Presence of dementia complicates assessment of patient's encephalopathy which has worsened patient's confusion dramatically Minimizing uncomfortable stimuli Minimizing mood altering and sedating agents Encouraging family to remain at bedside is much as possible Frequent redirection by staff Fall precautions PT/OT consulted    GERD without esophagitis Continuing home regimen of daily PPI therapy.    Mixed diabetic hyperlipidemia associated with type 2 diabetes mellitus ([place]) Continuing home regimen of lipid lowering therapy.  Hypokalemia -Will replace -Recheck bmet in AM  Hypomagnesemia -Will replace -Recheck bmet in AM      Subjective: Pleasantly confused this AM  Physical Exam: Vitals:   06/02/22 2326 06/03/22 0505 06/03/22 0812 06/03/22 0940  BP:  (!) 156/62  (!) 108/97  Pulse:  (!) 59  80  Resp:  18  18  Temp:  (!) 97.4 F (36.3 C) 97.7 F (36.5 C) 97.9 F (36.6 C)  TempSrc:  Oral Oral Oral  SpO2:  100%  100%  Weight: 59.3 kg     Height: '5\' 4"'$  (1.626 m)      General exam: Asleep, arousable, laying in bed, in nad Respiratory system: Normal respiratory effort, no wheezing Cardiovascular system: regular rate, s1, s2 Gastrointestinal system: Soft, nondistended, positive BS Central nervous system: CN2-12 grossly intact, strength intact Extremities: Perfused,  no clubbing Skin: Normal  skin turgor, no notable skin lesions seen Psychiatry: Mood normal // no visual hallucinations   Data Reviewed:  Labs reviewed: Na 138, K 3.3, Cr 0.71, Mg 1.4  Family Communication: Pt in room, family not at bedside  Disposition: Status is: Observation The patient will require care spanning > 2 midnights and should be moved to inpatient because: Severity of illness  Planned Discharge Destination:  Unclear at this time     Author: Marylu Lund, MD 06/03/2022 2:00 PM  For on call review [email or web address].

## 2022-06-03 NOTE — Progress Notes (Signed)
HOSPITAL MEDICINE OVERNIGHT EVENT NOTE    Due to patient's ongoing severe agitation and confusion throughout the evening and requiring a one-time dose of Haldol 2.5 mg IV x1 an order was placed for an EKG.  Finally after numerous attempts by nursing staff they were finally able to obtain an EKG inbetween patient's bouts of agitation.  I was then notified as to patient's extremely prolonged QT.   Upon being notified I immediately discontinued patient's home regimen of Seroquel, Aricept and Remeron.  Azithromycin has been discontinued and patient is instead being switched to doxycycline.  Zofran has additionally been discontinued.  Awaiting repeat electrolytes including magnesium levels this morning and will replace electrolytes as necessary.  Monitoring patient closely on telemetry.  Further dosing of Haldol must be avoided going forward.   Vernelle Emerald  MD [facility]

## 2022-06-03 NOTE — TOC Initial Note (Signed)
Transition of Care [facility]) - Initial/Assessment Note    Patient Details  Name: Maria [name] MRN: [id number] Date of Birth: 06-14-1924  [facility] CM/SW Contact:    Tom-Johnson, Renea Ee, RN Phone Number: 06/03/2022, 1:08 PM  Clinical Narrative:                  CM spoke with patient and daughter, Maria [name] at bedside about discharge needs. Patient sleeping most of the time and Maria [name] answered questions.  Admitted for Acute Encephalopathy with hallucinations, currently on 2L O2 acute. From home alone, grandson spends the night with patient. Has two supportive children. Maria [name] states someone comes in on Tuesdays and Thursdays to help patient with baths as private pay. Has a cane, walker, w/c, walk-in shower with seat ans grab bars at home.  PCP is  Audley Hose, MD and uses [facility] [address]. Maria [name] requesting patient go to rehab facility before returning home. CM explained to Maria [name] that PT/OT has to evaluate and make their recommendations, request will be documented.  Awaits PT/OT eval for disposition.  CM will continue to follow as patient progresses with care towards discharge.       Barriers to Discharge: Continued Medical Work up   Patient Goals and CMS Choice Patient states their goals for this hospitalization and ongoing recovery are:: Daughter, Maria [name] requesting SNF for rehab before returning home. CMS Medicare.gov Compare Post Acute Care list provided to:: Patient Choice offered to / list presented to : Adult Children  Expected Discharge Plan and Services     Discharge Planning Services: CM Consult   Living arrangements for the past 2 months: Single Family Home                                      Prior Living Arrangements/Services Living arrangements for the past 2 months: Single Family Home Lives with:: Self (Grandson spends the night.) Patient language and need for interpreter reviewed:: Yes Do you feel safe going back to the  place where you live?: Yes      Need for Family Participation in Patient Care: Yes (Comment) Care giver support system in place?: Yes (comment) Current home services: DME (Cane, walker, w/c walk-in shower with seat, grab bars) Criminal Activity/Legal Involvement Pertinent to Current Situation/Hospitalization: No - Comment as needed  Activities of Daily Living Home Assistive Devices/Equipment: None ADL Screening (condition at time of admission) Patient's cognitive ability adequate to safely complete daily activities?: No Is the patient deaf or have difficulty hearing?: No Does the patient have difficulty seeing, even when wearing glasses/contacts?: No Does the patient have difficulty concentrating, remembering, or making decisions?: Yes Patient able to express need for assistance with ADLs?: Yes Does the patient have difficulty dressing or bathing?: No Independently performs ADLs?: No Communication: Independent Dressing (OT): Needs assistance Is this a change from baseline?: Pre-admission baseline Grooming: Needs assistance Is this a change from baseline?: Pre-admission baseline Feeding: Independent Bathing: Needs assistance Is this a change from baseline?: Pre-admission baseline Toileting: Needs assistance Is this a change from baseline?: Pre-admission baseline In/Out Bed: Needs assistance Is this a change from baseline?: Pre-admission baseline Walks in Home: Needs assistance Is this a change from baseline?: Pre-admission baseline Does the patient have difficulty walking or climbing stairs?: Yes Weakness of Legs: None Weakness of Arms/Hands: None  Permission Sought/Granted Permission sought to share information with : Case Manager, Engineer, manufacturing  Representative, Family Supports Permission granted to share information with : Yes, Verbal Permission Granted              Emotional Assessment Appearance:: Appears stated age Attitude/Demeanor/Rapport: Lethargic, Gracious  (Sleeping most of the time.) Affect (typically observed): Appropriate Orientation: : Oriented to Self, Oriented to Place Alcohol / Substance Use: Not Applicable Psych Involvement: No (comment)  Admission diagnosis:  Acute cystitis without hematuria [N30.00] Altered mental status, unspecified altered mental status type [L37.34] Acute metabolic encephalopathy [K87.68] Patient Active Problem List   Diagnosis Date Noted   Pneumonia of right lung due to infectious organism 06/03/2022   Alzheimer's dementia with behavioral disturbance ([place]) 06/03/2022   Prolonged QT interval 11/57/2620   Acute metabolic encephalopathy 35/59/7416   Acute cystitis without hematuria 06/02/2022   Chronic diastolic CHF (congestive heart failure) ([place]) 06/02/2022   Mixed diabetic hyperlipidemia associated with type 2 diabetes mellitus ([place]) 06/02/2022   GERD without esophagitis 06/02/2022   At risk for falls 03/11/2021   Bilateral hearing loss 03/11/2021   Dementia with behavioral disturbance ([place]) 03/11/2021   Depressive disorder 03/11/2021   Pulmonary hypertension ([place]) 03/11/2021   Insomnia 12/23/2020   Dysphagia, unspecified 12/23/2020   Primary generalized (osteo)arthritis 12/15/2020   Occlusion and stenosis of unspecified carotid artery 09/13/2020   Long term (current) use of oral hypoglycemic drugs 09/13/2020   History of falling 09/13/2020   Retention of urine, unspecified 09/13/2020   Unspecified urinary incontinence 09/13/2020   Severe sepsis ([place]) 08/08/2020   Acute kidney injury ([place]) 08/08/2020   Acute UTI (urinary tract infection) 08/08/2020   Carotid atherosclerosis 05/29/2020   Multinodular goiter 05/29/2020   Multiple lacunar infarcts (HCC) 05/29/2020   Abnormal serum thyroid stimulating hormone (TSH) level 05/28/2020   Constipation 11/27/2019   Impaired cognition 11/27/2019   Elevated lactic acid level 10/17/2019   Generalized weakness 10/17/2019   Hypokalemia 10/17/2019    Dehydration 10/17/2019   Acute encephalopathy 10/16/2019   Heart block AV complete ([place]) 04/13/2019   Normocytic anemia 11/01/2018   Diverticulosis of colon 07/22/2018   Mixed hyperlipidemia 11/22/2017   Nephropathy due to secondary diabetes mellitus ([place]) 11/22/2017   Polyneuropathy due to type 2 diabetes mellitus ([place]) 06/30/2017   Acute acalculous cholecystitis 10/07/2016   Elevated troponin 10/07/2016   Lactic acidosis 10/07/2016   Sinus tachycardia 10/07/2016   Dyslipidemia 06/05/2013   Cardiac pacemaker 05/30/2013   Type 2 diabetes mellitus with diabetic polyneuropathy, without long-term current use of insulin ([place]) 05/21/2013   Chest pain 05/20/2013   Essential hypertension 05/20/2013   PCP:  Audley Hose, MD Pharmacy:   [facility] [address], [place] - [address] [place] [address] Phone: [phone] Fax: [phone]  [id number] - [place], [place] - [address] [place] [address] [place] [address] Phone: [phone] Fax: [phone]     Social Determinants of Health (SDOH) Interventions    Readmission Risk Interventions     No data to display

## 2022-06-03 NOTE — Care Management Obs Status (Signed)
[place] NOTIFICATION   Patient Details  Name: Maria [name] MRN: [id number] Date of Birth: November 06, 1923   Medicare Observation Status Notification Given:  Yes    Tom-Johnson, Renea Ee, RN 06/03/2022, 1:32 PM

## 2022-06-04 DIAGNOSIS — R4182 Altered mental status, unspecified: Secondary | ICD-10-CM

## 2022-06-04 DIAGNOSIS — I5032 Chronic diastolic (congestive) heart failure: Secondary | ICD-10-CM | POA: Diagnosis not present

## 2022-06-04 DIAGNOSIS — G9341 Metabolic encephalopathy: Secondary | ICD-10-CM | POA: Diagnosis not present

## 2022-06-04 DIAGNOSIS — G309 Alzheimer's disease, unspecified: Secondary | ICD-10-CM | POA: Diagnosis not present

## 2022-06-04 LAB — CBC
HCT: 40.9 % (ref 36.0–46.0)
Hemoglobin: 13.8 g/dL (ref 12.0–15.0)
MCH: 28.8 pg (ref 26.0–34.0)
MCHC: 33.7 g/dL (ref 30.0–36.0)
MCV: 85.4 fL (ref 80.0–100.0)
Platelets: 228 10*3/uL (ref 150–400)
RBC: 4.79 MIL/uL (ref 3.87–5.11)
RDW: 13 % (ref 11.5–15.5)
WBC: 7.3 10*3/uL (ref 4.0–10.5)
nRBC: 0 % (ref 0.0–0.2)

## 2022-06-04 LAB — GLUCOSE, CAPILLARY
Glucose-Capillary: 85 mg/dL (ref 70–99)
Glucose-Capillary: 160 mg/dL — ABNORMAL HIGH (ref 70–99)
Glucose-Capillary: 132 mg/dL — ABNORMAL HIGH (ref 70–99)
Glucose-Capillary: 145 mg/dL — ABNORMAL HIGH (ref 70–99)

## 2022-06-04 LAB — COMPREHENSIVE METABOLIC PANEL
ALT: 20 U/L (ref 0–44)
AST: 27 U/L (ref 15–41)
Albumin: 3.8 g/dL (ref 3.5–5.0)
Alkaline Phosphatase: 37 U/L — ABNORMAL LOW (ref 38–126)
Anion gap: 10 (ref 5–15)
BUN: 11 mg/dL (ref 8–23)
CO2: 24 mmol/L (ref 22–32)
Calcium: 9.1 mg/dL (ref 8.9–10.3)
Chloride: 104 mmol/L (ref 98–111)
Creatinine, Ser: 0.85 mg/dL (ref 0.44–1.00)
GFR, Estimated: >60 mL/min (ref >60)
Glucose, Bld: 123 mg/dL — ABNORMAL HIGH (ref 70–99)
Potassium: 4.2 mmol/L (ref 3.5–5.1)
Sodium: 138 mmol/L (ref 135–145)
Total Bilirubin: 1.3 mg/dL — ABNORMAL HIGH (ref 0.3–1.2)
Total Protein: 7.1 g/dL (ref 6.5–8.1)

## 2022-06-04 LAB — MAGNESIUM: Magnesium: 2.4 mg/dL (ref 1.7–2.4)

## 2022-06-04 LAB — URINE CULTURE: Culture: 40000 — AB

## 2022-06-04 LAB — T.PALLIDUM AB, TOTAL: T Pallidum Abs: REACTIVE — AB

## 2022-06-04 MED ORDER — DOXYCYCLINE HYCLATE 100 MG PO TABS
100.0000 mg | ORAL_TABLET | Freq: Two times a day (BID) | ORAL | Status: DC
  Administered 2022-06-04 – 2022-06-07 (×6): 100 mg via ORAL
  Filled 2022-06-04 (×5): qty 1

## 2022-06-04 NOTE — Evaluation (Signed)
Occupational Therapy Evaluation Patient Details Name: Maria [name] MRN: [id number] DOB: [birth year] Today's Date: 06/04/2022   History of Present Illness Patient is a 88 y/o female who presents on 9/20 with confusion and visual hallucinations. Admitted with acute metabolic encephalopathy secondary to possible UTI and PNA. PMH includes DM, HTN, severe pulmonary HTN, chronic hypoxic respiratory failure, complete heart block s/p PPM, breast ca (2015), macular degeneration, dementia.   Clinical Impression   Pt reports at baseline she was having assistance with some ADL and her son would stay with her during the day, however pt provided vague details. Pt with hx of Alzheimer's at baseline. Per chart review, pt had some assistance at d/c unclear if 24/7 assistance is available. Pt currently requires minA for stand-pivot transfer from EOB to [place] for posterior care. Pt with unclear report of amount of support available at home. She will currently require 24/7 assistance. At this time, recommend d/c to further rehab unless family is able to provide 24/7 physical assistance. Will continue to follow acutely.       Recommendations for follow up therapy are one component of a multi-disciplinary discharge planning process, led by the attending physician.  Recommendations may be updated based on patient status, additional functional criteria and insurance authorization.   Follow Up Recommendations  Skilled nursing-short term rehab (<3 hours/day)    Assistance Recommended at Discharge Frequent or constant Supervision/Assistance  Patient can return home with the following A little help with walking and/or transfers;A little help with bathing/dressing/bathroom;Assistance with cooking/housework;Direct supervision/assist for medications management;Direct supervision/assist for financial management;Assist for transportation    Functional Status Assessment  Patient has had a recent decline in their  functional status and demonstrates the ability to make significant improvements in function in a reasonable and predictable amount of time.  Equipment Recommendations  BSC/3in1    Recommendations for Other Services       Precautions / Restrictions Precautions Precautions: Fall Restrictions Weight Bearing Restrictions: No      Mobility Bed Mobility Overal bed mobility: Needs Assistance Bed Mobility: Rolling, Sidelying to Sit Rolling: Min guard Sidelying to sit: Min assist       General bed mobility comments: minguard for sequencing and multimodal cues for reaching and rolling to get off bed pan and minA to progress trunk to upright posture sitting EOB    Transfers Overall transfer level: Needs assistance Equipment used: Rolling walker (2 wheels) Transfers: Sit to/from Stand, Bed to chair/wheelchair/BSC Sit to Stand: Min assist Stand pivot transfers: Min assist         General transfer comment: minA for cues for safe hand placement and to boost into standing. minA for stability with stand pivot transfer from EOB > BSC > recliner      Balance Overall balance assessment: Needs assistance Sitting-balance support: No upper extremity supported, Feet supported Sitting balance-Leahy Scale: Fair     Standing balance support: Bilateral upper extremity supported, During functional activity Standing balance-Leahy Scale: Poor Standing balance comment: reliant on BUE support on RW for stability                           ADL either performed or assessed with clinical judgement   ADL Overall ADL's : Needs assistance/impaired Eating/Feeding: Set up;Sitting   Grooming: Min guard;Standing   Upper Body Bathing: Set up;Sitting   Lower Body Bathing: Minimal assistance;Sit to/from stand   Upper Body Dressing : Set up   Lower Body Dressing: Minimal  assistance;Sit to/from stand   Toilet Transfer: Minimal assistance;Stand-pivot;BSC/3in1;Rolling walker (2  wheels) Toilet Transfer Details (indicate cue type and reason): pt had small loose BM on BSC Toileting- Clothing Manipulation and Hygiene: Moderate assistance;Sit to/from stand Toileting - Clothing Manipulation Details (indicate cue type and reason): modA for posterior care in standing     Functional mobility during ADLs: Minimal assistance;Rolling walker (2 wheels)       Vision         Perception     Praxis      Pertinent Vitals/Pain Pain Assessment Pain Assessment: No/denies pain     Hand Dominance Right   Extremity/Trunk Assessment Upper Extremity Assessment Upper Extremity Assessment: Generalized weakness   Lower Extremity Assessment Lower Extremity Assessment: Generalized weakness   Cervical / Trunk Assessment Cervical / Trunk Assessment: Normal   Communication Communication Communication: No difficulties   Cognition Arousal/Alertness: Awake/alert Behavior During Therapy: WFL for tasks assessed/performed Overall Cognitive Status: History of cognitive impairments - at baseline                                 General Comments: pt with hx of Alzheimer's at baseline, pt currently [facility] for tasks assessed.     General Comments  pt SpO2 100% on RA    Exercises     Shoulder Instructions      Home Living Family/patient expects to be discharged to:: Private residence Living Arrangements: Children Available Help at Discharge: Family;Available PRN/intermittently Type of Home: House Home Access: Level entry     Home Layout: One level     Bathroom Shower/Tub: Teacher, early years/pre: Standard     Home Equipment: Conservation officer, nature (2 wheels)          Prior Functioning/Environment Prior Level of Function : Needs assist             Mobility Comments: Uses RW for ambulation, limited household ambulator ADLs Comments: needs assist, has private help 2 days/week per chart        OT Problem List: Decreased activity  tolerance;Impaired balance (sitting and/or standing)      OT Treatment/Interventions: Self-care/ADL training;DME and/or AE instruction;Therapeutic activities;Patient/family education;Balance training    OT Goals(Current goals can be found in the care plan section) Acute Rehab OT Goals Patient Stated Goal: to have BM OT Goal Formulation: With patient Time For Goal Achievement: 06/18/22 Potential to Achieve Goals: Good ADL Goals Pt Will Perform Grooming: with supervision;standing Pt Will Perform Lower Body Dressing: with min guard assist;sit to/from stand Pt Will Transfer to Toilet: with supervision;ambulating  OT Frequency: Min 2X/week    Co-evaluation              AM-PAC OT "6 Clicks" Daily Activity     Outcome Measure Help from another person eating meals?: A Little Help from another person taking care of personal grooming?: A Little Help from another person toileting, which includes using toliet, bedpan, or urinal?: A Little Help from another person bathing (including washing, rinsing, drying)?: A Little Help from another person to put on and taking off regular upper body clothing?: A Little Help from another person to put on and taking off regular lower body clothing?: A Little 6 Click Score: 18   End of Session Equipment Utilized During Treatment: Gait belt;Rolling walker (2 wheels) Nurse Communication: Mobility status  Activity Tolerance: Patient tolerated treatment well Patient left: in chair;with call bell/phone within reach;with chair alarm set;Other (  comment) (with fall pads in place on floor)  OT Visit Diagnosis: Other abnormalities of gait and mobility (R26.89);Muscle weakness (generalized) (M62.81)                Time: 3748-2707 OT Time Calculation (min): 26 min Charges:  OT General Charges $OT Visit: 1 Visit OT Evaluation $OT Eval Moderate Complexity: [place] OTR/L Acute Rehabilitation Services Office: [place] 06/04/2022, 9:34 AM

## 2022-06-04 NOTE — TOC Progression Note (Signed)
Transition of Care [facility]) - Initial/Assessment Note    Patient Details  Name: Maria [name] MRN: [id number] Date of Birth: [birth year]  Transition of Care [facility]) CM/SW Contact:    Milinda Antis, [place] Phone Number: 06/04/2022, 12:31 PM  Clinical Narrative:                 CSW informed by [facility] that patient's family is agreeable to SNF and prefer [facility] or [facility].  TOC completed SNF workup and will continue to follow.  Pending: bed offers    Barriers to Discharge: Continued Medical Work up   Patient Goals and CMS Choice Patient states their goals for this hospitalization and ongoing recovery are:: Daughter, [name] requesting SNF for rehab before returning home. CMS Medicare.gov Compare Post Acute Care list provided to:: Patient Choice offered to / list presented to : Adult Children  Expected Discharge Plan and Services     Discharge Planning Services: CM Consult   Living arrangements for the past 2 months: Single Family Home                                      Prior Living Arrangements/Services Living arrangements for the past 2 months: Single Family Home Lives with:: Self (Grandson spends the night.) Patient language and need for interpreter reviewed:: Yes Do you feel safe going back to the place where you live?: Yes      Need for Family Participation in Patient Care: Yes (Comment) Care giver support system in place?: Yes (comment) Current home services: DME (Cane, walker, w/c walk-in shower with seat, grab bars) Criminal Activity/Legal Involvement Pertinent to Current Situation/Hospitalization: No - Comment as needed  Activities of Daily Living Home Assistive Devices/Equipment: None ADL Screening (condition at time of admission) Patient's cognitive ability adequate to safely complete daily activities?: No Is the patient deaf or have difficulty hearing?: No Does the patient have difficulty seeing, even when wearing glasses/contacts?:  No Does the patient have difficulty concentrating, remembering, or making decisions?: Yes Patient able to express need for assistance with ADLs?: Yes Does the patient have difficulty dressing or bathing?: No Independently performs ADLs?: No Communication: Independent Dressing (OT): Needs assistance Is this a change from baseline?: Pre-admission baseline Grooming: Needs assistance Is this a change from baseline?: Pre-admission baseline Feeding: Independent Bathing: Needs assistance Is this a change from baseline?: Pre-admission baseline Toileting: Needs assistance Is this a change from baseline?: Pre-admission baseline In/Out Bed: Needs assistance Is this a change from baseline?: Pre-admission baseline Walks in Home: Needs assistance Is this a change from baseline?: Pre-admission baseline Does the patient have difficulty walking or climbing stairs?: Yes Weakness of Legs: None Weakness of Arms/Hands: None  Permission Sought/Granted Permission sought to share information with : Case Manager, Customer service manager, Family Supports Permission granted to share information with : Yes, Verbal Permission Granted              Emotional Assessment Appearance:: Appears stated age Attitude/Demeanor/Rapport: Lethargic, Gracious (Sleeping most of the time.) Affect (typically observed): Appropriate Orientation: : Oriented to Self, Oriented to Place Alcohol / Substance Use: Not Applicable Psych Involvement: No (comment)  Admission diagnosis:  Acute cystitis without hematuria [N30.00] Altered mental status, unspecified altered mental status type [J69.67] Acute metabolic encephalopathy [E93.81] Toxic metabolic encephalopathy [O17.5] Patient Active Problem List   Diagnosis Date Noted   Pneumonia of right lung due to infectious organism 06/03/2022  Alzheimer's dementia with behavioral disturbance ([place]) 06/03/2022   Prolonged QT interval 72/90/2111   Toxic metabolic  encephalopathy 55/20/8022   Acute metabolic encephalopathy 33/61/2244   Acute cystitis without hematuria 06/02/2022   Chronic diastolic CHF (congestive heart failure) ([place]) 06/02/2022   Mixed diabetic hyperlipidemia associated with type 2 diabetes mellitus ([place]) 06/02/2022   GERD without esophagitis 06/02/2022   At risk for falls 03/11/2021   Bilateral hearing loss 03/11/2021   Dementia with behavioral disturbance ([place]) 03/11/2021   Depressive disorder 03/11/2021   Pulmonary hypertension ([place]) 03/11/2021   Insomnia 12/23/2020   Dysphagia, unspecified 12/23/2020   Primary generalized (osteo)arthritis 12/15/2020   Occlusion and stenosis of unspecified carotid artery 09/13/2020   Long term (current) use of oral hypoglycemic drugs 09/13/2020   History of falling 09/13/2020   Retention of urine, unspecified 09/13/2020   Unspecified urinary incontinence 09/13/2020   Severe sepsis ([place]) 08/08/2020   Acute kidney injury ([place]) 08/08/2020   Acute UTI (urinary tract infection) 08/08/2020   Carotid atherosclerosis 05/29/2020   Multinodular goiter 05/29/2020   Multiple lacunar infarcts ([place]) 05/29/2020   Abnormal serum thyroid stimulating hormone (TSH) level 05/28/2020   Constipation 11/27/2019   Impaired cognition 11/27/2019   Elevated lactic acid level 10/17/2019   Generalized weakness 10/17/2019   Hypokalemia 10/17/2019   Dehydration 10/17/2019   Acute encephalopathy 10/16/2019   Heart block AV complete ([place]) 04/13/2019   Normocytic anemia 11/01/2018   Diverticulosis of colon 07/22/2018   Mixed hyperlipidemia 11/22/2017   Nephropathy due to secondary diabetes mellitus ([place]) 11/22/2017   Polyneuropathy due to type 2 diabetes mellitus ([place]) 06/30/2017   Acute acalculous cholecystitis 10/07/2016   Elevated troponin 10/07/2016   Lactic acidosis 10/07/2016   Sinus tachycardia 10/07/2016   Dyslipidemia 06/05/2013   Cardiac pacemaker 05/30/2013   Type 2 diabetes mellitus with diabetic  polyneuropathy, without long-term current use of insulin ([place]) 05/21/2013   Chest pain 05/20/2013   Essential hypertension 05/20/2013   PCP:  Audley Hose, MD Pharmacy:   [facility] [address], [place] [address] Phone: [phone] Fax: [phone]  [id number] - [place], [place] - [address] AT [address] [address] [place] [address] Phone: [phone] Fax: [phone]     Social Determinants of Health (SDOH) Interventions    Readmission Risk Interventions     No data to display

## 2022-06-04 NOTE — Progress Notes (Signed)
Progress Note   Patient: Maria [name]:[id number] DOB: November 24, 1923 DOA: 06/02/2022     1 DOS: the patient was seen and examined on 06/04/2022   Brief hospital course: 88 year old female with past medical history of Alzheimer Dementia, complete heart block (S/P PPM placement 2003 with generator change 11/1495), diastolic congestive heart failure (E[place] 09/2016 EF 50-55% with G1DD),  non-insulin-dependent diabetes mellitus type 2, hyperlipidemia, hypertension, prior stroke (10/2019), breast cancer (2015) who presents to [facility] emergency department with family reports of confusion and intermittent hallucinations.  On evaluation, pt was found to have UA suggestive of UTI.  Assessment and Plan: * Acute metabolic encephalopathy Patient presenting with a 1 week history of increasing lethargy confusion and intermittent visual hallucinations Patient has a known history of mild/moderate Alzheimer's dementia.  Work-up thus far has a urinalysis that is somewhat suggestive of a urinary tract infection in addition to patchy right lung infiltrates concerning for possible pneumonia.   Cont on empiric rocephin Urine and blood cx pending. Thus far, urine cx is inconclusive Clinically is improving   Acute cystitis without hematuria In the absence of any other definitive cause of the patient's confusion, slightly abnormal urinalysis possibly suggestive of a urinary tract infection may prove to be the culprit Urine cultures have been obtained. Results reviewed, seems inconclusive  Blood cultures neg thus far Cont empiric rocephin    Pneumonia of right lung due to infectious organism Hazy right-sided infiltrates noted on chest x-ray concerning for possible pneumonia Underlying infectious process such as pneumonia could be precipitating patient's acute metabolic encephalopathy Treating with intravenous antibiotics with ceftriaxone and doxycycline Obtaining blood cultures, inflammatory markers,  procalcitonin Cont supplemental O2   Chronic diastolic CHF (congestive heart failure) (HCC) No clinical evidence of cardiogenic volume overload Strict input and output monitoring Daily weights    Essential hypertension Cont losartan per home regimen BP stable and controlled    Type 2 diabetes mellitus with diabetic polyneuropathy, without long-term current use of insulin ([place]) Patient been placed on Accu-Cheks before every meal and nightly with sliding scale insulin Holding home regimen of oral hypoglycemics Hemoglobin A1C of 6.0    Alzheimer's dementia with behavioral disturbance ([place]) Longstanding known history of dementia and cognitive deficit noted Presence of dementia complicates assessment of patient's encephalopathy which has worsened patient's confusion dramatically Minimizing uncomfortable stimuli Minimizing mood altering and sedating agents Encouraging family to remain at bedside is much as possible Frequent redirection by staff Fall precautions PT/OT consulted, recs for SNF    GERD without esophagitis Continuing home regimen of daily PPI therapy.    Mixed diabetic hyperlipidemia associated with type 2 diabetes mellitus ([place]) Continuing home regimen of lipid lowering therapy.  Hypokalemia -corrected -Recheck bmet in AM  Hypomagnesemia -replaced -Recheck bmet in AM  Prolonged QTc -Presenting QTc 667 -Electrolytes corrected with repeat QTc 538 on repeat EKG      Subjective: Reports feeling better this AM  Physical Exam: Vitals:   06/03/22 1735 06/03/22 2154 06/04/22 0503 06/04/22 0929  BP: (!) 143/64 (!) 142/78 139/66 (!) 122/52  Pulse: 64 68 (!) 59 (!) 59  Resp: '18 18 16 16  '$ Temp: 98 F (36.7 C) 98.6 F (37 C) (!) 97.5 F (36.4 C) (!) 97.5 F (36.4 C)  TempSrc:    Oral  SpO2: 100% 100% 100% 100%  Weight:      Height:       General exam: Conversant, in no acute distress Respiratory system: normal chest rise, clear, no audible  wheezing Cardiovascular system: regular rhythm, s1-s2 Gastrointestinal system: Nondistended, nontender, pos BS Central nervous system: No seizures, no tremors Extremities: No cyanosis, no joint deformities Skin: No rashes, no pallor Psychiatry: Affect normal // no auditory hallucinations   Data Reviewed:  Labs reviewed: Na 138, K 4.2, Cr 0.85, Mg 2.4  Family Communication: Pt in room, family not at bedside  Disposition: Status is: Inpatient Continue inpatient stay because: Severity of illness  Planned Discharge Destination: Skilled nursing facility     Author: Marylu Lund, MD 06/04/2022 3:30 PM  For on call review [email or web address].

## 2022-06-04 NOTE — TOC Progression Note (Signed)
Transition of Care [facility]) - Initial/Assessment Note    Patient Details  Name: Maria [name] MRN: [id number] Date of Birth: 03/03/1924  [facility]) CM/SW Contact:    Milinda Antis, [place] Phone Number: 06/04/2022, 9:09 AM  Clinical Narrative:                 CSW received and acknowledged consult for SNF.  Once PT/OT assessments and recommendations are completed, the search for SNF can begin.   TOC will continue to follow.      Barriers to Discharge: Continued Medical Work up   Patient Goals and CMS Choice Patient states their goals for this hospitalization and ongoing recovery are:: Daughter, [name] requesting SNF for rehab before returning home. CMS Medicare.gov Compare Post Acute Care list provided to:: Patient Choice offered to / list presented to : Adult Children  Expected Discharge Plan and Services     Discharge Planning Services: CM Consult   Living arrangements for the past 2 months: Single Family Home                                      Prior Living Arrangements/Services Living arrangements for the past 2 months: Single Family Home Lives with:: Self (Grandson spends the night.) Patient language and need for interpreter reviewed:: Yes Do you feel safe going back to the place where you live?: Yes      Need for Family Participation in Patient Care: Yes (Comment) Care giver support system in place?: Yes (comment) Current home services: DME (Cane, walker, w/c walk-in shower with seat, grab bars) Criminal Activity/Legal Involvement Pertinent to Current Situation/Hospitalization: No - Comment as needed  Activities of Daily Living Home Assistive Devices/Equipment: None ADL Screening (condition at time of admission) Patient's cognitive ability adequate to safely complete daily activities?: No Is the patient deaf or have difficulty hearing?: No Does the patient have difficulty seeing, even when wearing glasses/contacts?: No Does the patient have  difficulty concentrating, remembering, or making decisions?: Yes Patient able to express need for assistance with ADLs?: Yes Does the patient have difficulty dressing or bathing?: No Independently performs ADLs?: No Communication: Independent Dressing (OT): Needs assistance Is this a change from baseline?: Pre-admission baseline Grooming: Needs assistance Is this a change from baseline?: Pre-admission baseline Feeding: Independent Bathing: Needs assistance Is this a change from baseline?: Pre-admission baseline Toileting: Needs assistance Is this a change from baseline?: Pre-admission baseline In/Out Bed: Needs assistance Is this a change from baseline?: Pre-admission baseline Walks in Home: Needs assistance Is this a change from baseline?: Pre-admission baseline Does the patient have difficulty walking or climbing stairs?: Yes Weakness of Legs: None Weakness of Arms/Hands: None  Permission Sought/Granted Permission sought to share information with : Case Manager, Customer service manager, Family Supports Permission granted to share information with : Yes, Verbal Permission Granted              Emotional Assessment Appearance:: Appears stated age Attitude/Demeanor/Rapport: Lethargic, Gracious (Sleeping most of the time.) Affect (typically observed): Appropriate Orientation: : Oriented to Self, Oriented to Place Alcohol / Substance Use: Not Applicable Psych Involvement: No (comment)  Admission diagnosis:  Acute cystitis without hematuria [N30.00] Altered mental status, unspecified altered mental status type [V78.58] Acute metabolic encephalopathy [I50.27] Toxic metabolic encephalopathy [X41.2] Patient Active Problem List   Diagnosis Date Noted   Pneumonia of right lung due to infectious organism 06/03/2022   Alzheimer's dementia  with behavioral disturbance ([place]) 06/03/2022   Prolonged QT interval 78/93/8101   Toxic metabolic encephalopathy 75/06/2584   Acute  metabolic encephalopathy 27/78/2423   Acute cystitis without hematuria 06/02/2022   Chronic diastolic CHF (congestive heart failure) ([place]) 06/02/2022   Mixed diabetic hyperlipidemia associated with type 2 diabetes mellitus ([place]) 06/02/2022   GERD without esophagitis 06/02/2022   At risk for falls 03/11/2021   Bilateral hearing loss 03/11/2021   Dementia with behavioral disturbance ([place]) 03/11/2021   Depressive disorder 03/11/2021   Pulmonary hypertension ([place]) 03/11/2021   Insomnia 12/23/2020   Dysphagia, unspecified 12/23/2020   Primary generalized (osteo)arthritis 12/15/2020   Occlusion and stenosis of unspecified carotid artery 09/13/2020   Long term (current) use of oral hypoglycemic drugs 09/13/2020   History of falling 09/13/2020   Retention of urine, unspecified 09/13/2020   Unspecified urinary incontinence 09/13/2020   Severe sepsis ([place]) 08/08/2020   Acute kidney injury ([place]) 08/08/2020   Acute UTI (urinary tract infection) 08/08/2020   Carotid atherosclerosis 05/29/2020   Multinodular goiter 05/29/2020   Multiple lacunar infarcts ([place]) 05/29/2020   Abnormal serum thyroid stimulating hormone (TSH) level 05/28/2020   Constipation 11/27/2019   Impaired cognition 11/27/2019   Elevated lactic acid level 10/17/2019   Generalized weakness 10/17/2019   Hypokalemia 10/17/2019   Dehydration 10/17/2019   Acute encephalopathy 10/16/2019   Heart block AV complete ([place]) 04/13/2019   Normocytic anemia 11/01/2018   Diverticulosis of colon 07/22/2018   Mixed hyperlipidemia 11/22/2017   Nephropathy due to secondary diabetes mellitus ([place]) 11/22/2017   Polyneuropathy due to type 2 diabetes mellitus ([place]) 06/30/2017   Acute acalculous cholecystitis 10/07/2016   Elevated troponin 10/07/2016   Lactic acidosis 10/07/2016   Sinus tachycardia 10/07/2016   Dyslipidemia 06/05/2013   Cardiac pacemaker 05/30/2013   Type 2 diabetes mellitus with diabetic polyneuropathy, without long-term  current use of insulin ([place]) 05/21/2013   Chest pain 05/20/2013   Essential hypertension 05/20/2013   PCP:  Audley Hose, MD Pharmacy:   [facility] [address], [place] [address] Phone: [phone] Fax: [phone]  [id number] - [place], [place] - [address] [place] [address] Phone: [phone] Fax: [phone]     Social Determinants of Health (SDOH) Interventions    Readmission Risk Interventions     No data to display

## 2022-06-04 NOTE — Evaluation (Signed)
Physical Therapy Evaluation Patient Details Name: TASHUNDA VANDEZANDE MRN: [id number] DOB: October 30, 1923 Today's Date: 06/04/2022  History of Present Illness  Patient is a 88 y/o female who presents on 9/20 with confusion and visual hallucinations. Admitted with acute metabolic encephalopathy secondary to possible UTI and PNA. PMH includes DM, HTN, severe pulmonary HTN, chronic hypoxic respiratory failure, complete heart block s/p PPM, breast ca (2015), macular degeneration, dementia.  Clinical Impression  Patient presents with generalized weakness, impaired balance, cognitive deficits and impaired mobility s/p above. Pt not the best historian due to hx of dementia but per chart pt has someone stay with her at night and has some assist with bathing from hired aide twice/week. Pt uses RW for ambulation PTA. Today, pt requires MIn A for bed mobility, transfers and short distance ambulation in room with use of RW for support. Family interested in SNF. Would benefit from SNF to maximize independence and mobility prior to return home. If family able to provide initial 24/7 supervision, pt may be able to progress to home with HHPT. Will follow acutely.       Recommendations for follow up therapy are one component of a multi-disciplinary discharge planning process, led by the attending physician.  Recommendations may be updated based on patient status, additional functional criteria and insurance authorization.  Follow Up Recommendations Skilled nursing-short term rehab (<3 hours/day) Can patient physically be transported by private vehicle: Yes    Assistance Recommended at Discharge Frequent or constant Supervision/Assistance  Patient can return home with the following  A little help with walking and/or transfers;A little help with bathing/dressing/bathroom;Assist for transportation;Direct supervision/assist for medications management;Assistance with cooking/housework;Direct supervision/assist for financial  management    Equipment Recommendations None recommended by PT  Recommendations for Other Services       Functional Status Assessment Patient has had a recent decline in their functional status and demonstrates the ability to make significant improvements in function in a reasonable and predictable amount of time.     Precautions / Restrictions Precautions Precautions: Fall Restrictions Weight Bearing Restrictions: No      Mobility  Bed Mobility Overal bed mobility: Needs Assistance Bed Mobility: Rolling, Sidelying to Sit Rolling: Min guard Sidelying to sit: Min assist       General bed mobility comments: minguard for sequencing and multimodal cues for reaching and rolling to get off bed pan and minA to progress trunk to upright posture sitting EOB    Transfers Overall transfer level: Needs assistance Equipment used: Rolling walker (2 wheels) Transfers: Sit to/from Stand, Bed to chair/wheelchair/BSC Sit to Stand: Min assist Stand pivot transfers: Min assist         General transfer comment: minA for cues for safe hand placement and to boost into standing. minA for stability with stand pivot transfer from EOB > BSC > recliner    Ambulation/Gait Ambulation/Gait assistance: Min assist Gait Distance (Feet): 5 Feet (+ 7') Assistive device: Rolling walker (2 wheels) Gait Pattern/deviations: Step-to pattern, Step-through pattern, Trunk flexed Gait velocity: decreased Gait velocity interpretation: <1.31 ft/sec, indicative of household ambulator   General Gait Details: SLow, mildy unsteady gait with RW for support, bil knee instability with buckling upon standing from [facility] but no more noted with gait.  Stairs            Wheelchair Mobility    Modified Rankin (Stroke Patients Only)       Balance Overall balance assessment: Needs assistance Sitting-balance support: No upper extremity supported, Feet supported Sitting balance-Leahy Scale: Fair  Standing  balance support: Bilateral upper extremity supported, During functional activity Standing balance-Leahy Scale: Poor Standing balance comment: reliant on BUE support on RW for stability                             Pertinent Vitals/Pain Pain Assessment Pain Assessment: No/denies pain    Home Living Family/patient expects to be discharged to:: Private residence Living Arrangements: Children Available Help at Discharge: Family;Available PRN/intermittently Type of Home: House Home Access: Level entry       Home Layout: One level Home Equipment: Conservation officer, nature (2 wheels)      Prior Function Prior Level of Function : Needs assist             Mobility Comments: Uses RW for ambulation, limited household ambulator ADLs Comments: needs assist, has private help 2 days/week per chart     Hand Dominance   Dominant Hand: Right    Extremity/Trunk Assessment   Upper Extremity Assessment Upper Extremity Assessment: Defer to OT evaluation    Lower Extremity Assessment Lower Extremity Assessment: Generalized weakness    Cervical / Trunk Assessment Cervical / Trunk Assessment: Normal  Communication   Communication: No difficulties  Cognition Arousal/Alertness: Awake/alert Behavior During Therapy: WFL for tasks assessed/performed Overall Cognitive Status: History of cognitive impairments - at baseline                                 General Comments: pt with hx of Alzheimer's at baseline, pt currently [facility] for tasks assessed.        General Comments General comments (skin integrity, edema, etc.): SP02 100% on RA    Exercises     Assessment/Plan    PT Assessment Patient needs continued PT services  PT Problem List Decreased strength;Decreased mobility;Decreased cognition;Decreased balance;Decreased activity tolerance;Decreased safety awareness       PT Treatment Interventions Therapeutic activities;Gait training;Therapeutic  exercise;Patient/family education;Balance training;Functional mobility training;Cognitive remediation    PT Goals (Current goals can be found in the Care Plan section)  Acute Rehab PT Goals Patient Stated Goal: to go to the bathroom PT Goal Formulation: With patient Time For Goal Achievement: 06/18/22 Potential to Achieve Goals: Good    Frequency Min 3X/week     Co-evaluation               AM-PAC PT "6 Clicks" Mobility  Outcome Measure Help needed turning from your back to your side while in a flat bed without using bedrails?: A Little Help needed moving from lying on your back to sitting on the side of a flat bed without using bedrails?: A Little Help needed moving to and from a bed to a chair (including a wheelchair)?: A Little Help needed standing up from a chair using your arms (e.g., wheelchair or bedside chair)?: A Little Help needed to walk in hospital room?: Total Help needed climbing 3-5 steps with a railing? : Total 6 Click Score: 14    End of Session Equipment Utilized During Treatment: Gait belt Activity Tolerance: Patient tolerated treatment well Patient left: in chair;with call bell/phone within reach;with chair alarm set Nurse Communication: Mobility status PT Visit Diagnosis: Muscle weakness (generalized) (M62.81);Difficulty in walking, not elsewhere classified (R26.2)    Time: 6546-5035 PT Time Calculation (min) (ACUTE ONLY): 26 min   Charges:   PT Evaluation $PT Eval Moderate Complexity: 1 Mod  Zettie Cooley, DPT Acute Rehabilitation Services Secure chat preferred Office [phone]     Marguarite Arbour A Sabra Heck 06/04/2022, 11:05 AM

## 2022-06-04 NOTE — NC FL2 (Signed)
[place] LEVEL OF CARE SCREENING TOOL     IDENTIFICATION  Patient Name: Maria [name] Birthdate: [birth year] Sex: female Admission Date (Current Location): 06/02/2022  [facility] and [place] Number:  Herbalist and Address:  [facility], [place] [address], [place], [place] [address]      Provider Number: [id number]  Attending Physician Name and Address:  Donne Hazel, MD  Relative Name and Phone Number:  Gwenlyn Saran (Daughter)   [phone]    Current Level of Care: Hospital Recommended Level of Care: [place] Prior Approval Number:    Date Approved/Denied:   PASRR Number: [id number]  Discharge Plan: SNF    Current Diagnoses: Patient Active Problem List   Diagnosis Date Noted   Pneumonia of right lung due to infectious organism 06/03/2022   Alzheimer's dementia with behavioral disturbance ([place]) 06/03/2022   Prolonged QT interval 41/63/8453   Toxic metabolic encephalopathy 64/68/0321   Acute metabolic encephalopathy 22/48/2500   Acute cystitis without hematuria 06/02/2022   Chronic diastolic CHF (congestive heart failure) ([place]) 06/02/2022   Mixed diabetic hyperlipidemia associated with type 2 diabetes mellitus ([place]) 06/02/2022   GERD without esophagitis 06/02/2022   At risk for falls 03/11/2021   Bilateral hearing loss 03/11/2021   Dementia with behavioral disturbance ([place]) 03/11/2021   Depressive disorder 03/11/2021   Pulmonary hypertension ([place]) 03/11/2021   Insomnia 12/23/2020   Dysphagia, unspecified 12/23/2020   Primary generalized (osteo)arthritis 12/15/2020   Occlusion and stenosis of unspecified carotid artery 09/13/2020   Long term (current) use of oral hypoglycemic drugs 09/13/2020   History of falling 09/13/2020   Retention of urine, unspecified 09/13/2020   Unspecified urinary incontinence 09/13/2020   Severe sepsis ([place]) 08/08/2020   Acute kidney injury ([place]) 08/08/2020   Acute UTI  (urinary tract infection) 08/08/2020   Carotid atherosclerosis 05/29/2020   Multinodular goiter 05/29/2020   Multiple lacunar infarcts (HCC) 05/29/2020   Abnormal serum thyroid stimulating hormone (TSH) level 05/28/2020   Constipation 11/27/2019   Impaired cognition 11/27/2019   Elevated lactic acid level 10/17/2019   Generalized weakness 10/17/2019   Hypokalemia 10/17/2019   Dehydration 10/17/2019   Acute encephalopathy 10/16/2019   Heart block AV complete ([place]) 04/13/2019   Normocytic anemia 11/01/2018   Diverticulosis of colon 07/22/2018   Mixed hyperlipidemia 11/22/2017   Nephropathy due to secondary diabetes mellitus ([place]) 11/22/2017   Polyneuropathy due to type 2 diabetes mellitus ([place]) 06/30/2017   Acute acalculous cholecystitis 10/07/2016   Elevated troponin 10/07/2016   Lactic acidosis 10/07/2016   Sinus tachycardia 10/07/2016   Dyslipidemia 06/05/2013   Cardiac pacemaker 05/30/2013   Type 2 diabetes mellitus with diabetic polyneuropathy, without long-term current use of insulin ([place]) 05/21/2013   Chest pain 05/20/2013   Essential hypertension 05/20/2013    Orientation RESPIRATION BLADDER Height & Weight     Self, Place  Normal, O2 (nasal cannula 2 L) Incontinent Weight: 130 lb 11.7 oz (59.3 kg) Height:  '5\' 4"'$  (162.6 cm)  BEHAVIORAL SYMPTOMS/MOOD NEUROLOGICAL BOWEL NUTRITION STATUS      Incontinent Diet (see d/c summary)  AMBULATORY STATUS COMMUNICATION OF NEEDS Skin   Extensive Assist Verbally Normal                       Personal Care Assistance Level of Assistance  Bathing, Feeding, Dressing Bathing Assistance: Limited assistance Feeding assistance: Limited assistance Dressing Assistance: Limited assistance     Functional Limitations Info  Sight, Hearing, Speech Sight  Info: Impaired Hearing Info: Adequate Speech Info: Adequate    SPECIAL CARE FACTORS FREQUENCY  PT (By licensed PT), OT (By licensed OT)     PT Frequency: 5x/ week OT Frequency:  5x/ week            Contractures Contractures Info: Not present    Additional Factors Info  Insulin Sliding Scale, Code Status, Allergies Code Status Info: DNR Allergies Info: Vancomycin   Insulin Sliding Scale Info: see d/c medication list       Current Medications (06/04/2022):  This is the current hospital active medication list Current Facility-Administered Medications  Medication Dose Route Frequency Provider Last Rate Last Admin   acetaminophen (TYLENOL) tablet 650 mg  650 mg Oral Q6H PRN Shalhoub, Sherryll Burger, MD       Or   acetaminophen (TYLENOL) suppository 650 mg  650 mg Rectal Q6H PRN Shalhoub, Sherryll Burger, MD       aspirin EC tablet 81 mg  81 mg Oral Daily Shalhoub, Sherryll Burger, MD   81 mg at 06/04/22 1004   cefTRIAXone (ROCEPHIN) 1 g in sodium chloride 0.9 % 100 mL IVPB  1 g Intravenous Q24H Vernelle Emerald, MD 200 mL/hr at 06/03/22 2128 1 g at 06/03/22 2128   cycloSPORINE (RESTASIS) 0.05 % ophthalmic emulsion 1 drop  1 drop Both Eyes Daily Shalhoub, Sherryll Burger, MD   1 drop at 06/04/22 1006   doxycycline (VIBRAMYCIN) 100 mg in sodium chloride 0.9 % 250 mL IVPB  100 mg Intravenous Q12H Vernelle Emerald, MD 125 mL/hr at 06/04/22 0552 100 mg at 06/04/22 0552   enoxaparin (LOVENOX) injection 40 mg  40 mg Subcutaneous Q24H Alvira Philips, RPH   40 mg at 06/04/22 1009   hydrALAZINE (APRESOLINE) injection 10 mg  10 mg Intravenous Q6H PRN Vernelle Emerald, MD   10 mg at 06/03/22 0000   insulin aspart (novoLOG) injection 0-15 Units  0-15 Units Subcutaneous TID AC & HS Shalhoub, Sherryll Burger, MD   3 Units at 06/03/22 1701   latanoprost (XALATAN) 0.005 % ophthalmic solution 1 drop  1 drop Both Eyes QHS Shalhoub, Sherryll Burger, MD   1 drop at 06/03/22 2130   LORazepam (ATIVAN) injection 0.5 mg  0.5 mg Intravenous Q4H PRN Donne Hazel, MD       losartan (COZAAR) tablet 50 mg  50 mg Oral Daily Shalhoub, Sherryll Burger, MD   50 mg at 06/04/22 1004   meloxicam (MOBIC) tablet 7.5 mg  7.5 mg Oral  Daily PRN Vernelle Emerald, MD       Oral care mouth rinse  15 mL Mouth Rinse PRN Shalhoub, Sherryll Burger, MD       pantoprazole (PROTONIX) EC tablet 40 mg  40 mg Oral Daily Shalhoub, Sherryll Burger, MD   40 mg at 06/04/22 1004   polyethylene glycol (MIRALAX / GLYCOLAX) packet 17 g  17 g Oral Daily PRN Shalhoub, Sherryll Burger, MD       polyvinyl alcohol (LIQUIFILM TEARS) 1.4 % ophthalmic solution 1 drop  1 drop Both Eyes Daily Shalhoub, Sherryll Burger, MD   1 drop at 06/04/22 1007   rosuvastatin (CRESTOR) tablet 5 mg  5 mg Oral Daily Shalhoub, Sherryll Burger, MD   5 mg at 06/04/22 1005   sodium chloride flush (NS) 0.9 % injection 3 mL  3 mL Intravenous Q12H Shalhoub, Sherryll Burger, MD   3 mL at 06/03/22 0003     Discharge Medications: Please see discharge summary for  a list of discharge medications.  Relevant Imaging Results:  Relevant Lab Results:   Additional Information SSN [id number]; 5'4" 130lbs  Breana Litts F Ransome Helwig, LCSWA

## 2022-06-05 DIAGNOSIS — N3000 Acute cystitis without hematuria: Secondary | ICD-10-CM | POA: Diagnosis not present

## 2022-06-05 DIAGNOSIS — G309 Alzheimer's disease, unspecified: Secondary | ICD-10-CM | POA: Diagnosis not present

## 2022-06-05 DIAGNOSIS — G9341 Metabolic encephalopathy: Secondary | ICD-10-CM | POA: Diagnosis not present

## 2022-06-05 DIAGNOSIS — I10 Essential (primary) hypertension: Secondary | ICD-10-CM | POA: Diagnosis not present

## 2022-06-05 LAB — CBC
HCT: 42.5 % (ref 36.0–46.0)
Hemoglobin: 14.2 g/dL (ref 12.0–15.0)
MCH: 28.7 pg (ref 26.0–34.0)
MCHC: 33.4 g/dL (ref 30.0–36.0)
MCV: 86 fL (ref 80.0–100.0)
Platelets: 247 10*3/uL (ref 150–400)
RBC: 4.94 MIL/uL (ref 3.87–5.11)
RDW: 13 % (ref 11.5–15.5)
WBC: 6.3 10*3/uL (ref 4.0–10.5)
nRBC: 0 % (ref 0.0–0.2)

## 2022-06-05 LAB — COMPREHENSIVE METABOLIC PANEL
ALT: 19 U/L (ref 0–44)
AST: 23 U/L (ref 15–41)
Albumin: 4 g/dL (ref 3.5–5.0)
Alkaline Phosphatase: 42 U/L (ref 38–126)
Anion gap: 13 (ref 5–15)
BUN: 9 mg/dL (ref 8–23)
CO2: 20 mmol/L — ABNORMAL LOW (ref 22–32)
Calcium: 9.3 mg/dL (ref 8.9–10.3)
Chloride: 106 mmol/L (ref 98–111)
Creatinine, Ser: 0.83 mg/dL (ref 0.44–1.00)
GFR, Estimated: >60 mL/min (ref >60)
Glucose, Bld: 177 mg/dL — ABNORMAL HIGH (ref 70–99)
Potassium: 3.6 mmol/L (ref 3.5–5.1)
Sodium: 139 mmol/L (ref 135–145)
Total Bilirubin: 1.6 mg/dL — ABNORMAL HIGH (ref 0.3–1.2)
Total Protein: 7.4 g/dL (ref 6.5–8.1)

## 2022-06-05 LAB — GLUCOSE, CAPILLARY
Glucose-Capillary: 160 mg/dL — ABNORMAL HIGH (ref 70–99)
Glucose-Capillary: 210 mg/dL — ABNORMAL HIGH (ref 70–99)
Glucose-Capillary: 154 mg/dL — ABNORMAL HIGH (ref 70–99)

## 2022-06-05 NOTE — TOC Progression Note (Signed)
Transition of Care [facility]) - Progression Note    Patient Details  Name: Maria [name] MRN: [id number] Date of Birth: 1923/11/19  Transition of [facility]) CM/SW Contact  Emeterio Reeve, [place] Phone Number: 06/05/2022, 4:02 PM  Clinical Narrative:    CSW spoke to pt at bedside. CSW gave bed offers and left copy ratings list at bedside. Pt said she will discuss with family.     Barriers to Discharge: Continued Medical Work up  Expected Discharge Plan and Services     Discharge Planning Services: CM Consult   Living arrangements for the past 2 months: Single Family Home                                       Social Determinants of Health (SDOH) Interventions    Readmission Risk Interventions     No data to display         Emeterio Reeve, [employer] Social Worker

## 2022-06-05 NOTE — Progress Notes (Signed)
Progress Note   Patient: Maria [name] TDH:[id number] DOB: 07/06/1924 DOA: 06/02/2022     2 DOS: the patient was seen and examined on 06/05/2022   Brief hospital course: 88 year old female with past medical history of Alzheimer Dementia, complete heart block (S/P PPM placement 2003 with generator change 02/4679), diastolic congestive heart failure (Echo 09/2016 EF 50-55% with G1DD),  non-insulin-dependent diabetes mellitus type 2, hyperlipidemia, hypertension, prior stroke (10/2019), breast cancer (2015) who presents to [facility] emergency department with family reports of confusion and intermittent hallucinations.  On evaluation, pt was found to have UA suggestive of UTI.  Assessment and Plan: * Acute metabolic encephalopathy Patient presenting with a 1 week history of increasing lethargy confusion and intermittent visual hallucinations Patient has a known history of mild/moderate Alzheimer's dementia.  Work-up thus far has a urinalysis that is somewhat suggestive of a urinary tract infection in addition to patchy right lung infiltrates concerning for possible pneumonia.   Cont on empiric rocephin Urine and blood cx pending. Thus far, urine cx is inconclusive Clinically is improving RPR is pos with pos T.pallidum ab. Discussed with ID. Second confirmatory testing was sent out for PCP to check results per ID. ID recommends to repeat TTPA testing at follow up   Acute cystitis without hematuria In the absence of any other definitive cause of the patient's confusion, slightly abnormal urinalysis possibly suggestive of a urinary tract infection may prove to be the culprit Urine cultures have been obtained. Results reviewed, seems inconclusive  Blood cultures neg thus far Cont empiric rocephin    Pneumonia of right lung due to infectious organism Hazy right-sided infiltrates noted on chest x-ray concerning for possible pneumonia Underlying infectious process such as pneumonia could be  precipitating patient's acute metabolic encephalopathy Treating with intravenous antibiotics with ceftriaxone and doxycycline Weaned off O2   Chronic diastolic CHF (congestive heart failure) (HCC) No clinical evidence of cardiogenic volume overload Strict input and output monitoring Daily weights    Essential hypertension Cont losartan per home regimen BP stable and controlled    Type 2 diabetes mellitus with diabetic polyneuropathy, without long-term current use of insulin (HCC) Patient been placed on Accu-Cheks before every meal and nightly with sliding scale insulin Holding home regimen of oral hypoglycemics Hemoglobin A1C of 6.0  Glycemic trends stable   Alzheimer's dementia with behavioral disturbance ([place]) Longstanding known history of dementia and cognitive deficit noted Presence of dementia complicates assessment of patient's encephalopathy which has worsened patient's confusion dramatically Minimizing uncomfortable stimuli Minimizing mood altering and sedating agents Encouraging family to remain at bedside is much as possible Frequent redirection by staff Fall precautions PT/OT consulted, recs for SNF, TOC following    GERD without esophagitis Continuing home regimen of daily PPI therapy.    Mixed diabetic hyperlipidemia associated with type 2 diabetes mellitus ([place]) Continuing home regimen of lipid lowering therapy.  Hypokalemia -corrected -Recheck bmet in AM  Hypomagnesemia -within normal range -Recheck bmet in AM  Prolonged QTc -Presenting QTc 667 -Electrolytes corrected with repeat QTc 538 on repeat EKG      Subjective: In good spirits and pleasantly confused  Physical Exam: Vitals:   06/04/22 1753 06/04/22 2145 06/05/22 0520 06/05/22 1000  BP: 120/60 126/68 (!) 157/82 (!) 157/80  Pulse: 68 60 77 78  Resp: '18 18 18 17  '$ Temp: 98.4 F (36.9 C) 98.5 F (36.9 C) 98.4 F (36.9 C) 98.3 F (36.8 C)  TempSrc: Oral Oral Oral Oral  SpO2: 98% 99% 100%  100%  Weight:      Height:       General exam: Awake, laying in bed, in nad Respiratory system: Normal respiratory effort, no wheezing Cardiovascular system: regular rate, s1, s2 Gastrointestinal system: Soft, nondistended, positive BS Central nervous system: CN2-12 grossly intact, strength intact Extremities: Perfused, no clubbing Skin: Normal skin turgor, no notable skin lesions seen Psychiatry: Mood normal // no visual hallucinations   Data Reviewed:  Labs reviewed: Na 139, K 3.6, Cr 0.83  Family Communication: Pt in room, family not at bedside  Disposition: Status is: Inpatient Continue inpatient stay because: Severity of illness  Planned Discharge Destination: Skilled nursing facility     Author: Marylu Lund, MD 06/05/2022 1:41 PM  For on call review [email or web address].

## 2022-06-06 DIAGNOSIS — G9341 Metabolic encephalopathy: Secondary | ICD-10-CM | POA: Diagnosis not present

## 2022-06-06 DIAGNOSIS — I5032 Chronic diastolic (congestive) heart failure: Secondary | ICD-10-CM | POA: Diagnosis not present

## 2022-06-06 DIAGNOSIS — R4182 Altered mental status, unspecified: Secondary | ICD-10-CM | POA: Diagnosis not present

## 2022-06-06 LAB — COMPREHENSIVE METABOLIC PANEL
ALT: 22 U/L (ref 0–44)
AST: 27 U/L (ref 15–41)
Albumin: 4.2 g/dL (ref 3.5–5.0)
Alkaline Phosphatase: 42 U/L (ref 38–126)
Anion gap: 14 (ref 5–15)
BUN: 8 mg/dL (ref 8–23)
CO2: 21 mmol/L — ABNORMAL LOW (ref 22–32)
Calcium: 9.2 mg/dL (ref 8.9–10.3)
Chloride: 104 mmol/L (ref 98–111)
Creatinine, Ser: 0.65 mg/dL (ref 0.44–1.00)
GFR, Estimated: >60 mL/min (ref >60)
Glucose, Bld: 123 mg/dL — ABNORMAL HIGH (ref 70–99)
Potassium: 3.3 mmol/L — ABNORMAL LOW (ref 3.5–5.1)
Sodium: 139 mmol/L (ref 135–145)
Total Bilirubin: 1.2 mg/dL (ref 0.3–1.2)
Total Protein: 7.7 g/dL (ref 6.5–8.1)

## 2022-06-06 LAB — GLUCOSE, CAPILLARY
Glucose-Capillary: 159 mg/dL — ABNORMAL HIGH (ref 70–99)
Glucose-Capillary: 128 mg/dL — ABNORMAL HIGH (ref 70–99)
Glucose-Capillary: 134 mg/dL — ABNORMAL HIGH (ref 70–99)

## 2022-06-06 LAB — CBC
HCT: 44.2 % (ref 36.0–46.0)
Hemoglobin: 14.9 g/dL (ref 12.0–15.0)
MCH: 28.8 pg (ref 26.0–34.0)
MCHC: 33.7 g/dL (ref 30.0–36.0)
MCV: 85.5 fL (ref 80.0–100.0)
Platelets: 230 10*3/uL (ref 150–400)
RBC: 5.17 MIL/uL — ABNORMAL HIGH (ref 3.87–5.11)
RDW: 12.9 % (ref 11.5–15.5)
WBC: 7.6 10*3/uL (ref 4.0–10.5)
nRBC: 0 % (ref 0.0–0.2)

## 2022-06-06 MED ORDER — POTASSIUM CHLORIDE CRYS ER 20 MEQ PO TBCR
40.0000 meq | EXTENDED_RELEASE_TABLET | ORAL | Status: AC
  Administered 2022-06-06 (×2): 40 meq via ORAL
  Filled 2022-06-06: qty 2

## 2022-06-06 NOTE — Progress Notes (Signed)
Progress Note   Patient: Maria [name] XTG:[id number] DOB: 01/31/1924 DOA: 06/02/2022     3 DOS: the patient was seen and examined on 06/06/2022   Brief hospital course: 88 year old female with past medical history of Alzheimer Dementia, complete heart block (S/P PPM placement 2003 with generator change 10/7033), diastolic congestive heart failure (Echo 09/2016 EF 50-55% with G1DD),  non-insulin-dependent diabetes mellitus type 2, hyperlipidemia, hypertension, prior stroke (10/2019), breast cancer (2015) who presents to [facility] emergency department with family reports of confusion and intermittent hallucinations.  On evaluation, pt was found to have UA suggestive of UTI.  Assessment and Plan: * Acute metabolic encephalopathy Patient presenting with a 1 week history of increasing lethargy confusion and intermittent visual hallucinations Patient has a known history of mild/moderate Alzheimer's dementia.  Work-up thus far has a urinalysis that is somewhat suggestive of a urinary tract infection in addition to patchy right lung infiltrates concerning for possible pneumonia.   Cont on empiric rocephin Urine and blood cx pending. Thus far, urine cx is inconclusive Clinically improved RPR is pos with pos T.pallidum ab. Discussed with ID. Second confirmatory testing was sent out for PCP to check results per ID. ID recommends to repeat TTPA testing at follow up. Per ID, possible false pos test. Family reports no prior hx of painless ulcers   Acute cystitis without hematuria In the absence of any other definitive cause of the patient's confusion, slightly abnormal urinalysis possibly suggestive of a urinary tract infection may prove to be the culprit Urine cultures have been obtained. Results reviewed, seems inconclusive  Blood cultures neg thus far Cont empiric rocephin    Pneumonia of right lung due to infectious organism Hazy right-sided infiltrates noted on chest x-ray concerning for  possible pneumonia Underlying infectious process such as pneumonia could be precipitating patient's acute metabolic encephalopathy Treating with intravenous antibiotics with ceftriaxone and doxycycline Weaned off O2, stable on RA   Chronic diastolic CHF (congestive heart failure) (HCC) No clinical evidence of cardiogenic volume overload Strict input and output monitoring Daily weights    Essential hypertension Cont losartan per home regimen BP stable and controlled    Type 2 diabetes mellitus with diabetic polyneuropathy, without long-term current use of insulin (HCC) Patient been placed on Accu-Cheks before every meal and nightly with sliding scale insulin Holding home regimen of oral hypoglycemics Hemoglobin A1C of 6.0  Glycemic trends stable   Alzheimer's dementia with behavioral disturbance ([place]) Longstanding known history of dementia and cognitive deficit noted Presence of dementia complicates assessment of patient's encephalopathy which has worsened patient's confusion dramatically Minimizing uncomfortable stimuli Minimizing mood altering and sedating agents Encouraging family to remain at bedside is much as possible Frequent redirection by staff Fall precautions PT/OT consulted, recs for SNF, TOC following    GERD without esophagitis Continuing home regimen of daily PPI therapy.    Mixed diabetic hyperlipidemia associated with type 2 diabetes mellitus ([place]) Continuing home regimen of lipid lowering therapy.  Hypokalemia -Will replace -Recheck bmet in AM  Hypomagnesemia -within normal range -Recheck bmet in AM  Prolonged QTc -Presenting QTc 667 -Electrolytes corrected with repeat QTc 538 on repeat EKG      Subjective: Pleasantly confused without complaints  Physical Exam: Vitals:   06/05/22 1628 06/05/22 2052 06/06/22 0537 06/06/22 0918  BP: (!) 165/65 (!) 159/91 (!) 132/56 137/79  Pulse: 73 95 98 78  Resp: '18 17 18 18  '$ Temp: 98 F (36.7 C) 98.4 F  (36.9 C) 98.2 F (36.8 C)  97.8 F (36.6 C)  TempSrc:   Oral Oral  SpO2: 100% 99% 97% 100%  Weight:      Height:       General exam: Conversant, in no acute distress Respiratory system: normal chest rise, clear, no audible wheezing Cardiovascular system: regular rhythm, s1-s2 Gastrointestinal system: Nondistended, nontender, pos BS Central nervous system: No seizures, no tremors Extremities: No cyanosis, no joint deformities Skin: No rashes, no pallor Psychiatry: Affect normal // no auditory hallucinations   Data Reviewed:  Labs reviewed: Na 139, K 3.3, Cr 0.65  Family Communication: Pt in room, family not at bedside  Disposition: Status is: Inpatient Continue inpatient stay because: Severity of illness  Planned Discharge Destination: Skilled nursing facility     Author: Marylu Lund, MD 06/06/2022 2:44 PM  For on call review [email or web address].

## 2022-06-07 DIAGNOSIS — R4182 Altered mental status, unspecified: Secondary | ICD-10-CM | POA: Diagnosis not present

## 2022-06-07 DIAGNOSIS — H9193 Unspecified hearing loss, bilateral: Secondary | ICD-10-CM | POA: Diagnosis not present

## 2022-06-07 DIAGNOSIS — I10 Essential (primary) hypertension: Secondary | ICD-10-CM | POA: Diagnosis not present

## 2022-06-07 DIAGNOSIS — I6381 Other cerebral infarction due to occlusion or stenosis of small artery: Secondary | ICD-10-CM | POA: Diagnosis not present

## 2022-06-07 DIAGNOSIS — L22 Diaper dermatitis: Secondary | ICD-10-CM | POA: Diagnosis not present

## 2022-06-07 DIAGNOSIS — R5381 Other malaise: Secondary | ICD-10-CM | POA: Diagnosis not present

## 2022-06-07 DIAGNOSIS — R110 Nausea: Secondary | ICD-10-CM | POA: Diagnosis not present

## 2022-06-07 DIAGNOSIS — K5904 Chronic idiopathic constipation: Secondary | ICD-10-CM | POA: Diagnosis not present

## 2022-06-07 DIAGNOSIS — Z79899 Other long term (current) drug therapy: Secondary | ICD-10-CM | POA: Diagnosis not present

## 2022-06-07 DIAGNOSIS — D649 Anemia, unspecified: Secondary | ICD-10-CM | POA: Diagnosis not present

## 2022-06-07 DIAGNOSIS — M19011 Primary osteoarthritis, right shoulder: Secondary | ICD-10-CM | POA: Diagnosis not present

## 2022-06-07 DIAGNOSIS — J986 Disorders of diaphragm: Secondary | ICD-10-CM | POA: Diagnosis not present

## 2022-06-07 DIAGNOSIS — M160 Bilateral primary osteoarthritis of hip: Secondary | ICD-10-CM | POA: Diagnosis not present

## 2022-06-07 DIAGNOSIS — H353130 Nonexudative age-related macular degeneration, bilateral, stage unspecified: Secondary | ICD-10-CM | POA: Diagnosis not present

## 2022-06-07 DIAGNOSIS — M6281 Muscle weakness (generalized): Secondary | ICD-10-CM | POA: Diagnosis not present

## 2022-06-07 DIAGNOSIS — R262 Difficulty in walking, not elsewhere classified: Secondary | ICD-10-CM | POA: Diagnosis not present

## 2022-06-07 DIAGNOSIS — N3000 Acute cystitis without hematuria: Secondary | ICD-10-CM | POA: Diagnosis not present

## 2022-06-07 DIAGNOSIS — M25552 Pain in left hip: Secondary | ICD-10-CM | POA: Diagnosis not present

## 2022-06-07 DIAGNOSIS — E876 Hypokalemia: Secondary | ICD-10-CM | POA: Diagnosis not present

## 2022-06-07 DIAGNOSIS — M19012 Primary osteoarthritis, left shoulder: Secondary | ICD-10-CM | POA: Diagnosis not present

## 2022-06-07 DIAGNOSIS — L853 Xerosis cutis: Secondary | ICD-10-CM | POA: Diagnosis not present

## 2022-06-07 DIAGNOSIS — M5136 Other intervertebral disc degeneration, lumbar region: Secondary | ICD-10-CM | POA: Diagnosis not present

## 2022-06-07 DIAGNOSIS — I5032 Chronic diastolic (congestive) heart failure: Secondary | ICD-10-CM | POA: Diagnosis not present

## 2022-06-07 DIAGNOSIS — F02818 Dementia in other diseases classified elsewhere, unspecified severity, with other behavioral disturbance: Secondary | ICD-10-CM | POA: Diagnosis not present

## 2022-06-07 DIAGNOSIS — E782 Mixed hyperlipidemia: Secondary | ICD-10-CM | POA: Diagnosis not present

## 2022-06-07 DIAGNOSIS — A539 Syphilis, unspecified: Secondary | ICD-10-CM | POA: Diagnosis not present

## 2022-06-07 DIAGNOSIS — J168 Pneumonia due to other specified infectious organisms: Secondary | ICD-10-CM | POA: Diagnosis not present

## 2022-06-07 DIAGNOSIS — Z114 Encounter for screening for human immunodeficiency virus [HIV]: Secondary | ICD-10-CM | POA: Diagnosis not present

## 2022-06-07 DIAGNOSIS — R55 Syncope and collapse: Secondary | ICD-10-CM | POA: Diagnosis not present

## 2022-06-07 DIAGNOSIS — I442 Atrioventricular block, complete: Secondary | ICD-10-CM | POA: Diagnosis not present

## 2022-06-07 DIAGNOSIS — K219 Gastro-esophageal reflux disease without esophagitis: Secondary | ICD-10-CM | POA: Diagnosis not present

## 2022-06-07 DIAGNOSIS — I272 Pulmonary hypertension, unspecified: Secondary | ICD-10-CM | POA: Diagnosis not present

## 2022-06-07 DIAGNOSIS — A530 Latent syphilis, unspecified as early or late: Secondary | ICD-10-CM | POA: Diagnosis not present

## 2022-06-07 DIAGNOSIS — R410 Disorientation, unspecified: Secondary | ICD-10-CM | POA: Diagnosis not present

## 2022-06-07 DIAGNOSIS — H814 Vertigo of central origin: Secondary | ICD-10-CM | POA: Diagnosis not present

## 2022-06-07 DIAGNOSIS — G928 Other toxic encephalopathy: Secondary | ICD-10-CM | POA: Diagnosis not present

## 2022-06-07 DIAGNOSIS — G9341 Metabolic encephalopathy: Secondary | ICD-10-CM | POA: Diagnosis not present

## 2022-06-07 DIAGNOSIS — E785 Hyperlipidemia, unspecified: Secondary | ICD-10-CM | POA: Diagnosis not present

## 2022-06-07 DIAGNOSIS — L299 Pruritus, unspecified: Secondary | ICD-10-CM | POA: Diagnosis not present

## 2022-06-07 DIAGNOSIS — R531 Weakness: Secondary | ICD-10-CM | POA: Diagnosis not present

## 2022-06-07 DIAGNOSIS — G308 Other Alzheimer's disease: Secondary | ICD-10-CM | POA: Diagnosis not present

## 2022-06-07 DIAGNOSIS — I209 Angina pectoris, unspecified: Secondary | ICD-10-CM | POA: Diagnosis not present

## 2022-06-07 DIAGNOSIS — R899 Unspecified abnormal finding in specimens from other organs, systems and tissues: Secondary | ICD-10-CM | POA: Diagnosis not present

## 2022-06-07 DIAGNOSIS — R300 Dysuria: Secondary | ICD-10-CM | POA: Diagnosis not present

## 2022-06-07 DIAGNOSIS — F0390 Unspecified dementia without behavioral disturbance: Secondary | ICD-10-CM | POA: Diagnosis not present

## 2022-06-07 DIAGNOSIS — K2970 Gastritis, unspecified, without bleeding: Secondary | ICD-10-CM | POA: Diagnosis not present

## 2022-06-07 DIAGNOSIS — E1169 Type 2 diabetes mellitus with other specified complication: Secondary | ICD-10-CM | POA: Diagnosis not present

## 2022-06-07 DIAGNOSIS — R109 Unspecified abdominal pain: Secondary | ICD-10-CM | POA: Diagnosis not present

## 2022-06-07 DIAGNOSIS — R5383 Other fatigue: Secondary | ICD-10-CM | POA: Diagnosis not present

## 2022-06-07 DIAGNOSIS — Z7984 Long term (current) use of oral hypoglycemic drugs: Secondary | ICD-10-CM | POA: Diagnosis not present

## 2022-06-07 DIAGNOSIS — B0089 Other herpesviral infection: Secondary | ICD-10-CM | POA: Diagnosis not present

## 2022-06-07 DIAGNOSIS — R1110 Vomiting, unspecified: Secondary | ICD-10-CM | POA: Diagnosis not present

## 2022-06-07 DIAGNOSIS — Z8673 Personal history of transient ischemic attack (TIA), and cerebral infarction without residual deficits: Secondary | ICD-10-CM | POA: Diagnosis not present

## 2022-06-07 DIAGNOSIS — G309 Alzheimer's disease, unspecified: Secondary | ICD-10-CM | POA: Diagnosis not present

## 2022-06-07 DIAGNOSIS — M79605 Pain in left leg: Secondary | ICD-10-CM | POA: Diagnosis not present

## 2022-06-07 DIAGNOSIS — M79604 Pain in right leg: Secondary | ICD-10-CM | POA: Diagnosis not present

## 2022-06-07 DIAGNOSIS — Z7289 Other problems related to lifestyle: Secondary | ICD-10-CM | POA: Diagnosis not present

## 2022-06-07 DIAGNOSIS — Z7401 Bed confinement status: Secondary | ICD-10-CM | POA: Diagnosis not present

## 2022-06-07 DIAGNOSIS — F064 Anxiety disorder due to known physiological condition: Secondary | ICD-10-CM | POA: Diagnosis not present

## 2022-06-07 DIAGNOSIS — E119 Type 2 diabetes mellitus without complications: Secondary | ICD-10-CM | POA: Diagnosis not present

## 2022-06-07 DIAGNOSIS — B372 Candidiasis of skin and nail: Secondary | ICD-10-CM | POA: Diagnosis not present

## 2022-06-07 DIAGNOSIS — R1084 Generalized abdominal pain: Secondary | ICD-10-CM | POA: Diagnosis not present

## 2022-06-07 DIAGNOSIS — R1111 Vomiting without nausea: Secondary | ICD-10-CM | POA: Diagnosis not present

## 2022-06-07 DIAGNOSIS — I959 Hypotension, unspecified: Secondary | ICD-10-CM | POA: Diagnosis not present

## 2022-06-07 DIAGNOSIS — N390 Urinary tract infection, site not specified: Secondary | ICD-10-CM | POA: Diagnosis not present

## 2022-06-07 DIAGNOSIS — K5900 Constipation, unspecified: Secondary | ICD-10-CM | POA: Diagnosis not present

## 2022-06-07 DIAGNOSIS — E1142 Type 2 diabetes mellitus with diabetic polyneuropathy: Secondary | ICD-10-CM | POA: Diagnosis not present

## 2022-06-07 DIAGNOSIS — I6529 Occlusion and stenosis of unspecified carotid artery: Secondary | ICD-10-CM | POA: Diagnosis not present

## 2022-06-07 DIAGNOSIS — R441 Visual hallucinations: Secondary | ICD-10-CM | POA: Diagnosis not present

## 2022-06-07 DIAGNOSIS — R1030 Lower abdominal pain, unspecified: Secondary | ICD-10-CM | POA: Diagnosis not present

## 2022-06-07 DIAGNOSIS — Z7982 Long term (current) use of aspirin: Secondary | ICD-10-CM | POA: Diagnosis not present

## 2022-06-07 LAB — COMPREHENSIVE METABOLIC PANEL
ALT: 20 U/L (ref 0–44)
AST: 20 U/L (ref 15–41)
Albumin: 3.7 g/dL (ref 3.5–5.0)
Alkaline Phosphatase: 40 U/L (ref 38–126)
Anion gap: 12 (ref 5–15)
BUN: 17 mg/dL (ref 8–23)
CO2: 22 mmol/L (ref 22–32)
Calcium: 9.3 mg/dL (ref 8.9–10.3)
Chloride: 108 mmol/L (ref 98–111)
Creatinine, Ser: 0.83 mg/dL (ref 0.44–1.00)
GFR, Estimated: >60 mL/min (ref >60)
Glucose, Bld: 123 mg/dL — ABNORMAL HIGH (ref 70–99)
Potassium: 4 mmol/L (ref 3.5–5.1)
Sodium: 142 mmol/L (ref 135–145)
Total Bilirubin: 1.6 mg/dL — ABNORMAL HIGH (ref 0.3–1.2)
Total Protein: 6.8 g/dL (ref 6.5–8.1)

## 2022-06-07 LAB — CBC
HCT: 42.6 % (ref 36.0–46.0)
Hemoglobin: 14.3 g/dL (ref 12.0–15.0)
MCH: 28.9 pg (ref 26.0–34.0)
MCHC: 33.6 g/dL (ref 30.0–36.0)
MCV: 86.2 fL (ref 80.0–100.0)
Platelets: 236 10*3/uL (ref 150–400)
RBC: 4.94 MIL/uL (ref 3.87–5.11)
RDW: 13.1 % (ref 11.5–15.5)
WBC: 6.7 10*3/uL (ref 4.0–10.5)
nRBC: 0 % (ref 0.0–0.2)

## 2022-06-07 LAB — GLUCOSE, CAPILLARY
Glucose-Capillary: 97 mg/dL (ref 70–99)
Glucose-Capillary: 197 mg/dL — ABNORMAL HIGH (ref 70–99)

## 2022-06-07 MED ORDER — CEFDINIR 300 MG PO CAPS: 300.0000 mg | ORAL_CAPSULE | Freq: Two times a day (BID) | ORAL | 0 refills | Status: AC

## 2022-06-07 MED ORDER — DOXYCYCLINE HYCLATE 100 MG PO TABS: 100.0000 mg | ORAL_TABLET | Freq: Two times a day (BID) | ORAL | 0 refills | Status: AC

## 2022-06-07 NOTE — TOC Progression Note (Addendum)
Transition of Care [facility]) - Initial/Assessment Note    Patient Details  Name: Maria [name] MRN: [id number] Date of Birth: 12-06-1923  Transition of Care [facility]) CM/SW Contact:    Milinda Antis, [place] Phone Number: 06/07/2022, 10:01 AM  Clinical Narrative:                 10:00-  CSW contacted admissions at [facility] care to inquire about whether the facility can accept the patient today and is awaiting a response.    10:11-  CSW contacted by admissions at [facility] and informed that the patient can admit today.  Care team and patient's daughter, Maria [name], notified.      Barriers to Discharge: Continued Medical Work up   Patient Goals and CMS Choice Patient states their goals for this hospitalization and ongoing recovery are:: Daughter, Maria [name] requesting SNF for rehab before returning home. CMS Medicare.gov Compare Post Acute Care list provided to:: Patient Choice offered to / list presented to : Adult Children  Expected Discharge Plan and Services     Discharge Planning Services: CM Consult   Living arrangements for the past 2 months: Single Family Home                                      Prior Living Arrangements/Services Living arrangements for the past 2 months: Single Family Home Lives with:: Self (Grandson spends the night.) Patient language and need for interpreter reviewed:: Yes Do you feel safe going back to the place where you live?: Yes      Need for Family Participation in Patient Care: Yes (Comment) Care giver support system in place?: Yes (comment) Current home services: DME (Cane, walker, w/c walk-in shower with seat, grab bars) Criminal Activity/Legal Involvement Pertinent to Current Situation/Hospitalization: No - Comment as needed  Activities of Daily Living Home Assistive Devices/Equipment: None ADL Screening (condition at time of admission) Patient's cognitive ability adequate to safely complete daily activities?: No Is the patient  deaf or have difficulty hearing?: No Does the patient have difficulty seeing, even when wearing glasses/contacts?: No Does the patient have difficulty concentrating, remembering, or making decisions?: Yes Patient able to express need for assistance with ADLs?: Yes Does the patient have difficulty dressing or bathing?: No Independently performs ADLs?: No Communication: Independent Dressing (OT): Needs assistance Is this a change from baseline?: Pre-admission baseline Grooming: Needs assistance Is this a change from baseline?: Pre-admission baseline Feeding: Independent Bathing: Needs assistance Is this a change from baseline?: Pre-admission baseline Toileting: Needs assistance Is this a change from baseline?: Pre-admission baseline In/Out Bed: Needs assistance Is this a change from baseline?: Pre-admission baseline Walks in Home: Needs assistance Is this a change from baseline?: Pre-admission baseline Does the patient have difficulty walking or climbing stairs?: Yes Weakness of Legs: None Weakness of Arms/Hands: None  Permission Sought/Granted Permission sought to share information with : Case Manager, Customer service manager, Family Supports Permission granted to share information with : Yes, Verbal Permission Granted              Emotional Assessment Appearance:: Appears stated age Attitude/Demeanor/Rapport: Lethargic, Gracious (Sleeping most of the time.) Affect (typically observed): Appropriate Orientation: : Oriented to Self, Oriented to Place Alcohol / Substance Use: Not Applicable Psych Involvement: No (comment)  Admission diagnosis:  Acute cystitis without hematuria [N30.00] Altered mental status, unspecified altered mental status type [C37.62] Acute metabolic encephalopathy [G31.51] Toxic metabolic encephalopathy [  G92.8] Patient Active Problem List   Diagnosis Date Noted   Pneumonia of right lung due to infectious organism 06/03/2022   Alzheimer's  dementia with behavioral disturbance ([place]) 06/03/2022   Prolonged QT interval 31/59/4585   Toxic metabolic encephalopathy 92/92/4462   Acute metabolic encephalopathy 86/38/1771   Acute cystitis without hematuria 06/02/2022   Chronic diastolic CHF (congestive heart failure) ([place]) 06/02/2022   Mixed diabetic hyperlipidemia associated with type 2 diabetes mellitus ([place]) 06/02/2022   GERD without esophagitis 06/02/2022   At risk for falls 03/11/2021   Bilateral hearing loss 03/11/2021   Dementia with behavioral disturbance ([place]) 03/11/2021   Depressive disorder 03/11/2021   Pulmonary hypertension ([place]) 03/11/2021   Insomnia 12/23/2020   Dysphagia, unspecified 12/23/2020   Primary generalized (osteo)arthritis 12/15/2020   Occlusion and stenosis of unspecified carotid artery 09/13/2020   Long term (current) use of oral hypoglycemic drugs 09/13/2020   History of falling 09/13/2020   Retention of urine, unspecified 09/13/2020   Unspecified urinary incontinence 09/13/2020   Severe sepsis ([place]) 08/08/2020   Acute kidney injury ([place]) 08/08/2020   Acute UTI (urinary tract infection) 08/08/2020   Carotid atherosclerosis 05/29/2020   Multinodular goiter 05/29/2020   Multiple lacunar infarcts ([place]) 05/29/2020   Abnormal serum thyroid stimulating hormone (TSH) level 05/28/2020   Constipation 11/27/2019   Impaired cognition 11/27/2019   Elevated lactic acid level 10/17/2019   Generalized weakness 10/17/2019   Hypokalemia 10/17/2019   Dehydration 10/17/2019   Acute encephalopathy 10/16/2019   Heart block AV complete ([place]) 04/13/2019   Normocytic anemia 11/01/2018   Diverticulosis of colon 07/22/2018   Mixed hyperlipidemia 11/22/2017   Nephropathy due to secondary diabetes mellitus ([place]) 11/22/2017   Polyneuropathy due to type 2 diabetes mellitus ([place]) 06/30/2017   Acute acalculous cholecystitis 10/07/2016   Elevated troponin 10/07/2016   Lactic acidosis 10/07/2016   Sinus tachycardia  10/07/2016   Dyslipidemia 06/05/2013   Cardiac pacemaker 05/30/2013   Type 2 diabetes mellitus with diabetic polyneuropathy, without long-term current use of insulin ([place]) 05/21/2013   Chest pain 05/20/2013   Essential hypertension 05/20/2013   PCP:  Audley Hose, MD Pharmacy:   [facility] [address] [place] [address] Phone: [phone] Fax: [phone]  [id number] - [place], [place] - [address] [place] [address] Phone: [phone] Fax: [phone]     Social Determinants of Health (SDOH) Interventions    Readmission Risk Interventions     No data to display

## 2022-06-07 NOTE — Care Management Important Message (Signed)
Important Message  Patient Details  Name: Maria [name] MRN: [id number] Date of Birth: December 17, 1923   Medicare Important Message Given:  Yes     Orbie Pyo 06/07/2022, 2:47 PM

## 2022-06-07 NOTE — TOC Transition Note (Signed)
Transition of Care [facility]) - CM/SW Discharge Note   Patient Details  Name: Maria [name] MRN: [id number] Date of Birth: November 23, 1923  Transition of Care [facility]) CM/SW Contact:  Milinda Antis, [place] Phone Number: 06/07/2022, 1:41 PM   Clinical Narrative:    Patient will DC to: [place] date:  06/07/2022 Family notified:  Yes Transport by: Corey Harold   Per MD patient ready for DC to SNF. RN to call report prior to discharge [phone] room 106). RN, patient, patient's family, and facility notified of DC. Discharge Summary sent to facility. DC packet on chart. Ambulance transport will be requested for patient.   CSW will sign off for now as social work intervention is no longer needed. Please consult [place] again if new needs arise.     Final next level of care: Skilled Nursing Facility Barriers to Discharge: Barriers Resolved   Patient Goals and CMS Choice Patient states their goals for this hospitalization and ongoing recovery are:: Daughter, [name] requesting SNF for rehab before returning home. CMS Medicare.gov Compare Post Acute Care list provided to:: Patient Choice offered to / list presented to : Adult Children  Discharge Placement              Patient chooses bed at:  [facility]) Patient to be transferred to facility by: [place] Name of family member notified: Gwenlyn Saran (Daughter)   [phone] ( Patient and family notified of of transfer: 06/07/22  Discharge Plan and Services   Discharge Planning Services: CM Consult                                 Social Determinants of Health (SDOH) Interventions     Readmission Risk Interventions     No data to display

## 2022-06-07 NOTE — Consult Note (Signed)
   [facility] Inpatient Consult   06/07/2022  Maria [name] 1924-06-22 [id number]   [facility] [facility] [ACO] Patient: Medicare ACO REACH  Primary Care Provider:  Audley Hose, MD, [facility]  Patient screened for hospitalization to assess for potential [place] Management service needs for post hospital transition.  Review of patient's medical record reveals patient is being recommended for a skilled nursing level of care for post hospital needs. 1300 Spoke with the inpatient team regarding patient showing with a legal guardian, name listed without a phone contact.  Staff staff they have D/W daughter [name]. Went by the patient's room and spoke with patient and gentleman at the bedside states his name is "Maria [name]" her son and he states the caregiver and POA is [name], his sister and patient confirms this.  Call placed to [facility] listed and HIPPA provided.  Spoke with her regarding [place] [facility] follow up a the rehab facility and regarding patient with a legal guardian listed.  She states she has never heard the name "Judeth Horn" as there is no contact information provided. She states she is the POA. She states patient is for rehab at [facility] [a [facility] affiliated facility].    Reviewed [employer] for any documents, patient's ACP is listed for Advanced Directives.  Plan: If patient transitions to a [facility] affiliated facility then [facility] RN can follow for needs. Will alert [facility] RN PAC of above, if patient transitions to facility.  For questions contact:   Natividad Brood, RN BSN [facility] Liaison  [phone] business mobile phone Toll free office [phone]  Fax number: [phone] [email or web address]

## 2022-06-07 NOTE — Discharge Summary (Signed)
Physician Discharge Summary   Patient: Maria [name] MRN: [id number] DOB: 1924-07-31  Admit date:     06/02/2022  Discharge date: 06/07/22  Discharge Physician: Marylu Lund   PCP: Audley Hose, MD   Recommendations at discharge:    Follow up with PCP in 1-2 weeks Please follow up on second confirmatory testing for T.pallidum sent out 9/23 Per ID, repeat TTPA testing at follow up. Contact ID clinic for questions or issues  Discharge Diagnoses: Principal Problem:   Acute metabolic encephalopathy Active Problems:   Acute cystitis without hematuria   Pneumonia of right lung due to infectious organism   Chronic diastolic CHF (congestive heart failure) (HCC)   Essential hypertension   Prolonged QT interval   Type 2 diabetes mellitus with diabetic polyneuropathy, without long-term current use of insulin (HCC)   Alzheimer's dementia with behavioral disturbance ([place])   GERD without esophagitis   Mixed diabetic hyperlipidemia associated with type 2 diabetes mellitus ([place])   Toxic metabolic encephalopathy  Resolved Problems:   * No resolved hospital problems. *  Hospital Course: 88 year old female with past medical history of Alzheimer Dementia, complete heart block (S/P PPM placement 2003 with generator change 12/979), diastolic congestive heart failure (Echo 09/2016 EF 50-55% with G1DD),  non-insulin-dependent diabetes mellitus type 2, hyperlipidemia, hypertension, prior stroke (10/2019), breast cancer (2015) who presents to [facility] emergency department with family reports of confusion and intermittent hallucinations.  On evaluation, pt was found to have UA suggestive of UTI.  Assessment and Plan: * Acute metabolic encephalopathy Patient presenting with a 1 week history of increasing lethargy confusion and intermittent visual hallucinations Patient has a known history of mild/moderate Alzheimer's dementia.  Work-up thus far has a urinalysis that is somewhat suggestive  of a urinary tract infection in addition to patchy right lung infiltrates concerning for possible pneumonia.   Complete abx through 9/26 Urine and blood cx pending. Thus far, urine cx is inconclusive Clinically improved RPR is pos with pos T.pallidum ab. Discussed with ID. Second confirmatory testing was sent out for PCP to check results per ID. ID recommends to repeat TTPA testing at follow up. Per ID, possible false pos test. Family reports no prior hx of painless ulcers   Acute cystitis without hematuria In the absence of any other definitive cause of the patient's confusion, slightly abnormal urinalysis possibly suggestive of a urinary tract infection may prove to be the culprit Urine cultures have been obtained. Results reviewed, seems inconclusive  Blood cultures neg thus far Cont empiric abx, to complete 9/26   Pneumonia of right lung due to infectious organism Hazy right-sided infiltrates noted on chest x-ray concerning for possible pneumonia Underlying infectious process such as pneumonia could be precipitating patient's acute metabolic encephalopathy Given intravenous antibiotics with ceftriaxone and doxycycline while in hospital. Pt to complete PO abx course on 9/26 Weaned off O2, stable on RA   Chronic diastolic CHF (congestive heart failure) (HCC) No clinical evidence of cardiogenic volume overload Daily weights    Essential hypertension Cont losartan per home regimen BP stable and controlled    Type 2 diabetes mellitus with diabetic polyneuropathy, without long-term current use of insulin ([place]) Patient been placed on Accu-Cheks before every meal and nightly with sliding scale insulin Holding home regimen of oral hypoglycemics Hemoglobin A1C of 6.0  Glycemic trends stable   Alzheimer's dementia with behavioral disturbance ([place]) Longstanding known history of dementia and cognitive deficit noted Presence of dementia complicates assessment of patient's encephalopathy which  has  worsened patient's confusion dramatically Minimizing uncomfortable stimuli Minimizing mood altering and sedating agents Frequent redirection by staff Fall precautions PT/OT consulted, recs for SNF    GERD without esophagitis Continuing home regimen of daily PPI therapy.    Mixed diabetic hyperlipidemia associated with type 2 diabetes mellitus ([place]) Continuing home regimen of lipid lowering therapy.   Hypokalemia -replaced   Hypomagnesemia -within normal range   Prolonged QTc -Presenting QTc 667 -Electrolytes corrected with repeat QTc 538 on repeat EKG -Avoid QTc prolonging meds if possible       Consultants:  Procedures performed:   Disposition: Skilled nursing facility Diet recommendation:  Carb modified diet DISCHARGE MEDICATION: Allergies as of 06/07/2022       Reactions   Vancomycin Itching        Medication List     STOP taking these medications    QUEtiapine 25 MG tablet Commonly known as: SEROQUEL       TAKE these medications    acetaminophen 650 MG CR tablet Commonly known as: TYLENOL Take 1,300 mg by mouth in the morning and at bedtime.   aspirin EC 81 MG tablet Take 81 mg by mouth daily.   cefdinir 300 MG capsule Commonly known as: OMNICEF Take 1 capsule (300 mg total) by mouth 2 (two) times daily for 1 day.   donepezil 10 MG tablet Commonly known as: ARICEPT Take 10 mg by mouth at bedtime.   doxycycline 100 MG tablet Commonly known as: VIBRA-TABS Take 1 tablet (100 mg total) by mouth every 12 (twelve) hours for 1 day.   glimepiride 1 MG tablet Commonly known as: AMARYL Take 1 mg by mouth in the morning and at bedtime.   latanoprost 0.005 % ophthalmic solution Commonly known as: XALATAN Place 1 drop into both eyes at bedtime.   losartan 50 MG tablet Commonly known as: COZAAR Take 50 mg by mouth daily.   meclizine 25 MG tablet Commonly known as: ANTIVERT Take 25 mg by mouth 3 (three) times daily as needed (vertigo).    meloxicam 7.5 MG tablet Commonly known as: MOBIC Take 7.5 mg by mouth daily as needed (for arthritis pain).   metFORMIN 1000 MG tablet Commonly known as: GLUCOPHAGE Take 1,000 mg by mouth daily with breakfast.   nitroGLYCERIN 0.4 MG SL tablet Commonly known as: NITROSTAT Place 0.4 mg under the tongue every 5 (five) minutes as needed for chest pain.   OPTIVE 0.5-0.9 % ophthalmic solution Generic drug: carboxymethylcellul-glycerin Place 1 drop into both eyes daily.   Refresh Optive PF 0.5-0.9 % Soln Generic drug: Carboxymethylcell-Glycerin PF Place 1 drop into both eyes 2 (two) times daily as needed.   PRESERVISION AREDS 2+MULTI VIT PO Take 1 capsule by mouth daily.   Restasis 0.05 % ophthalmic emulsion Generic drug: cycloSPORINE Place 1 drop into both eyes daily.   rosuvastatin 5 MG tablet Commonly known as: CRESTOR Take 5 mg by mouth daily.   valACYclovir 1000 MG tablet Commonly known as: VALTREX Take 1,000 mg by mouth as needed (breakouts).        Follow-up Information     Bakare, Mobolaji B, MD Follow up in 1 week(s).   Specialty: Internal Medicine Why: Hospital follow up Contact information: [place] [address] [phone]         Evans Lance, MD .   Specialty: Cardiology Contact information: [phone] [address] [place] [address] [phone]         Evans Lance,  MD .   Specialty: Cardiology Contact information: [address] [place] [address] [phone]                Discharge Exam: Danley Danker Weights   06/02/22 2326  Weight: 59.3 kg   General exam: Awake, laying in bed, in nad Respiratory system: Normal respiratory effort, no wheezing Cardiovascular system: regular rate, s1, s2 Gastrointestinal system: Soft, nondistended, positive BS Central nervous system: CN2-12 grossly intact, strength intact Extremities: Perfused, no clubbing Skin: Normal skin  turgor, no notable skin lesions seen Psychiatry: Mood normal // no visual hallucinations   Condition at discharge: fair  The results of significant diagnostics from this hospitalization (including imaging, microbiology, ancillary and laboratory) are listed below for reference.   Imaging Studies: DG Chest 1 View  Result Date: 06/02/2022 CLINICAL DATA:  [id number].  Pneumonia follow-up. EXAM: CHEST  1 VIEW COMPARISON:  PA Lat 10/29/2021. FINDINGS: There is mild cardiomegaly without findings of CHF. No pleural effusion is seen. Left chest dual lead pacing system and wire insertions are unaltered. There is aortic atherosclerosis, mild aortic tortuosity with stable mediastinum. Right lower lung field again obscured by the chronically elevated right hemidiaphragm. There is questionable peripheral hazy opacity in the right upper lung field versus artifact related to technique. There is overlying monitor wiring. The remaining lungs are generally clear. There are degenerative changes in the spine. IMPRESSION: Questionable peripheral hazy infiltrate in the right upper lung field versus technique related artifact. Standing PA and lateral study in full inspiration recommended to see if this persists. No other appreciable acute chest findings. Cardiomegaly and aortic atherosclerosis. Chronically obscured right lower lung field due to the elevated hemidiaphragm. Electronically Signed   By: Telford Nab M.D.   On: 06/02/2022 23:23    Microbiology: Results for orders placed or performed during the hospital encounter of 06/02/22  SARS Coronavirus 2 by RT PCR (hospital order, performed in [facility] hospital lab) *cepheid single result test* Anterior Nasal Swab     Status: None   Collection Time: 06/02/22 11:12 PM   Specimen: Anterior Nasal Swab  Result Value Ref Range Status   SARS Coronavirus 2 by RT PCR NEGATIVE NEGATIVE Final    Comment: (NOTE) SARS-CoV-2 target nucleic acids are NOT DETECTED.  The SARS-CoV-2  RNA is generally detectable in upper and lower respiratory specimens during the acute phase of infection. The lowest concentration of SARS-CoV-2 viral copies this assay can detect is 250 copies / mL. A negative result does not preclude SARS-CoV-2 infection and should not be used as the sole basis for treatment or other patient management decisions.  A negative result may occur with improper specimen collection / handling, submission of specimen other than nasopharyngeal swab, presence of viral mutation(s) within the areas targeted by this assay, and inadequate number of viral copies (<250 copies / mL). A negative result must be combined with clinical observations, patient history, and epidemiological information.  Fact Sheet for Patients:   https://www.patel.info/  Fact Sheet for Healthcare Providers: https://hall.com/  This test is not yet approved or  cleared by the [place] FDA and has been authorized for detection and/or diagnosis of SARS-CoV-2 by FDA under an Emergency Use Authorization (EUA).  This EUA will remain in effect (meaning this test can be used) for the duration of the COVID-19 declaration under Section 564(b)(1) of the Act, 21 U.S.C. section 360bbb-3(b)(1), unless the authorization is terminated or revoked sooner.  Performed at [facility], [place]  [address] [place], [place] [address]   Urine Culture     Status: Abnormal   Collection Time: 06/03/22  6:59 AM   Specimen: Urine, Clean Catch  Result Value Ref Range Status   Specimen Description URINE, CLEAN CATCH  Final   Special Requests   Final    NONE Performed at [facility], [place] [address], [place], [place] [address]    Culture (A)  Final    40,000 COLONIES/mL MULTIPLE SPECIES PRESENT, SUGGEST RECOLLECTION   Report Status 06/04/2022 FINAL  Final  Culture, blood (Routine X 2) w Reflex to ID Panel     Status: None (Preliminary result)   Collection  Time: 06/03/22  8:15 AM   Specimen: BLOOD LEFT ARM  Result Value Ref Range Status   Specimen Description BLOOD LEFT ARM  Final   Special Requests   Final    BOTTLES DRAWN AEROBIC ONLY Blood Culture results may not be optimal due to an inadequate volume of blood received in culture bottles   Culture   Final    NO GROWTH 3 DAYS Performed at [facility], [place] [address], [place], [place] [address]    Report Status PENDING  Incomplete  Culture, blood (Routine X 2) w Reflex to ID Panel     Status: None (Preliminary result)   Collection Time: 06/03/22  8:26 AM   Specimen: BLOOD LEFT ARM  Result Value Ref Range Status   Specimen Description BLOOD LEFT ARM  Final   Special Requests   Final    BOTTLES DRAWN AEROBIC ONLY Blood Culture results may not be optimal due to an inadequate volume of blood received in culture bottles   Culture   Final    NO GROWTH 3 DAYS Performed at [facility], [place] [address], [place], [place] [address]    Report Status PENDING  Incomplete    Labs: CBC: Recent Labs  Lab 06/03/22 0813 06/04/22 0148 06/05/22 0807 06/06/22 1159 06/07/22 0805  WBC 7.6 7.3 6.3 7.6 6.7  NEUTROABS 4.1  --   --   --   --   HGB 15.0 13.8 14.2 14.9 14.3  HCT 44.3 40.9 42.5 44.2 42.6  MCV 85.2 85.4 86.0 85.5 86.2  PLT 254 228 247 230 782   Basic Metabolic Panel: Recent Labs  Lab 06/03/22 0813 06/04/22 0148 06/05/22 0807 06/06/22 1159 06/07/22 0805  NA 138 138 139 139 142  K 3.3* 4.2 3.6 3.3* 4.0  CL 101 104 106 104 108  CO2 23 24 20* 21* 22  GLUCOSE 122* 123* 177* 123* 123*  BUN '12 11 9 8 17  '$ CREATININE 0.71 0.85 0.83 0.65 0.83  CALCIUM 10.0 9.1 9.3 9.2 9.3  MG 1.4* 2.4  --   --   --    Liver Function Tests: Recent Labs  Lab 06/03/22 0813 06/04/22 0148 06/05/22 0807 06/06/22 1159 06/07/22 0805  AST '27 27 23 27 20  '$ ALT '21 20 19 22 20  '$ ALKPHOS 42 37* 42 42 40  BILITOT 1.5* 1.3* 1.6* 1.2 1.6*  PROT 8.3* 7.1 7.4 7.7 6.8  ALBUMIN 4.6 3.8 4.0 4.2  3.7   CBG: Recent Labs  Lab 06/06/22 0716 06/06/22 1134 06/06/22 1628 06/07/22 0724 06/07/22 1130  GLUCAP 159* 128* 134* 97 197*    Discharge time spent: less than 30 minutes.  Signed: Marylu Lund, MD [facility] 06/07/2022

## 2022-06-08 DIAGNOSIS — R441 Visual hallucinations: Secondary | ICD-10-CM | POA: Diagnosis not present

## 2022-06-08 DIAGNOSIS — E1142 Type 2 diabetes mellitus with diabetic polyneuropathy: Secondary | ICD-10-CM | POA: Diagnosis not present

## 2022-06-08 DIAGNOSIS — I5032 Chronic diastolic (congestive) heart failure: Secondary | ICD-10-CM | POA: Diagnosis not present

## 2022-06-08 DIAGNOSIS — G9341 Metabolic encephalopathy: Secondary | ICD-10-CM | POA: Diagnosis not present

## 2022-06-08 DIAGNOSIS — F02818 Dementia in other diseases classified elsewhere, unspecified severity, with other behavioral disturbance: Secondary | ICD-10-CM | POA: Diagnosis not present

## 2022-06-08 DIAGNOSIS — M6281 Muscle weakness (generalized): Secondary | ICD-10-CM | POA: Diagnosis not present

## 2022-06-08 DIAGNOSIS — E1169 Type 2 diabetes mellitus with other specified complication: Secondary | ICD-10-CM | POA: Diagnosis not present

## 2022-06-08 DIAGNOSIS — K219 Gastro-esophageal reflux disease without esophagitis: Secondary | ICD-10-CM | POA: Diagnosis not present

## 2022-06-08 DIAGNOSIS — E785 Hyperlipidemia, unspecified: Secondary | ICD-10-CM | POA: Diagnosis not present

## 2022-06-08 DIAGNOSIS — I10 Essential (primary) hypertension: Secondary | ICD-10-CM | POA: Diagnosis not present

## 2022-06-08 DIAGNOSIS — E782 Mixed hyperlipidemia: Secondary | ICD-10-CM | POA: Diagnosis not present

## 2022-06-08 LAB — CULTURE, BLOOD (ROUTINE X 2)
Culture: NO GROWTH
Culture: NO GROWTH

## 2022-06-08 LAB — T.PALLIDUM AB, TOTAL: T Pallidum Abs: REACTIVE — AB

## 2022-06-09 DIAGNOSIS — L853 Xerosis cutis: Secondary | ICD-10-CM | POA: Diagnosis not present

## 2022-06-10 ENCOUNTER — Other Ambulatory Visit: Payer: Self-pay | Admitting: *Deleted

## 2022-06-10 NOTE — Patient Outreach (Signed)
Maria [name] recently admitted to [facility] SNF. Screening for potential [facility] care coordination services as benefit of insurance plan and PCP.   Update received from [place] [place] SW indicating transition plan is to return home with family support.   Will continue to follow.    [name], MSN, RN,BSN [place] Acute Care Coordinator [phone] (Direct dial)

## 2022-06-11 DIAGNOSIS — E785 Hyperlipidemia, unspecified: Secondary | ICD-10-CM | POA: Diagnosis not present

## 2022-06-11 DIAGNOSIS — E1169 Type 2 diabetes mellitus with other specified complication: Secondary | ICD-10-CM | POA: Diagnosis not present

## 2022-06-11 DIAGNOSIS — G9341 Metabolic encephalopathy: Secondary | ICD-10-CM | POA: Diagnosis not present

## 2022-06-11 DIAGNOSIS — E1142 Type 2 diabetes mellitus with diabetic polyneuropathy: Secondary | ICD-10-CM | POA: Diagnosis not present

## 2022-06-11 DIAGNOSIS — F02818 Dementia in other diseases classified elsewhere, unspecified severity, with other behavioral disturbance: Secondary | ICD-10-CM | POA: Diagnosis not present

## 2022-06-11 DIAGNOSIS — K219 Gastro-esophageal reflux disease without esophagitis: Secondary | ICD-10-CM | POA: Diagnosis not present

## 2022-06-11 DIAGNOSIS — R441 Visual hallucinations: Secondary | ICD-10-CM | POA: Diagnosis not present

## 2022-06-11 DIAGNOSIS — I5032 Chronic diastolic (congestive) heart failure: Secondary | ICD-10-CM | POA: Diagnosis not present

## 2022-06-11 DIAGNOSIS — I10 Essential (primary) hypertension: Secondary | ICD-10-CM | POA: Diagnosis not present

## 2022-06-11 DIAGNOSIS — L853 Xerosis cutis: Secondary | ICD-10-CM | POA: Diagnosis not present

## 2022-06-11 DIAGNOSIS — M6281 Muscle weakness (generalized): Secondary | ICD-10-CM | POA: Diagnosis not present

## 2022-06-15 DIAGNOSIS — R441 Visual hallucinations: Secondary | ICD-10-CM | POA: Diagnosis not present

## 2022-06-15 DIAGNOSIS — L22 Diaper dermatitis: Secondary | ICD-10-CM | POA: Diagnosis not present

## 2022-06-15 DIAGNOSIS — G9341 Metabolic encephalopathy: Secondary | ICD-10-CM | POA: Diagnosis not present

## 2022-06-15 DIAGNOSIS — L853 Xerosis cutis: Secondary | ICD-10-CM | POA: Diagnosis not present

## 2022-06-15 DIAGNOSIS — R300 Dysuria: Secondary | ICD-10-CM | POA: Diagnosis not present

## 2022-06-16 DIAGNOSIS — R899 Unspecified abnormal finding in specimens from other organs, systems and tissues: Secondary | ICD-10-CM | POA: Diagnosis not present

## 2022-06-16 DIAGNOSIS — R410 Disorientation, unspecified: Secondary | ICD-10-CM | POA: Diagnosis not present

## 2022-06-16 DIAGNOSIS — R441 Visual hallucinations: Secondary | ICD-10-CM | POA: Diagnosis not present

## 2022-06-17 ENCOUNTER — Other Ambulatory Visit: Payer: Self-pay | Admitting: *Deleted

## 2022-06-17 DIAGNOSIS — R5383 Other fatigue: Secondary | ICD-10-CM | POA: Diagnosis not present

## 2022-06-17 DIAGNOSIS — F02818 Dementia in other diseases classified elsewhere, unspecified severity, with other behavioral disturbance: Secondary | ICD-10-CM | POA: Diagnosis not present

## 2022-06-17 DIAGNOSIS — R300 Dysuria: Secondary | ICD-10-CM | POA: Diagnosis not present

## 2022-06-17 DIAGNOSIS — R410 Disorientation, unspecified: Secondary | ICD-10-CM | POA: Diagnosis not present

## 2022-06-17 NOTE — Patient Outreach (Signed)
[facility] Post- Acute Care Coordinator follow up. Mrs. Thaker resides in [facility]. Screening for [facility] care coordination services as benefit of insurance plan and PCP.   Met with therapy manager and SNF social workers at [facility] on 06/16/22. Has some confusion. Has supportive family. Transition plan is to return home.   Will continue to follow.   [name], MSN, RN,BSN [place] Acute Care Coordinator [phone] (Direct dial)

## 2022-06-22 ENCOUNTER — Inpatient Hospital Stay: Payer: Medicare Other | Admitting: Internal Medicine

## 2022-06-22 DIAGNOSIS — M79605 Pain in left leg: Secondary | ICD-10-CM | POA: Diagnosis not present

## 2022-06-22 DIAGNOSIS — M79604 Pain in right leg: Secondary | ICD-10-CM | POA: Diagnosis not present

## 2022-06-22 DIAGNOSIS — L299 Pruritus, unspecified: Secondary | ICD-10-CM | POA: Diagnosis not present

## 2022-06-22 DIAGNOSIS — B372 Candidiasis of skin and nail: Secondary | ICD-10-CM | POA: Diagnosis not present

## 2022-06-23 ENCOUNTER — Other Ambulatory Visit: Payer: Self-pay | Admitting: *Deleted

## 2022-06-23 DIAGNOSIS — R5381 Other malaise: Secondary | ICD-10-CM | POA: Diagnosis not present

## 2022-06-23 DIAGNOSIS — M25552 Pain in left hip: Secondary | ICD-10-CM | POA: Diagnosis not present

## 2022-06-23 DIAGNOSIS — R262 Difficulty in walking, not elsewhere classified: Secondary | ICD-10-CM | POA: Diagnosis not present

## 2022-06-23 NOTE — Patient Outreach (Signed)
[facility] Post- Acute Care Coordinator follow up. Maria [name] resides in [facility] SNF. Screening for potential care coordination services as benefit of insurance plan and PCP.   Met with SNF SW and therapy manager at [facility]. Maria [name] is from home with family support. Family takes turns staying with her. Transition plan is to return home.   Went to bedside to speak with Maria [name] and son Maria [name] [facility] services. Sam states his sister Maria [name] is primary contact. Provided [facility] literature and writer's contact information.  Will continue to follow.   [name], MSN, RN,BSN [place] Acute Care Coordinator [phone] (Direct dial)

## 2022-06-24 ENCOUNTER — Other Ambulatory Visit: Payer: Self-pay

## 2022-06-24 ENCOUNTER — Emergency Department (HOSPITAL_COMMUNITY): Payer: Medicare Other

## 2022-06-24 ENCOUNTER — Inpatient Hospital Stay: Payer: Medicare Other | Admitting: Internal Medicine

## 2022-06-24 ENCOUNTER — Emergency Department (HOSPITAL_COMMUNITY)
Admission: EM | Admit: 2022-06-24 | Discharge: 2022-06-25 | Disposition: A | Discharge status: Skilled Nursing Facility | Payer: Medicare Other | Attending: Emergency Medicine | Admitting: Emergency Medicine

## 2022-06-24 DIAGNOSIS — R55 Syncope and collapse: Secondary | ICD-10-CM | POA: Diagnosis not present

## 2022-06-24 DIAGNOSIS — R4182 Altered mental status, unspecified: Secondary | ICD-10-CM | POA: Diagnosis not present

## 2022-06-24 DIAGNOSIS — R531 Weakness: Secondary | ICD-10-CM | POA: Diagnosis not present

## 2022-06-24 DIAGNOSIS — Z7984 Long term (current) use of oral hypoglycemic drugs: Secondary | ICD-10-CM | POA: Diagnosis not present

## 2022-06-24 DIAGNOSIS — Z7982 Long term (current) use of aspirin: Secondary | ICD-10-CM | POA: Insufficient documentation

## 2022-06-24 DIAGNOSIS — K2970 Gastritis, unspecified, without bleeding: Secondary | ICD-10-CM | POA: Diagnosis not present

## 2022-06-24 DIAGNOSIS — F0390 Unspecified dementia without behavioral disturbance: Secondary | ICD-10-CM | POA: Diagnosis not present

## 2022-06-24 DIAGNOSIS — R1030 Lower abdominal pain, unspecified: Secondary | ICD-10-CM | POA: Diagnosis not present

## 2022-06-24 DIAGNOSIS — E119 Type 2 diabetes mellitus without complications: Secondary | ICD-10-CM | POA: Diagnosis not present

## 2022-06-24 DIAGNOSIS — F02818 Dementia in other diseases classified elsewhere, unspecified severity, with other behavioral disturbance: Secondary | ICD-10-CM | POA: Diagnosis not present

## 2022-06-24 DIAGNOSIS — R1110 Vomiting, unspecified: Secondary | ICD-10-CM | POA: Insufficient documentation

## 2022-06-24 DIAGNOSIS — R109 Unspecified abdominal pain: Secondary | ICD-10-CM | POA: Diagnosis present

## 2022-06-24 DIAGNOSIS — I10 Essential (primary) hypertension: Secondary | ICD-10-CM | POA: Diagnosis not present

## 2022-06-24 LAB — URINALYSIS, ROUTINE W REFLEX MICROSCOPIC
Bacteria, UA: NONE SEEN
Bilirubin Urine: NEGATIVE
Glucose, UA: NEGATIVE mg/dL
Hgb urine dipstick: NEGATIVE
Ketones, ur: NEGATIVE mg/dL
Leukocytes,Ua: NEGATIVE
Nitrite: NEGATIVE
Protein, ur: 30 mg/dL — AB
Specific Gravity, Urine: 1.04 — ABNORMAL HIGH (ref 1.005–1.030)
pH: 5 (ref 5.0–8.0)

## 2022-06-24 LAB — COMPREHENSIVE METABOLIC PANEL
ALT: 7 U/L (ref 0–44)
AST: 22 U/L (ref 15–41)
Albumin: 4 g/dL (ref 3.5–5.0)
Alkaline Phosphatase: 36 U/L — ABNORMAL LOW (ref 38–126)
Anion gap: 14 (ref 5–15)
BUN: 12 mg/dL (ref 8–23)
CO2: 20 mmol/L — ABNORMAL LOW (ref 22–32)
Calcium: 9.2 mg/dL (ref 8.9–10.3)
Chloride: 105 mmol/L (ref 98–111)
Creatinine, Ser: 0.89 mg/dL (ref 0.44–1.00)
GFR, Estimated: 59 mL/min — ABNORMAL LOW (ref >60)
Glucose, Bld: 137 mg/dL — ABNORMAL HIGH (ref 70–99)
Potassium: 4.5 mmol/L (ref 3.5–5.1)
Sodium: 139 mmol/L (ref 135–145)
Total Bilirubin: 1.1 mg/dL (ref 0.3–1.2)
Total Protein: 7.1 g/dL (ref 6.5–8.1)

## 2022-06-24 LAB — CBC WITH DIFFERENTIAL/PLATELET
Abs Immature Granulocytes: 0.03 10*3/uL (ref 0.00–0.07)
Basophils Absolute: 0.1 10*3/uL (ref 0.0–0.1)
Basophils Relative: 1 %
Eosinophils Absolute: 0.1 10*3/uL (ref 0.0–0.5)
Eosinophils Relative: 1 %
HCT: 38.4 % (ref 36.0–46.0)
Hemoglobin: 12.4 g/dL (ref 12.0–15.0)
Immature Granulocytes: 0 %
Lymphocytes Relative: 22 %
Lymphs Abs: 1.9 10*3/uL (ref 0.7–4.0)
MCH: 28.6 pg (ref 26.0–34.0)
MCHC: 32.3 g/dL (ref 30.0–36.0)
MCV: 88.5 fL (ref 80.0–100.0)
Monocytes Absolute: 0.5 10*3/uL (ref 0.1–1.0)
Monocytes Relative: 5 %
Neutro Abs: 6.1 10*3/uL (ref 1.7–7.7)
Neutrophils Relative %: 71 %
Platelets: 281 10*3/uL (ref 150–400)
RBC: 4.34 MIL/uL (ref 3.87–5.11)
RDW: 13.9 % (ref 11.5–15.5)
WBC: 8.7 10*3/uL (ref 4.0–10.5)
nRBC: 0 % (ref 0.0–0.2)

## 2022-06-24 LAB — CBG MONITORING, ED: Glucose-Capillary: 133 mg/dL — ABNORMAL HIGH (ref 70–99)

## 2022-06-24 MED ORDER — ONDANSETRON 4 MG PO TBDP: ORAL_TABLET | ORAL | 0 refills | Status: DC

## 2022-06-24 MED ORDER — PANTOPRAZOLE SODIUM 20 MG PO TBEC: 20.0000 mg | DELAYED_RELEASE_TABLET | Freq: Every day | ORAL | 0 refills | Status: DC

## 2022-06-24 MED ORDER — PANTOPRAZOLE SODIUM 40 MG IV SOLR
40.0000 mg | Freq: Once | INTRAVENOUS | Status: AC
  Administered 2022-06-24: 40 mg via INTRAVENOUS
  Filled 2022-06-24: qty 10

## 2022-06-24 MED ORDER — IOHEXOL 350 MG/ML SOLN
65.0000 mL | Freq: Once | INTRAVENOUS | Status: AC | PRN
  Administered 2022-06-24: 65 mL via INTRAVENOUS

## 2022-06-24 MED ORDER — ONDANSETRON HCL 4 MG/2ML IJ SOLN
4.0000 mg | Freq: Once | INTRAMUSCULAR | Status: AC
  Administered 2022-06-24: 4 mg via INTRAVENOUS
  Filled 2022-06-24: qty 2

## 2022-06-24 NOTE — Discharge Instructions (Addendum)
You have gastritis.  Please take Zofran as needed for vomiting  You do not have a urine infection and your labs were normal today.  I have prescribed Protonix 20 mg daily.  Please call GI for follow-up  Return to ER if you have worse vomiting, abdominal pain

## 2022-06-24 NOTE — ED Provider Notes (Signed)
[facility] EMERGENCY DEPARTMENT Provider Note   CSN: [id number] Arrival date & time: 06/24/22  1427     History  Chief Complaint  Patient presents with   Weakness    Maria [name] is a 88 y.o. female history of dementia, recurrent UTI, diabetes here presenting with abdominal pain and vomiting.  Patient resides at the [facility] health care.  Patient was found to have vomit on her wheelchair.  Patient demented unable to give much history.  Patient was recently admitted for UTI.  The history is provided by the patient and the EMS personnel.       Home Medications Prior to Admission medications   Medication Sig Start Date End Date Taking? Authorizing Provider  acetaminophen (TYLENOL) 650 MG CR tablet Take 1,300 mg by mouth in the morning and at bedtime.    [provider]  aspirin EC 81 MG tablet Take 81 mg by mouth daily.    [provider]  carboxymethylcellul-glycerin (OPTIVE) 0.5-0.9 % ophthalmic solution Place 1 drop into both eyes daily.    [provider]  cycloSPORINE (RESTASIS) 0.05 % ophthalmic emulsion Place 1 drop into both eyes daily.     [provider]  donepezil (ARICEPT) 10 MG tablet Take 10 mg by mouth at bedtime.    [provider]  glimepiride (AMARYL) 1 MG tablet Take 1 mg by mouth in the morning and at bedtime. 10/23/19   [provider]  latanoprost (XALATAN) 0.005 % ophthalmic solution Place 1 drop into both eyes at bedtime. 06/10/20   [provider]  losartan (COZAAR) 50 MG tablet Take 50 mg by mouth daily. 05/26/21   [provider]  meclizine (ANTIVERT) 25 MG tablet Take 25 mg by mouth 3 (three) times daily as needed (vertigo).     [provider]  meloxicam (MOBIC) 7.5 MG tablet Take 7.5 mg by mouth daily as needed (for arthritis pain).    [provider]  metFORMIN (GLUCOPHAGE) 1000 MG tablet Take 1,000 mg by mouth daily with breakfast. 01/08/21    [provider]  Multiple Vitamins-Minerals (PRESERVISION AREDS 2+MULTI VIT PO) Take 1 capsule by mouth daily.    [provider]  nitroGLYCERIN (NITROSTAT) 0.4 MG SL tablet Place 0.4 mg under the tongue every 5 (five) minutes as needed for chest pain. 06/10/20   [provider]  REFRESH OPTIVE PF 0.5-0.9 % SOLN Place 1 drop into both eyes 2 (two) times daily as needed.    [provider]  rosuvastatin (CRESTOR) 5 MG tablet Take 5 mg by mouth daily. 11/19/19   [provider]  valACYclovir (VALTREX) 1000 MG tablet Take 1,000 mg by mouth as needed (breakouts).    [provider]      Allergies    Vancomycin    Review of Systems   Review of Systems  Gastrointestinal:  Positive for vomiting.  Neurological:  Positive for weakness.  All other systems reviewed and are negative.   Physical Exam Updated Vital Signs BP (!) 142/69   Pulse 68   Resp 19   SpO2 99%  Physical Exam Vitals and nursing note reviewed.  Constitutional:      Comments: Demented, slightly uncomfortable  HENT:     Head: Normocephalic.     Nose: Nose normal.     Mouth/Throat:     Mouth: Mucous membranes are dry.  Eyes:     Extraocular Movements: Extraocular movements intact.     Pupils: Pupils are equal,  round, and reactive to light.  Cardiovascular:     Rate and Rhythm: Normal rate and regular rhythm.     Pulses: Normal pulses.     Heart sounds: Normal heart sounds.  Pulmonary:     Effort: Pulmonary effort is normal.     Breath sounds: Normal breath sounds.  Abdominal:     General: Abdomen is flat.     Palpations: Abdomen is soft.     Comments: Mild lower abdominal tenderness.  No rebound or guarding  Musculoskeletal:        General: Normal range of motion.     Cervical back: Normal range of motion and neck supple.  Skin:    General: Skin is warm.     Capillary Refill: Capillary refill takes less than 2 seconds.  Neurological:     Comments: Demented,  ANO x1.  Moving all extremities  Psychiatric:     Comments: Unable      ED Results / Procedures / Treatments   Labs (all labs ordered are listed, but only abnormal results are displayed) Labs Reviewed  COMPREHENSIVE METABOLIC PANEL - Abnormal; Notable for the following components:      Result Value   CO2 20 (*)    Glucose, Bld 137 (*)    Alkaline Phosphatase 36 (*)    GFR, Estimated 59 (*)    All other components within normal limits  CBG MONITORING, ED - Abnormal; Notable for the following components:   Glucose-Capillary 133 (*)    All other components within normal limits  URINE CULTURE  CBC WITH DIFFERENTIAL/PLATELET  URINALYSIS, ROUTINE W REFLEX MICROSCOPIC  LIPASE, BLOOD  BRAIN NATRIURETIC PEPTIDE  TROPONIN I (HIGH SENSITIVITY)  TROPONIN I (HIGH SENSITIVITY)    EKG EKG Interpretation  Date/Time:  Thursday June 24 2022 15:01:54 EDT Ventricular Rate:  60 PR Interval:  196 QRS Duration: 128 QT Interval:  488 QTC Calculation: 488 R Axis:   -89 Text Interpretation: Sinus rhythm Multiform ventricular premature complexes Nonspecific IVCD with LAD Anterolateral infarct, age indeterminate No significant change since last tracing  Confirmed by Wandra Arthurs [phone]) on 06/24/2022 3:13:52 PM  Radiology CT ABDOMEN PELVIS W CONTRAST  Result Date: 06/24/2022 CLINICAL DATA:  Nausea/vomiting EXAM: CT ABDOMEN AND PELVIS WITH CONTRAST TECHNIQUE: Multidetector CT imaging of the abdomen and pelvis was performed using the standard protocol following bolus administration of intravenous contrast. RADIATION DOSE REDUCTION: This exam was performed according to the departmental dose-optimization program which includes automated exposure control, adjustment of the mA and/or kV according to patient size and/or use of iterative reconstruction technique. CONTRAST:  22m OMNIPAQUE IOHEXOL 350 MG/ML SOLN COMPARISON:  CT 04/18/2020 FINDINGS: Lower chest: Bibasilar opacities, likely  hypoventilatory change/atelectasis. Cardiomegaly. Dual chamber pacemaker leads are in place. Other than a tiny Hepatobiliary: No focal liver abnormality is seen calcified granuloma in the inferior right hepatic lobe. Prior cholecystectomy. Pancreas: Unremarkable. No pancreatic ductal dilatation or surrounding inflammatory changes. Spleen: Normal in size without focal abnormality. Small adjacent splenule. Adrenals/Urinary Tract: Adrenal glands are unremarkable. No hydronephrosis or nephrolithiasis. Tiny renal cysts which appear simple and require no follow-up imaging. The bladder is mildly distended. Stomach/Bowel: Mild distal gastric wall thickening. There is no evidence of bowel obstruction.The appendix is normal. Extensive colonic diverticulosis. No evidence of acute diverticulitis. Vascular/Lymphatic: Aortoiliac atherosclerosis. Kinking and focal stenosis of the celiac origin. No AAA. No lymphadenopathy. Reproductive: Unremarkable. Other: Fat containing umbilical hernia. No bowel containing hernia. No ascites. No free air. Musculoskeletal: No acute osseous abnormality. No suspicious  osseous lesion. Multilevel degenerative changes of the spine. Severe left and moderate right hip osteoarthritis. IMPRESSION: Mild distal gastric wall thickening, can be seen in gastritis. Extensive colonic diverticulosis without evidence of acute diverticulitis. No evidence of bowel obstruction. Normal appendix. Electronically Signed   By: Maurine Simmering M.D.   On: 06/24/2022 18:29   CT HEAD WO CONTRAST (5MM)  Result Date: 06/24/2022 CLINICAL DATA:  Mental status change EXAM: CT HEAD WITHOUT CONTRAST TECHNIQUE: Contiguous axial images were obtained from the base of the skull through the vertex without intravenous contrast. RADIATION DOSE REDUCTION: This exam was performed according to the departmental dose-optimization program which includes automated exposure control, adjustment of the mA and/or kV according to patient size and/or  use of iterative reconstruction technique. COMPARISON:  CT head 08/07/2020 FINDINGS: Brain: Moderate atrophy unchanged. Mild ventricular enlargement unchanged. Patchy white matter hypodensity bilaterally unchanged Negative for acute infarct, hemorrhage, mass Vascular: Negative for hyperdense vessel Skull: Negative Sinuses/Orbits: Paranasal sinuses clear. Bilateral cataract extraction Other: None IMPRESSION: Atrophy and chronic microvascular ischemia. No acute abnormality. No change from the prior study. Electronically Signed   By: Franchot Gallo M.D.   On: 06/24/2022 18:20   DG Chest Portable 1 View  Result Date: 06/24/2022 CLINICAL DATA:  Altered mental status EXAM: PORTABLE CHEST 1 VIEW COMPARISON:  06/02/2022 chest radiograph. FINDINGS: Stable 2 lead left subclavian pacemaker. Stable cardiomediastinal silhouette with mild cardiomegaly. No pneumothorax. No pleural effusion. Chronic mild elevation of the right hemidiaphragm. No overt pulmonary edema. Mild streaky bibasilar scarring versus atelectasis. IMPRESSION: Mild cardiomegaly without overt pulmonary edema. Mild streaky bibasilar scarring versus atelectasis. Electronically Signed   By: Ilona Sorrel M.D.   On: 06/24/2022 15:20    Procedures BLADDER CATHETERIZATION  Date/Time: 06/24/2022 9:10 PM  Performed by: Drenda Freeze, MD Authorized by: Drenda Freeze, MD   Consent:    Consent obtained:  Verbal   Consent given by:  Patient   Risks, benefits, and alternatives were discussed: yes     Risks discussed:  Infection   Alternatives discussed:  No treatment Universal protocol:    Procedure explained and questions answered to patient or proxy's satisfaction: yes     Patient identity confirmed:  Verbally with patient and arm band Pre-procedure details:    Procedure purpose:  Diagnostic Anesthesia:    Anesthesia method:  None Procedure details:    Provider performed due to:  Nurse unable to complete   Catheter insertion:   Non-indwelling   Catheter size:  8 Fr   Bladder irrigation: no     Number of attempts:  1   Urine characteristics:  Yellow Post-procedure details:    Procedure completion:  Tolerated     Medications Ordered in ED Medications  ondansetron (ZOFRAN) injection 4 mg (has no administration in time range)  pantoprazole (PROTONIX) injection 40 mg (has no administration in time range)  iohexol (OMNIPAQUE) 350 MG/ML injection 65 mL (65 mLs Intravenous Contrast Given 06/24/22 1740)    ED Course/ Medical Decision Making/ A&P                           Medical Decision Making ARYA LUTTRULL is a 88 y.o. female here presenting with altered mental status and abdominal pain.  Consider gastritis versus small bowel obstruction versus ileus versus intracranial bleeding.  Plan to get CBC and CMP and CT abdomen pelvis and urinalysis and CT head.  We will give Zofran and reassess  9:12 PM  I reviewed patient's labs and they were unremarkable.  CT showed gastritis.  Nurse was unable to cath the patient and I was able to do an in and out and there was no UTI.  Patient given Zofran and Protonix and has no vomiting in the ED.  Patient is stable for discharge back to facility.  Will start on Protonix.  Problems Addressed: Gastritis without bleeding, unspecified chronicity, unspecified gastritis type: acute illness or injury  Amount and/or Complexity of Data Reviewed Labs: ordered. Decision-making details documented in ED Course. Radiology: ordered and independent interpretation performed. Decision-making details documented in ED Course.  Risk Prescription drug management.    Final Clinical Impression(s) / ED Diagnoses Final diagnoses:  None    Rx / DC Orders ED Discharge Orders     None         Drenda Freeze, MD 06/24/22 2113

## 2022-06-24 NOTE — ED Triage Notes (Signed)
Pt BIB [facility] from [facility]. Family and staff found patient surrounded by vomit in her wheelchair. Once back in bed patient went "in and out" for "a while". Patient reported lower abd pain on palpation with EMS, no complains at this time. A&Ox3 now and at baseline.

## 2022-06-24 NOTE — ED Notes (Signed)
Attempted in and out cath x1 without success at this time. Primary nurse paden made aware

## 2022-06-25 DIAGNOSIS — R55 Syncope and collapse: Secondary | ICD-10-CM | POA: Diagnosis not present

## 2022-06-25 DIAGNOSIS — R4182 Altered mental status, unspecified: Secondary | ICD-10-CM | POA: Diagnosis not present

## 2022-06-27 LAB — URINE CULTURE: Culture: <10000 — AB

## 2022-06-30 DIAGNOSIS — M25552 Pain in left hip: Secondary | ICD-10-CM | POA: Diagnosis not present

## 2022-06-30 DIAGNOSIS — R262 Difficulty in walking, not elsewhere classified: Secondary | ICD-10-CM | POA: Diagnosis not present

## 2022-06-30 DIAGNOSIS — R5381 Other malaise: Secondary | ICD-10-CM | POA: Diagnosis not present

## 2022-07-01 ENCOUNTER — Inpatient Hospital Stay: Payer: PRIVATE HEALTH INSURANCE | Admitting: Internal Medicine

## 2022-07-01 ENCOUNTER — Other Ambulatory Visit: Payer: Self-pay

## 2022-07-07 ENCOUNTER — Telehealth: Payer: Self-pay

## 2022-07-07 DIAGNOSIS — R262 Difficulty in walking, not elsewhere classified: Secondary | ICD-10-CM | POA: Diagnosis not present

## 2022-07-07 DIAGNOSIS — M25552 Pain in left hip: Secondary | ICD-10-CM | POA: Diagnosis not present

## 2022-07-07 DIAGNOSIS — R5381 Other malaise: Secondary | ICD-10-CM | POA: Diagnosis not present

## 2022-07-07 NOTE — Telephone Encounter (Signed)
   Reason for call: ED-Follow up call  Patient visit on 06/24/2022 at [facility]   Have you been able to follow up with your primary care physician? - Not Yet, Patient is in Rehab  The patient was or was not able to obtain any needed medicine or equipment. - Was, Yes  Are there diet recommendations that you are having difficulty following? - No  Patient expresses understanding of discharge instructions and education provided has no other needs at this time.    [facility]  [place], [place]  Main Phone: [phone]  E-mail: [email or web address]  Website: [email or web address]

## 2022-07-08 ENCOUNTER — Ambulatory Visit (INDEPENDENT_AMBULATORY_CARE_PROVIDER_SITE_OTHER): Payer: Medicare Other | Admitting: Infectious Disease

## 2022-07-08 ENCOUNTER — Other Ambulatory Visit: Payer: Self-pay

## 2022-07-08 ENCOUNTER — Other Ambulatory Visit: Payer: Self-pay | Admitting: *Deleted

## 2022-07-08 ENCOUNTER — Encounter: Payer: Self-pay | Admitting: Infectious Disease

## 2022-07-08 VITALS — BP 129/73 | HR 64 | Temp 97.6°F

## 2022-07-08 DIAGNOSIS — Z7289 Other problems related to lifestyle: Secondary | ICD-10-CM

## 2022-07-08 DIAGNOSIS — G309 Alzheimer's disease, unspecified: Secondary | ICD-10-CM | POA: Diagnosis not present

## 2022-07-08 DIAGNOSIS — Z7984 Long term (current) use of oral hypoglycemic drugs: Secondary | ICD-10-CM | POA: Diagnosis not present

## 2022-07-08 DIAGNOSIS — A530 Latent syphilis, unspecified as early or late: Secondary | ICD-10-CM | POA: Diagnosis not present

## 2022-07-08 DIAGNOSIS — E1142 Type 2 diabetes mellitus with diabetic polyneuropathy: Secondary | ICD-10-CM | POA: Diagnosis not present

## 2022-07-08 DIAGNOSIS — I6381 Other cerebral infarction due to occlusion or stenosis of small artery: Secondary | ICD-10-CM | POA: Diagnosis not present

## 2022-07-08 DIAGNOSIS — Z114 Encounter for screening for human immunodeficiency virus [HIV]: Secondary | ICD-10-CM

## 2022-07-08 DIAGNOSIS — I6529 Occlusion and stenosis of unspecified carotid artery: Secondary | ICD-10-CM | POA: Diagnosis not present

## 2022-07-08 DIAGNOSIS — F02818 Dementia in other diseases classified elsewhere, unspecified severity, with other behavioral disturbance: Secondary | ICD-10-CM

## 2022-07-08 HISTORY — DX: Latent syphilis, unspecified as early or late: A53.0

## 2022-07-08 MED ORDER — DOXYCYCLINE HYCLATE 100 MG PO TABS: 100.0000 mg | ORAL_TABLET | Freq: Two times a day (BID) | ORAL | 0 refills | Status: AC

## 2022-07-08 NOTE — Progress Notes (Signed)
Subjective:  Reason for Infectious Disease Consult: + RPR and treponemal antibody test  Requesting Physician: Maria Erichsen, MD   Patient ID: Maria [name], female    DOB: [birth year], 88 y.o.   MRN: [id number]  HPI  [name] is a 88 year old black woman with a past medical history significant for complete heart block status post pacemaker placement in 6269 diastolic congestive heart failure non-insulin-dependent diabetes mellitus hyperlipidemia hypertension prior stroke breast cancer and Alzheimer's type dementia who had been admitted to [facility] with confusion and intermittent hallucinations.  She was treated for urinary tract infection and her electrolytes corrected.  An RPR was done in the midst of this which was positive at a titer of 1-2.  Treatment antibody positive test was also positive x 2.  The time there was no recollection that she had been treated.  I talked to her this morning with her daughter the patient claimed that she was treated for syphilis in [place] when she was in her 27s.   Did moved to [place] from [place] at the age of 48 but has lived in [place] ever since we call the health department they only had the recent RPR that was positive from September 2023 in the record.    Past Medical History:  Diagnosis Date   Anxiety    Arthritis    Cancer ([place])    breast 2015   Cardiac pacemaker in situ    Chest pain    Dyslipidemia    Dyspnea    with exertion   Family history of adverse reaction to anesthesia    daughter slow to wake up   Gallstones    Headache    Hypertension    Macular degeneration    Positive RPR test 07/08/2022   Pulmonary hypertension (HCC)    PA peak pressure 81 mmHg by 09/2016 echo   Type II or unspecified type diabetes mellitus without mention of complication, not stated as uncontrolled    Wears partial dentures     Past Surgical History:  Procedure Laterality Date   BREAST EXCISIONAL BIOPSY  Right 2010   CATARACT EXTRACTION     CHOLECYSTECTOMY N/A 01/18/2017   Procedure: LAPAROSCOPIC CHOLECYSTECTOMY;  Surgeon: Ralene Ok, MD;  Location: [facility];  Service: General;  Laterality: N/A;   IR GENERIC HISTORICAL  10/08/2016   IR PERC CHOLECYSTOSTOMY 10/08/2016 Greggory Keen, MD [facility]-INTERV RAD   IR RADIOLOGIST EVAL & MGMT  11/16/2016   KNEE SURGERY Bilateral    MULTIPLE TOOTH EXTRACTIONS     PACEMAKER INSERTION     [employer], [facility] 2003   PACEMAKER INSERTION     PPM GENERATOR CHANGEOUT N/A 04/16/2019   Procedure: PPM GENERATOR CHANGEOUT;  Surgeon: Evans Lance, MD;  Location: [facility];  Service: Cardiovascular;  Laterality: N/A;   TOTAL KNEE ARTHROPLASTY Bilateral     Family History  Problem Relation Age of Onset   Colon cancer Neg Hx       Social History   Socioeconomic History   Marital status: Widowed    Spouse name: Not on file   Number of children: 2   Years of education: Not on file   Highest education level: Not on file  Occupational History   Not on file  Tobacco Use   Smoking status: Never   Smokeless tobacco: Current    Types: Snuff  Vaping Use   Vaping Use: Never used  Substance and Sexual Activity   Alcohol use: No  Alcohol/week: 1.0 standard drink of alcohol    Types: 1 Cans of beer per week   Drug use: No   Sexual activity: Not on file  Other Topics Concern   Not on file  Social History Narrative   ** Merged History Encounter **       Social Determinants of Health   Financial Resource Strain: Not on file  Food Insecurity: Unknown (06/02/2022)   Hunger Vital Sign    Worried About Running Out of Food in the Last Year: Patient refused    [place] in the Last Year: Patient refused  Transportation Needs: Unknown (06/02/2022)   PRAPARE - Hydrologist (Medical): Patient refused    Lack of Transportation (Non-Medical): Patient refused  Physical Activity: Not on file  Stress: Not on file   Social Connections: Not on file    Allergies  Allergen Reactions   Vancomycin Itching     Current Outpatient Medications:    acetaminophen (TYLENOL) 650 MG CR tablet, Take 1,300 mg by mouth in the morning and at bedtime., Disp: , Rfl:    aspirin EC 81 MG tablet, Take 81 mg by mouth daily., Disp: , Rfl:    carboxymethylcellul-glycerin (OPTIVE) 0.5-0.9 % ophthalmic solution, Place 1 drop into both eyes daily., Disp: , Rfl:    cycloSPORINE (RESTASIS) 0.05 % ophthalmic emulsion, Place 1 drop into both eyes daily. , Disp: , Rfl:    donepezil (ARICEPT) 10 MG tablet, Take 10 mg by mouth at bedtime., Disp: , Rfl:    doxycycline (VIBRA-TABS) 100 MG tablet, Take 1 tablet (100 mg total) by mouth 2 (two) times daily for 28 days., Disp: 56 tablet, Rfl: 0   glimepiride (AMARYL) 1 MG tablet, Take 1 mg by mouth in the morning and at bedtime., Disp: , Rfl:    latanoprost (XALATAN) 0.005 % ophthalmic solution, Place 1 drop into both eyes at bedtime., Disp: , Rfl:    losartan (COZAAR) 50 MG tablet, Take 50 mg by mouth daily., Disp: , Rfl:    meclizine (ANTIVERT) 25 MG tablet, Take 25 mg by mouth 3 (three) times daily as needed (vertigo). , Disp: , Rfl:    meloxicam (MOBIC) 7.5 MG tablet, Take 7.5 mg by mouth daily as needed (for arthritis pain)., Disp: , Rfl:    metFORMIN (GLUCOPHAGE) 1000 MG tablet, Take 1,000 mg by mouth daily with breakfast., Disp: , Rfl:    ondansetron (ZOFRAN-ODT) 4 MG disintegrating tablet, '4mg'$  ODT q4 hours prn nausea/vomit, Disp: 10 tablet, Rfl: 0   rosuvastatin (CRESTOR) 5 MG tablet, Take 5 mg by mouth daily., Disp: , Rfl:    valACYclovir (VALTREX) 1000 MG tablet, Take 1,000 mg by mouth as needed (breakouts)., Disp: , Rfl:    Multiple Vitamins-Minerals (PRESERVISION AREDS 2+MULTI VIT PO), Take 1 capsule by mouth daily. (Patient not taking: Reported on 07/08/2022), Disp: , Rfl:    nitroGLYCERIN (NITROSTAT) 0.4 MG SL tablet, Place 0.4 mg under the tongue every 5 (five) minutes as  needed for chest pain. (Patient not taking: Reported on 07/08/2022), Disp: , Rfl:    pantoprazole (PROTONIX) 20 MG tablet, Take 1 tablet (20 mg total) by mouth daily. (Patient not taking: Reported on 07/08/2022), Disp: 30 tablet, Rfl: 0   REFRESH OPTIVE PF 0.5-0.9 % SOLN, Place 1 drop into both eyes 2 (two) times daily as needed., Disp: , Rfl:    Review of Systems  Unable to perform ROS: Dementia       Objective:  Physical Exam Constitutional:      General: She is not in acute distress.    Appearance: Normal appearance. She is well-developed. She is not ill-appearing or diaphoretic.  HENT:     Head: Normocephalic and atraumatic.     Right Ear: Hearing and external ear normal.     Left Ear: Hearing and external ear normal.     Nose: No nasal deformity or rhinorrhea.  Eyes:     General: No scleral icterus.    Conjunctiva/sclera: Conjunctivae normal.     Right eye: Right conjunctiva is not injected.     Left eye: Left conjunctiva is not injected.     Pupils: Pupils are equal, round, and reactive to light.  Neck:     Vascular: No JVD.  Cardiovascular:     Rate and Rhythm: Normal rate and regular rhythm.     Heart sounds: S1 normal and S2 normal.     No friction rub.  Pulmonary:     Effort: Pulmonary effort is normal. No respiratory distress.     Breath sounds: No wheezing.  Abdominal:     General: Bowel sounds are normal. There is no distension.     Palpations: Abdomen is soft.     Tenderness: There is no abdominal tenderness.  Musculoskeletal:        General: Normal range of motion.     Right shoulder: Normal.     Left shoulder: Normal.     Cervical back: Normal range of motion and neck supple.     Right hip: Normal.     Left hip: Normal.     Right knee: Normal.     Left knee: Normal.  Lymphadenopathy:     Head:     Right side of head: No submandibular, preauricular or posterior auricular adenopathy.     Left side of head: No submandibular, preauricular or posterior  auricular adenopathy.     Cervical: No cervical adenopathy.     Right cervical: No superficial or deep cervical adenopathy.    Left cervical: No superficial or deep cervical adenopathy.  Skin:    General: Skin is warm and dry.     Coloration: Skin is not pale.     Findings: No abrasion, bruising, ecchymosis, erythema, lesion or rash.     Nails: There is no clubbing.  Neurological:     Mental Status: She is alert.     Coordination: Coordination normal.     Gait: Gait normal.  Psychiatric:        Attention and Perception: She is attentive.        Speech: Speech is delayed.        Behavior: Behavior is cooperative.        Cognition and Memory: Cognition is impaired. Memory is impaired. She exhibits impaired recent memory and impaired remote memory.        Judgment: Judgment normal.           Assessment & Plan:   +RPR with TPA:  Her labs are c/w someone who has ALREADY been treated for syphilis and in a serofast state.  As she herself endorses that she was treated for syphilis when she was in her 17s.  Certainly she is demented and is possible that she is not giving accurate history.  I certainly do not think that she has neurosyphilis and that she warrants a lumbar puncture or treatment with IV penicillin.  Out of an abundance of thoroughness I will give her 28-day course of  doxycycline.  Also check for HIV since that should always be checked with testing for syphilis.   She can return to see [place] PRN  I spent 84 minutes with the patient including than 50% of the time in face to face counseling of the patient and her daughter re the + RPR along with review of medical records in preparation for the visit and during the visit and in coordination of her care.

## 2022-07-08 NOTE — Patient Outreach (Signed)
[facility] Post-Acute Care Coordinator follow up. Mrs. Derick resides in [facility]. Screening for potential [facility] care coordination services as benefit of insurance plan and PCP.  Facility site visit to [facility] on yesterday 07/07/22. Met with SNF social workers and therapy manager. Mrs. Belger likely transition home soon. Has supportive family that takes turns staying with Mrs. Lacher. Therapy to have family training.   Will plan outreach to daughter [name] to discuss [facility] services.     , MSN, RN,BSN [facility] Post Acute Care Coordinator [phone] (Direct dial)  

## 2022-07-09 DIAGNOSIS — K5900 Constipation, unspecified: Secondary | ICD-10-CM | POA: Diagnosis not present

## 2022-07-10 LAB — RPR TITER: RPR Titer: 1:1 {titer} — ABNORMAL HIGH

## 2022-07-10 LAB — HIV ANTIBODY (ROUTINE TESTING W REFLEX): HIV 1&2 Ab, 4th Generation: NONREACTIVE

## 2022-07-10 LAB — FLUORESCENT TREPONEMAL AB(FTA)-IGG-BLD: Fluorescent Treponemal ABS: REACTIVE — AB

## 2022-07-10 LAB — RPR: RPR Ser Ql: REACTIVE — AB

## 2022-07-13 DIAGNOSIS — K5904 Chronic idiopathic constipation: Secondary | ICD-10-CM | POA: Diagnosis not present

## 2022-07-13 DIAGNOSIS — Z79899 Other long term (current) drug therapy: Secondary | ICD-10-CM | POA: Diagnosis not present

## 2022-07-13 DIAGNOSIS — B0089 Other herpesviral infection: Secondary | ICD-10-CM | POA: Diagnosis not present

## 2022-07-13 DIAGNOSIS — I209 Angina pectoris, unspecified: Secondary | ICD-10-CM | POA: Diagnosis not present

## 2022-07-14 DIAGNOSIS — R262 Difficulty in walking, not elsewhere classified: Secondary | ICD-10-CM | POA: Diagnosis not present

## 2022-07-14 DIAGNOSIS — R5381 Other malaise: Secondary | ICD-10-CM | POA: Diagnosis not present

## 2022-07-14 DIAGNOSIS — K5900 Constipation, unspecified: Secondary | ICD-10-CM | POA: Diagnosis not present

## 2022-07-14 DIAGNOSIS — M25552 Pain in left hip: Secondary | ICD-10-CM | POA: Diagnosis not present

## 2022-07-21 ENCOUNTER — Ambulatory Visit (INDEPENDENT_AMBULATORY_CARE_PROVIDER_SITE_OTHER): Payer: Medicare Other

## 2022-07-21 ENCOUNTER — Other Ambulatory Visit: Payer: Self-pay | Admitting: *Deleted

## 2022-07-21 DIAGNOSIS — I442 Atrioventricular block, complete: Secondary | ICD-10-CM | POA: Diagnosis not present

## 2022-07-21 DIAGNOSIS — F02818 Dementia in other diseases classified elsewhere, unspecified severity, with other behavioral disturbance: Secondary | ICD-10-CM | POA: Diagnosis not present

## 2022-07-21 DIAGNOSIS — E1142 Type 2 diabetes mellitus with diabetic polyneuropathy: Secondary | ICD-10-CM | POA: Diagnosis not present

## 2022-07-21 DIAGNOSIS — I5032 Chronic diastolic (congestive) heart failure: Secondary | ICD-10-CM | POA: Diagnosis not present

## 2022-07-21 DIAGNOSIS — I10 Essential (primary) hypertension: Secondary | ICD-10-CM | POA: Diagnosis not present

## 2022-07-21 DIAGNOSIS — M6281 Muscle weakness (generalized): Secondary | ICD-10-CM | POA: Diagnosis not present

## 2022-07-21 LAB — CUP PACEART REMOTE DEVICE CHECK
Battery Remaining Longevity: 98 mo
Battery Voltage: 3 V
Brady Statistic AP VP Percent: 18.71 %
Brady Statistic AP VS Percent: 0 %
Brady Statistic AS VP Percent: 79.62 %
Brady Statistic AS VS Percent: 1.67 %
Brady Statistic RA Percent Paced: 19.25 %
Brady Statistic RV Percent Paced: 98.33 %
Date Time Interrogation Session: 20231108130445
Implantable Lead Connection Status: 753985
Implantable Lead Connection Status: 753985
Implantable Lead Implant Date: 20030311
Implantable Lead Implant Date: 20030311
Implantable Lead Location: 753859
Implantable Lead Location: 753860
Implantable Lead Model: 4092
Implantable Lead Model: 4592
Implantable Pulse Generator Implant Date: 20200803
Lead Channel Impedance Value: 399 Ohm
Lead Channel Impedance Value: 456 Ohm
Lead Channel Impedance Value: 456 Ohm
Lead Channel Impedance Value: 513 Ohm
Lead Channel Pacing Threshold Amplitude: 0.375 V
Lead Channel Pacing Threshold Amplitude: 0.625 V
Lead Channel Pacing Threshold Pulse Width: 0.4 ms
Lead Channel Pacing Threshold Pulse Width: 0.4 ms
Lead Channel Sensing Intrinsic Amplitude: 1.75 mV
Lead Channel Sensing Intrinsic Amplitude: 1.75 mV
Lead Channel Sensing Intrinsic Amplitude: 5.375 mV
Lead Channel Sensing Intrinsic Amplitude: 5.375 mV
Lead Channel Setting Pacing Amplitude: 1.5 V
Lead Channel Setting Pacing Amplitude: 2.5 V
Lead Channel Setting Pacing Pulse Width: 0.4 ms
Lead Channel Setting Sensing Sensitivity: 4 mV
Zone Setting Status: 755011
Zone Setting Status: 755011

## 2022-07-21 NOTE — Patient Outreach (Signed)
[facility] Coordinator follow up. Screening for potential [facility] care coordination needs as benefit of insurance plan and PCP.   Met with Maria [name], [facility] social worker earlier today. Maria [name] discharged home today 07/21/22 with family. Will have [place] home health.  Will contact daughter/DPR Maria [name] to discuss [facility] care coordination services.   Maria Rolling, MSN, RN,BSN [facility] Acute Care Coordinator [phone] (Direct dial)

## 2022-07-22 ENCOUNTER — Other Ambulatory Visit: Payer: Self-pay | Admitting: *Deleted

## 2022-07-22 DIAGNOSIS — I5032 Chronic diastolic (congestive) heart failure: Secondary | ICD-10-CM

## 2022-07-22 NOTE — Patient Outreach (Signed)
[facility] Post- Acute Care Coordinator follow up. Maria [name] discharged from [facility] on 07/21/22 to home with daughter Maria [name]. Will have [facility] home health.   Telephone call made to Maria [name] (daughter/DPR) [phone] to discuss [facility] care coordination services. Patient identifiers confirmed. Explained [facility] care coordination services. Maria [name] is agreeable. Maria [name] is primary contact. Reports Maria [name] lives with her. Privately paid caregiver will come on Tuesday, Thursday, Saturday. She is waiting for [facility] to call to schedule visit.   Will make referral to [facility] care coordination team. Maria [name] (daughter/DPR) [phone] is primary contact.    [name], MSN, RN,BSN [facility] Acute Care Coordinator [phone] (Direct dial)

## 2022-07-23 ENCOUNTER — Telehealth: Payer: Self-pay | Admitting: *Deleted

## 2022-07-23 DIAGNOSIS — Z9181 History of falling: Secondary | ICD-10-CM | POA: Diagnosis not present

## 2022-07-23 DIAGNOSIS — H9190 Unspecified hearing loss, unspecified ear: Secondary | ICD-10-CM | POA: Diagnosis not present

## 2022-07-23 DIAGNOSIS — I5042 Chronic combined systolic (congestive) and diastolic (congestive) heart failure: Secondary | ICD-10-CM | POA: Diagnosis not present

## 2022-07-23 DIAGNOSIS — F02B3 Dementia in other diseases classified elsewhere, moderate, with mood disturbance: Secondary | ICD-10-CM | POA: Diagnosis not present

## 2022-07-23 DIAGNOSIS — E1151 Type 2 diabetes mellitus with diabetic peripheral angiopathy without gangrene: Secondary | ICD-10-CM | POA: Diagnosis not present

## 2022-07-23 DIAGNOSIS — B009 Herpesviral infection, unspecified: Secondary | ICD-10-CM | POA: Diagnosis not present

## 2022-07-23 DIAGNOSIS — Z7982 Long term (current) use of aspirin: Secondary | ICD-10-CM | POA: Diagnosis not present

## 2022-07-23 DIAGNOSIS — H8110 Benign paroxysmal vertigo, unspecified ear: Secondary | ICD-10-CM | POA: Diagnosis not present

## 2022-07-23 DIAGNOSIS — K5790 Diverticulosis of intestine, part unspecified, without perforation or abscess without bleeding: Secondary | ICD-10-CM | POA: Diagnosis not present

## 2022-07-23 DIAGNOSIS — F02B18 Dementia in other diseases classified elsewhere, moderate, with other behavioral disturbance: Secondary | ICD-10-CM | POA: Diagnosis not present

## 2022-07-23 DIAGNOSIS — E1142 Type 2 diabetes mellitus with diabetic polyneuropathy: Secondary | ICD-10-CM | POA: Diagnosis not present

## 2022-07-23 DIAGNOSIS — H409 Unspecified glaucoma: Secondary | ICD-10-CM | POA: Diagnosis not present

## 2022-07-23 DIAGNOSIS — G309 Alzheimer's disease, unspecified: Secondary | ICD-10-CM | POA: Diagnosis not present

## 2022-07-23 DIAGNOSIS — H04129 Dry eye syndrome of unspecified lacrimal gland: Secondary | ICD-10-CM | POA: Diagnosis not present

## 2022-07-23 DIAGNOSIS — D649 Anemia, unspecified: Secondary | ICD-10-CM | POA: Diagnosis not present

## 2022-07-23 DIAGNOSIS — H3530 Unspecified macular degeneration: Secondary | ICD-10-CM | POA: Diagnosis not present

## 2022-07-23 DIAGNOSIS — I272 Pulmonary hypertension, unspecified: Secondary | ICD-10-CM | POA: Diagnosis not present

## 2022-07-23 DIAGNOSIS — F32A Depression, unspecified: Secondary | ICD-10-CM | POA: Diagnosis not present

## 2022-07-23 DIAGNOSIS — E1169 Type 2 diabetes mellitus with other specified complication: Secondary | ICD-10-CM | POA: Diagnosis not present

## 2022-07-23 DIAGNOSIS — G9341 Metabolic encephalopathy: Secondary | ICD-10-CM | POA: Diagnosis not present

## 2022-07-23 DIAGNOSIS — E46 Unspecified protein-calorie malnutrition: Secondary | ICD-10-CM | POA: Diagnosis not present

## 2022-07-23 DIAGNOSIS — M1990 Unspecified osteoarthritis, unspecified site: Secondary | ICD-10-CM | POA: Diagnosis not present

## 2022-07-23 DIAGNOSIS — F02B4 Dementia in other diseases classified elsewhere, moderate, with anxiety: Secondary | ICD-10-CM | POA: Diagnosis not present

## 2022-07-23 DIAGNOSIS — I110 Hypertensive heart disease with heart failure: Secondary | ICD-10-CM | POA: Diagnosis not present

## 2022-07-23 DIAGNOSIS — E782 Mixed hyperlipidemia: Secondary | ICD-10-CM | POA: Diagnosis not present

## 2022-07-23 DIAGNOSIS — I442 Atrioventricular block, complete: Secondary | ICD-10-CM | POA: Diagnosis not present

## 2022-07-23 NOTE — Progress Notes (Signed)
  Care Coordination   Note   07/23/2022 Name: Maria [name] MRN: [id number] DOB: [birth year]  Maria [name] is a 88 y.o. year old female who sees Bakare, Larey Dresser, MD for primary care. I reached out to [name] by phone today to offer care coordination services.  [name] was given information about Care Coordination services today including:   The Care Coordination services include support from the care team which includes your Nurse Coordinator, Clinical Social Worker, or Pharmacist.  The Care Coordination team is here to help remove barriers to the health concerns and goals most important to you. Care Coordination services are voluntary, and the patient may decline or stop services at any time by request to their care team member.   Care Coordination Consent Status: Patient agreed to services and verbal consent obtained.   Follow up plan:  Telephone appointment with care coordination team member scheduled for:  07/26/22 with RNCM and 07/30/22 SW  Encounter Outcome:  Pt. Scheduled  [place]  Direct Dial: [phone]

## 2022-07-26 ENCOUNTER — Ambulatory Visit: Payer: Self-pay

## 2022-07-26 DIAGNOSIS — F02B18 Dementia in other diseases classified elsewhere, moderate, with other behavioral disturbance: Secondary | ICD-10-CM | POA: Diagnosis not present

## 2022-07-26 DIAGNOSIS — E1142 Type 2 diabetes mellitus with diabetic polyneuropathy: Secondary | ICD-10-CM | POA: Diagnosis not present

## 2022-07-26 DIAGNOSIS — I5042 Chronic combined systolic (congestive) and diastolic (congestive) heart failure: Secondary | ICD-10-CM | POA: Diagnosis not present

## 2022-07-26 DIAGNOSIS — G309 Alzheimer's disease, unspecified: Secondary | ICD-10-CM | POA: Diagnosis not present

## 2022-07-26 DIAGNOSIS — I110 Hypertensive heart disease with heart failure: Secondary | ICD-10-CM | POA: Diagnosis not present

## 2022-07-26 DIAGNOSIS — G9341 Metabolic encephalopathy: Secondary | ICD-10-CM | POA: Diagnosis not present

## 2022-07-26 NOTE — Patient Outreach (Signed)
  Care Coordination   Initial Visit Note   07/26/2022 Name: Maria [name] MRN: [id number] DOB: January 02, 1924  Maria [name] is a 88 y.o. year old female who sees Bakare, Larey Dresser, MD for primary care. I spoke with  [name] by phone today. Spoke with daughter Gwenlyn Saran DPR  What matters to the patients health and wellness today?  Pt/caregiver would like pt to live at home as long as possible and to stay out of the hospital Daughter states that pt is doing good since she came home from the nursing home last week.  States that home health has been seeing her for therapy and nursing.     Goals Addressed             This Visit's Progress    Care Coordination Activities-Pt/caregiver will verbalize understanding of early signs of UTI       Care Coordination Interventions: Evaluation of current treatment plan related to dementia, UTI and patient's adherence to plan as established by provider Reviewed medications with patient and discussed adherence Reviewed scheduled/upcoming provider appointments including Dr. Maia Petties 08/12/22 Social Work referral for scheduled for 08/02/22 for caregiver assistance and would like more resources to help keep pt in her home Assessed social determinant of health barriers Assessment of understanding of urinary tract infection diagnosis Education: Basic overview and discussion of urinary tract infections with patient Reviewed that s/sx of UTI in older pt's and people with dementia can be different and to notify MD at any early signs of change in mental status          SDOH assessments and interventions completed:  Yes  SDOH Interventions Today    Flowsheet Row Most Recent Value  SDOH Interventions   Food Insecurity Interventions Intervention Not Indicated  Housing Interventions Intervention Not Indicated  Utilities Interventions Intervention Not Indicated        Care Coordination Interventions Activated:  Yes  Care Coordination  Interventions:  Yes, provided   Follow up plan: Follow up call scheduled for 08/18/22    Encounter Outcome:  Pt. Visit Completed  Peter Garter RN, [facility], [place] [facility] [phone]

## 2022-07-26 NOTE — Patient Instructions (Signed)
Visit Information  Thank you for taking time to visit with me today. Please don't hesitate to contact me if I can be of assistance to you.   Following are the goals we discussed today:   Goals Addressed             This Visit's Progress    Care Coordination Activities-Pt/caregiver will verbalize understanding of early signs of UTI       Care Coordination Interventions: Evaluation of current treatment plan related to dementia, UTI and patient's adherence to plan as established by provider Reviewed medications with patient and discussed adherence Reviewed scheduled/upcoming provider appointments including Dr. Maia Petties 08/12/22 Social Work referral for scheduled for 08/02/22 for caregiver assistance and would like more resources to help keep pt in her home Assessed social determinant of health barriers Assessment of understanding of urinary tract infection diagnosis Education: Basic overview and discussion of urinary tract infections with patient Reviewed that s/sx of UTI in older pt's and people with dementia can be different and to notify MD at any early signs of change in mental status          Our next appointment is by telephone on 08/18/22 at 10 AM  Please call the care guide team at [phone] if you need to cancel or reschedule your appointment.   If you are experiencing a Mental Health or [place] or need someone to talk to, please call the Suicide and Crisis Lifeline: 988 call the [place] National Suicide Prevention Lifeline: 629-376-5381 or TTY: 340-710-5911 TTY 236-453-6985) to talk to a trained counselor call 1-800-273-TALK (toll free, 24 hour hotline) go to [facility] [address], [place] [phone]) call 911   The patient verbalized understanding of instructions, educational materials, and care plan provided today and agreed to receive a mailed copy of patient instructions, educational materials, and care  plan.   Telephone follow up appointment with care management team member scheduled for: 08/18/22   Peter Garter RN, [facility] [place], [place] [facility] [phone]

## 2022-07-29 ENCOUNTER — Encounter: Payer: Medicare Other | Admitting: Licensed Clinical Social Worker

## 2022-07-30 ENCOUNTER — Encounter: Payer: Medicare Other | Admitting: *Deleted

## 2022-07-31 DIAGNOSIS — I5042 Chronic combined systolic (congestive) and diastolic (congestive) heart failure: Secondary | ICD-10-CM | POA: Diagnosis not present

## 2022-07-31 DIAGNOSIS — F02B18 Dementia in other diseases classified elsewhere, moderate, with other behavioral disturbance: Secondary | ICD-10-CM | POA: Diagnosis not present

## 2022-07-31 DIAGNOSIS — I110 Hypertensive heart disease with heart failure: Secondary | ICD-10-CM | POA: Diagnosis not present

## 2022-07-31 DIAGNOSIS — G9341 Metabolic encephalopathy: Secondary | ICD-10-CM | POA: Diagnosis not present

## 2022-07-31 DIAGNOSIS — G309 Alzheimer's disease, unspecified: Secondary | ICD-10-CM | POA: Diagnosis not present

## 2022-07-31 DIAGNOSIS — E1142 Type 2 diabetes mellitus with diabetic polyneuropathy: Secondary | ICD-10-CM | POA: Diagnosis not present

## 2022-08-02 ENCOUNTER — Ambulatory Visit: Payer: Self-pay | Admitting: Licensed Clinical Social Worker

## 2022-08-02 DIAGNOSIS — I5042 Chronic combined systolic (congestive) and diastolic (congestive) heart failure: Secondary | ICD-10-CM | POA: Diagnosis not present

## 2022-08-02 DIAGNOSIS — E1142 Type 2 diabetes mellitus with diabetic polyneuropathy: Secondary | ICD-10-CM | POA: Diagnosis not present

## 2022-08-02 DIAGNOSIS — I110 Hypertensive heart disease with heart failure: Secondary | ICD-10-CM | POA: Diagnosis not present

## 2022-08-02 DIAGNOSIS — F02B18 Dementia in other diseases classified elsewhere, moderate, with other behavioral disturbance: Secondary | ICD-10-CM | POA: Diagnosis not present

## 2022-08-02 DIAGNOSIS — G9341 Metabolic encephalopathy: Secondary | ICD-10-CM | POA: Diagnosis not present

## 2022-08-02 DIAGNOSIS — G309 Alzheimer's disease, unspecified: Secondary | ICD-10-CM | POA: Diagnosis not present

## 2022-08-02 NOTE — Patient Instructions (Signed)
Visit Information  Thank you for taking time to visit with me today. Please don't hesitate to contact me if I can be of assistance to you.   Following are the goals we discussed today:   Goals Addressed             This Visit's Progress    COMPLETED: Care Coordination Care Giver support No Follow up Required       Care Coordination Interventions: Solution-Focused Strategies employed:  Active listening / Reflection utilized  Emotional Support Provided Behavioral Activation reviewed Problem [place] strategies reviewed Provided general psycho-education for mental health needs  Caregiver stress acknowledged :Discussed self-care Discussed caregiver resources and support: has information discussed  Discussed family support and building support system : Family supports needs at this time [facility] program (has connected) Reviewed facility placement process :not ready to move forward at this time  Provided educational information "When a loved one needs Long-term Care"           Please call the care guide team at [phone] if you need to cancel or reschedule your appointment.   No further follow up required: [place], [place] [phone]

## 2022-08-02 NOTE — Patient Outreach (Signed)
  Care Coordination  Initial Visit Note   08/02/2022 Name: Maria [name] MRN: [id number] DOB: 1924-09-05  Maria [name] is a 88 y.o. year old female who sees Bakare, Larey Dresser, MD for primary care. I spoke with  [name]'s daughter by phone today.  What matters to the patients health and wellness today?    Patient's daughter [name] provided all information during this encounter. Patient's caregiver reports no concerns or needs with health and wellness related to physical or mental heath.  LCSW conducted brief assessment, recommendations and relevant information discussed.   Per daughter family is supporting patient, they have explored long-term care options when the time comes .   Recommendation: Patient 's daughter may benefit from, and is in agreement to Continue self-care at the [facility], walking and attending church.    Goals Addressed             This Visit's Progress    COMPLETED: Care Coordination Care Giver support No Follow up Required       Care Coordination Interventions: Solution-Focused Strategies employed:  Active listening / Reflection utilized  Emotional Support Provided Behavioral Activation reviewed Problem [place] strategies reviewed Provided general psycho-education for mental health needs  Caregiver stress acknowledged :Discussed self-care Discussed caregiver resources and support: has information discussed  Discussed family support and building support system : Family supports needs at this time [facility] program (has connected) Reviewed facility placement process :not ready to move forward at this time  Provided educational information "When a loved one needs Long-term Care"           SDOH assessments and interventions completed:  Yes  SDOH Interventions Today    Flowsheet Row Most Recent Value  SDOH Interventions   Financial Strain Interventions Intervention Not Indicated        Care Coordination  Interventions Activated:  Yes  Care Coordination Interventions:  Yes, provided   Follow up plan: No further intervention required.   Encounter Outcome:  Pt. Visit Completed   Casimer Lanius, [place] [phone]

## 2022-08-03 DIAGNOSIS — I110 Hypertensive heart disease with heart failure: Secondary | ICD-10-CM | POA: Diagnosis not present

## 2022-08-03 DIAGNOSIS — G309 Alzheimer's disease, unspecified: Secondary | ICD-10-CM | POA: Diagnosis not present

## 2022-08-03 DIAGNOSIS — G9341 Metabolic encephalopathy: Secondary | ICD-10-CM | POA: Diagnosis not present

## 2022-08-03 DIAGNOSIS — E1142 Type 2 diabetes mellitus with diabetic polyneuropathy: Secondary | ICD-10-CM | POA: Diagnosis not present

## 2022-08-03 DIAGNOSIS — F02B18 Dementia in other diseases classified elsewhere, moderate, with other behavioral disturbance: Secondary | ICD-10-CM | POA: Diagnosis not present

## 2022-08-03 DIAGNOSIS — I5042 Chronic combined systolic (congestive) and diastolic (congestive) heart failure: Secondary | ICD-10-CM | POA: Diagnosis not present

## 2022-08-04 DIAGNOSIS — I110 Hypertensive heart disease with heart failure: Secondary | ICD-10-CM | POA: Diagnosis not present

## 2022-08-04 DIAGNOSIS — G309 Alzheimer's disease, unspecified: Secondary | ICD-10-CM | POA: Diagnosis not present

## 2022-08-04 DIAGNOSIS — F02B18 Dementia in other diseases classified elsewhere, moderate, with other behavioral disturbance: Secondary | ICD-10-CM | POA: Diagnosis not present

## 2022-08-04 DIAGNOSIS — E1142 Type 2 diabetes mellitus with diabetic polyneuropathy: Secondary | ICD-10-CM | POA: Diagnosis not present

## 2022-08-04 DIAGNOSIS — G9341 Metabolic encephalopathy: Secondary | ICD-10-CM | POA: Diagnosis not present

## 2022-08-04 DIAGNOSIS — I5042 Chronic combined systolic (congestive) and diastolic (congestive) heart failure: Secondary | ICD-10-CM | POA: Diagnosis not present

## 2022-08-09 DIAGNOSIS — I110 Hypertensive heart disease with heart failure: Secondary | ICD-10-CM | POA: Diagnosis not present

## 2022-08-09 DIAGNOSIS — G9341 Metabolic encephalopathy: Secondary | ICD-10-CM | POA: Diagnosis not present

## 2022-08-09 DIAGNOSIS — F02B18 Dementia in other diseases classified elsewhere, moderate, with other behavioral disturbance: Secondary | ICD-10-CM | POA: Diagnosis not present

## 2022-08-09 DIAGNOSIS — G309 Alzheimer's disease, unspecified: Secondary | ICD-10-CM | POA: Diagnosis not present

## 2022-08-09 DIAGNOSIS — E1142 Type 2 diabetes mellitus with diabetic polyneuropathy: Secondary | ICD-10-CM | POA: Diagnosis not present

## 2022-08-09 DIAGNOSIS — I5042 Chronic combined systolic (congestive) and diastolic (congestive) heart failure: Secondary | ICD-10-CM | POA: Diagnosis not present

## 2022-08-09 NOTE — Progress Notes (Signed)
Remote pacemaker transmission.   

## 2022-08-12 DIAGNOSIS — I110 Hypertensive heart disease with heart failure: Secondary | ICD-10-CM | POA: Diagnosis not present

## 2022-08-12 DIAGNOSIS — E1142 Type 2 diabetes mellitus with diabetic polyneuropathy: Secondary | ICD-10-CM | POA: Diagnosis not present

## 2022-08-12 DIAGNOSIS — I5042 Chronic combined systolic (congestive) and diastolic (congestive) heart failure: Secondary | ICD-10-CM | POA: Diagnosis not present

## 2022-08-12 DIAGNOSIS — G309 Alzheimer's disease, unspecified: Secondary | ICD-10-CM | POA: Diagnosis not present

## 2022-08-12 DIAGNOSIS — F02B18 Dementia in other diseases classified elsewhere, moderate, with other behavioral disturbance: Secondary | ICD-10-CM | POA: Diagnosis not present

## 2022-08-12 DIAGNOSIS — G9341 Metabolic encephalopathy: Secondary | ICD-10-CM | POA: Diagnosis not present

## 2022-08-16 DIAGNOSIS — E1142 Type 2 diabetes mellitus with diabetic polyneuropathy: Secondary | ICD-10-CM | POA: Diagnosis not present

## 2022-08-16 DIAGNOSIS — I5042 Chronic combined systolic (congestive) and diastolic (congestive) heart failure: Secondary | ICD-10-CM | POA: Diagnosis not present

## 2022-08-16 DIAGNOSIS — I110 Hypertensive heart disease with heart failure: Secondary | ICD-10-CM | POA: Diagnosis not present

## 2022-08-16 DIAGNOSIS — G309 Alzheimer's disease, unspecified: Secondary | ICD-10-CM | POA: Diagnosis not present

## 2022-08-16 DIAGNOSIS — G9341 Metabolic encephalopathy: Secondary | ICD-10-CM | POA: Diagnosis not present

## 2022-08-16 DIAGNOSIS — F02B18 Dementia in other diseases classified elsewhere, moderate, with other behavioral disturbance: Secondary | ICD-10-CM | POA: Diagnosis not present

## 2022-08-18 ENCOUNTER — Ambulatory Visit: Payer: Self-pay

## 2022-08-18 DIAGNOSIS — G9341 Metabolic encephalopathy: Secondary | ICD-10-CM | POA: Diagnosis not present

## 2022-08-18 DIAGNOSIS — I110 Hypertensive heart disease with heart failure: Secondary | ICD-10-CM | POA: Diagnosis not present

## 2022-08-18 DIAGNOSIS — G309 Alzheimer's disease, unspecified: Secondary | ICD-10-CM | POA: Diagnosis not present

## 2022-08-18 DIAGNOSIS — F02B18 Dementia in other diseases classified elsewhere, moderate, with other behavioral disturbance: Secondary | ICD-10-CM | POA: Diagnosis not present

## 2022-08-18 DIAGNOSIS — E1142 Type 2 diabetes mellitus with diabetic polyneuropathy: Secondary | ICD-10-CM | POA: Diagnosis not present

## 2022-08-18 DIAGNOSIS — I5042 Chronic combined systolic (congestive) and diastolic (congestive) heart failure: Secondary | ICD-10-CM | POA: Diagnosis not present

## 2022-08-18 NOTE — Patient Instructions (Signed)
Visit Information  Thank you for taking time to visit with me today. Please don't hesitate to contact me if I can be of assistance to you.   Following are the goals we discussed today:   Goals Addressed             This Visit's Progress    COMPLETED: Care Coordination Activities-Pt/caregiver will verbalize understanding of early signs of UTI       Care Coordination Interventions: Evaluation of current treatment plan related to dementia, UTI and patient's adherence to plan as established by provider Reviewed medications with patient and discussed adherence Reviewed scheduled/upcoming provider appointments including Dr. Maia Petties 09/02/22 Social Work referral for completed social work referral and caregiver provided with resources for care giver stress Assessment of understanding of urinary tract infection diagnosis Education: Basic overview and discussion of urinary tract infections with patient Reinforced that s/sx of UTI in older pt's and people with dementia can be different and to notify MD at any early signs of change in mental status. Denies any current issues and verbalizes good teach back of s/sx to call MD          If you are experiencing a Mental Health or [place] or need someone to talk to, please call the Suicide and Crisis Lifeline: 988 call the [place] National Suicide Prevention Lifeline: 579-330-5347 or TTY: 706-079-2225 TTY 314-788-1956) to talk to a trained counselor call 1-800-273-TALK (toll free, 24 hour hotline) go to [facility] [place] [phone]) call 911   The patient verbalized understanding of instructions, educational materials, and care plan provided today and DECLINED offer to receive copy of patient instructions, educational materials, and care plan.   No further follow up required:    Peter Garter RN, [facility] [place], [place]  [facility] [phone]

## 2022-08-18 NOTE — Patient Outreach (Signed)
  Care Coordination   Follow Up Visit Note   08/18/2022 Name: Maria [name] MRN: [id number] DOB: September 09, 1924  Maria [name] is a 88 y.o. year old female who sees Bakare, Larey Dresser, MD for primary care. I  Spoke with daughter Gwenlyn Saran DPR  What matters to the patients health and wellness today?  States Mother is doing well and still getting [facility] for nursing, PT/OT.  States she is eating well and her CBGs have ranged 165-170.  Denies any s/sx of UTI.  States she is coping well as a caregiver.  No further needs identified    Goals Addressed             This Visit's Progress    COMPLETED: Care Coordination Activities-Pt/caregiver will verbalize understanding of early signs of UTI       Care Coordination Interventions: Evaluation of current treatment plan related to dementia, UTI and patient's adherence to plan as established by provider Reviewed medications with patient and discussed adherence Reviewed scheduled/upcoming provider appointments including Dr. Maia Petties 09/02/22 Social Work referral for completed social work referral and caregiver provided with resources for care giver stress Assessment of understanding of urinary tract infection diagnosis Education: Basic overview and discussion of urinary tract infections with patient Reinforced that s/sx of UTI in older pt's and people with dementia can be different and to notify MD at any early signs of change in mental status. Denies any current issues and verbalizes good teach back of s/sx to call MD          SDOH assessments and interventions completed:  No     Care Coordination Interventions:  Yes, provided   Follow up plan: No further intervention required.   Encounter Outcome:  Pt. Visit Completed  Peter Garter RN, BSN,CCM, CDE Care Management Coordinator [place] [facility] [phone]

## 2022-08-19 DIAGNOSIS — G309 Alzheimer's disease, unspecified: Secondary | ICD-10-CM | POA: Diagnosis not present

## 2022-08-19 DIAGNOSIS — G9341 Metabolic encephalopathy: Secondary | ICD-10-CM | POA: Diagnosis not present

## 2022-08-19 DIAGNOSIS — I5042 Chronic combined systolic (congestive) and diastolic (congestive) heart failure: Secondary | ICD-10-CM | POA: Diagnosis not present

## 2022-08-19 DIAGNOSIS — F02B18 Dementia in other diseases classified elsewhere, moderate, with other behavioral disturbance: Secondary | ICD-10-CM | POA: Diagnosis not present

## 2022-08-19 DIAGNOSIS — E1142 Type 2 diabetes mellitus with diabetic polyneuropathy: Secondary | ICD-10-CM | POA: Diagnosis not present

## 2022-08-19 DIAGNOSIS — I110 Hypertensive heart disease with heart failure: Secondary | ICD-10-CM | POA: Diagnosis not present

## 2022-08-22 DIAGNOSIS — E1142 Type 2 diabetes mellitus with diabetic polyneuropathy: Secondary | ICD-10-CM | POA: Diagnosis not present

## 2022-08-22 DIAGNOSIS — H3530 Unspecified macular degeneration: Secondary | ICD-10-CM | POA: Diagnosis not present

## 2022-08-22 DIAGNOSIS — I442 Atrioventricular block, complete: Secondary | ICD-10-CM | POA: Diagnosis not present

## 2022-08-22 DIAGNOSIS — B009 Herpesviral infection, unspecified: Secondary | ICD-10-CM | POA: Diagnosis not present

## 2022-08-22 DIAGNOSIS — F02B3 Dementia in other diseases classified elsewhere, moderate, with mood disturbance: Secondary | ICD-10-CM | POA: Diagnosis not present

## 2022-08-22 DIAGNOSIS — G9341 Metabolic encephalopathy: Secondary | ICD-10-CM | POA: Diagnosis not present

## 2022-08-22 DIAGNOSIS — E1151 Type 2 diabetes mellitus with diabetic peripheral angiopathy without gangrene: Secondary | ICD-10-CM | POA: Diagnosis not present

## 2022-08-22 DIAGNOSIS — F32A Depression, unspecified: Secondary | ICD-10-CM | POA: Diagnosis not present

## 2022-08-22 DIAGNOSIS — F02B4 Dementia in other diseases classified elsewhere, moderate, with anxiety: Secondary | ICD-10-CM | POA: Diagnosis not present

## 2022-08-22 DIAGNOSIS — K5790 Diverticulosis of intestine, part unspecified, without perforation or abscess without bleeding: Secondary | ICD-10-CM | POA: Diagnosis not present

## 2022-08-22 DIAGNOSIS — I272 Pulmonary hypertension, unspecified: Secondary | ICD-10-CM | POA: Diagnosis not present

## 2022-08-22 DIAGNOSIS — H409 Unspecified glaucoma: Secondary | ICD-10-CM | POA: Diagnosis not present

## 2022-08-22 DIAGNOSIS — G309 Alzheimer's disease, unspecified: Secondary | ICD-10-CM | POA: Diagnosis not present

## 2022-08-22 DIAGNOSIS — E46 Unspecified protein-calorie malnutrition: Secondary | ICD-10-CM | POA: Diagnosis not present

## 2022-08-22 DIAGNOSIS — I5042 Chronic combined systolic (congestive) and diastolic (congestive) heart failure: Secondary | ICD-10-CM | POA: Diagnosis not present

## 2022-08-22 DIAGNOSIS — H8110 Benign paroxysmal vertigo, unspecified ear: Secondary | ICD-10-CM | POA: Diagnosis not present

## 2022-08-22 DIAGNOSIS — H04129 Dry eye syndrome of unspecified lacrimal gland: Secondary | ICD-10-CM | POA: Diagnosis not present

## 2022-08-22 DIAGNOSIS — I110 Hypertensive heart disease with heart failure: Secondary | ICD-10-CM | POA: Diagnosis not present

## 2022-08-22 DIAGNOSIS — E1169 Type 2 diabetes mellitus with other specified complication: Secondary | ICD-10-CM | POA: Diagnosis not present

## 2022-08-22 DIAGNOSIS — F02B18 Dementia in other diseases classified elsewhere, moderate, with other behavioral disturbance: Secondary | ICD-10-CM | POA: Diagnosis not present

## 2022-08-22 DIAGNOSIS — Z7982 Long term (current) use of aspirin: Secondary | ICD-10-CM | POA: Diagnosis not present

## 2022-08-22 DIAGNOSIS — M1990 Unspecified osteoarthritis, unspecified site: Secondary | ICD-10-CM | POA: Diagnosis not present

## 2022-08-22 DIAGNOSIS — H9190 Unspecified hearing loss, unspecified ear: Secondary | ICD-10-CM | POA: Diagnosis not present

## 2022-08-22 DIAGNOSIS — E782 Mixed hyperlipidemia: Secondary | ICD-10-CM | POA: Diagnosis not present

## 2022-08-22 DIAGNOSIS — D649 Anemia, unspecified: Secondary | ICD-10-CM | POA: Diagnosis not present

## 2022-08-24 DIAGNOSIS — I110 Hypertensive heart disease with heart failure: Secondary | ICD-10-CM | POA: Diagnosis not present

## 2022-08-24 DIAGNOSIS — G309 Alzheimer's disease, unspecified: Secondary | ICD-10-CM | POA: Diagnosis not present

## 2022-08-24 DIAGNOSIS — F02B18 Dementia in other diseases classified elsewhere, moderate, with other behavioral disturbance: Secondary | ICD-10-CM | POA: Diagnosis not present

## 2022-08-24 DIAGNOSIS — E1142 Type 2 diabetes mellitus with diabetic polyneuropathy: Secondary | ICD-10-CM | POA: Diagnosis not present

## 2022-08-24 DIAGNOSIS — I5042 Chronic combined systolic (congestive) and diastolic (congestive) heart failure: Secondary | ICD-10-CM | POA: Diagnosis not present

## 2022-08-24 DIAGNOSIS — G9341 Metabolic encephalopathy: Secondary | ICD-10-CM | POA: Diagnosis not present

## 2022-08-25 DIAGNOSIS — G309 Alzheimer's disease, unspecified: Secondary | ICD-10-CM | POA: Diagnosis not present

## 2022-08-25 DIAGNOSIS — G9341 Metabolic encephalopathy: Secondary | ICD-10-CM | POA: Diagnosis not present

## 2022-08-25 DIAGNOSIS — I110 Hypertensive heart disease with heart failure: Secondary | ICD-10-CM | POA: Diagnosis not present

## 2022-08-25 DIAGNOSIS — E1142 Type 2 diabetes mellitus with diabetic polyneuropathy: Secondary | ICD-10-CM | POA: Diagnosis not present

## 2022-08-25 DIAGNOSIS — I5042 Chronic combined systolic (congestive) and diastolic (congestive) heart failure: Secondary | ICD-10-CM | POA: Diagnosis not present

## 2022-08-25 DIAGNOSIS — F02B18 Dementia in other diseases classified elsewhere, moderate, with other behavioral disturbance: Secondary | ICD-10-CM | POA: Diagnosis not present

## 2022-08-31 DIAGNOSIS — I110 Hypertensive heart disease with heart failure: Secondary | ICD-10-CM | POA: Diagnosis not present

## 2022-08-31 DIAGNOSIS — E1142 Type 2 diabetes mellitus with diabetic polyneuropathy: Secondary | ICD-10-CM | POA: Diagnosis not present

## 2022-08-31 DIAGNOSIS — G9341 Metabolic encephalopathy: Secondary | ICD-10-CM | POA: Diagnosis not present

## 2022-08-31 DIAGNOSIS — F02B18 Dementia in other diseases classified elsewhere, moderate, with other behavioral disturbance: Secondary | ICD-10-CM | POA: Diagnosis not present

## 2022-08-31 DIAGNOSIS — I5042 Chronic combined systolic (congestive) and diastolic (congestive) heart failure: Secondary | ICD-10-CM | POA: Diagnosis not present

## 2022-08-31 DIAGNOSIS — G309 Alzheimer's disease, unspecified: Secondary | ICD-10-CM | POA: Diagnosis not present

## 2022-09-02 DIAGNOSIS — E1142 Type 2 diabetes mellitus with diabetic polyneuropathy: Secondary | ICD-10-CM | POA: Diagnosis not present

## 2022-09-02 DIAGNOSIS — G9341 Metabolic encephalopathy: Secondary | ICD-10-CM | POA: Diagnosis not present

## 2022-09-02 DIAGNOSIS — F02B18 Dementia in other diseases classified elsewhere, moderate, with other behavioral disturbance: Secondary | ICD-10-CM | POA: Diagnosis not present

## 2022-09-02 DIAGNOSIS — G309 Alzheimer's disease, unspecified: Secondary | ICD-10-CM | POA: Diagnosis not present

## 2022-09-02 DIAGNOSIS — I110 Hypertensive heart disease with heart failure: Secondary | ICD-10-CM | POA: Diagnosis not present

## 2022-09-02 DIAGNOSIS — I5042 Chronic combined systolic (congestive) and diastolic (congestive) heart failure: Secondary | ICD-10-CM | POA: Diagnosis not present

## 2022-09-08 DIAGNOSIS — I5042 Chronic combined systolic (congestive) and diastolic (congestive) heart failure: Secondary | ICD-10-CM | POA: Diagnosis not present

## 2022-09-08 DIAGNOSIS — F02B18 Dementia in other diseases classified elsewhere, moderate, with other behavioral disturbance: Secondary | ICD-10-CM | POA: Diagnosis not present

## 2022-09-08 DIAGNOSIS — G9341 Metabolic encephalopathy: Secondary | ICD-10-CM | POA: Diagnosis not present

## 2022-09-08 DIAGNOSIS — I110 Hypertensive heart disease with heart failure: Secondary | ICD-10-CM | POA: Diagnosis not present

## 2022-09-08 DIAGNOSIS — E1142 Type 2 diabetes mellitus with diabetic polyneuropathy: Secondary | ICD-10-CM | POA: Diagnosis not present

## 2022-09-08 DIAGNOSIS — G309 Alzheimer's disease, unspecified: Secondary | ICD-10-CM | POA: Diagnosis not present

## 2022-09-09 DIAGNOSIS — G9341 Metabolic encephalopathy: Secondary | ICD-10-CM | POA: Diagnosis not present

## 2022-09-09 DIAGNOSIS — E1142 Type 2 diabetes mellitus with diabetic polyneuropathy: Secondary | ICD-10-CM | POA: Diagnosis not present

## 2022-09-09 DIAGNOSIS — F02B18 Dementia in other diseases classified elsewhere, moderate, with other behavioral disturbance: Secondary | ICD-10-CM | POA: Diagnosis not present

## 2022-09-09 DIAGNOSIS — I5042 Chronic combined systolic (congestive) and diastolic (congestive) heart failure: Secondary | ICD-10-CM | POA: Diagnosis not present

## 2022-09-09 DIAGNOSIS — G309 Alzheimer's disease, unspecified: Secondary | ICD-10-CM | POA: Diagnosis not present

## 2022-09-09 DIAGNOSIS — I110 Hypertensive heart disease with heart failure: Secondary | ICD-10-CM | POA: Diagnosis not present

## 2022-09-17 DIAGNOSIS — I110 Hypertensive heart disease with heart failure: Secondary | ICD-10-CM | POA: Diagnosis not present

## 2022-09-17 DIAGNOSIS — E1142 Type 2 diabetes mellitus with diabetic polyneuropathy: Secondary | ICD-10-CM | POA: Diagnosis not present

## 2022-09-17 DIAGNOSIS — I5042 Chronic combined systolic (congestive) and diastolic (congestive) heart failure: Secondary | ICD-10-CM | POA: Diagnosis not present

## 2022-09-17 DIAGNOSIS — G9341 Metabolic encephalopathy: Secondary | ICD-10-CM | POA: Diagnosis not present

## 2022-09-17 DIAGNOSIS — F02B18 Dementia in other diseases classified elsewhere, moderate, with other behavioral disturbance: Secondary | ICD-10-CM | POA: Diagnosis not present

## 2022-09-17 DIAGNOSIS — G309 Alzheimer's disease, unspecified: Secondary | ICD-10-CM | POA: Diagnosis not present

## 2022-09-21 ENCOUNTER — Ambulatory Visit: Payer: Medicare Other | Admitting: Podiatry

## 2022-09-21 DIAGNOSIS — I110 Hypertensive heart disease with heart failure: Secondary | ICD-10-CM | POA: Diagnosis not present

## 2022-09-21 DIAGNOSIS — H9190 Unspecified hearing loss, unspecified ear: Secondary | ICD-10-CM | POA: Diagnosis not present

## 2022-09-21 DIAGNOSIS — F02B18 Dementia in other diseases classified elsewhere, moderate, with other behavioral disturbance: Secondary | ICD-10-CM | POA: Diagnosis not present

## 2022-09-21 DIAGNOSIS — I272 Pulmonary hypertension, unspecified: Secondary | ICD-10-CM | POA: Diagnosis not present

## 2022-09-21 DIAGNOSIS — Z7984 Long term (current) use of oral hypoglycemic drugs: Secondary | ICD-10-CM | POA: Diagnosis not present

## 2022-09-21 DIAGNOSIS — G309 Alzheimer's disease, unspecified: Secondary | ICD-10-CM | POA: Diagnosis not present

## 2022-09-21 DIAGNOSIS — M1990 Unspecified osteoarthritis, unspecified site: Secondary | ICD-10-CM | POA: Diagnosis not present

## 2022-09-21 DIAGNOSIS — H8110 Benign paroxysmal vertigo, unspecified ear: Secondary | ICD-10-CM | POA: Diagnosis not present

## 2022-09-21 DIAGNOSIS — E46 Unspecified protein-calorie malnutrition: Secondary | ICD-10-CM | POA: Diagnosis not present

## 2022-09-21 DIAGNOSIS — F02B4 Dementia in other diseases classified elsewhere, moderate, with anxiety: Secondary | ICD-10-CM | POA: Diagnosis not present

## 2022-09-21 DIAGNOSIS — E1142 Type 2 diabetes mellitus with diabetic polyneuropathy: Secondary | ICD-10-CM | POA: Diagnosis not present

## 2022-09-21 DIAGNOSIS — E782 Mixed hyperlipidemia: Secondary | ICD-10-CM | POA: Diagnosis not present

## 2022-09-21 DIAGNOSIS — H3530 Unspecified macular degeneration: Secondary | ICD-10-CM | POA: Diagnosis not present

## 2022-09-21 DIAGNOSIS — Z7982 Long term (current) use of aspirin: Secondary | ICD-10-CM | POA: Diagnosis not present

## 2022-09-21 DIAGNOSIS — F02B3 Dementia in other diseases classified elsewhere, moderate, with mood disturbance: Secondary | ICD-10-CM | POA: Diagnosis not present

## 2022-09-21 DIAGNOSIS — H04129 Dry eye syndrome of unspecified lacrimal gland: Secondary | ICD-10-CM | POA: Diagnosis not present

## 2022-09-21 DIAGNOSIS — H409 Unspecified glaucoma: Secondary | ICD-10-CM | POA: Diagnosis not present

## 2022-09-21 DIAGNOSIS — I5042 Chronic combined systolic (congestive) and diastolic (congestive) heart failure: Secondary | ICD-10-CM | POA: Diagnosis not present

## 2022-09-21 DIAGNOSIS — E1151 Type 2 diabetes mellitus with diabetic peripheral angiopathy without gangrene: Secondary | ICD-10-CM | POA: Diagnosis not present

## 2022-09-21 DIAGNOSIS — I442 Atrioventricular block, complete: Secondary | ICD-10-CM | POA: Diagnosis not present

## 2022-09-21 DIAGNOSIS — E1169 Type 2 diabetes mellitus with other specified complication: Secondary | ICD-10-CM | POA: Diagnosis not present

## 2022-09-21 DIAGNOSIS — B009 Herpesviral infection, unspecified: Secondary | ICD-10-CM | POA: Diagnosis not present

## 2022-09-21 DIAGNOSIS — G9341 Metabolic encephalopathy: Secondary | ICD-10-CM | POA: Diagnosis not present

## 2022-09-21 DIAGNOSIS — K5790 Diverticulosis of intestine, part unspecified, without perforation or abscess without bleeding: Secondary | ICD-10-CM | POA: Diagnosis not present

## 2022-09-21 DIAGNOSIS — D649 Anemia, unspecified: Secondary | ICD-10-CM | POA: Diagnosis not present

## 2022-09-23 ENCOUNTER — Encounter: Payer: Self-pay | Admitting: Podiatry

## 2022-09-23 ENCOUNTER — Ambulatory Visit (INDEPENDENT_AMBULATORY_CARE_PROVIDER_SITE_OTHER): Payer: Medicare Other | Admitting: Podiatry

## 2022-09-23 DIAGNOSIS — E1142 Type 2 diabetes mellitus with diabetic polyneuropathy: Secondary | ICD-10-CM

## 2022-09-23 DIAGNOSIS — B351 Tinea unguium: Secondary | ICD-10-CM | POA: Diagnosis not present

## 2022-09-23 DIAGNOSIS — M79609 Pain in unspecified limb: Secondary | ICD-10-CM

## 2022-09-23 NOTE — Progress Notes (Signed)
  Subjective:  Patient ID: Maria [name], female    DOB: [birth year],  MRN: [id number]  Maria [name] presents to clinic today for at risk foot care with history of diabetic neuropathy and painful elongated mycotic toenails 1-5 bilaterally which are tender when wearing enclosed shoe gear. Pain is relieved with periodic professional debridement.  Last known HgA1c was about 7%. Patient did not check blood glucose today.  New problem(s): None.   PCP is Audley Hose, MD , and last visit was Jan 19, 2022.  Patient is a patient of Dr. Leeanne Rio.  Allergies  Allergen Reactions   Vancomycin Itching   Review of Systems: Negative except as noted in the HPI.  Objective: No changes noted in today's physical examination.  Vascular Examination: Vascular status: Faintly palpable pedal pulses. Pedal hair present b/l. CFT <3 seconds. No edema. No pain with calf compression b/l. Skin temperature gradient WNL b/l.   Neurological Examination: Sensation grossly intact b/l with 10 gram monofilament. Vibratory sensation intact b/l.   Dermatological Examination: Pedal skin with normal turgor, texture and tone b/l. Toenails 1-5 b/l thick, discolored, elongated with subungual debris and pain on dorsal palpation. No hyperkeratotic lesions noted b/l.   Musculoskeletal Examination: Muscle strength 4/5 to all lower extremity muscle groups bilaterally. Hammertoe deformity noted 2-5 b/l.  Radiographs: None  Assessment/Plan: No diagnosis found.   -Patient was evaluated and treated. All patient's and/or POA's questions/concerns answered on today's visit. -Patient to continue soft, supportive shoe gear daily. -Mycotic toenails 1-5 bilaterally were debrided in length and girth with sterile nail nippers and dremel without incident. -Patient/POA to call should there be question/concern in the interim.   Return in about 3 months (around 12/23/2022).  Felipa Furnace, DPM

## 2022-09-28 DIAGNOSIS — M25561 Pain in right knee: Secondary | ICD-10-CM | POA: Diagnosis not present

## 2022-09-28 DIAGNOSIS — E1165 Type 2 diabetes mellitus with hyperglycemia: Secondary | ICD-10-CM | POA: Diagnosis not present

## 2022-09-28 DIAGNOSIS — E0821 Diabetes mellitus due to underlying condition with diabetic nephropathy: Secondary | ICD-10-CM | POA: Diagnosis not present

## 2022-09-28 DIAGNOSIS — E042 Nontoxic multinodular goiter: Secondary | ICD-10-CM | POA: Diagnosis not present

## 2022-09-28 DIAGNOSIS — R6889 Other general symptoms and signs: Secondary | ICD-10-CM | POA: Diagnosis not present

## 2022-09-28 DIAGNOSIS — E1142 Type 2 diabetes mellitus with diabetic polyneuropathy: Secondary | ICD-10-CM | POA: Diagnosis not present

## 2022-09-28 DIAGNOSIS — F03911 Unspecified dementia, unspecified severity, with agitation: Secondary | ICD-10-CM | POA: Diagnosis not present

## 2022-09-28 DIAGNOSIS — R5383 Other fatigue: Secondary | ICD-10-CM | POA: Diagnosis not present

## 2022-09-28 DIAGNOSIS — R8290 Unspecified abnormal findings in urine: Secondary | ICD-10-CM | POA: Diagnosis not present

## 2022-09-28 DIAGNOSIS — M25562 Pain in left knee: Secondary | ICD-10-CM | POA: Diagnosis not present

## 2022-09-28 DIAGNOSIS — E785 Hyperlipidemia, unspecified: Secondary | ICD-10-CM | POA: Diagnosis not present

## 2022-09-28 DIAGNOSIS — F5101 Primary insomnia: Secondary | ICD-10-CM | POA: Diagnosis not present

## 2022-09-28 DIAGNOSIS — Z758 Other problems related to medical facilities and other health care: Secondary | ICD-10-CM | POA: Diagnosis not present

## 2022-09-28 DIAGNOSIS — E782 Mixed hyperlipidemia: Secondary | ICD-10-CM | POA: Diagnosis not present

## 2022-09-29 DIAGNOSIS — F02B18 Dementia in other diseases classified elsewhere, moderate, with other behavioral disturbance: Secondary | ICD-10-CM | POA: Diagnosis not present

## 2022-09-29 DIAGNOSIS — G309 Alzheimer's disease, unspecified: Secondary | ICD-10-CM | POA: Diagnosis not present

## 2022-09-29 DIAGNOSIS — I5042 Chronic combined systolic (congestive) and diastolic (congestive) heart failure: Secondary | ICD-10-CM | POA: Diagnosis not present

## 2022-09-29 DIAGNOSIS — G9341 Metabolic encephalopathy: Secondary | ICD-10-CM | POA: Diagnosis not present

## 2022-09-29 DIAGNOSIS — I110 Hypertensive heart disease with heart failure: Secondary | ICD-10-CM | POA: Diagnosis not present

## 2022-09-29 DIAGNOSIS — E1142 Type 2 diabetes mellitus with diabetic polyneuropathy: Secondary | ICD-10-CM | POA: Diagnosis not present

## 2022-10-06 DIAGNOSIS — F02B18 Dementia in other diseases classified elsewhere, moderate, with other behavioral disturbance: Secondary | ICD-10-CM | POA: Diagnosis not present

## 2022-10-06 DIAGNOSIS — G9341 Metabolic encephalopathy: Secondary | ICD-10-CM | POA: Diagnosis not present

## 2022-10-06 DIAGNOSIS — G309 Alzheimer's disease, unspecified: Secondary | ICD-10-CM | POA: Diagnosis not present

## 2022-10-06 DIAGNOSIS — E1142 Type 2 diabetes mellitus with diabetic polyneuropathy: Secondary | ICD-10-CM | POA: Diagnosis not present

## 2022-10-06 DIAGNOSIS — I110 Hypertensive heart disease with heart failure: Secondary | ICD-10-CM | POA: Diagnosis not present

## 2022-10-06 DIAGNOSIS — I5042 Chronic combined systolic (congestive) and diastolic (congestive) heart failure: Secondary | ICD-10-CM | POA: Diagnosis not present

## 2022-10-13 DIAGNOSIS — I5042 Chronic combined systolic (congestive) and diastolic (congestive) heart failure: Secondary | ICD-10-CM | POA: Diagnosis not present

## 2022-10-13 DIAGNOSIS — G9341 Metabolic encephalopathy: Secondary | ICD-10-CM | POA: Diagnosis not present

## 2022-10-13 DIAGNOSIS — F02B18 Dementia in other diseases classified elsewhere, moderate, with other behavioral disturbance: Secondary | ICD-10-CM | POA: Diagnosis not present

## 2022-10-13 DIAGNOSIS — G309 Alzheimer's disease, unspecified: Secondary | ICD-10-CM | POA: Diagnosis not present

## 2022-10-13 DIAGNOSIS — E1142 Type 2 diabetes mellitus with diabetic polyneuropathy: Secondary | ICD-10-CM | POA: Diagnosis not present

## 2022-10-13 DIAGNOSIS — I110 Hypertensive heart disease with heart failure: Secondary | ICD-10-CM | POA: Diagnosis not present

## 2022-10-19 DIAGNOSIS — F02B18 Dementia in other diseases classified elsewhere, moderate, with other behavioral disturbance: Secondary | ICD-10-CM | POA: Diagnosis not present

## 2022-10-19 DIAGNOSIS — I5042 Chronic combined systolic (congestive) and diastolic (congestive) heart failure: Secondary | ICD-10-CM | POA: Diagnosis not present

## 2022-10-19 DIAGNOSIS — G309 Alzheimer's disease, unspecified: Secondary | ICD-10-CM | POA: Diagnosis not present

## 2022-10-19 DIAGNOSIS — E1142 Type 2 diabetes mellitus with diabetic polyneuropathy: Secondary | ICD-10-CM | POA: Diagnosis not present

## 2022-10-19 DIAGNOSIS — I110 Hypertensive heart disease with heart failure: Secondary | ICD-10-CM | POA: Diagnosis not present

## 2022-10-19 DIAGNOSIS — G9341 Metabolic encephalopathy: Secondary | ICD-10-CM | POA: Diagnosis not present

## 2022-10-20 ENCOUNTER — Ambulatory Visit (INDEPENDENT_AMBULATORY_CARE_PROVIDER_SITE_OTHER): Payer: Medicare Other

## 2022-10-20 DIAGNOSIS — I442 Atrioventricular block, complete: Secondary | ICD-10-CM | POA: Diagnosis not present

## 2022-10-20 LAB — CUP PACEART REMOTE DEVICE CHECK
Battery Remaining Longevity: 97 mo
Battery Voltage: 3 V
Brady Statistic AP VP Percent: 12.76 %
Brady Statistic AP VS Percent: 0 %
Brady Statistic AS VP Percent: 85.69 %
Brady Statistic AS VS Percent: 1.54 %
Brady Statistic RA Percent Paced: 13.39 %
Brady Statistic RV Percent Paced: 98.46 %
Date Time Interrogation Session: 20240207025323
Implantable Lead Connection Status: 753985
Implantable Lead Connection Status: 753985
Implantable Lead Implant Date: 20030311
Implantable Lead Implant Date: 20030311
Implantable Lead Location: 753859
Implantable Lead Location: 753860
Implantable Lead Model: 4092
Implantable Lead Model: 4592
Implantable Pulse Generator Implant Date: 20200803
Lead Channel Impedance Value: 399 Ohm
Lead Channel Impedance Value: 475 Ohm
Lead Channel Impedance Value: 475 Ohm
Lead Channel Impedance Value: 513 Ohm
Lead Channel Pacing Threshold Amplitude: 0.375 V
Lead Channel Pacing Threshold Amplitude: 0.625 V
Lead Channel Pacing Threshold Pulse Width: 0.4 ms
Lead Channel Pacing Threshold Pulse Width: 0.4 ms
Lead Channel Sensing Intrinsic Amplitude: 2 mV
Lead Channel Sensing Intrinsic Amplitude: 2 mV
Lead Channel Sensing Intrinsic Amplitude: 5.375 mV
Lead Channel Sensing Intrinsic Amplitude: 5.375 mV
Lead Channel Setting Pacing Amplitude: 1.5 V
Lead Channel Setting Pacing Amplitude: 2.5 V
Lead Channel Setting Pacing Pulse Width: 0.4 ms
Lead Channel Setting Sensing Sensitivity: 4 mV
Zone Setting Status: 755011
Zone Setting Status: 755011

## 2022-10-21 DIAGNOSIS — H8110 Benign paroxysmal vertigo, unspecified ear: Secondary | ICD-10-CM | POA: Diagnosis not present

## 2022-10-21 DIAGNOSIS — E782 Mixed hyperlipidemia: Secondary | ICD-10-CM | POA: Diagnosis not present

## 2022-10-21 DIAGNOSIS — Z7984 Long term (current) use of oral hypoglycemic drugs: Secondary | ICD-10-CM | POA: Diagnosis not present

## 2022-10-21 DIAGNOSIS — F02B4 Dementia in other diseases classified elsewhere, moderate, with anxiety: Secondary | ICD-10-CM | POA: Diagnosis not present

## 2022-10-21 DIAGNOSIS — F02B18 Dementia in other diseases classified elsewhere, moderate, with other behavioral disturbance: Secondary | ICD-10-CM | POA: Diagnosis not present

## 2022-10-21 DIAGNOSIS — D649 Anemia, unspecified: Secondary | ICD-10-CM | POA: Diagnosis not present

## 2022-10-21 DIAGNOSIS — H3530 Unspecified macular degeneration: Secondary | ICD-10-CM | POA: Diagnosis not present

## 2022-10-21 DIAGNOSIS — B009 Herpesviral infection, unspecified: Secondary | ICD-10-CM | POA: Diagnosis not present

## 2022-10-21 DIAGNOSIS — H04129 Dry eye syndrome of unspecified lacrimal gland: Secondary | ICD-10-CM | POA: Diagnosis not present

## 2022-10-21 DIAGNOSIS — E1151 Type 2 diabetes mellitus with diabetic peripheral angiopathy without gangrene: Secondary | ICD-10-CM | POA: Diagnosis not present

## 2022-10-21 DIAGNOSIS — E1142 Type 2 diabetes mellitus with diabetic polyneuropathy: Secondary | ICD-10-CM | POA: Diagnosis not present

## 2022-10-21 DIAGNOSIS — H9190 Unspecified hearing loss, unspecified ear: Secondary | ICD-10-CM | POA: Diagnosis not present

## 2022-10-21 DIAGNOSIS — M1990 Unspecified osteoarthritis, unspecified site: Secondary | ICD-10-CM | POA: Diagnosis not present

## 2022-10-21 DIAGNOSIS — G309 Alzheimer's disease, unspecified: Secondary | ICD-10-CM | POA: Diagnosis not present

## 2022-10-21 DIAGNOSIS — I110 Hypertensive heart disease with heart failure: Secondary | ICD-10-CM | POA: Diagnosis not present

## 2022-10-21 DIAGNOSIS — E46 Unspecified protein-calorie malnutrition: Secondary | ICD-10-CM | POA: Diagnosis not present

## 2022-10-21 DIAGNOSIS — K5790 Diverticulosis of intestine, part unspecified, without perforation or abscess without bleeding: Secondary | ICD-10-CM | POA: Diagnosis not present

## 2022-10-21 DIAGNOSIS — Z7982 Long term (current) use of aspirin: Secondary | ICD-10-CM | POA: Diagnosis not present

## 2022-10-21 DIAGNOSIS — I272 Pulmonary hypertension, unspecified: Secondary | ICD-10-CM | POA: Diagnosis not present

## 2022-10-21 DIAGNOSIS — H409 Unspecified glaucoma: Secondary | ICD-10-CM | POA: Diagnosis not present

## 2022-10-21 DIAGNOSIS — G9341 Metabolic encephalopathy: Secondary | ICD-10-CM | POA: Diagnosis not present

## 2022-10-21 DIAGNOSIS — F02B3 Dementia in other diseases classified elsewhere, moderate, with mood disturbance: Secondary | ICD-10-CM | POA: Diagnosis not present

## 2022-10-21 DIAGNOSIS — I5042 Chronic combined systolic (congestive) and diastolic (congestive) heart failure: Secondary | ICD-10-CM | POA: Diagnosis not present

## 2022-10-21 DIAGNOSIS — I442 Atrioventricular block, complete: Secondary | ICD-10-CM | POA: Diagnosis not present

## 2022-10-21 DIAGNOSIS — E1169 Type 2 diabetes mellitus with other specified complication: Secondary | ICD-10-CM | POA: Diagnosis not present

## 2022-10-27 DIAGNOSIS — G309 Alzheimer's disease, unspecified: Secondary | ICD-10-CM | POA: Diagnosis not present

## 2022-10-27 DIAGNOSIS — E1142 Type 2 diabetes mellitus with diabetic polyneuropathy: Secondary | ICD-10-CM | POA: Diagnosis not present

## 2022-10-27 DIAGNOSIS — I5042 Chronic combined systolic (congestive) and diastolic (congestive) heart failure: Secondary | ICD-10-CM | POA: Diagnosis not present

## 2022-10-27 DIAGNOSIS — I110 Hypertensive heart disease with heart failure: Secondary | ICD-10-CM | POA: Diagnosis not present

## 2022-10-27 DIAGNOSIS — F02B18 Dementia in other diseases classified elsewhere, moderate, with other behavioral disturbance: Secondary | ICD-10-CM | POA: Diagnosis not present

## 2022-10-27 DIAGNOSIS — G9341 Metabolic encephalopathy: Secondary | ICD-10-CM | POA: Diagnosis not present

## 2022-11-04 DIAGNOSIS — G309 Alzheimer's disease, unspecified: Secondary | ICD-10-CM | POA: Diagnosis not present

## 2022-11-04 DIAGNOSIS — I110 Hypertensive heart disease with heart failure: Secondary | ICD-10-CM | POA: Diagnosis not present

## 2022-11-04 DIAGNOSIS — G9341 Metabolic encephalopathy: Secondary | ICD-10-CM | POA: Diagnosis not present

## 2022-11-04 DIAGNOSIS — E1142 Type 2 diabetes mellitus with diabetic polyneuropathy: Secondary | ICD-10-CM | POA: Diagnosis not present

## 2022-11-04 DIAGNOSIS — F02B18 Dementia in other diseases classified elsewhere, moderate, with other behavioral disturbance: Secondary | ICD-10-CM | POA: Diagnosis not present

## 2022-11-04 DIAGNOSIS — I5042 Chronic combined systolic (congestive) and diastolic (congestive) heart failure: Secondary | ICD-10-CM | POA: Diagnosis not present

## 2022-11-05 ENCOUNTER — Emergency Department (HOSPITAL_COMMUNITY): Payer: Medicare Other

## 2022-11-05 ENCOUNTER — Encounter (HOSPITAL_COMMUNITY): Payer: Self-pay | Admitting: Internal Medicine

## 2022-11-05 ENCOUNTER — Inpatient Hospital Stay (HOSPITAL_COMMUNITY)
Admission: EM | Admit: 2022-11-05 | Discharge: 2022-11-11 | DRG: 690 | Disposition: A | Discharge status: Skilled Nursing Facility | Payer: Medicare Other | Attending: Internal Medicine | Admitting: Internal Medicine

## 2022-11-05 DIAGNOSIS — E119 Type 2 diabetes mellitus without complications: Secondary | ICD-10-CM | POA: Diagnosis present

## 2022-11-05 DIAGNOSIS — R4182 Altered mental status, unspecified: Secondary | ICD-10-CM

## 2022-11-05 DIAGNOSIS — Z7982 Long term (current) use of aspirin: Secondary | ICD-10-CM

## 2022-11-05 DIAGNOSIS — K219 Gastro-esophageal reflux disease without esophagitis: Secondary | ICD-10-CM | POA: Diagnosis not present

## 2022-11-05 DIAGNOSIS — K5909 Other constipation: Secondary | ICD-10-CM | POA: Diagnosis present

## 2022-11-05 DIAGNOSIS — Z853 Personal history of malignant neoplasm of breast: Secondary | ICD-10-CM | POA: Diagnosis not present

## 2022-11-05 DIAGNOSIS — E785 Hyperlipidemia, unspecified: Secondary | ICD-10-CM | POA: Diagnosis present

## 2022-11-05 DIAGNOSIS — Z8673 Personal history of transient ischemic attack (TIA), and cerebral infarction without residual deficits: Secondary | ICD-10-CM

## 2022-11-05 DIAGNOSIS — R1312 Dysphagia, oropharyngeal phase: Secondary | ICD-10-CM | POA: Diagnosis not present

## 2022-11-05 DIAGNOSIS — Z950 Presence of cardiac pacemaker: Secondary | ICD-10-CM

## 2022-11-05 DIAGNOSIS — H3530 Unspecified macular degeneration: Secondary | ICD-10-CM | POA: Diagnosis not present

## 2022-11-05 DIAGNOSIS — Z7984 Long term (current) use of oral hypoglycemic drugs: Secondary | ICD-10-CM | POA: Diagnosis not present

## 2022-11-05 DIAGNOSIS — R000 Tachycardia, unspecified: Secondary | ICD-10-CM | POA: Diagnosis not present

## 2022-11-05 DIAGNOSIS — G9341 Metabolic encephalopathy: Secondary | ICD-10-CM | POA: Diagnosis not present

## 2022-11-05 DIAGNOSIS — Z881 Allergy status to other antibiotic agents status: Secondary | ICD-10-CM

## 2022-11-05 DIAGNOSIS — G309 Alzheimer's disease, unspecified: Secondary | ICD-10-CM | POA: Diagnosis present

## 2022-11-05 DIAGNOSIS — N390 Urinary tract infection, site not specified: Secondary | ICD-10-CM | POA: Diagnosis not present

## 2022-11-05 DIAGNOSIS — M6281 Muscle weakness (generalized): Secondary | ICD-10-CM | POA: Diagnosis not present

## 2022-11-05 DIAGNOSIS — Z9049 Acquired absence of other specified parts of digestive tract: Secondary | ICD-10-CM

## 2022-11-05 DIAGNOSIS — M6259 Muscle wasting and atrophy, not elsewhere classified, multiple sites: Secondary | ICD-10-CM | POA: Diagnosis not present

## 2022-11-05 DIAGNOSIS — M1990 Unspecified osteoarthritis, unspecified site: Secondary | ICD-10-CM | POA: Diagnosis present

## 2022-11-05 DIAGNOSIS — R17 Unspecified jaundice: Secondary | ICD-10-CM | POA: Diagnosis present

## 2022-11-05 DIAGNOSIS — R2681 Unsteadiness on feet: Secondary | ICD-10-CM | POA: Diagnosis not present

## 2022-11-05 DIAGNOSIS — Z96653 Presence of artificial knee joint, bilateral: Secondary | ICD-10-CM | POA: Diagnosis present

## 2022-11-05 DIAGNOSIS — R531 Weakness: Secondary | ICD-10-CM | POA: Diagnosis not present

## 2022-11-05 DIAGNOSIS — M25552 Pain in left hip: Secondary | ICD-10-CM | POA: Diagnosis present

## 2022-11-05 DIAGNOSIS — E440 Moderate protein-calorie malnutrition: Secondary | ICD-10-CM | POA: Diagnosis not present

## 2022-11-05 DIAGNOSIS — R64 Cachexia: Secondary | ICD-10-CM | POA: Diagnosis present

## 2022-11-05 DIAGNOSIS — E860 Dehydration: Secondary | ICD-10-CM | POA: Diagnosis present

## 2022-11-05 DIAGNOSIS — F0282 Dementia in other diseases classified elsewhere, unspecified severity, with psychotic disturbance: Secondary | ICD-10-CM | POA: Diagnosis present

## 2022-11-05 DIAGNOSIS — Z66 Do not resuscitate: Secondary | ICD-10-CM | POA: Diagnosis present

## 2022-11-05 DIAGNOSIS — Z79899 Other long term (current) drug therapy: Secondary | ICD-10-CM

## 2022-11-05 DIAGNOSIS — B965 Pseudomonas (aeruginosa) (mallei) (pseudomallei) as the cause of diseases classified elsewhere: Secondary | ICD-10-CM | POA: Diagnosis present

## 2022-11-05 DIAGNOSIS — I5032 Chronic diastolic (congestive) heart failure: Secondary | ICD-10-CM | POA: Diagnosis present

## 2022-11-05 DIAGNOSIS — R278 Other lack of coordination: Secondary | ICD-10-CM | POA: Diagnosis not present

## 2022-11-05 DIAGNOSIS — I10 Essential (primary) hypertension: Secondary | ICD-10-CM | POA: Diagnosis not present

## 2022-11-05 DIAGNOSIS — R0989 Other specified symptoms and signs involving the circulatory and respiratory systems: Secondary | ICD-10-CM | POA: Diagnosis not present

## 2022-11-05 DIAGNOSIS — M25551 Pain in right hip: Secondary | ICD-10-CM | POA: Diagnosis not present

## 2022-11-05 DIAGNOSIS — Z7401 Bed confinement status: Secondary | ICD-10-CM | POA: Diagnosis not present

## 2022-11-05 DIAGNOSIS — M25519 Pain in unspecified shoulder: Secondary | ICD-10-CM | POA: Diagnosis not present

## 2022-11-05 DIAGNOSIS — M25512 Pain in left shoulder: Secondary | ICD-10-CM | POA: Diagnosis present

## 2022-11-05 DIAGNOSIS — N3090 Cystitis, unspecified without hematuria: Principal | ICD-10-CM

## 2022-11-05 DIAGNOSIS — B009 Herpesviral infection, unspecified: Secondary | ICD-10-CM | POA: Diagnosis present

## 2022-11-05 DIAGNOSIS — R41841 Cognitive communication deficit: Secondary | ICD-10-CM | POA: Diagnosis not present

## 2022-11-05 DIAGNOSIS — I110 Hypertensive heart disease with heart failure: Secondary | ICD-10-CM | POA: Diagnosis present

## 2022-11-05 DIAGNOSIS — Z6822 Body mass index (BMI) 22.0-22.9, adult: Secondary | ICD-10-CM

## 2022-11-05 DIAGNOSIS — R2689 Other abnormalities of gait and mobility: Secondary | ICD-10-CM | POA: Diagnosis not present

## 2022-11-05 DIAGNOSIS — Z8249 Family history of ischemic heart disease and other diseases of the circulatory system: Secondary | ICD-10-CM

## 2022-11-05 DIAGNOSIS — R410 Disorientation, unspecified: Secondary | ICD-10-CM | POA: Diagnosis not present

## 2022-11-05 LAB — URINALYSIS, W/ REFLEX TO CULTURE (INFECTION SUSPECTED)
Bilirubin Urine: NEGATIVE
Glucose, UA: NEGATIVE mg/dL
Ketones, ur: 5 mg/dL — AB
Nitrite: POSITIVE — AB
Protein, ur: >=300 mg/dL — AB
Specific Gravity, Urine: 1.015 (ref 1.005–1.030)
pH: 5 (ref 5.0–8.0)

## 2022-11-05 LAB — APTT: aPTT: 35 seconds (ref 24–36)

## 2022-11-05 LAB — CBC WITH DIFFERENTIAL/PLATELET
Abs Immature Granulocytes: 0.03 10*3/uL (ref 0.00–0.07)
Basophils Absolute: 0 10*3/uL (ref 0.0–0.1)
Basophils Relative: 0 %
Eosinophils Absolute: 0 10*3/uL (ref 0.0–0.5)
Eosinophils Relative: 0 %
HCT: 45.6 % (ref 36.0–46.0)
Hemoglobin: 14.7 g/dL (ref 12.0–15.0)
Immature Granulocytes: 0 %
Lymphocytes Relative: 16 %
Lymphs Abs: 1.3 10*3/uL (ref 0.7–4.0)
MCH: 28 pg (ref 26.0–34.0)
MCHC: 32.2 g/dL (ref 30.0–36.0)
MCV: 86.9 fL (ref 80.0–100.0)
Monocytes Absolute: 0.4 10*3/uL (ref 0.1–1.0)
Monocytes Relative: 5 %
Neutro Abs: 6.3 10*3/uL (ref 1.7–7.7)
Neutrophils Relative %: 79 %
Platelets: 261 10*3/uL (ref 150–400)
RBC: 5.25 MIL/uL — ABNORMAL HIGH (ref 3.87–5.11)
RDW: 13.4 % (ref 11.5–15.5)
WBC: 8 10*3/uL (ref 4.0–10.5)
nRBC: 0 % (ref 0.0–0.2)

## 2022-11-05 LAB — TROPONIN I (HIGH SENSITIVITY)
Troponin I (High Sensitivity): 34 ng/L — ABNORMAL HIGH (ref <18)
Troponin I (High Sensitivity): 42 ng/L — ABNORMAL HIGH (ref <18)

## 2022-11-05 LAB — COMPREHENSIVE METABOLIC PANEL
ALT: 18 U/L (ref 0–44)
AST: 21 U/L (ref 15–41)
Albumin: 4.5 g/dL (ref 3.5–5.0)
Alkaline Phosphatase: 38 U/L (ref 38–126)
Anion gap: 13 (ref 5–15)
BUN: 16 mg/dL (ref 8–23)
CO2: 19 mmol/L — ABNORMAL LOW (ref 22–32)
Calcium: 9.4 mg/dL (ref 8.9–10.3)
Chloride: 103 mmol/L (ref 98–111)
Creatinine, Ser: 0.71 mg/dL (ref 0.44–1.00)
GFR, Estimated: >60 mL/min (ref >60)
Glucose, Bld: 163 mg/dL — ABNORMAL HIGH (ref 70–99)
Potassium: 4 mmol/L (ref 3.5–5.1)
Sodium: 135 mmol/L (ref 135–145)
Total Bilirubin: 1.5 mg/dL — ABNORMAL HIGH (ref 0.3–1.2)
Total Protein: 8.1 g/dL (ref 6.5–8.1)

## 2022-11-05 LAB — LACTIC ACID, PLASMA: Lactic Acid, Venous: 2.9 mmol/L (ref 0.5–1.9)

## 2022-11-05 LAB — ETHANOL: Alcohol, Ethyl (B): <10 mg/dL (ref <10)

## 2022-11-05 LAB — BRAIN NATRIURETIC PEPTIDE: B Natriuretic Peptide: 290.3 pg/mL — ABNORMAL HIGH (ref 0.0–100.0)

## 2022-11-05 LAB — RAPID URINE DRUG SCREEN, HOSP PERFORMED
Amphetamines: NOT DETECTED
Barbiturates: NOT DETECTED
Benzodiazepines: NOT DETECTED
Cocaine: NOT DETECTED
Opiates: NOT DETECTED
Tetrahydrocannabinol: NOT DETECTED

## 2022-11-05 LAB — AMMONIA: Ammonia: 17 umol/L (ref 9–35)

## 2022-11-05 LAB — CBG MONITORING, ED: Glucose-Capillary: 149 mg/dL — ABNORMAL HIGH (ref 70–99)

## 2022-11-05 LAB — MAGNESIUM: Magnesium: 1.4 mg/dL — ABNORMAL LOW (ref 1.7–2.4)

## 2022-11-05 LAB — PROTIME-INR
INR: 1.2 (ref 0.8–1.2)
Prothrombin Time: 14.8 seconds (ref 11.4–15.2)

## 2022-11-05 MED ORDER — LACTATED RINGERS IV BOLUS
500.0000 mL | Freq: Once | INTRAVENOUS | Status: AC
  Administered 2022-11-05: 500 mL via INTRAVENOUS

## 2022-11-05 MED ORDER — SODIUM CHLORIDE 0.9 % IV SOLN
1.0000 g | Freq: Once | INTRAVENOUS | Status: AC
  Administered 2022-11-05: 1 g via INTRAVENOUS
  Filled 2022-11-05: qty 10

## 2022-11-05 MED ORDER — LOSARTAN POTASSIUM 50 MG PO TABS: 50.0000 mg | ORAL_TABLET | Freq: Every day | ORAL | Status: DC

## 2022-11-05 MED ORDER — ENOXAPARIN SODIUM 40 MG/0.4ML IJ SOSY
40.0000 mg | PREFILLED_SYRINGE | INTRAMUSCULAR | Status: DC
  Administered 2022-11-05 – 2022-11-10 (×6): 40 mg via SUBCUTANEOUS
  Filled 2022-11-05 (×6): qty 0.4

## 2022-11-05 MED ORDER — MAGNESIUM SULFATE 2 GM/50ML IV SOLN
2.0000 g | Freq: Once | INTRAVENOUS | Status: AC
  Administered 2022-11-05: 2 g via INTRAVENOUS
  Filled 2022-11-05: qty 50

## 2022-11-05 MED ORDER — LABETALOL HCL 5 MG/ML IV SOLN
5.0000 mg | INTRAVENOUS | Status: DC | PRN
  Administered 2022-11-05: 5 mg via INTRAVENOUS
  Filled 2022-11-05: qty 4

## 2022-11-05 MED ORDER — SODIUM CHLORIDE 0.9 % IV BOLUS
1000.0000 mL | Freq: Once | INTRAVENOUS | Status: AC
  Administered 2022-11-05: 1000 mL via INTRAVENOUS

## 2022-11-05 MED ORDER — SODIUM CHLORIDE 0.9 % IV SOLN
1.0000 g | INTRAVENOUS | Status: DC
  Administered 2022-11-06: 1 g via INTRAVENOUS
  Filled 2022-11-05 (×2): qty 10

## 2022-11-05 NOTE — ED Notes (Signed)
Pt feeling more SOB and requesting. 02.  Placed on 2L. Lasix given and urinal provided

## 2022-11-05 NOTE — ED Provider Notes (Signed)
[facility] Provider Note   CSN: [id number] Arrival date & time: 11/05/22  1129     History  No chief complaint on file.   Maria [name] is a 89 y.o. female.  With a significant history including but not limited to anxiety, hypertension, pulmonary hypertension, arthritis of multiple joints, type 2 diabetes, pacemaker who presents to the ED for evaluation of altered mental status.  Patient is significantly altered and lethargic and will not contribute to her own history.  EMS reports that when they arrived on scene and she was, also complaining of left shoulder pain and leaning to the left side.  They state that she had no significant neurologic deficits at that time.  Patient would respond to a few yes or no questions.  She denies fevers, chills, chest pain or shortness of breath.  Spoke with patient's legal guardian, daughter Gwenlyn Saran, who states that patient was last known completely normal greater than 1 week ago.  Has been progressively getting slightly more altered.  When she visited the patient today she noticed that the patient was sitting down in her walker and leaning to the left.  Was complaining of some left shoulder pain at that time.  Daughter states that patient was alert and oriented and was following commands for EMS at that time.  She says that the patient has not been complaining of anything abnormal recently but typically complains of left shoulder and bilateral hip pain.  HPI     Home Medications Prior to Admission medications   Medication Sig Start Date End Date Taking? Authorizing Provider  acetaminophen (TYLENOL) 650 MG CR tablet Take 1,300 mg by mouth in the morning and at bedtime.    [provider]  aspirin EC 81 MG tablet Take 81 mg by mouth daily.    [provider]  carboxymethylcellul-glycerin (OPTIVE) 0.5-0.9 % ophthalmic solution Place 1 drop into both eyes daily.    [provider]   cycloSPORINE (RESTASIS) 0.05 % ophthalmic emulsion Place 1 drop into both eyes daily.     [provider]  donepezil (ARICEPT) 10 MG tablet Take 10 mg by mouth at bedtime.    [provider]  glimepiride (AMARYL) 1 MG tablet Take 1 mg by mouth in the morning and at bedtime. 10/23/19   [provider]  latanoprost (XALATAN) 0.005 % ophthalmic solution Place 1 drop into both eyes at bedtime. 06/10/20   [provider]  losartan (COZAAR) 50 MG tablet Take 50 mg by mouth daily. 05/26/21   [provider]  meclizine (ANTIVERT) 25 MG tablet Take 25 mg by mouth 3 (three) times daily as needed (vertigo).     [provider]  meloxicam (MOBIC) 7.5 MG tablet Take 7.5 mg by mouth daily as needed (for arthritis pain).    [provider]  metFORMIN (GLUCOPHAGE) 1000 MG tablet Take 1,000 mg by mouth daily with breakfast. 01/08/21   [provider]  Multiple Vitamins-Minerals (PRESERVISION AREDS 2+MULTI VIT PO) Take 1 capsule by mouth daily. Patient not taking: Reported on 07/08/2022    [provider]  nitroGLYCERIN (NITROSTAT) 0.4 MG SL tablet Place 0.4 mg under the tongue every 5 (five) minutes as needed for chest pain. Patient not taking: Reported on 07/08/2022 06/10/20   [provider]  ondansetron (ZOFRAN-ODT) 4 MG disintegrating tablet '4mg'$  ODT q4 hours prn nausea/vomit 06/24/22   Drenda Freeze, MD  pantoprazole (PROTONIX) 20 MG tablet Take 1  tablet (20 mg total) by mouth daily. Patient not taking: Reported on 07/08/2022 06/24/22   Drenda Freeze, MD  REFRESH OPTIVE PF 0.5-0.9 % SOLN Place 1 drop into both eyes 2 (two) times daily as needed.    [provider]  rosuvastatin (CRESTOR) 5 MG tablet Take 5 mg by mouth daily. 11/19/19   [provider]  valACYclovir (VALTREX) 1000 MG tablet Take 1,000 mg by mouth as needed (breakouts).    [provider]      Allergies    Vancomycin     Review of Systems   Review of Systems  Reason unable to perform ROS: altered mental status.    Physical Exam Updated Vital Signs BP (!) 178/118   Pulse 98   Temp 97.7 F (36.5 C) (Oral)   Resp (!) 26   SpO2 100%  Physical Exam Vitals and nursing note reviewed.  Constitutional:      General: She is not in acute distress.    Appearance: She is well-developed.     Comments: Cachectic, chronically ill-appearing  HENT:     Head: Normocephalic and atraumatic.     Mouth/Throat:     Mouth: Mucous membranes are moist.     Comments: Poor dentition with multiple missing teeth Eyes:     Conjunctiva/sclera: Conjunctivae normal.  Cardiovascular:     Rate and Rhythm: Normal rate and regular rhythm.     Heart sounds: No murmur heard. Pulmonary:     Effort: Pulmonary effort is normal. No respiratory distress.  Abdominal:     Palpations: Abdomen is soft.  Musculoskeletal:        General: No swelling.     Cervical back: Neck supple.  Skin:    General: Skin is warm and dry.     Capillary Refill: Capillary refill takes less than 2 seconds.  Neurological:     Mental Status: She is lethargic and disoriented.     GCS: GCS eye subscore is 4. GCS verbal subscore is 3. GCS motor subscore is 4.     ED Results / Procedures / Treatments   Labs (all labs ordered are listed, but only abnormal results are displayed) Labs Reviewed  COMPREHENSIVE METABOLIC PANEL - Abnormal; Notable for the following components:      Result Value   CO2 19 (*)    Glucose, Bld 163 (*)    Total Bilirubin 1.5 (*)    All other components within normal limits  CBC WITH DIFFERENTIAL/PLATELET - Abnormal; Notable for the following components:   RBC 5.25 (*)    All other components within normal limits  LACTIC ACID, PLASMA - Abnormal; Notable for the following components:   Lactic Acid, Venous 2.9 (*)    All other components within normal limits  URINALYSIS, W/ REFLEX TO CULTURE (INFECTION SUSPECTED) - Abnormal;  Notable for the following components:   Hgb urine dipstick SMALL (*)    Ketones, ur 5 (*)    Protein, ur >=300 (*)    Nitrite POSITIVE (*)    Leukocytes,Ua TRACE (*)    Bacteria, UA RARE (*)    All other components within normal limits  BRAIN NATRIURETIC PEPTIDE - Abnormal; Notable for the following components:   B Natriuretic Peptide 290.3 (*)    All other components within normal limits  CBG MONITORING, ED - Abnormal; Notable for the following components:   Glucose-Capillary 149 (*)    All other components within normal limits  TROPONIN I (HIGH SENSITIVITY) - Abnormal; Notable for the  following components:   Troponin I (High Sensitivity) 34 (*)    All other components within normal limits  TROPONIN I (HIGH SENSITIVITY) - Abnormal; Notable for the following components:   Troponin I (High Sensitivity) 42 (*)    All other components within normal limits  URINE CULTURE  RAPID URINE DRUG SCREEN, HOSP PERFORMED  APTT  PROTIME-INR  ETHANOL  AMMONIA    EKG None  Radiology CT HEAD WO CONTRAST  Result Date: 11/05/2022 CLINICAL DATA:  Mental status change, unknown cause EXAM: CT HEAD WITHOUT CONTRAST TECHNIQUE: Contiguous axial images were obtained from the base of the skull through the vertex without intravenous contrast. RADIATION DOSE REDUCTION: This exam was performed according to the departmental dose-optimization program which includes automated exposure control, adjustment of the mA and/or kV according to patient size and/or use of iterative reconstruction technique. COMPARISON:  06/24/2022 FINDINGS: Brain: No evidence of acute infarction, hemorrhage, hydrocephalus, extra-axial collection or mass lesion/mass effect. Moderate low-density changes within the periventricular and subcortical white matter most compatible with chronic microvascular ischemic change. Mild-moderate diffuse cerebral volume loss. Vascular: Atherosclerotic calcifications involving the large vessels of the skull  base. No unexpected hyperdense vessel. Skull: Normal. Negative for fracture or focal lesion. Sinuses/Orbits: No acute finding. Other: None. IMPRESSION: 1. No acute intracranial findings. 2. Chronic microvascular ischemic change and cerebral volume loss. Electronically Signed   By: Davina Poke D.O.   On: 11/05/2022 13:39   DG Chest Port 1 View  Result Date: 11/05/2022 CLINICAL DATA:  Provided history: Altered mental status. EXAM: PORTABLE CHEST 1 VIEW COMPARISON:  Prior chest radiographs 06/24/2022 and earlier. FINDINGS: Shallow inspiration radiograph. Left chest multi-lead implantable cardiac device. Cardiomegaly. Aortic atherosclerosis. Perihilar opacities bilaterally. Chronic elevation of the right hemidiaphragm. No evidence of pleural effusion or pneumothorax. No acute bony abnormality identified. Degenerative changes of the bilateral glenohumeral and acromioclavicular joints, and of the thoracic spine. Dextrocurvature of the thoracic spine. IMPRESSION: 1. Shallow inspiration radiograph. 2. Cardiomegaly. 3. Bilateral perihilar opacities, which may reflect edema or infection. 4. Chronic elevation of the right hemidiaphragm. 5. Aortic Atherosclerosis (ICD10-I70.0). Electronically Signed   By: Kellie Simmering D.O.   On: 11/05/2022 13:05    Procedures Procedures    Medications Ordered in ED Medications  sodium chloride 0.9 % bolus 1,000 mL (0 mLs Intravenous Stopped 11/05/22 1308)  cefTRIAXone (ROCEPHIN) 1 g in sodium chloride 0.9 % 100 mL IVPB (1 g Intravenous New Bag/Given 11/05/22 1423)    ED Course/ Medical Decision Making/ A&P Clinical Course as of 11/05/22 1532  Fri Nov 05, 2022  1415 Reevaluation shows patient is now conversive and follows commands.  Urinalysis shows signs of UTI with positive nitrites, leukocytes and bacteria.  Will admit for UTI as patient is not safe to go home [AS]  1520 Spoke with hospitalist who will admit [AS]    Clinical Course User Index [AS] Salisha Bardsley, [place] Folk, PA-C                             Medical Decision Making Amount and/or Complexity of Data Reviewed Labs: ordered. Radiology: ordered.  This patient presents to the ED for concern of altered mental status, this involves an extensive number of treatment options, and is a complaint that carries with it a high risk of complications and morbidity. The differential diagnosis for AMS is extensive and includes, but is not limited to: drug overdose - opioids, alcohol, sedatives, antipsychotics, drug withdrawal, others; Metabolic:  hypoxia, hypoglycemia, hyperglycemia, hypercalcemia, hypernatremia, hyponatremia, uremia, hepatic encephalopathy, hypothyroidism, hyperthyroidism, vitamin B12 or thiamine deficiency, carbon monoxide poisoning, Wilson's disease, Lactic acidosis, DKA/HHOS; Infectious: meningitis, encephalitis, bacteremia/sepsis, urinary tract infection, pneumonia, neurosyphilis; Structural: Space-occupying lesion, (brain tumor, subdural hematoma, hydrocephalus,); Vascular: stroke, subarachnoid hemorrhage, coronary ischemia, hypertensive encephalopathy, CNS vasculitis, thrombotic thrombocytopenic purpura, disseminated intravascular coagulation, hyperviscosity; Psychiatric: Schizophrenia, depression; Other: Seizure, hypothermia, heat stroke, ICU psychosis, dementia -"sundowning."   Co morbidities that complicate the patient evaluation  anxiety, hypertension, pulmonary hypertension, arthritis of multiple joints, type 2 diabetes, pacemaker  My initial workup includes ultimately says labs, CT head, fluid resuscitation  Additional history obtained from: Nursing notes from this visit. Family daughter provides a portion of the history EMS provides a portion of the history  I ordered, reviewed and interpreted labs which include: Altered mental status labs.  Urinalysis shows signs of UTI with nitrates, leukocytes and bacteria.  Hyperglycemia 163 with low bicarb of 19 which is a chronic issue for  patient.  Initial troponin elevated to 34  I ordered imaging studies including CT head, chest x-ray I independently visualized and interpreted imaging which showed bilateral perihilar opacities suspicious for edema versus infection.  CT head otherwise unremarkable. I agree with the radiologist interpretation  Cardiac Monitoring:  The patient was maintained on a cardiac monitor.  I personally viewed and interpreted the cardiac monitored which showed an underlying rhythm of: Borderline sinus tachycardia with paced rhythm  Consultations Obtained:  I requested consultation with the hospitalist group,  and discussed lab and imaging findings as well as pertinent plan - they recommend: admission for UTI  Afebrile, hypertensive.  89 year old female presents ED for evaluation of altered mental status initially.  She was a noncontributory historian and would not follow commands initially.  After fluid resuscitation, her mental status improved dramatically.  Her last known normal was greater than 1 week ago.  Daughter states that she is acting similar now to when she has had UTIs in the past.  Her lab work was significant for urinalysis with leukocytes, nitrites and bacteria.  Initial lactate elevated to 2.9.  This was prior to fluid resuscitation.  Patient will be treated for UTI and admitted to hospitalist.  She is unsafe to go home at this time. Hypertensive to a level which daughter states is typical when patient has not taken her blood pressure medications.  Patient's case discussed with Dr. Wyvonnia Dusky who agrees with plan to discharge with follow-up.   Note: Portions of this report may have been transcribed using voice recognition software. Every effort was made to ensure accuracy; however, inadvertent computerized transcription errors may still be present.        Final Clinical Impression(s) / ED Diagnoses Final diagnoses:  Cystitis  Altered mental status, unspecified altered mental status type     Rx / DC Orders ED Discharge Orders     None         Nehemiah Massed 11/05/22 1532    Ezequiel Essex, MD 11/05/22 1742

## 2022-11-05 NOTE — H&P (Signed)
Date: 11/05/2022               Patient Name:  Maria [name] MRN: [id number]  DOB: 1923-11-25 Age / Sex: 89 y.o., female   PCP: Audley Hose, MD         Medical Service: Internal Medicine Teaching Service         Attending Physician: Dr. Aldine Contes    First Contact: Dr. Claudia Desanctis Smiley Birr Pager: [phone]  Second Contact: Dr. Delene Ruffini Pager: [phone]       After Hours (After 5p/  First Contact Pager: [phone]  weekends / holidays): Second Contact Pager: [phone]   Chief Complaint: Altered Mental Status   History of Present Illness:   Maria [name] is a 89 y/o female with past medical medical history of HTN, HLD, T2DM, CVA (10/2019), complete heart block (s/p pacemaker placement 2003 with generator change 04/2019), HFpEF (Echo 09/2016 EF 50-55% with G1DD), breast cancer (2015), OA, GERD, Alzheimer Dementia, and HSV presents for altered mental status.  Daughter was present at bedside and provided all of the history.  Per daughter, patient's mentation has been declining over the last week and significantly over the last 3 days.  Daughter states that the patient has been speaking to individuals that have already died, seeing things that are not there, and not getting out of bed as she normally does.  Daughter reports that she has experienced similar symptoms with prior episodes of UTI.  Daughter states that the patient at baseline typically knows who she is, where she is, is aware of the situation, but is occasionally confused about the year.  Over the last few days she has not been able to recognize the daughter, thought she was in the hospital while at home, but still knows her name.  Daughter states that the patient has dealt with chronic urinary retention in the past.  Patient frequently goes to the bathroom feeling as though she needs to urinate but does not actually urinate.  Daughter states that recently she has been urinating appropriately despite having difficulty getting  to the bathroom.  Daughter states that the patient has been complaining of intermittent dizziness at home which is a chronic issue.  She has been prescribed meclizine for this issue. Daughter also notes that the patient has chronic constipation with last bowel movement occurring 2 days ago.  She receives MiraLAX and stool softener at home. Daughter states that the patient also has chronic back, hip, knee, and feet pain related to osteoarthritis.  She has received injections of her hip previously.  Reports that the patient has not been experiencing fever, chills, sore throat, cough, nausea, vomiting, diarrhea, dysuria, abdominal pain, chest pain, shortness of breath, lower extremity swelling, falls, or syncope.  ED Course:  -CMP significant for elevated bilirubin of 1.5 -CBC unremarkable -Evaded lactic acid to 2.9 -PT/aPTT, ethanol, UDS, ammonia WNL -Troponin elevated at 34 -> 42 -BNP elevated at 290 -UA significant for small hemoglobin, 5 ketones, greater than 300 protein, positive nitrite, trace leukocytes, rare bacteria -Pending urine culture -Pending lactic acid -CXR significant for cardiomegaly, bilateral perihilar opacities possibly reflecting edema versus infection, chronic elevation of the right hemidiaphragm -CT head negative for acute findings, significant for microvascular ischemic change and cerebral volume loss -EKG demonstrating atrial-sensed, ventricular-paced complexes, prolonged QTc -Ceftriaxone 1 g IV -1L NS bolus  Review of Systems: A complete ROS was negative except as per HPI.   Meds:  -Tylenol 650 mg (2 pills in the morning,  2 pills at bedtime) -Meloxicam 7.5 mg daily as needed -Donepezil 10 mg daily -Glimepiride 1 mg BID -Metformin 1000 mg daily -Aspirin 81 mg daily -Rosuvastatin 5 mg daily -Losartan 50 mg daily -Nitroglycerin 0.4 mg tablet -Meclizine 25 mg 3 times daily as needed -Zofran 4 mg every 4 hours as needed -Pantoprazole 20 mg daily -Valacyclovir  1000 mg as needed -Cyclosporine ophthalmic solution -Latanoprost ophthalmic solution -Multivitamin  Allergies: Allergies as of 11/05/2022 - Reviewed 11/05/2022  Allergen Reaction Noted   Vancomycin Itching 08/08/2020   Past Medical History:  Diagnosis Date   Anxiety    Arthritis    Cancer ([place])    breast 2015   Cardiac pacemaker in situ    Chest pain    Dyslipidemia    Dyspnea    with exertion   Family history of adverse reaction to anesthesia    daughter slow to wake up   Gallstones    Headache    Hypertension    Macular degeneration    Positive RPR test 07/08/2022   Pulmonary hypertension (HCC)    PA peak pressure 81 mmHg by 09/2016 echo   Type II or unspecified type diabetes mellitus without mention of complication, not stated as uncontrolled    Wears partial dentures     Family History: father (MI, prostate cancer), mother (uterine cancer).  Denies family history of stroke.   Surgical History: Bilateral knee replacement, pacemaker placement.   Social History: Patient currently lives at home with the help of several individuals.  Home health PT is home from 10 AM to 1 PM daily to help with getting out of bed, going to the bathroom, dressing, and bathing.  Daughter comes around 3 PM to help with meals and getting the patient to the bathroom. She busy groceries. Son comes over at 5 PM and stays until the next morning.  Patient ambulates at baseline with walker but needs help with transfers.  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  Patient has chewed snuff from her teenage years until now.  She has never smoked tobacco.  Patient currently drinks 1 beer every few months but drank heavily throughout her adolescence into her 41s. No current or prior recreational drug use. Has been DNR for the last 5 years.   Physical Exam: Blood pressure (!) 167/74, pulse 60, temperature 97.9 F (36.6 C), temperature source Oral, resp. rate 19, SpO2 92  %.  Constitutional: thin, eyes open staring at the ceiling, intermittently responding to daughter speaking w/ very limited verbal responses, not following most of commands, able to squeeze hand on command, appears confused HENT: Normocephalic and atraumatic. No lymphadenopathy. Dry mucous membranes.  Eyes: EOMI. PERRL.  Neck: Normal range of motion.  Cardiovascular: Regular rate, regular rhythm. No murmurs, rubs, or gallops. Normal radial and PT pulses bilaterally. No LE edema.  Pulmonary: Normal respiratory effort. No wheezes, rales, rhonchi, or crackles.   Abdominal: Soft. Non-distended. No tenderness or guarding. Normal bowel sounds.  Musculoskeletal: Normal range of motion. Moving all extremities spontaneously.     Neurological: Awake but not alert to person, place, or time. Unable to assess strength or sensation due to not following commands.  Skin: warm and dry. Decreased skin turgor. No ulcers of the heels, posterior legs, or hips. Long toenails.   EKG: personally reviewed my interpretation is atrial-sensed, ventricular-paced complexes, prolonged QTc  CXR: personally reviewed my interpretation iscardiomegaly, bilateral perihilar opacities possibly reflecting edema versus infection, chronic elevation of the right  hemidiaphragm  Assessment & Plan by Problem: Principal Problem:   Urinary tract infection  Maria [name] is a 89 y/o female with past medical medical history of HTN, HLD, T2DM, CVA (10/2019), complete heart block (s/p pacemaker placement 2003 with generator change 04/2019), HFpEF (Echo 09/2016 EF 50-55% with G1DD), breast cancer (2015), OA, GERD,Alzheimer Dementia, and HSV that presents for altered mental status, found to have UTI.     #UTI #Altered Mental Status #Alzheimer Dementia Discharged in 05/2022 after being admitted for acute metabolic encephalopathy suspected 2/2 UTI vs pneumonia, treated w/ abx. Presented with worsening confusion and hallucinations. Ethanol level, UDS,  ammonia WNL. Initial lactic acid elevated at 2.9. CT head negative for acute findings, significant for microvascular ischemic change and cerebral volume loss. Low suspicion for stroke at this time. UA concerning for UTI. Daughter reports history of urinary retention. Suspect that meclizine could be contributing to this history. No leukocytosis. Afebrile. Hypertensive. Received Ceftriaxone 1g IV and 1L NS bolus while in the ED. Suspect UTI is likely the cause of her AMS however DDX includes worsening Alzheimer dementia. Taking Donepezil 10 mg daily at home. Will continue treating with IV abx and reassess. Patient failed bedside swallow, currently NPO. Appears dehydrated on exam. Plan for IV fluids once hypertension improves. -IV Ceftriaxone 1g daily  -Pending bladder scan -Pending urine culture -Pending repeat lactic acid -Pending blood cultures -NPO -PT/OT -Delirium precautions   #Hx of Complete Heart Block (s/p pacemaker 2003) #HFpEF (Echo 09/2016 EF 50-55% w/ G1DD) Has history of HFpEF and complete heart block s/p pacemaker. PT/aPTT WNL. Troponin mildly elevated at 34 -> 42 on admission. EKG demonstrating atrial-sensed, ventricular-paced complexes, relatively unchanged from prior. QTC prolonged at 631.  Low suspicion for ACS at this time. BNP elevated at 290. CXR significant for cardiomegaly, bilateral perihilar opacities possibly reflecting edema versus infection. No lower extremity edema or crackles on exam. Satting well on RA. Low suspicion for heart failure exacerbation at this time. Will continue to monitor fluid status. -Mg level given prolonged QTC -Strict Is/Os  #HTN Taking losartan 50 mg daily at home.  Patient did not receive her medications today due to altered mental status and hospitalization.  Hypertensive in the 180s-190s on admission.  Would restart home meds but currently NPO. -IV Labetalol 5 mg q2 hours PRN for Systolic BP > 0000000  [id number] 6.0% 5 months ago. Home medications  include glimepiride 1 mg BID and metformin 1000 mg daily. CBGs in the 100s on admission.  Will hold home medications at this time given NPO.  -Consider starting SSI once patient is tolerating PO  #Hyperbilirubinemia #Hx of Acute Cholecystitis  Patient has history of acute cholecystitis in 2018 involving percutaneous drain placement.  Was noted to have elevated bilirubin at this time.  Patient was deemed to be too high risk for laparoscopic cholecystectomy, therefore this was not pursued.  Was treated with IV antibiotics.  Plan was initially to pursue MRCP, however the patient did not have this procedure done due to pacemaker in place. CMP on admission significant for elevated bilirubin of 1.5, appears stable over the last 5 months.  No complaints of abdominal pain at this time.  Remains afebrile.  Will reassess for abdominal pain as patients mentation improves.  #Hx of CVA (10/2019) #HLD Lipid panel from 3 years ago with total cholesterol 242, triglyceride 105, HDL 33, and LDL 178. Taking Aspirin 81 mg daily and Rosuvastatin 5 mg daily at home. Will hold home medications at this time given  NPO.  - Consider restarting aspirin and rosuvastatin once tolerating PO  #OA Home medications include Tylenol 650 mg (2X BID) and Meloxicam 7.5 mg daily PRN.  Currently NPO at this time. -Reconsider Tylenol PRN as mentation improves  #GERD Taking Pantoprazole 20 mg daily at home. Currently NPO at this time. -Will restart home pantoprazole once tolerating PO  #Hx of breast cancer (2015) Has history of right breast benign lumpectomy.  Mammogram from 10/2013 demonstrates no evidence of malignancy.  #HSV Taking valacyclovir 1000 mg PRN at home. Will hold for now given NPO.   Diet: NPO Bowel: mirilax, senna VTE: lovenox IVF: none Code: DNR   Prior to Admission Living Arrangement: home with son and daughter Anticipated Discharge Location: TBD Barriers to Discharge: continued management  Dispo: Admit  patient to Inpatient with expected length of stay greater than 2 midnights.  Signed: Starlyn Skeans, MD 11/05/2022, 5:48 PM  Pager: [phone] After 5pm on weekdays and 1pm on weekends: On Call pager: [phone]

## 2022-11-05 NOTE — ED Notes (Signed)
ED TO INPATIENT HANDOFF REPORT  ED Nurse Name and Phone #: Josh  S Name/Age/Gender Maria [name] 89 y.o. female Room/Bed: 015C/015C  Code Status   Code Status: DNR  Home/SNF/Other Home Patient oriented to: self Is this baseline?  Sometimes oriented to self, time place.  But currently only oriented to self.  Triage Complete: Triage complete  Chief Complaint Urinary tract infection [N39.0]  Triage Note PT BIB [facility] for left leaning and shoulder pain.  PT LKW 1800 last night when family last checked on her.  PT has some memory impairment but lives alone and is checked on regularly.  EMS did not note any specific deficits except for leaning to left.   EMS states pt is A&O x2-3.  Pt has hx of HTN.  182/96 96% Paced rhythm in the 90's, CBG 203   Allergies Allergies  Allergen Reactions   Vancomycin Itching    Level of Care/Admitting Diagnosis ED Disposition     ED Disposition  Admit   Condition  --   Comment  Hospital Area: [facility] [[id number]]  Level of Care: Med-Surg [16]  May admit patient to [facility] or [facility] if equivalent level of care is available:: Yes  Covid Evaluation: Asymptomatic - no recent exposure (last 10 days) testing not required  Diagnosis: Urinary tract infection [id number]  Admitting Physician: Angelica Pou Johnson  Attending Physician: Angelica Pou [id number]  Certification:: I certify this patient will need inpatient services for at least 2 midnights  Estimated Length of Stay: 3          B Medical/Surgery History Past Medical History:  Diagnosis Date   Anxiety    Arthritis    Cancer ([facility])    breast 2015   Cardiac pacemaker in situ    Chest pain    Dyslipidemia    Dyspnea    with exertion   Family history of adverse reaction to anesthesia    daughter slow to wake up   Gallstones    Headache    Hypertension    Macular degeneration    Positive RPR test 07/08/2022   Pulmonary hypertension ([place])     PA peak pressure 81 mmHg by 09/2016 echo   Type II or unspecified type diabetes mellitus without mention of complication, not stated as uncontrolled    Wears partial dentures    Past Surgical History:  Procedure Laterality Date   BREAST EXCISIONAL BIOPSY Right 2010   CATARACT EXTRACTION     CHOLECYSTECTOMY N/A 01/18/2017   Procedure: LAPAROSCOPIC CHOLECYSTECTOMY;  Surgeon: Ralene Ok, MD;  Location: [facility];  Service: General;  Laterality: N/A;   IR GENERIC HISTORICAL  10/08/2016   IR PERC CHOLECYSTOSTOMY 10/08/2016 Greggory Keen, MD [facility]   IR RADIOLOGIST EVAL & MGMT  11/16/2016   KNEE SURGERY Bilateral    MULTIPLE TOOTH EXTRACTIONS     PACEMAKER INSERTION     [employer] 2003   PACEMAKER INSERTION     PPM GENERATOR CHANGEOUT N/A 04/16/2019   Procedure: PPM GENERATOR CHANGEOUT;  Surgeon: Evans Lance, MD;  Location: [facility];  Service: Cardiovascular;  Laterality: N/A;   TOTAL KNEE ARTHROPLASTY Bilateral      A IV Location/Drains/Wounds Patient Lines/Drains/Airways Status     Active Line/Drains/Airways     Name Placement date Placement time Site Days   Peripheral IV 11/05/22 20 G Left;Posterior Forearm 11/05/22  1131  Forearm  less than 1  Intake/Output Last 24 hours  Intake/Output Summary (Last 24 hours) at 11/05/2022 1820 Last data filed at 11/05/2022 1308 Gross per 24 hour  Intake 1000 ml  Output 650 ml  Net 350 ml    Labs/Imaging Results for orders placed or performed during the hospital encounter of 11/05/22 (from the past 48 hour(s))  Comprehensive metabolic panel     Status: Abnormal   Collection Time: 11/05/22 12:06 PM  Result Value Ref Range   Sodium 135 135 - 145 mmol/L   Potassium 4.0 3.5 - 5.1 mmol/L   Chloride 103 98 - 111 mmol/L   CO2 19 (L) 22 - 32 mmol/L   Glucose, Bld 163 (H) 70 - 99 mg/dL    Comment: Glucose reference range applies only to samples taken after fasting for at least 8 hours.   BUN 16 8  - 23 mg/dL   Creatinine, Ser 0.71 0.44 - 1.00 mg/dL   Calcium 9.4 8.9 - 10.3 mg/dL   Total Protein 8.1 6.5 - 8.1 g/dL   Albumin 4.5 3.5 - 5.0 g/dL   AST 21 15 - 41 U/L   ALT 18 0 - 44 U/L   Alkaline Phosphatase 38 38 - 126 U/L   Total Bilirubin 1.5 (H) 0.3 - 1.2 mg/dL   GFR, Estimated >60 >60 mL/min    Comment: (NOTE) Calculated using the CKD-EPI Creatinine Equation (2021)    Anion gap 13 5 - 15    Comment: Performed at [facility] [address] [place], [place] [address]  CBC with Differential/Platelet     Status: Abnormal   Collection Time: 11/05/22 12:06 PM  Result Value Ref Range   WBC 8.0 4.0 - 10.5 K/uL   RBC 5.25 (H) 3.87 - 5.11 MIL/uL   Hemoglobin 14.7 12.0 - 15.0 g/dL   HCT 45.6 36.0 - 46.0 %   MCV 86.9 80.0 - 100.0 fL   MCH 28.0 26.0 - 34.0 pg   MCHC 32.2 30.0 - 36.0 g/dL   RDW 13.4 11.5 - 15.5 %   Platelets 261 150 - 400 K/uL   nRBC 0.0 0.0 - 0.2 %   Neutrophils Relative % 79 %   Neutro Abs 6.3 1.7 - 7.7 K/uL   Lymphocytes Relative 16 %   Lymphs Abs 1.3 0.7 - 4.0 K/uL   Monocytes Relative 5 %   Monocytes Absolute 0.4 0.1 - 1.0 K/uL   Eosinophils Relative 0 %   Eosinophils Absolute 0.0 0.0 - 0.5 K/uL   Basophils Relative 0 %   Basophils Absolute 0.0 0.0 - 0.1 K/uL   Immature Granulocytes 0 %   Abs Immature Granulocytes 0.03 0.00 - 0.07 K/uL    Comment: Performed at [facility] [address] [place], [place] [address]  Lactic acid, plasma     Status: Abnormal   Collection Time: 11/05/22 12:06 PM  Result Value Ref Range   Lactic Acid, Venous 2.9 (HH) 0.5 - 1.9 mmol/L    Comment: CRITICAL RESULT CALLED TO, READ BACK BY AND VERIFIED WITH K ROWE RN 11/06/2022 1338 BNUNNERY Performed at [facility], [place] [address] [place], [place] [address]   APTT     Status: None   Collection Time: 11/05/22 12:06 PM  Result Value Ref Range   aPTT 35 24 - 36 seconds    Comment: Performed at [facility] [address] [place], [place]  [address]  Protime-INR     Status: None   Collection Time:  11/05/22 12:06 PM  Result Value Ref Range   Prothrombin Time 14.8 11.4 - 15.2 seconds   INR 1.2 0.8 - 1.2    Comment: (NOTE) INR goal varies based on device and disease states. Performed at [facility], [place] [address], [place], [place] [address]   Ethanol     Status: None   Collection Time: 11/05/22 12:06 PM  Result Value Ref Range   Alcohol, Ethyl (B) <10 <10 mg/dL    Comment: (NOTE) Lowest detectable limit for serum alcohol is 10 mg/dL.  For medical purposes only. Performed at [facility], [place] [address] [place], [place] [address]   Troponin I (High Sensitivity)     Status: Abnormal   Collection Time: 11/05/22 12:06 PM  Result Value Ref Range   Troponin I (High Sensitivity) 34 (H) <18 ng/L    Comment: (NOTE) Elevated high sensitivity troponin I (hsTnI) values and significant  changes across serial measurements may suggest ACS but many other  chronic and acute conditions are known to elevate hsTnI results.  Refer to the "Links" section for chest pain algorithms and additional  guidance. Performed at [facility], [place] [address] [place], [place] [address]   Brain natriuretic peptide     Status: Abnormal   Collection Time: 11/05/22 12:06 PM  Result Value Ref Range   B Natriuretic Peptide 290.3 (H) 0.0 - 100.0 pg/mL    Comment: Performed at [facility] [address], [place], [place] [address]  CBG monitoring, ED     Status: Abnormal   Collection Time: 11/05/22 12:18 PM  Result Value Ref Range   Glucose-Capillary 149 (H) 70 - 99 mg/dL    Comment: Glucose reference range applies only to samples taken after fasting for at least 8 hours.  Rapid urine drug screen (hospital performed)     Status: None   Collection Time: 11/05/22 12:45 PM  Result Value Ref Range   Opiates NONE DETECTED NONE DETECTED   Cocaine NONE DETECTED NONE DETECTED   Benzodiazepines NONE DETECTED NONE DETECTED    Amphetamines NONE DETECTED NONE DETECTED   Tetrahydrocannabinol NONE DETECTED NONE DETECTED   Barbiturates NONE DETECTED NONE DETECTED    Comment: (NOTE) DRUG SCREEN FOR MEDICAL PURPOSES ONLY.  IF CONFIRMATION IS NEEDED FOR ANY PURPOSE, NOTIFY LAB WITHIN 5 DAYS.  LOWEST DETECTABLE LIMITS FOR URINE DRUG SCREEN Drug Class                     Cutoff (ng/mL) Amphetamine and metabolites    1000 Barbiturate and metabolites    200 Benzodiazepine                 200 Opiates and metabolites        300 Cocaine and metabolites        300 THC                            50 Performed at [facility], [place] [address], [place], [place] [address]   Urinalysis, w/ Reflex to Culture (Infection Suspected) -Urine, Catheterized     Status: Abnormal   Collection Time: 11/05/22 12:45 PM  Result Value Ref Range   Specimen Source URINE, CATHETERIZED    Color, Urine YELLOW YELLOW   APPearance CLEAR CLEAR   Specific Gravity, Urine 1.015 1.005 - 1.030   pH 5.0 5.0 - 8.0   Glucose, UA NEGATIVE NEGATIVE mg/dL  Hgb urine dipstick SMALL (A) NEGATIVE   Bilirubin Urine NEGATIVE NEGATIVE   Ketones, ur 5 (A) NEGATIVE mg/dL   Protein, ur >=300 (A) NEGATIVE mg/dL   Nitrite POSITIVE (A) NEGATIVE   Leukocytes,Ua TRACE (A) NEGATIVE   RBC / HPF 0-5 0 - 5 RBC/hpf   WBC, UA 11-20 0 - 5 WBC/hpf    Comment:        Reflex urine culture not performed if WBC <=10, OR if Squamous epithelial cells >5. If Squamous epithelial cells >5 suggest recollection.    Bacteria, UA RARE (A) NONE SEEN   Squamous Epithelial / HPF 0-5 0 - 5 /HPF   Mucus PRESENT    Hyaline Casts, UA PRESENT     Comment: Performed at [facility], [place] [address] [place], [place] [address]  Urine Culture     Status: None (Preliminary result)   Collection Time: 11/05/22 12:45 PM   Specimen: Urine, Catheterized  Result Value Ref Range   Specimen Description URINE, CATHETERIZED    Special Requests      NONE Reflexed from  [id number] Performed at [facility], [place] [address] [place], [place] [address]    Culture PENDING    Report Status PENDING   Troponin I (High Sensitivity)     Status: Abnormal   Collection Time: 11/05/22  2:15 PM  Result Value Ref Range   Troponin I (High Sensitivity) 42 (H) <18 ng/L    Comment: (NOTE) Elevated high sensitivity troponin I (hsTnI) values and significant  changes across serial measurements may suggest ACS but many other  chronic and acute conditions are known to elevate hsTnI results.  Refer to the "Links" section for chest pain algorithms and additional  guidance. Performed at [facility], [place] [address] [place], [place] [address]   Ammonia     Status: None   Collection Time: 11/05/22  2:15 PM  Result Value Ref Range   Ammonia 17 9 - 35 umol/L    Comment: Performed at [facility], [place] [address], [place], [place] [address]   CT HEAD WO CONTRAST  Result Date: 11/05/2022 CLINICAL DATA:  Mental status change, unknown cause EXAM: CT HEAD WITHOUT CONTRAST TECHNIQUE: Contiguous axial images were obtained from the base of the skull through the vertex without intravenous contrast. RADIATION DOSE REDUCTION: This exam was performed according to the departmental dose-optimization program which includes automated exposure control, adjustment of the mA and/or kV according to patient size and/or use of iterative reconstruction technique. COMPARISON:  06/24/2022 FINDINGS: Brain: No evidence of acute infarction, hemorrhage, hydrocephalus, extra-axial collection or mass lesion/mass effect. Moderate low-density changes within the periventricular and subcortical white matter most compatible with chronic microvascular ischemic change. Mild-moderate diffuse cerebral volume loss. Vascular: Atherosclerotic calcifications involving the large vessels of the skull base. No unexpected hyperdense vessel. Skull: Normal. Negative for fracture or focal lesion. Sinuses/Orbits: No acute  finding. Other: None. IMPRESSION: 1. No acute intracranial findings. 2. Chronic microvascular ischemic change and cerebral volume loss. Electronically Signed   By: Davina Poke D.O.   On: 11/05/2022 13:39   DG Chest Port 1 View  Result Date: 11/05/2022 CLINICAL DATA:  Provided history: Altered mental status. EXAM: PORTABLE CHEST 1 VIEW COMPARISON:  Prior chest radiographs 06/24/2022 and earlier. FINDINGS: Shallow inspiration radiograph. Left chest multi-lead implantable cardiac device. Cardiomegaly. Aortic atherosclerosis. Perihilar opacities bilaterally. Chronic elevation of the right hemidiaphragm. No evidence of pleural effusion or pneumothorax. No acute bony abnormality identified. Degenerative changes  of the bilateral glenohumeral and acromioclavicular joints, and of the thoracic spine. Dextrocurvature of the thoracic spine. IMPRESSION: 1. Shallow inspiration radiograph. 2. Cardiomegaly. 3. Bilateral perihilar opacities, which may reflect edema or infection. 4. Chronic elevation of the right hemidiaphragm. 5. Aortic Atherosclerosis (ICD10-I70.0). Electronically Signed   By: Kellie Simmering D.O.   On: 11/05/2022 13:05    Pending Labs Unresulted Labs (From admission, onward)     Start     Ordered   11/06/22 0500  Comprehensive metabolic panel  Tomorrow morning,   R        11/05/22 1733   11/06/22 0500  CBC  Tomorrow morning,   R        11/05/22 1733   11/05/22 1732  Lactic acid, plasma  STAT Now then every 3 hours,   R      11/05/22 1731            Vitals/Pain Today's Vitals   11/05/22 1645 11/05/22 1700 11/05/22 1705 11/05/22 1715  BP: (!) 187/150 (!) 159/131  (!) 167/74  Pulse: 61 (!) 55  60  Resp: (!) '27 16  19  '$ Temp:   97.9 F (36.6 C)   TempSrc:   Oral   SpO2: 95% 96%  92%  PainSc:        Isolation Precautions No active isolations  Medications Medications  enoxaparin (LOVENOX) injection 40 mg (has no administration in time range)  sodium chloride 0.9 % bolus 1,000  mL (0 mLs Intravenous Stopped 11/05/22 1308)  cefTRIAXone (ROCEPHIN) 1 g in sodium chloride 0.9 % 100 mL IVPB (0 g Intravenous Stopped 11/05/22 1548)    Mobility walks with device     Focused Assessments Neuro Assessment Handoff:  Swallow screen pass? I had to fail her. She is a little lethargic but I tried to give her the benefit of the doubt and engage her enough to do the test. With a little coaching she closed her lips around the straw and took some sips but she did cough.  Maybe you could try again later.  Maybe after all of the jostling and futzing that happens during an admit she would be more with it to succeed.          Neuro Assessment:   Neuro Checks:      Has TPA been given? No If patient is a Neuro Trauma and patient is going to OR before floor call report to [facility] nurse: [phone] or [phone]   R Recommendations: See Admitting Provider Note  Report given to:   Additional Notes:

## 2022-11-05 NOTE — Hospital Course (Addendum)
Maria [name] is a 89 y/o female with past medical medical history of HTN, HLD, T2DM, CVA (10/2019), complete heart block (s/p pacemaker placement 2003 with generator change 04/2019), HFpEF (Echo 09/2016 EF 50-55% with G1DD), breast cancer (2015), OA, GERD,Alzheimer Dementia, and HSV that presents for altered mental status, found to have UTI.       #UTI #Altered Mental Status #Alzheimer Dementia Discharged in 05/2022 after being admitted for acute metabolic encephalopathy suspected 2/2 UTI vs pneumonia, treated w/ abx. Presented with worsening confusion and hallucinations. Ethanol level, UDS, ammonia WNL. Initial lactic acid elevated at 2.9. CT head negative for acute findings, significant for microvascular ischemic change and cerebral volume loss. Low suspicion for stroke at this time. UA concerning for UTI. Daughter reports history of urinary retention. Suspect that meclizine could be contributing to this history. No leukocytosis. Afebrile. Hypertensive. Received Ceftriaxone 1g IV and 1L NS bolus while in the ED. Suspect UTI is likely the cause of her AMS however DDX includes worsening Alzheimer dementia. Taking Donepezil 10 mg daily at home. Will continue treating with IV abx and reassess. Patient failed bedside swallow, currently NPO. Appears dehydrated on exam. Plan for IV fluids once hypertension improves. -IV Ceftriaxone 1g daily  -Pending bladder scan -Pending urine culture -Pending repeat lactic acid -Pending blood cultures -NPO -PT/OT -Delirium precautions   UA concerning for UTI on admission. CXR w/ edema vs infiltrate on admission. Urine culture positive for Pseudomonas. Transitioned form ceftriaxone to cefepime. Doxycyline was started for atypical coverage due to concern for possible pneumonia. Repeat CXR on 2/25 demonstrates improving bilateral perihilar/left basilar opacities. No crackles on exam. Continues to be satting well on RA. Remains afebrile. Lower suspicion for pneumonia at this  time. Will discontinue doxycycline after today for this reason. Will continue treating UTI w/ cefepime.  - Continue IV cefepime 2g - Last day of IV Doxycycline 100 mg q12 hours -Delirium precautions - Dysphagia 1 diet   #Hx of Complete Heart Block (s/p pacemaker 2003) #HFpEF (Echo 09/2016 EF 50-55% w/ G1DD) Has history of HFpEF and complete heart block s/p pacemaker. PT/aPTT WNL. Troponin mildly elevated at 34 -> 42 on admission. EKG demonstrating atrial-sensed, ventricular-paced complexes, relatively unchanged from prior. QTC prolonged at 631.  Low suspicion for ACS at this time. BNP elevated at 290. CXR significant for cardiomegaly, bilateral perihilar opacities possibly reflecting edema versus infection. No lower extremity edema or crackles on exam. Satting well on RA. Low suspicion for heart failure exacerbation at this time. Will continue to monitor fluid status. -Mg level given prolonged QTC -Strict Is/Os   #HTN Taking losartan 50 mg daily at home.  Patient did not receive her medications today due to altered mental status and hospitalization.  Hypertensive in the 180s-190s on admission.  Would restart home meds but currently NPO. -IV Labetalol 5 mg q2 hours PRN for Systolic BP > 0000000   [id number] A1c 6.0% 5 months ago. Home medications include glimepiride 1 mg BID and metformin 1000 mg daily. CBGs in the 100s on admission.  Will hold home medications at this time given NPO.  -Consider starting SSI once patient is tolerating PO   #Hyperbilirubinemia #Hx of Acute Cholecystitis  Patient has history of acute cholecystitis in 2018 involving percutaneous drain placement.  Was noted to have elevated bilirubin at this time.  Patient was deemed to be too high risk for laparoscopic cholecystectomy, therefore this was not pursued.  Was treated with IV antibiotics.  Plan was initially to pursue MRCP, however the patient  did not have this procedure done due to pacemaker in place. CMP on admission  significant for elevated bilirubin of 1.5, appears stable over the last 5 months.  No complaints of abdominal pain at this time.  Remains afebrile.  Will reassess for abdominal pain as patients mentation improves. -Trend CMP   #Hx of CVA (10/2019) #HLD Lipid panel from 3 years ago with total cholesterol 242, triglyceride 105, HDL 33, and LDL 178. Taking Aspirin 81 mg daily and Rosuvastatin 5 mg daily at home. Will hold home medications at this time given NPO.  - Consider restarting aspirin and rosuvastatin once tolerating PO   #OA Home medications include Tylenol 650 mg (2X BID) and Meloxicam 7.5 mg daily PRN.  Currently NPO at this time. -Reconsider Tylenol PRN as mentation improves   #GERD Taking Pantoprazole 20 mg daily at home. Currently NPO at this time. -Will restart home pantoprazole once tolerating PO   #Hx of breast cancer (2015) Has history of right breast benign lumpectomy.  Mammogram from 10/2013 demonstrates no evidence of malignancy.   #HSV Taking valacyclovir 1000 mg PRN at home. Will hold for now given NPO.  ------------------------------------------ 2/24:

## 2022-11-05 NOTE — ED Triage Notes (Signed)
PT BIB [facility] for left leaning and shoulder pain.  PT LKW 1800 last night when family last checked on her.  PT has some memory impairment but lives alone and is checked on regularly.  EMS did not note any specific deficits except for leaning to left.   EMS states pt is A&O x2-3.  Pt has hx of HTN.  182/96 96% Paced rhythm in the 90's, CBG 203

## 2022-11-06 ENCOUNTER — Other Ambulatory Visit: Payer: Self-pay

## 2022-11-06 ENCOUNTER — Inpatient Hospital Stay (HOSPITAL_COMMUNITY): Payer: Medicare Other

## 2022-11-06 DIAGNOSIS — G309 Alzheimer's disease, unspecified: Secondary | ICD-10-CM | POA: Diagnosis not present

## 2022-11-06 DIAGNOSIS — N390 Urinary tract infection, site not specified: Secondary | ICD-10-CM | POA: Diagnosis not present

## 2022-11-06 DIAGNOSIS — R4182 Altered mental status, unspecified: Secondary | ICD-10-CM | POA: Diagnosis not present

## 2022-11-06 LAB — COMPREHENSIVE METABOLIC PANEL WITH GFR
ALT: 16 U/L (ref 0–44)
AST: 26 U/L (ref 15–41)
Albumin: 4 g/dL (ref 3.5–5.0)
Alkaline Phosphatase: 37 U/L — ABNORMAL LOW (ref 38–126)
Anion gap: 15 (ref 5–15)
BUN: 12 mg/dL (ref 8–23)
CO2: 20 mmol/L — ABNORMAL LOW (ref 22–32)
Calcium: 9 mg/dL (ref 8.9–10.3)
Chloride: 100 mmol/L (ref 98–111)
Creatinine, Ser: 0.68 mg/dL (ref 0.44–1.00)
GFR, Estimated: >60 mL/min
Glucose, Bld: 148 mg/dL — ABNORMAL HIGH (ref 70–99)
Potassium: 3.6 mmol/L (ref 3.5–5.1)
Sodium: 135 mmol/L (ref 135–145)
Total Bilirubin: 1.9 mg/dL — ABNORMAL HIGH (ref 0.3–1.2)
Total Protein: 7.5 g/dL (ref 6.5–8.1)

## 2022-11-06 LAB — LACTIC ACID, PLASMA
Lactic Acid, Venous: 2.1 mmol/L (ref 0.5–1.9)
Lactic Acid, Venous: 2.1 mmol/L (ref 0.5–1.9)
Lactic Acid, Venous: 2.2 mmol/L (ref 0.5–1.9)
Lactic Acid, Venous: 1.8 mmol/L (ref 0.5–1.9)

## 2022-11-06 LAB — BILIRUBIN, FRACTIONATED(TOT/DIR/INDIR)
Bilirubin, Direct: 0.3 mg/dL — ABNORMAL HIGH (ref 0.0–0.2)
Indirect Bilirubin: 1.4 mg/dL — ABNORMAL HIGH (ref 0.3–0.9)
Total Bilirubin: 1.7 mg/dL — ABNORMAL HIGH (ref 0.3–1.2)

## 2022-11-06 LAB — CBC
HCT: 42.2 % (ref 36.0–46.0)
Hemoglobin: 14.1 g/dL (ref 12.0–15.0)
MCH: 28.1 pg (ref 26.0–34.0)
MCHC: 33.4 g/dL (ref 30.0–36.0)
MCV: 84.1 fL (ref 80.0–100.0)
Platelets: 257 10*3/uL (ref 150–400)
RBC: 5.02 MIL/uL (ref 3.87–5.11)
RDW: 13.5 % (ref 11.5–15.5)
WBC: 7.5 10*3/uL (ref 4.0–10.5)
nRBC: 0 % (ref 0.0–0.2)

## 2022-11-06 LAB — MAGNESIUM: Magnesium: 2 mg/dL (ref 1.7–2.4)

## 2022-11-06 MED ORDER — LACTATED RINGERS IV BOLUS
250.0000 mL | Freq: Once | INTRAVENOUS | Status: AC
  Administered 2022-11-06: 250 mL via INTRAVENOUS

## 2022-11-06 MED ORDER — SODIUM CHLORIDE 0.9 % IV SOLN
100.0000 mg | Freq: Two times a day (BID) | INTRAVENOUS | Status: DC
  Administered 2022-11-06 – 2022-11-07 (×4): 100 mg via INTRAVENOUS
  Filled 2022-11-06 (×5): qty 100

## 2022-11-06 NOTE — Progress Notes (Signed)
Subjective:   Patient remains confused today but somewhat improved from yesterday.  Able to tell me her name.  She did not know that she was in the hospital or why she was here.  She was unable to tell me the year.  Appears comfortable overall.  Objective:  Vital signs in last 24 hours: Vitals:   11/06/22 0430 11/06/22 0445 11/06/22 0500 11/06/22 0614  BP: (!) 131/109 (!) 148/111  (!) 160/107  Pulse: 80 83 84 91  Resp:   19 17  Temp:    98 F (36.7 C)  TempSrc:      SpO2: 92% 96% 93% 98%   Physical Exam: Constitutional: thin, eyes open staring at the ceiling, speaking w/ limited verbal responses, following some commands, appears confused HENT: Dry mucous membranes.  Eyes: EOMI. PERRL.  Cardiovascular: Regular rate, regular rhythm. No murmurs, rubs, or gallops. Normal radial and PT pulses bilaterally. No LE edema.  Pulmonary: Normal respiratory effort. No wheezes. Minimal crackles in bilateral lung bases.  Abdominal: Soft. Non-distended. Some wincing when palpating the RUQ. Normal bowel sounds.  Musculoskeletal: Moving all extremities spontaneously.     Neurological: Awake, alert to person but not to place, situation, or time. Unable to assess strength or sensation due to not following commands.  Skin: warm and dry. Decreased skin turgor. No ulcers of the feet.   Assessment/Plan:  Principal Problem:   Urinary tract infection   Maria [name] is a 89 y/o female with past medical medical history of HTN, HLD, T2DM, CVA (10/2019), complete heart block (s/p pacemaker placement 2003 with generator change 04/2019), HFpEF (Echo 09/2016 EF 50-55% with G1DD), breast cancer (2015), OA, GERD,Alzheimer Dementia, and HSV that presents for altered mental status, found to have UTI.       #UTI #Altered Mental Status #Alzheimer Dementia Presented w/ worsening confusion and hallucinations. Prior hospitalizations with similar presentation. Ethanol, UDS, ammonia WNL. No leukocytosis. BC remain  negative. CT head w/ chronic microvascular ischemic changes/cerebral volume loss. UA concerning for UTI. Not retaining urine on bladder scan. Lactic acid trending downward with IV fluids overnight (750 mL), 2.1 today. Mental status mildly improved today. To remain NPO w/ gental IV fluids (given HFpEF) for hydration and elevated lactic acid as BP tolerates. Will continue treating with IV abx today. Also has some crackles in bilateral lung bases. CXR w/ bilateral perihilar opacitis (edema vs infection). However, afebrile, satting well on RA, and no leukocytosis. Will add doxycycline today given possibility of pneumonia (would have added azithromycin but QTc prolonged). Will reassess bedside swallow today.  -Continue IV Ceftriaxone 1g daily (day 2) -Start IV Doxycycline 100 mg q12 hours (day 1) -Pending urine culture -Trend lactic acid -NPO -PT/OT -Delirium precautions    #Hx of Complete Heart Block (s/p pacemaker 2003) #HFpEF (Echo 09/2016 EF 50-55% w/ G1DD) Hx of HFpEF and complete heart block s/p pacemaker. Trops flat. Repeat EKG overnight w/ atrial-sensed, ventricular-paced complexes, Qtc improved to 514. Mg low at 1.4 overnight. Repleted overnight. BNP elevated at 290. CXR significant for cardiomegaly, bilateral perihilar opacities possibly reflecting edema versus infection. Remains satting well on RA. No LE edema. Some crackles in bilateral lung bases. Will be gentle with giving fluids given that CXR/lung exam findings could represent excess fluid accumulation.  -Trend Mg level -Strict Is/Os   #HTN On losartan 50 mg daily at home. Remains hypertensive this morning w/ systolic BP b/t [id number]. Plan to restart home meds once tolerating PO.  -IV Labetalol 5 mg q2 hours PRN  for Systolic BP > 0000000   [id number] A1c 6.0% 5 months ago. Taking glimepiride 1 mg BID and metformin 1000 mg daily at [facility] me. CBGs remain in the low 100s.  -Consider starting SSI once patient is tolerating PO    #Hyperbilirubinemia #Hx of Acute Cholecystitis  Hx of acute cholecystitis in 2018 involving percutaneous drain placement. Had elevated bilirubin at this time. Was too high risk for laparoscopic cholecystectomy, was treated w/ IV abx. No MRCP was done due to having pacemaker. Total bili worsened to 1.9 today. Remains afebrile. Has some wincing with palpation of the RUQ today. Given her AMS and hx of cholecystitis, will obtain RUQ [place] to r/o cholelithiasis at this time.  -Trend CMP -Pending RUQ [place]   #Hx of CVA (10/2019) #HLD Taking Aspirin 81 mg daily and Rosuvastatin 5 mg daily at home. Holding home medications while NPO.  - Consider restarting aspirin and rosuvastatin once tolerating PO   #OA Takes Tylenol 650 mg (2X BID) and Meloxicam 7.5 mg daily PRN at home. Remains NPO -Consider restarting Tylenol PRN as mentation improves   #GERD On Pantoprazole 20 mg daily at home. Currently NPO at this time. -Consider restarting pantoprazole once tolerating PO   #Hx of breast cancer (2015) Hx of right breast benign lumpectomy. Mammogram from 10/2013 demonstrates no evidence of malignancy.   #HSV Taking valacyclovir 1000 mg PRN at home. Holding for now given NPO.   #Dispo Patient with recurrent hospitalizations for AMS. Has help from son, daughter, and [facility] at home but appears to need additional help. Suspect that her and her family would benefit from additional help at home. Will discuss with family regarding options.  -TOC consult   Diet: NPO Bowel: mirilax, senna VTE: lovenox IVF: none Code: DNR     Prior to Admission Living Arrangement: home with son and daughter Anticipated Discharge Location: TBD Barriers to Discharge: continued management Dispo: Anticipated discharge in approximately more than 2 day(s).   Starlyn Skeans, MD 11/06/2022, 6:48 AM Pager: [phone] After 5pm on weekdays and 1pm on weekends: On Call pager [phone]

## 2022-11-06 NOTE — ED Notes (Signed)
ED TO INPATIENT HANDOFF REPORT  ED Nurse Name and Phone #: [phone]  S Name/Age/Gender Maria [name] 89 y.o. female Room/Bed: 001C/001C  Code Status   Code Status: DNR  Home/SNF/Other Home Patient oriented to: self, place, and situation Is this baseline? Yes   Triage Complete: Triage complete  Chief Complaint Urinary tract infection [N39.0]  Triage Note PT BIB [facility] for left leaning and shoulder pain.  PT LKW 1800 last night when family last checked on her.  PT has some memory impairment but lives alone and is checked on regularly.  EMS did not note any specific deficits except for leaning to left.   EMS states pt is A&O x2-3.  Pt has hx of HTN.  182/96 96% Paced rhythm in the 90's, CBG 203   Allergies Allergies  Allergen Reactions   Vancomycin Itching    Level of Care/Admitting Diagnosis ED Disposition     ED Disposition  Admit   Condition  --   Comment  Hospital Area: [facility] [[id number]]  Level of Care: Med-Surg [16]  May admit patient to [facility] or [facility] if equivalent level of care is available:: Yes  Covid Evaluation: Asymptomatic - no recent exposure (last 10 days) testing not required  Diagnosis: Urinary tract infection [id number]  Admitting Physician: Angelica Pou [place]  Attending Physician: Angelica Pou [id number]  Certification:: I certify this patient will need inpatient services for at least 2 midnights  Estimated Length of Stay: 3          B Medical/Surgery History Past Medical History:  Diagnosis Date   Anxiety    Arthritis    Cancer ([place])    breast 2015   Cardiac pacemaker in situ    Chest pain    Dyslipidemia    Dyspnea    with exertion   Family history of adverse reaction to anesthesia    daughter slow to wake up   Gallstones    Headache    Hypertension    Macular degeneration    Positive RPR test 07/08/2022   Pulmonary hypertension ([place])    PA peak pressure 81 mmHg by 09/2016 echo   Type  II or unspecified type diabetes mellitus without mention of complication, not stated as uncontrolled    Wears partial dentures    Past Surgical History:  Procedure Laterality Date   BREAST EXCISIONAL BIOPSY Right 2010   CATARACT EXTRACTION     CHOLECYSTECTOMY N/A 01/18/2017   Procedure: LAPAROSCOPIC CHOLECYSTECTOMY;  Surgeon: Ralene Ok, MD;  Location: [facility];  Service: General;  Laterality: N/A;   IR GENERIC HISTORICAL  10/08/2016   IR PERC CHOLECYSTOSTOMY 10/08/2016 Greggory Keen, MD [facility]   IR RADIOLOGIST EVAL & MGMT  11/16/2016   KNEE SURGERY Bilateral    MULTIPLE TOOTH EXTRACTIONS     PACEMAKER INSERTION     [employer], [facility] 2003   PACEMAKER INSERTION     PPM GENERATOR CHANGEOUT N/A 04/16/2019   Procedure: PPM GENERATOR CHANGEOUT;  Surgeon: Evans Lance, MD;  Location: [facility];  Service: Cardiovascular;  Laterality: N/A;   TOTAL KNEE ARTHROPLASTY Bilateral      A IV Location/Drains/Wounds Patient Lines/Drains/Airways Status     Active Line/Drains/Airways     Name Placement date Placement time Site Days   Peripheral IV 11/05/22 20 G Left;Posterior Forearm 11/05/22  1131  Forearm  1            Intake/Output Last 24 hours  Intake/Output  Summary (Last 24 hours) at 11/06/2022 0438 Last data filed at 11/06/2022 0151 Gross per 24 hour  Intake 1500.35 ml  Output 2050 ml  Net -549.65 ml    Labs/Imaging Results for orders placed or performed during the hospital encounter of 11/05/22 (from the past 48 hour(s))  Comprehensive metabolic panel     Status: Abnormal   Collection Time: 11/05/22 12:06 PM  Result Value Ref Range   Sodium 135 135 - 145 mmol/L   Potassium 4.0 3.5 - 5.1 mmol/L   Chloride 103 98 - 111 mmol/L   CO2 19 (L) 22 - 32 mmol/L   Glucose, Bld 163 (H) 70 - 99 mg/dL    Comment: Glucose reference range applies only to samples taken after fasting for at least 8 hours.   BUN 16 8 - 23 mg/dL   Creatinine, Ser 0.71 0.44 - 1.00 mg/dL    Calcium 9.4 8.9 - 10.3 mg/dL   Total Protein 8.1 6.5 - 8.1 g/dL   Albumin 4.5 3.5 - 5.0 g/dL   AST 21 15 - 41 U/L   ALT 18 0 - 44 U/L   Alkaline Phosphatase 38 38 - 126 U/L   Total Bilirubin 1.5 (H) 0.3 - 1.2 mg/dL   GFR, Estimated >60 >60 mL/min    Comment: (NOTE) Calculated using the CKD-EPI Creatinine Equation (2021)    Anion gap 13 5 - 15    Comment: Performed at [facility] [address] [place], [place] [address]  CBC with Differential/Platelet     Status: Abnormal   Collection Time: 11/05/22 12:06 PM  Result Value Ref Range   WBC 8.0 4.0 - 10.5 K/uL   RBC 5.25 (H) 3.87 - 5.11 MIL/uL   Hemoglobin 14.7 12.0 - 15.0 g/dL   HCT 45.6 36.0 - 46.0 %   MCV 86.9 80.0 - 100.0 fL   MCH 28.0 26.0 - 34.0 pg   MCHC 32.2 30.0 - 36.0 g/dL   RDW 13.4 11.5 - 15.5 %   Platelets 261 150 - 400 K/uL   nRBC 0.0 0.0 - 0.2 %   Neutrophils Relative % 79 %   Neutro Abs 6.3 1.7 - 7.7 K/uL   Lymphocytes Relative 16 %   Lymphs Abs 1.3 0.7 - 4.0 K/uL   Monocytes Relative 5 %   Monocytes Absolute 0.4 0.1 - 1.0 K/uL   Eosinophils Relative 0 %   Eosinophils Absolute 0.0 0.0 - 0.5 K/uL   Basophils Relative 0 %   Basophils Absolute 0.0 0.0 - 0.1 K/uL   Immature Granulocytes 0 %   Abs Immature Granulocytes 0.03 0.00 - 0.07 K/uL    Comment: Performed at [facility] [address] [place], [place] [address]  Lactic acid, plasma     Status: Abnormal   Collection Time: 11/05/22 12:06 PM  Result Value Ref Range   Lactic Acid, Venous 2.9 (HH) 0.5 - 1.9 mmol/L    Comment: CRITICAL RESULT CALLED TO, READ BACK BY AND VERIFIED WITH K ROWE RN 11/06/2022 1338 BNUNNERY Performed at [facility], [place] [address], [place], [place] [address]   APTT     Status: None   Collection Time: 11/05/22 12:06 PM  Result Value Ref Range   aPTT 35 24 - 36 seconds    Comment: Performed at [facility] [address] [place], [place] [address]  Protime-INR     Status: None   Collection Time:  11/05/22 12:06 PM  Result Value  Ref Range   Prothrombin Time 14.8 11.4 - 15.2 seconds   INR 1.2 0.8 - 1.2    Comment: (NOTE) INR goal varies based on device and disease states. Performed at [facility], [place] [address] [place], [place] [address]   Ethanol     Status: None   Collection Time: 11/05/22 12:06 PM  Result Value Ref Range   Alcohol, Ethyl (B) <10 <10 mg/dL    Comment: (NOTE) Lowest detectable limit for serum alcohol is 10 mg/dL.  For medical purposes only. Performed at [facility], [place] [address] [place], [place] [address]   Troponin I (High Sensitivity)     Status: Abnormal   Collection Time: 11/05/22 12:06 PM  Result Value Ref Range   Troponin I (High Sensitivity) 34 (H) <18 ng/L    Comment: (NOTE) Elevated high sensitivity troponin I (hsTnI) values and significant  changes across serial measurements may suggest ACS but many other  chronic and acute conditions are known to elevate hsTnI results.  Refer to the "Links" section for chest pain algorithms and additional  guidance. Performed at [facility], [place] [address], [place], [place] [address]   Brain natriuretic peptide     Status: Abnormal   Collection Time: 11/05/22 12:06 PM  Result Value Ref Range   B Natriuretic Peptide 290.3 (H) 0.0 - 100.0 pg/mL    Comment: Performed at [facility] [address], [place], [place] [address]  CBG monitoring, ED     Status: Abnormal   Collection Time: 11/05/22 12:18 PM  Result Value Ref Range   Glucose-Capillary 149 (H) 70 - 99 mg/dL    Comment: Glucose reference range applies only to samples taken after fasting for at least 8 hours.  Rapid urine drug screen (hospital performed)     Status: None   Collection Time: 11/05/22 12:45 PM  Result Value Ref Range   Opiates NONE DETECTED NONE DETECTED   Cocaine NONE DETECTED NONE DETECTED   Benzodiazepines NONE DETECTED NONE DETECTED   Amphetamines NONE DETECTED NONE DETECTED    Tetrahydrocannabinol NONE DETECTED NONE DETECTED   Barbiturates NONE DETECTED NONE DETECTED    Comment: (NOTE) DRUG SCREEN FOR MEDICAL PURPOSES ONLY.  IF CONFIRMATION IS NEEDED FOR ANY PURPOSE, NOTIFY LAB WITHIN 5 DAYS.  LOWEST DETECTABLE LIMITS FOR URINE DRUG SCREEN Drug Class                     Cutoff (ng/mL) Amphetamine and metabolites    1000 Barbiturate and metabolites    200 Benzodiazepine                 200 Opiates and metabolites        300 Cocaine and metabolites        300 THC                            50 Performed at [facility], [place] [address], [place], [place] [address]   Urinalysis, w/ Reflex to Culture (Infection Suspected) -Urine, Catheterized     Status: Abnormal   Collection Time: 11/05/22 12:45 PM  Result Value Ref Range   Specimen Source URINE, CATHETERIZED    Color, Urine YELLOW YELLOW   APPearance CLEAR CLEAR   Specific Gravity, Urine 1.015 1.005 - 1.030   pH 5.0 5.0 - 8.0   Glucose, UA NEGATIVE NEGATIVE mg/dL   Hgb urine dipstick SMALL (  A) NEGATIVE   Bilirubin Urine NEGATIVE NEGATIVE   Ketones, ur 5 (A) NEGATIVE mg/dL   Protein, ur >=300 (A) NEGATIVE mg/dL   Nitrite POSITIVE (A) NEGATIVE   Leukocytes,Ua TRACE (A) NEGATIVE   RBC / HPF 0-5 0 - 5 RBC/hpf   WBC, UA 11-20 0 - 5 WBC/hpf    Comment:        Reflex urine culture not performed if WBC <=10, OR if Squamous epithelial cells >5. If Squamous epithelial cells >5 suggest recollection.    Bacteria, UA RARE (A) NONE SEEN   Squamous Epithelial / HPF 0-5 0 - 5 /HPF   Mucus PRESENT    Hyaline Casts, UA PRESENT     Comment: Performed at [facility], [place] [address] [place], [place] [address]  Urine Culture     Status: None (Preliminary result)   Collection Time: 11/05/22 12:45 PM   Specimen: Urine, Catheterized  Result Value Ref Range   Specimen Description URINE, CATHETERIZED    Special Requests      NONE Reflexed from [id number] Performed at [facility], [place] [address] [place], [place] [address]    Culture PENDING    Report Status PENDING   Troponin I (High Sensitivity)     Status: Abnormal   Collection Time: 11/05/22  2:15 PM  Result Value Ref Range   Troponin I (High Sensitivity) 42 (H) <18 ng/L    Comment: (NOTE) Elevated high sensitivity troponin I (hsTnI) values and significant  changes across serial measurements may suggest ACS but many other  chronic and acute conditions are known to elevate hsTnI results.  Refer to the "Links" section for chest pain algorithms and additional  guidance. Performed at [facility], [place] [address] [place], [place] [address]   Ammonia     Status: None   Collection Time: 11/05/22  2:15 PM  Result Value Ref Range   Ammonia 17 9 - 35 umol/L    Comment: Performed at [facility], [place] [address], [place], [place] [address]  Magnesium     Status: Abnormal   Collection Time: 11/05/22  8:24 PM  Result Value Ref Range   Magnesium 1.4 (L) 1.7 - 2.4 mg/dL    Comment: Performed at [facility] [address], [place], [place] [address]  Comprehensive metabolic panel     Status: Abnormal   Collection Time: 11/06/22 12:42 AM  Result Value Ref Range   Sodium 135 135 - 145 mmol/L   Potassium 3.6 3.5 - 5.1 mmol/L   Chloride 100 98 - 111 mmol/L   CO2 20 (L) 22 - 32 mmol/L   Glucose, Bld 148 (H) 70 - 99 mg/dL    Comment: Glucose reference range applies only to samples taken after fasting for at least 8 hours.   BUN 12 8 - 23 mg/dL   Creatinine, Ser 0.68 0.44 - 1.00 mg/dL   Calcium 9.0 8.9 - 10.3 mg/dL   Total Protein 7.5 6.5 - 8.1 g/dL   Albumin 4.0 3.5 - 5.0 g/dL   AST 26 15 - 41 U/L   ALT 16 0 - 44 U/L   Alkaline Phosphatase 37 (L) 38 - 126 U/L   Total Bilirubin 1.9 (H) 0.3 - 1.2 mg/dL   GFR, Estimated >60 >60 mL/min    Comment: (NOTE) Calculated using the CKD-EPI Creatinine Equation (2021)    Anion gap 15 5 - 15    Comment: Performed at [facility]  Lab, [address], [place]  [address]  CBC     Status: None   Collection Time: 11/06/22 12:42 AM  Result Value Ref Range   WBC 7.5 4.0 - 10.5 K/uL   RBC 5.02 3.87 - 5.11 MIL/uL   Hemoglobin 14.1 12.0 - 15.0 g/dL   HCT 42.2 36.0 - 46.0 %   MCV 84.1 80.0 - 100.0 fL   MCH 28.1 26.0 - 34.0 pg   MCHC 33.4 30.0 - 36.0 g/dL   RDW 13.5 11.5 - 15.5 %   Platelets 257 150 - 400 K/uL   nRBC 0.0 0.0 - 0.2 %    Comment: Performed at [facility], [place] [address], [place], [place] [address]  Lactic acid, plasma     Status: Abnormal   Collection Time: 11/06/22 12:42 AM  Result Value Ref Range   Lactic Acid, Venous 2.1 (HH) 0.5 - 1.9 mmol/L    Comment: CRITICAL VALUE NOTED. VALUE IS CONSISTENT WITH PREVIOUSLY REPORTED/CALLED VALUE Performed at [facility], [place] [address], [place], [place] [address]    CT HEAD WO CONTRAST  Result Date: 11/05/2022 CLINICAL DATA:  Mental status change, unknown cause EXAM: CT HEAD WITHOUT CONTRAST TECHNIQUE: Contiguous axial images were obtained from the base of the skull through the vertex without intravenous contrast. RADIATION DOSE REDUCTION: This exam was performed according to the departmental dose-optimization program which includes automated exposure control, adjustment of the mA and/or kV according to patient size and/or use of iterative reconstruction technique. COMPARISON:  06/24/2022 FINDINGS: Brain: No evidence of acute infarction, hemorrhage, hydrocephalus, extra-axial collection or mass lesion/mass effect. Moderate low-density changes within the periventricular and subcortical white matter most compatible with chronic microvascular ischemic change. Mild-moderate diffuse cerebral volume loss. Vascular: Atherosclerotic calcifications involving the large vessels of the skull base. No unexpected hyperdense vessel. Skull: Normal. Negative for fracture or focal lesion. Sinuses/Orbits: No acute finding. Other: None. IMPRESSION: 1. No acute intracranial findings. 2. Chronic microvascular  ischemic change and cerebral volume loss. Electronically Signed   By: Davina Poke D.O.   On: 11/05/2022 13:39   DG Chest Port 1 View  Result Date: 11/05/2022 CLINICAL DATA:  Provided history: Altered mental status. EXAM: PORTABLE CHEST 1 VIEW COMPARISON:  Prior chest radiographs 06/24/2022 and earlier. FINDINGS: Shallow inspiration radiograph. Left chest multi-lead implantable cardiac device. Cardiomegaly. Aortic atherosclerosis. Perihilar opacities bilaterally. Chronic elevation of the right hemidiaphragm. No evidence of pleural effusion or pneumothorax. No acute bony abnormality identified. Degenerative changes of the bilateral glenohumeral and acromioclavicular joints, and of the thoracic spine. Dextrocurvature of the thoracic spine. IMPRESSION: 1. Shallow inspiration radiograph. 2. Cardiomegaly. 3. Bilateral perihilar opacities, which may reflect edema or infection. 4. Chronic elevation of the right hemidiaphragm. 5. Aortic Atherosclerosis (ICD10-I70.0). Electronically Signed   By: Kellie Simmering D.O.   On: 11/05/2022 13:05    Pending Labs Unresulted Labs (From admission, onward)     Start     Ordered   11/06/22 0022  Lactic acid, plasma  STAT Now then every 3 hours,   R      11/06/22 0021   11/05/22 1947  Culture, blood (Routine X 2) w Reflex to ID Panel  BLOOD CULTURE X 2,   R      11/05/22 1946            Vitals/Pain Today's Vitals   11/06/22 0216 11/06/22 0345 11/06/22 0415 11/06/22 0430  BP:   (!) 167/100 (!) 131/109  Pulse: 86 84 84  80  Resp:  (!) 27 20   Temp: 98.6 F (37 C)     TempSrc: Oral     SpO2:  91% 95% 92%  PainSc:        Isolation Precautions No active isolations  Medications Medications  enoxaparin (LOVENOX) injection 40 mg (40 mg Subcutaneous Given 11/05/22 2132)  labetalol (NORMODYNE) injection 5 mg (5 mg Intravenous Given 11/05/22 1908)  cefTRIAXone (ROCEPHIN) 1 g in sodium chloride 0.9 % 100 mL IVPB (has no administration in time range)  sodium  chloride 0.9 % bolus 1,000 mL (0 mLs Intravenous Stopped 11/05/22 1308)  cefTRIAXone (ROCEPHIN) 1 g in sodium chloride 0.9 % 100 mL IVPB (0 g Intravenous Stopped 11/05/22 1548)  magnesium sulfate IVPB 2 g 50 mL (0 g Intravenous Stopped 11/05/22 2336)  lactated ringers bolus 500 mL (0 mLs Intravenous Stopped 11/06/22 0151)    Mobility walks     R Recommendations: See Admitting Provider Note  Report given to:   Additional Notes:  Baseline A&Ox3, disoriented on time, decreased mental status over last week, worse last 3 days, LKW 1800, leaning to left with EMS on arrival, CXR bilat opacities, initial lactic 2.9, waiting on repeat, trop 34/42, BNP 290

## 2022-11-06 NOTE — Evaluation (Signed)
Clinical/Bedside Swallow Evaluation Patient Details  Name: Maria [name] MRN: [id number] Date of Birth: Oct 01, 1923  Today's Date: 11/06/2022 Time: SLP Start Time (ACUTE ONLY): 10 SLP Stop Time (ACUTE ONLY): [id number] SLP Time Calculation (min) (ACUTE ONLY): 20 min  Past Medical History:  Past Medical History:  Diagnosis Date   Anxiety    Arthritis    Cancer ([place])    breast 2015   Cardiac pacemaker in situ    Chest pain    Dyslipidemia    Dyspnea    with exertion   Family history of adverse reaction to anesthesia    daughter slow to wake up   Gallstones    Headache    Hypertension    Macular degeneration    Positive RPR test 07/08/2022   Pulmonary hypertension (HCC)    PA peak pressure 81 mmHg by 09/2016 echo   Type II or unspecified type diabetes mellitus without mention of complication, not stated as uncontrolled    Wears partial dentures    Past Surgical History:  Past Surgical History:  Procedure Laterality Date   BREAST EXCISIONAL BIOPSY Right 2010   CATARACT EXTRACTION     CHOLECYSTECTOMY N/A 01/18/2017   Procedure: LAPAROSCOPIC CHOLECYSTECTOMY;  Surgeon: Ralene Ok, MD;  Location: [facility];  Service: General;  Laterality: N/A;   IR GENERIC HISTORICAL  10/08/2016   IR PERC CHOLECYSTOSTOMY 10/08/2016 Greggory Keen, MD [facility]   IR RADIOLOGIST EVAL & MGMT  11/16/2016   KNEE SURGERY Bilateral    MULTIPLE TOOTH EXTRACTIONS     PACEMAKER INSERTION     [employer], [facility] 2003   PACEMAKER INSERTION     PPM GENERATOR CHANGEOUT N/A 04/16/2019   Procedure: PPM GENERATOR CHANGEOUT;  Surgeon: Evans Lance, MD;  Location: [facility];  Service: Cardiovascular;  Laterality: N/A;   TOTAL KNEE ARTHROPLASTY Bilateral    HPI:  Patient s a 89 y.o. female with PMH: Alzheimer's dementia, HTN, HLD, DM-2, CVA (2021), complete heart block s/p pacemaker 2003, breast cancer, OA, GERD. She presented to the hospital on 11/05/22 with AMS. She lives at home with daughter. In ED,  CT head negative for acute changes, CXR significant for cardiomegaly, bilateral perihilar opacities possibly reflecting edema versus infection. UA concerning for UTI. Patient failed [facility] with nursing and SLP swallow evaluation ordered.    Assessment / Plan / Recommendation  Clinical Impression  Patient presents with clinical s/s of dysphagia as per this bedside swallow evaluation. She was oriented to self and did state place as "hospital" but otherwise not oriented to situation or time. She told SLP that she was hungry. SLP assessed her toleration of thin liquids, nectar thick liquids and puree solids. With thin liquids, patient drank water via straw sips and had fairly consistent mildly delayed cough responses. With nectar thick liquids she did not exhibit any overt s/s aspiration or penetration. She initially had difficulty self-feeding applesauce via spoon but this improved  to where patient able to self feed with only supervision. No overt s/s aspiration or penetration with puree solids, no oral holding or oral phase delays. SLP recommending initiate PO diet of Dys 1 (puree) solids, nectar thick liquids. SLP will follow patient for diet toleration and ability to advance. SLP Visit Diagnosis: Dysphagia, unspecified (R13.10)    Aspiration Risk  Mild aspiration risk    Diet Recommendation Dysphagia 1 (Puree);Nectar-thick liquid   Liquid Administration via: Straw;Cup Medication Administration: Crushed with puree Supervision: Patient able to self feed;Full supervision/cueing for compensatory  strategies;Staff to assist with self feeding Compensations: Minimize environmental distractions;Slow rate;Small sips/bites Postural Changes: Seated upright at 90 degrees    Other  Recommendations Oral Care Recommendations: Oral care BID;Staff/trained caregiver to provide oral care Caregiver Recommendations: Avoid jello, ice cream, thin soups, popsicles;Other (Comment)    Recommendations for follow up therapy  are one component of a multi-disciplinary discharge planning process, led by the attending physician.  Recommendations may be updated based on patient status, additional functional criteria and insurance authorization.  Follow up Recommendations Other (comment) (SLP at next venue of care [facility] vs SNF))      Assistance Recommended at Discharge    Functional Status Assessment Patient has had a recent decline in their functional status and demonstrates the ability to make significant improvements in function in a reasonable and predictable amount of time.  Frequency and Duration min 2x/week  1 week       Prognosis Prognosis for improved oropharyngeal function: Good Barriers to Reach Goals: Cognitive deficits      Swallow Study   General Date of Onset: 11/05/22 HPI: Patient s a 89 y.o. female with PMH: Alzheimer's dementia, HTN, HLD, DM-2, CVA (2021), complete heart block s/p pacemaker 2003, breast cancer, OA, GERD. She presented to the hospital on 11/05/22 with AMS. She lives at home with daughter. In ED, CT head negative for acute changes, CXR significant for cardiomegaly, bilateral perihilar opacities possibly reflecting edema versus infection. UA concerning for UTI. Patient failed [facility] with nursing and SLP swallow evaluation ordered. Type of Study: Bedside Swallow Evaluation Previous Swallow Assessment: BSE and MBS in 2021 following CVA Diet Prior to this Study: NPO Temperature Spikes Noted: No Respiratory Status: Room air History of Recent Intubation: No Behavior/Cognition: Alert;Cooperative;Pleasant mood;Requires cueing Oral Cavity Assessment: Within Functional Limits Oral Care Completed by SLP: No Oral Cavity - Dentition: Adequate natural dentition;Missing dentition Vision: Functional for self-feeding Self-Feeding Abilities: Able to feed self;Needs set up;Needs assist Patient Positioning: Upright in bed Baseline Vocal Quality: Normal;Low vocal intensity Volitional Cough:  Cognitively unable to elicit Volitional Swallow: Able to elicit    Oral/Motor/Sensory Function Overall Oral Motor/Sensory Function: Within functional limits   Ice Chips     Thin Liquid Thin Liquid: Impaired Presentation: Straw;Self Fed Pharyngeal  Phase Impairments: Suspected delayed Swallow;Cough - Delayed    Nectar Thick Nectar Thick Liquid: Within functional limits Presentation: Straw;Self Fed   Honey Thick Honey Thick Liquid: Not tested   Puree Puree: Within functional limits Presentation: Spoon;Self Fed   Solid     Solid: Not tested      Sonia Baller, MA, CCC-SLP Speech Therapy

## 2022-11-06 NOTE — Evaluation (Signed)
Physical Therapy Evaluation Patient Details Name: ICOLE PLACHTA MRN: [id number] DOB: Jan 09, 1924 Today's Date: 11/06/2022  History of Present Illness  Patient is a 89 y/o female who presents on 2/23 with shoulder pain, leaning to the left and AMS. Found to have UTI. Discharged 05/2022 after being admitted for acute metabolic encephalopathy suspected 2/2 UTI vs pneumonia. PMH includes DM, HTN, severe pulmonary HTN, chronic respiratory failure, complete heart lock s/p PPM, breast ca 2015, macular degeneration, dementia, CVA, HF.  Clinical Impression  Patient presents with lethargy, generalized weakness, impaired balance, decreased activity tolerance, impaired cognition and impaired mobility s/p above. Pt is not able to provide good history/PLOF so info taken from chart and admission 5 months ago. Sounds like pt has assist for ADLs/IADLs and is a limited household ambulator with use of RW. Today, pt with decreased arousal so limited mobility assessment performed. Total A for bed mobility and Max A for sitting balance. Oriented to self only. Will need to perform further assessment/evaluation of mobility. At this time, recommending SNF to maximize independence and mobility unless family can provide level of assist needed to return home. Hoping mobility will improve with improved alertness.      Recommendations for follow up therapy are one component of a multi-disciplinary discharge planning process, led by the attending physician.  Recommendations may be updated based on patient status, additional functional criteria and insurance authorization.  Follow Up Recommendations Skilled nursing-short term rehab (<3 hours/day) (pending improvement/family support) Can patient physically be transported by private vehicle: No    Assistance Recommended at Discharge Frequent or constant Supervision/Assistance  Patient can return home with the following  Two people to help with walking and/or transfers;A lot of help  with bathing/dressing/bathroom;Assistance with cooking/housework;Direct supervision/assist for medications management;Assist for transportation;Direct supervision/assist for financial management    Equipment Recommendations None recommended by PT  Recommendations for Other Services       Functional Status Assessment Patient has had a recent decline in their functional status and/or demonstrates limited ability to make significant improvements in function in a reasonable and predictable amount of time     Precautions / Restrictions Precautions Precautions: Fall Restrictions Weight Bearing Restrictions: No      Mobility  Bed Mobility Overal bed mobility: Needs Assistance Bed Mobility: Supine to Sit, Sit to Supine     Supine to sit: Total assist, HOB elevated Sit to supine: Total assist, HOB elevated   General bed mobility comments: Pt not helping with mobility at this time total A for getting to EOB and returning to supine. Attempted to sit up with nurse inr oom to see if she could be more alert for her [facility] swallow screen, unable to participate.    Transfers                   General transfer comment: Deferre due to arousal.    Ambulation/Gait               General Gait Details: Deferred due to arousal.  Stairs            Wheelchair Mobility    Modified Rankin (Stroke Patients Only)       Balance Overall balance assessment: Needs assistance Sitting-balance support: Feet unsupported, No upper extremity supported Sitting balance-Leahy Scale: Zero Sitting balance - Comments: Max A for sitting balance, poor arousal/lethargic, no righting reactions, wanting to return to siupine Postural control: Left lateral lean  Pertinent Vitals/Pain Pain Assessment Pain Assessment: Faces Faces Pain Scale: Hurts even more Pain Location: LUE Pain Descriptors / Indicators: Moaning, Guarding, Grimacing Pain  Intervention(s): Monitored during session, Repositioned, Patient requesting pain meds-RN notified, Limited activity within patient's tolerance    Home Living Family/patient expects to be discharged to:: Private residence Living Arrangements: Alone;Children Available Help at Discharge: Family;Available PRN/intermittently Type of Home: House Home Access: Level entry       Home Layout: One level Home Equipment: Conservation officer, nature (2 wheels) Additional Comments: Pt very drowsey and not able to provide info regarding PLOF.    Prior Function Prior Level of Function : Needs assist;Patient poor historian/Family not available             Mobility Comments: Uses RW for ambulation, limited household ambulator ADLs Comments: Per chart, pt has some assist with all care. Family checks on her frequently and assists with IADLs/ADLs. Has HH coming to home as well to help with ADLs.     Hand Dominance   Dominant Hand: Right    Extremity/Trunk Assessment   Upper Extremity Assessment Upper Extremity Assessment: Defer to OT evaluation;Generalized weakness    Lower Extremity Assessment Lower Extremity Assessment: Generalized weakness;Difficult to assess due to impaired cognition    Cervical / Trunk Assessment Cervical / Trunk Assessment: Kyphotic  Communication   Communication: No difficulties  Cognition Arousal/Alertness: Lethargic Behavior During Therapy: Flat affect Overall Cognitive Status: Difficult to assess                                 General Comments: Thinks she is at home, minimal verbalizations due to arousal.        General Comments General comments (skin integrity, edema, etc.): Nurse present in room during assessment    Exercises     Assessment/Plan    PT Assessment Patient needs continued PT services  PT Problem List Decreased strength;Decreased mobility;Pain;Decreased balance;Decreased activity tolerance;Decreased cognition       PT Treatment  Interventions Therapeutic activities;Gait training;Therapeutic exercise;Patient/family education;Balance training;Functional mobility training;Wheelchair mobility training;Cognitive remediation    PT Goals (Current goals can be found in the Care Plan section)  Acute Rehab PT Goals Patient Stated Goal: "lay down" PT Goal Formulation: Patient unable to participate in goal setting Time For Goal Achievement: 11/20/22 Potential to Achieve Goals: Fair    Frequency Min 3X/week     Co-evaluation               AM-PAC PT "6 Clicks" Mobility  Outcome Measure Help needed turning from your back to your side while in a flat bed without using bedrails?: Total Help needed moving from lying on your back to sitting on the side of a flat bed without using bedrails?: Total Help needed moving to and from a bed to a chair (including a wheelchair)?: Total Help needed standing up from a chair using your arms (e.g., wheelchair or bedside chair)?: Total Help needed to walk in hospital room?: Total Help needed climbing 3-5 steps with a railing? : Total 6 Click Score: 6    End of Session   Activity Tolerance: Patient limited by lethargy Patient left: in bed;with call bell/phone within reach;with bed alarm set Nurse Communication: Mobility status PT Visit Diagnosis: Pain;Muscle weakness (generalized) (M62.81);Difficulty in walking, not elsewhere classified (R26.2) Pain - Right/Left: Left Pain - part of body: Shoulder;Arm    Time: [id number] PT Time Calculation (min) (ACUTE ONLY): 11 min  Charges:   PT Evaluation $PT Eval Moderate Complexity: 1 Mod          Marisa Severin, PT, DPT Acute Rehabilitation Services Secure chat preferred Office [place] 11/06/2022, 9:47 AM

## 2022-11-06 NOTE — Progress Notes (Signed)
OT Cancellation Note  Patient Details Name: Maria [name] MRN: [id number] DOB: [birth year]   Cancelled Treatment:    Reason Eval/Treat Not Completed: Other (comment) (very drowsy with PT. limited participation)  Will check on pt next day  [place], Thereasa Parkin 11/06/2022, 11:03 AM

## 2022-11-06 NOTE — Progress Notes (Signed)
Internal Medicine Attending:   I saw and examined the patient. I reviewed the resident's H&P note and I agree with the resident's findings and plan as documented in the resident's note.  In brief, patient is a 89 year old female with past medical history of hypertension, hyperlipidemia, type 2 diabetes, CVA, complete heart block status post pacemaker placement, chronic diastolic heart failure, breast cancer, OA, GERD, Alzheimer's and HSV who presented to the ED for altered mental status over the last week but significantly worsening over the last 3 days.  Patient unable to provide history at this time.  History obtained from chart.  Patient was noted to have worsening confusion over the last week and this is similar to her prior urinary tract infections.  In the ED, patient was noted to have a UA that was consistent with urinary tract infection as well as an elevated lactic acid up to 2.9.  Patient also noted to have bilateral perihilar opacities concerning for edema versus underlying infection.  On exam, patient is sleeping but easily arousable and in no apparent distress.  She denies any complaints currently but remains confused.  She responds to her name but is not oriented to time or place.  Lung exam reveals bilateral crackles.  Cardiovascular exam reveals regular rate and rhythm with normal heart sounds.  Abdomen is soft, mild right upper quadrant tenderness, nondistended.  Lower extremities are nontender to palpation with no edema noted.  Patient was admitted to the hospital with altered mental status in the setting of a likely underlying infection.  Her UA was consistent with urinary tract infection and she does have a history of urinary retention which could be contributing to this.  However, patient was also noted to have bilateral perihilar infiltrates and crackles on exam concerning for possible underlying pneumonia.  She also had some mild right upper quadrant tenderness and an elevated bilirubin  up to 1.9.  Would continue with ceftriaxone to cover for likely urinary tract infection as well as possible pneumonia and add doxycycline to cover for atypical pneumonia.  It is possible that the infiltrates on her chest x-ray are edema in the setting of her history of chronic diastolic heart failure as well as multiple fluid boluses given here for her low elevated lactic acid.  Will need to be cautious with IV hydration.  However, currently patient with O2 sats in the 90s on room air and no apparent respiratory distress.  Will continue to monitor closely.  Will obtain right upper quadrant sono for further evaluation given her elevated bilirubin and right upper quadrant tenderness.

## 2022-11-06 NOTE — Evaluation (Signed)
Occupational Therapy Evaluation Patient Details Name: Maria [name] MRN: [id number] DOB: 1924-08-25 Today's Date: 11/06/2022   History of Present Illness Patient is a 89 y/o female who presents on 2/23 with shoulder pain, leaning to the left and AMS. Found to have UTI. Discharged 05/2022 after being admitted for acute metabolic encephalopathy suspected 2/2 UTI vs pneumonia. PMH includes DM, HTN, severe pulmonary HTN, chronic respiratory failure, complete heart lock s/p PPM, breast ca 2015, macular degeneration, dementia, CVA, HF.   Clinical Impression   Pt reports needing assist at baseline with ADLs and mobility, lives with grandson who is there at night, but also has an aide daily 10am-1pm (per granddaughter) and reports daughter is "in and out" until grandson gets there at night. Pt needing min-max A for ADLs, mod A for bed mobility, and max A for squat pivot transfer to/from [facility]. Pt with L lateral lean in sitting, cues to bring body to midline. Pt with decreased cognition, oriented to self and place. Pt presenting with impairments listed below, will follow acutely. Recommend SNF at d/c.      Recommendations for follow up therapy are one component of a multi-disciplinary discharge planning process, led by the attending physician.  Recommendations may be updated based on patient status, additional functional criteria and insurance authorization.   Follow Up Recommendations  Skilled nursing-short term rehab (<3 hours/day)     Assistance Recommended at Discharge Frequent or constant Supervision/Assistance  Patient can return home with the following Two people to help with walking and/or transfers;A lot of help with bathing/dressing/bathroom;Assistance with cooking/housework;Direct supervision/assist for medications management;Direct supervision/assist for financial management;Assist for transportation;Help with stairs or ramp for entrance    Functional Status Assessment  Patient has had a  recent decline in their functional status and demonstrates the ability to make significant improvements in function in a reasonable and predictable amount of time.  Equipment Recommendations  Other (comment) (defer)    Recommendations for Other Services PT consult     Precautions / Restrictions Precautions Precautions: Fall Restrictions Weight Bearing Restrictions: No      Mobility Bed Mobility Overal bed mobility: Needs Assistance Bed Mobility: Supine to Sit, Sit to Supine     Supine to sit: Mod assist Sit to supine: Max assist   General bed mobility comments: assist for trunk elevation    Transfers Overall transfer level: Needs assistance Equipment used: 1 person hand held assist Transfers: Sit to/from Stand, Bed to chair/wheelchair/BSC Sit to Stand: Max assist   Squat pivot transfers: Max assist       General transfer comment: max A for pivot transfer to BSC, +2 helpful      Balance Overall balance assessment: Needs assistance Sitting-balance support: Feet unsupported, No upper extremity supported Sitting balance-Leahy Scale: Poor Sitting balance - Comments: L lateral lean   Standing balance support: During functional activity Standing balance-Leahy Scale: Poor Standing balance comment: reliant on external support                           ADL either performed or assessed with clinical judgement   ADL Overall ADL's : Needs assistance/impaired Eating/Feeding: Minimal assistance   Grooming: Minimal assistance   Upper Body Bathing: Moderate assistance   Lower Body Bathing: Maximal assistance   Upper Body Dressing : Moderate assistance   Lower Body Dressing: Maximal assistance   Toilet Transfer: Maximal assistance;Squat-pivot;BSC/3in1   Toileting- Clothing Manipulation and Hygiene: Total assistance       Functional  mobility during ADLs: Maximal assistance       Vision   Vision Assessment?: No apparent visual deficits      Perception Perception Perception Tested?: No   Praxis Praxis Praxis tested?: Not tested    Pertinent Vitals/Pain Pain Assessment Pain Assessment: Faces Pain Score: 6  Faces Pain Scale: Hurts even more Pain Location: generalized with movement Pain Descriptors / Indicators: Discomfort Pain Intervention(s): Limited activity within patient's tolerance, Monitored during session, Repositioned     Hand Dominance Right   Extremity/Trunk Assessment Upper Extremity Assessment Upper Extremity Assessment: Generalized weakness   Lower Extremity Assessment Lower Extremity Assessment: Defer to PT evaluation   Cervical / Trunk Assessment Cervical / Trunk Assessment: Kyphotic   Communication Communication Communication: No difficulties   Cognition Arousal/Alertness: Awake/alert Behavior During Therapy: Flat affect Overall Cognitive Status: Difficult to assess                                 General Comments: pt knows she is in the hospital, states it is february, giving home set up in from prior home per granddaughter     General Comments  VSS on RA    Exercises     Shoulder Instructions      Home Living Family/patient expects to be discharged to:: Private residence Living Arrangements: Alone;Children Available Help at Discharge: Family;Available PRN/intermittently Type of Home: House Home Access: Level entry     Home Layout: One level     Bathroom Shower/Tub: Teacher, early years/pre: Standard     Home Equipment: Conservation officer, nature (2 wheels)          Prior Functioning/Environment Prior Level of Function : Needs assist             Mobility Comments: RW and has assist for transfers ADLs Comments: grandson & daughter assist, has aide from 10am-1pm daily to assist with ADLs        OT Problem List: Decreased strength;Decreased range of motion;Decreased activity tolerance;Decreased cognition;Decreased safety awareness      OT  Treatment/Interventions: Self-care/ADL training;Therapeutic exercise;Energy conservation;DME and/or AE instruction;Therapeutic activities;Patient/family education;Balance training;Cognitive remediation/compensation    OT Goals(Current goals can be found in the care plan section) Acute Rehab OT Goals Patient Stated Goal: none stated OT Goal Formulation: With patient Time For Goal Achievement: 11/20/22 Potential to Achieve Goals: Good ADL Goals Pt Will Perform Upper Body Dressing: with min assist;sitting Pt Will Perform Lower Body Dressing: with min assist;sitting/lateral leans;sit to/from stand Pt Will Transfer to Toilet: with min assist;ambulating;regular height toilet Additional ADL Goal #1: pt will be min A for bed mobility in prep for ADLs Additional ADL Goal #2: pt will follow 2 step command in prep for ADLs  OT Frequency: Min 2X/week    Co-evaluation              AM-PAC OT "6 Clicks" Daily Activity     Outcome Measure Help from another person eating meals?: A Little Help from another person taking care of personal grooming?: A Little Help from another person toileting, which includes using toliet, bedpan, or urinal?: A Lot Help from another person bathing (including washing, rinsing, drying)?: A Lot Help from another person to put on and taking off regular upper body clothing?: A Lot Help from another person to put on and taking off regular lower body clothing?: A Lot 6 Click Score: 14   End of Session Equipment Utilized During Treatment: Gait belt  Nurse Communication: Mobility status;Other (comment) (+BM, wants thickened water)  Activity Tolerance: Patient tolerated treatment well Patient left: in bed;with call bell/phone within reach;with bed alarm set  OT Visit Diagnosis: Unsteadiness on feet (R26.81);Other abnormalities of gait and mobility (R26.89);Muscle weakness (generalized) (M62.81)                Time: [id number] OT Time Calculation (min): 36 min Charges:  OT  General Charges $OT Visit: 1 Visit OT Evaluation $OT Eval Moderate Complexity: 1 Mod OT Treatments $Self Care/Home Management : 8-22 mins  Renaye Rakers, OTD, OTR/L SecureChat Preferred Acute Rehab [phone]   Ulla Gallo 11/06/2022, 4:39 PM

## 2022-11-07 ENCOUNTER — Inpatient Hospital Stay (HOSPITAL_COMMUNITY): Payer: Medicare Other

## 2022-11-07 LAB — URINE CULTURE: Culture: >=100000 — AB

## 2022-11-07 LAB — COMPREHENSIVE METABOLIC PANEL
ALT: 19 U/L (ref 0–44)
AST: 21 U/L (ref 15–41)
Albumin: 3.4 g/dL — ABNORMAL LOW (ref 3.5–5.0)
Alkaline Phosphatase: 36 U/L — ABNORMAL LOW (ref 38–126)
Anion gap: 13 (ref 5–15)
BUN: 9 mg/dL (ref 8–23)
CO2: 23 mmol/L (ref 22–32)
Calcium: 8.8 mg/dL — ABNORMAL LOW (ref 8.9–10.3)
Chloride: 100 mmol/L (ref 98–111)
Creatinine, Ser: 0.72 mg/dL (ref 0.44–1.00)
GFR, Estimated: >60 mL/min (ref >60)
Glucose, Bld: 131 mg/dL — ABNORMAL HIGH (ref 70–99)
Potassium: 3.4 mmol/L — ABNORMAL LOW (ref 3.5–5.1)
Sodium: 136 mmol/L (ref 135–145)
Total Bilirubin: 1.6 mg/dL — ABNORMAL HIGH (ref 0.3–1.2)
Total Protein: 6.5 g/dL (ref 6.5–8.1)

## 2022-11-07 LAB — MAGNESIUM: Magnesium: 1.6 mg/dL — ABNORMAL LOW (ref 1.7–2.4)

## 2022-11-07 LAB — GLUCOSE, CAPILLARY
Glucose-Capillary: 137 mg/dL — ABNORMAL HIGH (ref 70–99)
Glucose-Capillary: 170 mg/dL — ABNORMAL HIGH (ref 70–99)

## 2022-11-07 MED ORDER — VALACYCLOVIR HCL 500 MG PO TABS: 1000.0000 mg | ORAL_TABLET | ORAL | Status: DC | PRN

## 2022-11-07 MED ORDER — POTASSIUM CHLORIDE 10 MEQ/100ML IV SOLN
10.0000 meq | INTRAVENOUS | Status: AC
  Administered 2022-11-07 (×2): 10 meq via INTRAVENOUS
  Filled 2022-11-07 (×2): qty 100

## 2022-11-07 MED ORDER — ROSUVASTATIN CALCIUM 5 MG PO TABS
5.0000 mg | ORAL_TABLET | Freq: Every day | ORAL | Status: DC
  Administered 2022-11-07 – 2022-11-09 (×3): 5 mg via ORAL
  Filled 2022-11-07 (×3): qty 1

## 2022-11-07 MED ORDER — POTASSIUM CHLORIDE 10 MEQ/100ML IV SOLN: 10.0000 meq | INTRAVENOUS | Status: DC

## 2022-11-07 MED ORDER — GERHARDT'S BUTT CREAM
1.0000 | TOPICAL_CREAM | Freq: Every day | CUTANEOUS | Status: DC
  Administered 2022-11-07 – 2022-11-11 (×5): 1 via TOPICAL
  Filled 2022-11-07: qty 1

## 2022-11-07 MED ORDER — SODIUM CHLORIDE 0.9 % IV SOLN
2.0000 g | INTRAVENOUS | Status: AC
  Administered 2022-11-07 – 2022-11-11 (×5): 2 g via INTRAVENOUS
  Filled 2022-11-07 (×5): qty 12.5

## 2022-11-07 MED ORDER — ASPIRIN 81 MG PO TBEC
81.0000 mg | DELAYED_RELEASE_TABLET | Freq: Every day | ORAL | Status: DC
  Administered 2022-11-07 – 2022-11-11 (×5): 81 mg via ORAL
  Filled 2022-11-07 (×5): qty 1

## 2022-11-07 MED ORDER — MELATONIN 3 MG PO TABS
3.0000 mg | ORAL_TABLET | Freq: Every day | ORAL | Status: DC
  Administered 2022-11-08 – 2022-11-10 (×4): 3 mg via ORAL
  Filled 2022-11-07 (×4): qty 1

## 2022-11-07 MED ORDER — MAGNESIUM SULFATE 4 GM/100ML IV SOLN
4.0000 g | Freq: Once | INTRAVENOUS | Status: AC
  Administered 2022-11-07: 4 g via INTRAVENOUS
  Filled 2022-11-07: qty 100

## 2022-11-07 MED ORDER — POTASSIUM CHLORIDE 20 MEQ PO PACK
40.0000 meq | PACK | Freq: Once | ORAL | Status: AC
  Administered 2022-11-07: 40 meq via ORAL
  Filled 2022-11-07: qty 2

## 2022-11-07 NOTE — Progress Notes (Signed)
Subjective:   Still somnolent.  Slightly confused.  Does answer her name but has difficulty answering place or time.  Objective:  Vital signs in last 24 hours: Vitals:   11/06/22 0500 11/06/22 0614 11/06/22 0755 11/06/22 1617  BP:  (!) 160/107 (!) 146/97 (!) 140/78  Pulse: 84 91 87 90  Resp: '19 17 16 15  '$ Temp:  98 F (36.7 C) 97.8 F (36.6 C) 97.9 F (36.6 C)  TempSrc:      SpO2: 93% 98% 100% 96%   Physical Exam: Constitutional: No acute distress Cardiovascular: Regular rate, regular rhythm. No murmurs, rubs, or gallops. Normal radial and PT pulses bilaterally. No LE edema.  Pulmonary: Normal respiratory effort. No wheezes.  Crackles bilateral lung bases    Neurological: Somnolent, awakens to voice, oriented to person, not place or time.  Does appear the same compared to yesterday.  Moves all extremities Skin: warm and dry. Decreased skin turgor. No ulcers of the feet.   Assessment/Plan:  Principal Problem:   Urinary tract infection   Maria [name] is a 89 y/o female with past medical medical history of HTN, HLD, T2DM, CVA (10/2019), complete heart block (s/p pacemaker placement 2003 with generator change 04/2019), HFpEF (Echo 09/2016 EF 50-55% with G1DD), breast cancer (2015), OA, GERD,Alzheimer Dementia, and HSV that presents for altered mental status, found to have UTI.       #Altered Mental Status #UTI # possible PNA #Alzheimer Dementia Urine culture returned positive for Pseudomonas so we will DC ceftriaxone and start cefepime for pseudomonal coverage.  She did have infiltrate versus edema on admission chest x-ray.  She has been receiving ceftriaxone which should treat infection, cefepime also cover for her pneumonia. She has been receiving doxycycline for atypical coverage. Bilateral crackles appear more pronounced today so we will obtain chest x-ray to further evaluate for possible volume overload.  Official read still pending but does appear to show some improvement in  perihilar opacity.  Patient is still satting well on room air neuro lactic acid has improved as well. Patient remains altered though mentation is slightly improved compared to admission.  Given her advanced age and history of dementia mentation may take longer to resolve than typical.  She was evaluated by SLP and cleared for dysphagia diet yesterday.  Will need SNF on discharge - Stop ceftriaxone, start cefepime, continue Doxy but will stop this if she shows clinical improvement with treatment of her Pseudomonas UTI. - Chest x-ray -Delirium precautions - Dysphagia diet - snf   #Hx of Complete Heart Block (s/p pacemaker 2003) #HFpEF (Echo 09/2016 EF 50-55% w/ G1DD) Bibasilar crackles some more pronounced today.  She has received fluids we will obtain chest x-ray to evaluate for possible pulmonary edema/overload fortunately still breathing well on room air.  Magnesium and potassium are low so we will replete these today. - CXR - Continue strict I's and O's -Replete electrolytes as necessary   #HTN Blood pressure is much improved compared to admission now in the 140s after fluids. Continue to monitor.   #T2DM A1c 6.0% 5 months ago. Taking glimepiride 1 mg BID and metformin 1000 mg daily at [facility] me. CBGs remain in the low 100s.  -Consider starting SSI once patient is tolerating PO   #Hyperbilirubinemia #Hx of Acute Cholecystitis  Patient has been receiving antibiotics for presumed UTI.  With antibiotic administration her T. Vergia Alcon has improved some as well.   #Hx of CVA (10/2019) #HLD Taking Aspirin 81 mg daily and Rosuvastatin 5 mg  daily at home. -Patient tolerating p.o., restart home meds   #OA Takes Tylenol 650 mg (2X BID) and Meloxicam 7.5 mg daily PRN at home.  -Start as necessary   #GERD On Pantoprazole 20 mg daily at home. Currently NPO at this time. -Consider restarting pantoprazole as necessary   #Hx of breast cancer (2015) Hx of right breast benign lumpectomy. Mammogram from  10/2013 demonstrates no evidence of malignancy.   #HSV Taking valacyclovir 1000 mg PRN at home.  #Dispo Patient with recurrent hospitalizations for AMS. Has help from son, daughter, and [place] PT at home but appears to need additional help. Suspect that her and her family would benefit from additional help at home. Will discuss with family regarding options.  ED has evaluated the patient and recommended SNF on discharge. -TOC consult   Diet: Dysphagia 1 Bowel: mirilax, senna VTE: lovenox IVF: none Code: DNR     Prior to Admission Living Arrangement: home with son and daughter Anticipated Discharge Location: TBD Barriers to Discharge: continued management Dispo: Anticipated discharge in approximately more than 2 day(s).   Delene Ruffini, MD 11/07/2022, 9:32 AM Pager: [phone] After 5pm on weekdays and 1pm on weekends: On Call pager [phone]

## 2022-11-07 NOTE — Progress Notes (Signed)
Pharmacy Antibiotic Note  Maria [name] is a 89 y.o. female admitted on 11/05/2022 with pneumonia and UTI. Pharmacy has been consulted for cefepime dosing.    SCr 0.72 and at baseline. WBC 7.5 and afebrile. Antibiotics escalated from ceftriaxone to cefepime as urine culture from 2/23 with >100,000 colonies/mL pseudomonas. Patient is also on doxycycline for atypical pneumonia.  Plan: Cefepime 2 g IV q24h Monitor renal function and clinical status     Temp (24hrs), Avg:97.9 F (36.6 C), Min:97.9 F (36.6 C), Max:97.9 F (36.6 C)  Recent Labs  Lab 11/05/22 1206 11/06/22 0042 11/06/22 0355 11/06/22 0937 11/06/22 1748 11/07/22 0446  WBC 8.0 7.5  --   --   --   --   CREATININE 0.71 0.68  --   --   --  0.72  LATICACIDVEN 2.9* 2.1* 2.1* 2.2* 1.8  --     CrCl cannot be calculated (Unknown ideal weight.).    Allergies  Allergen Reactions   Vancomycin Itching    Antimicrobials this admission: Ceftriaxone 2/23 >> 2/24 Cefepime 2/25 >> Doxycycline 2/24 >>   Dose adjustments this admission:   Microbiology results: 2/23 BCx: no growth x2 days 2/23 UCx: pseudomonas   Thank you for allowing pharmacy to be a part of this patient's care.  Jeneen Rinks [phone] AM

## 2022-11-07 NOTE — Plan of Care (Signed)

## 2022-11-08 ENCOUNTER — Other Ambulatory Visit: Payer: Self-pay

## 2022-11-08 DIAGNOSIS — G309 Alzheimer's disease, unspecified: Secondary | ICD-10-CM | POA: Diagnosis not present

## 2022-11-08 DIAGNOSIS — N390 Urinary tract infection, site not specified: Secondary | ICD-10-CM | POA: Diagnosis not present

## 2022-11-08 DIAGNOSIS — R4182 Altered mental status, unspecified: Secondary | ICD-10-CM | POA: Diagnosis not present

## 2022-11-08 MED ORDER — POLYVINYL ALCOHOL 1.4 % OP SOLN: 1.0000 [drp] | OPHTHALMIC | Status: DC | PRN

## 2022-11-08 MED ORDER — ACETAMINOPHEN 325 MG PO TABS
650.0000 mg | ORAL_TABLET | Freq: Four times a day (QID) | ORAL | Status: DC | PRN
  Administered 2022-11-08 – 2022-11-11 (×4): 650 mg via ORAL
  Filled 2022-11-08 (×4): qty 2

## 2022-11-08 MED ORDER — LOSARTAN POTASSIUM 50 MG PO TABS
50.0000 mg | ORAL_TABLET | Freq: Every day | ORAL | Status: DC
  Administered 2022-11-08 – 2022-11-11 (×4): 50 mg via ORAL
  Filled 2022-11-08 (×4): qty 1

## 2022-11-08 MED ORDER — DOXYCYCLINE HYCLATE 100 MG PO TABS
100.0000 mg | ORAL_TABLET | Freq: Two times a day (BID) | ORAL | Status: AC
  Administered 2022-11-08 (×2): 100 mg via ORAL
  Filled 2022-11-08 (×2): qty 1

## 2022-11-08 MED ORDER — POLYVINYL ALCOHOL 1.4 % OP SOLN
1.0000 [drp] | Freq: Every day | OPHTHALMIC | Status: DC
  Administered 2022-11-08 – 2022-11-11 (×4): 1 [drp] via OPHTHALMIC
  Filled 2022-11-08: qty 15

## 2022-11-08 MED ORDER — LATANOPROST 0.005 % OP SOLN
1.0000 [drp] | Freq: Every day | OPHTHALMIC | Status: DC
  Administered 2022-11-08 – 2022-11-10 (×3): 1 [drp] via OPHTHALMIC
  Filled 2022-11-08: qty 2.5

## 2022-11-08 NOTE — Progress Notes (Signed)
Physical Therapy Treatment Patient Details Name: Maria [name] MRN: [id number] DOB: [birth year] Today's Date: 11/08/2022   History of Present Illness Patient is a 89 y/o female who presents on 2/23 with shoulder pain, leaning to the left and AMS. Found to have UTI. Discharged 05/2022 after being admitted for acute metabolic encephalopathy suspected 2/2 UTI vs pneumonia. PMH includes DM, HTN, severe pulmonary HTN, chronic respiratory failure, complete heart lock s/p PPM, breast ca 2015, macular degeneration, dementia, CVA, HF.    PT Comments    Pt more alert than on eval but has significant cognitive deficits. Was able to eventually stand at bedside with moderate assistance but was then incontinent of stool and urine so unable to progress further. Continue to recommend SNF.    Recommendations for follow up therapy are one component of a multi-disciplinary discharge planning process, led by the attending physician.  Recommendations may be updated based on patient status, additional functional criteria and insurance authorization.  Follow Up Recommendations  Skilled nursing-short term rehab (<3 hours/day) Can patient physically be transported by private vehicle: No   Assistance Recommended at Discharge Frequent or constant Supervision/Assistance  Patient can return home with the following Two people to help with walking and/or transfers;A lot of help with bathing/dressing/bathroom;Assistance with cooking/housework;Direct supervision/assist for medications management;Direct supervision/assist for financial management;Help with stairs or ramp for entrance   Equipment Recommendations  Other (comment) (TBD at next venue)    Recommendations for Other Services       Precautions / Restrictions Precautions Precautions: Fall Restrictions Weight Bearing Restrictions: No     Mobility  Bed Mobility Overal bed mobility: Needs Assistance Bed Mobility: Supine to Sit, Sit to Supine     Supine to  sit: Max assist Sit to supine: Max assist   General bed mobility comments: Assist to bring legs off ot bed, elevate trunk into sitting and bring hips to EOB. Assist to lower trunk and bring legs back up into bed.    Transfers Overall transfer level: Needs assistance Equipment used: Rolling walker (2 wheels) Transfers: Sit to/from Stand Sit to Stand: Mod assist           General transfer comment: Pt with difficulty attending and processing to stand. On first 3 attempts pt made no effort. On 4th attempt pt assisting and able to stand with assist to bring hips up. Continued cues to fully extend hips/trunk.    Ambulation/Gait               General Gait Details: Unable to attempt due to incontinent of stool and urine   Stairs             Wheelchair Mobility    Modified Rankin (Stroke Patients Only)       Balance Overall balance assessment: Needs assistance Sitting-balance support: Bilateral upper extremity supported, Feet supported Sitting balance-Leahy Scale: Poor Sitting balance - Comments: UE support and pt with min guard at times and then would fall posteriorly requiring max assist to correct Postural control: Posterior lean Standing balance support: During functional activity, Bilateral upper extremity supported Standing balance-Leahy Scale: Poor Standing balance comment: walker and mod assist to maintain static standing                            Cognition Arousal/Alertness: Awake/alert Behavior During Therapy: Flat affect Overall Cognitive Status: No family/caregiver present to determine baseline cognitive functioning Area of Impairment: Orientation, Attention, Memory, Following commands, Safety/judgement, Awareness, Problem solving  Orientation Level: Disoriented to, Place, Time, Situation Current Attention Level: Sustained Memory: Decreased short-term memory Following Commands: Follows one step commands  inconsistently, Follows one step commands with increased time Safety/Judgement: Decreased awareness of deficits, Decreased awareness of safety Awareness: Intellectual Problem Solving: Slow processing, Decreased initiation, Difficulty sequencing, Requires verbal cues, Requires tactile cues          Exercises      General Comments        Pertinent Vitals/Pain Pain Assessment Pain Assessment: PAINAD Breathing: normal Negative Vocalization: none Facial Expression: smiling or inexpressive Body Language: relaxed Consolability: no need to console PAINAD Score: 0    Home Living                          Prior Function            PT Goals (current goals can now be found in the care plan section) Progress towards PT goals: Progressing toward goals    Frequency    Min 2X/week      PT Plan Current plan remains appropriate;Frequency needs to be updated    Co-evaluation              AM-PAC PT "6 Clicks" Mobility   Outcome Measure  Help needed turning from your back to your side while in a flat bed without using bedrails?: A Lot Help needed moving from lying on your back to sitting on the side of a flat bed without using bedrails?: A Lot Help needed moving to and from a bed to a chair (including a wheelchair)?: Total Help needed standing up from a chair using your arms (e.g., wheelchair or bedside chair)?: A Lot Help needed to walk in hospital room?: Total Help needed climbing 3-5 steps with a railing? : Total 6 Click Score: 9    End of Session   Activity Tolerance: Patient limited by fatigue Patient left: in bed;with call bell/phone within reach;with nursing/sitter in room Nurse Communication: Mobility status PT Visit Diagnosis: Muscle weakness (generalized) (M62.81);Difficulty in walking, not elsewhere classified (R26.2)     Time: 1530-1605 PT Time Calculation (min) (ACUTE ONLY): 35 min  Charges:  $Therapeutic Activity: 23-37 mins                      [place] 11/08/2022, 6:24 PM

## 2022-11-08 NOTE — Progress Notes (Signed)
(  Charting error -- disregard)

## 2022-11-08 NOTE — Progress Notes (Addendum)
Subjective:   Patient interviewed at bedside.  Less confused today.  Able to tell me her name but is unsure of where she is at and why she is here.  Unable to tell me the date.  Not complaining of any pain.  She is enjoying her breakfast.    Objective:  Vital signs in last 24 hours: Vitals:   11/06/22 1617 11/07/22 1203 11/07/22 2021 11/08/22 0547  BP: (!) 140/78 (!) 131/100 129/60 (!) 190/80  Pulse: 90 80 79 (!) 59  Resp: '15 16 17 17  '$ Temp: 97.9 F (36.6 C) 98.5 F (36.9 C) 99.2 F (37.3 C) 98.7 F (37.1 C)  TempSrc:  Oral Oral   SpO2: 96% 100% 97% 100%  Weight:  59.8 kg     Physical Exam: Constitutional: No acute distress, somewhat confused Cardiovascular: Regular rate, regular rhythm. No murmurs, rubs, or gallops. Normal radial and PT pulses bilaterally. No LE edema.  Pulmonary: Normal respiratory effort. No wheezes or crackles.  Abdominal: normal bowel sounds, no tenderness to palpation    Neurological: Alert to self but not time, place, or situation, moving all extremities spontaneously   Skin: warm and dry. Decreased skin turgor.   Assessment/Plan:  Principal Problem:   Urinary tract infection   Maria [name] is a 89 y/o female with past medical medical history of HTN, HLD, T2DM, CVA (10/2019), complete heart block (s/p pacemaker placement 2003 with generator change 04/2019), HFpEF (Echo 09/2016 EF 50-55% with G1DD), breast cancer (2015), OA, GERD,Alzheimer Dementia, and HSV that presents for altered mental status, found to have UTI.       #Altered Mental Status #UTI #Alzheimer Dementia UA concerning for UTI on admission. CXR w/ edema vs infiltrate on admission. Urine culture positive for Pseudomonas. Transitioned form ceftriaxone to cefepime. Doxycyline was started for atypical coverage due to concern for possible pneumonia. Repeat CXR on 2/25 demonstrates improving bilateral perihilar/left basilar opacities. No crackles on exam. Continues to be satting well on RA.  Remains afebrile. Lower suspicion for pneumonia at this time. Will discontinue doxycycline after today for this reason. Will continue treating UTI w/ cefepime.  - Continue IV cefepime 2g - Last day of IV Doxycycline 100 mg q12 hours -Delirium precautions - Dysphagia 1 diet   #Hx of Complete Heart Block (s/p pacemaker 2003) #HFpEF (Echo 09/2016 EF 50-55% w/ G1DD) Repeat CXR on 2/25 demonstrates improving bilateral perihilar/left basilar opacities. On exam, no crackles or LE noted. Appears euvolemic at this time.   - Continue strict I's and O's   #HTN Intermittently hypertensive. Tolerating PO now. Renal function stable. Will restart home losartan 50 mg daily. Will continue to monitor. -Stop IV Labetalol  -Start Losartan 50 mg daily  #Diarrhea 3x loose stools overnight. No abdominal pain on exam.  -Pending GI panel   #T2DM A1c 6.0% 5 months ago. On glimepiride 1 mg BID and metformin 1000 mg daily at [facility] me. CBGs remain in the low-mid 100s.  -Consider starting SSI once patient eating more   #Hyperbilirubinemia #Hx of Acute Cholecystitis  S/p cholecystectomy. T. Bili improving since starting abx. Will continue to monitor.  -Trend CMP   #Hx of CVA (10/2019) #HLD On Aspirin 81 mg daily and Rosuvastatin 5 mg daily at home. -Continue aspirin 81 mg daily -Continue rosuvastatin 5 mg daily   #OA On Tylenol 650 mg (2X BID) and Meloxicam 7.5 mg daily PRN at home.  -Tylenol 650 mg q6 PRN   #GERD Taking Pantoprazole 20 mg daily at home.  -  Consider restarting pantoprazole as necessary   #Hx of breast cancer (2015) Hx of right breast benign lumpectomy. Mammogram from 10/2013 demonstrates no evidence of malignancy.   #HSV On valacyclovir 1000 mg PRN at home. No complaints of flare at this time. Will hold for now.    #Dispo Recurrent hospitalizations for AMS. Has help from son/daughter/HH PT at home. Will likely need SNF at discharge.  -Appreciate TOC help   Diet: Dysphagia 1 Bowel:  mirilax, senna VTE: lovenox IVF: none Code: DNR PT/OT recs: SNF     Prior to Admission Living Arrangement: home with son and daughter Anticipated Discharge Location: TBD Barriers to Discharge: continued management Dispo: Anticipated discharge in approximately more than 2 day(s).   Starlyn Skeans, MD 11/08/2022, 6:30 AM Pager: [phone] After 5pm on weekdays and 1pm on weekends: On Call pager [phone]

## 2022-11-08 NOTE — TOC Initial Note (Addendum)
Transition of Care [facility]) - Initial/Assessment Note    Patient Details  Name: Maria [name] MRN: [id number] Date of Birth: 03/29/1924  Transition of Care [facility]) CM/SW Contact:    Jinger Neighbors, LCSW Phone Number: 11/08/2022, 1:15 PM  Clinical Narrative:                  CSW reviewed pt's chart and checked in on pt. Pt unable to answer assessment questions. CSW called pt's dtr and discussed PT recommendations for SNF; dtr in agreement to SNF for STR. Dtr reports she does not want her Mom going to [facility] or [facility] [employer]. CSW will complete work up and fax out.   Work up and fax out complete.    Expected Discharge Plan: Skilled Nursing Facility Barriers to Discharge: Continued Medical Work up   Patient Goals and CMS Choice     Choice offered to / list presented to : Adult [place] ownership interest in [facility].provided to:: Adult Children    Expected Discharge Plan and Services       Living arrangements for the past 2 months: Single Family Home                                      Prior Living Arrangements/Services Living arrangements for the past 2 months: Single Family Home Lives with:: Adult Children   Do you feel safe going back to the place where you live?: Yes      Need for Family Participation in Patient Care: Yes (Comment) Care giver support system in place?: Yes (comment)   Criminal Activity/Legal Involvement Pertinent to Current Situation/Hospitalization: No - Comment as needed  Activities of Daily Living      Permission Sought/Granted                  Emotional Assessment Appearance:: Appears stated age            Admission diagnosis:  Urinary tract infection [N39.0] Cystitis [N30.90] Altered mental status, unspecified altered mental status type [R41.82] Patient Active Problem List   Diagnosis Date Noted   Urinary tract infection 11/05/2022   Positive RPR test 07/08/2022   Pneumonia of right lung due to  infectious organism 06/03/2022   Alzheimer's dementia with behavioral disturbance ([place]) 06/03/2022   Prolonged QT interval XX123456   Toxic metabolic encephalopathy XX123456   Acute metabolic encephalopathy 123456   Acute cystitis without hematuria 06/02/2022   Chronic diastolic CHF (congestive heart failure) ([place]) 06/02/2022   Mixed diabetic hyperlipidemia associated with type 2 diabetes mellitus ([place]) 06/02/2022   GERD without esophagitis 06/02/2022   At risk for falls 03/11/2021   Bilateral hearing loss 03/11/2021   Dementia with behavioral disturbance ([place]) 03/11/2021   Depressive disorder 03/11/2021   Pulmonary hypertension ([place]) 03/11/2021   Insomnia 12/23/2020   Dysphagia, unspecified 12/23/2020   Primary generalized (osteo)arthritis 12/15/2020   Occlusion and stenosis of unspecified carotid artery 09/13/2020   Long term (current) use of oral hypoglycemic drugs 09/13/2020   History of falling 09/13/2020   Retention of urine, unspecified 09/13/2020   Unspecified urinary incontinence 09/13/2020   Severe sepsis ([place]) 08/08/2020   Acute kidney injury ([place]) 08/08/2020   Acute UTI (urinary tract infection) 08/08/2020   Carotid atherosclerosis 05/29/2020   Multinodular goiter 05/29/2020   Multiple lacunar infarcts ([place]) 05/29/2020   Abnormal serum thyroid stimulating hormone (TSH) level 05/28/2020   Constipation 11/27/2019  Impaired cognition 11/27/2019   Elevated lactic acid level 10/17/2019   Generalized weakness 10/17/2019   Hypokalemia 10/17/2019   Dehydration 10/17/2019   Acute encephalopathy 10/16/2019   Heart block AV complete ([place]) 04/13/2019   Normocytic anemia 11/01/2018   Diverticulosis of colon 07/22/2018   Mixed hyperlipidemia 11/22/2017   Nephropathy due to secondary diabetes mellitus ([place]) 11/22/2017   Polyneuropathy due to type 2 diabetes mellitus ([place]) 06/30/2017   Acute acalculous cholecystitis 10/07/2016   Elevated troponin 10/07/2016   Lactic  acidosis 10/07/2016   Sinus tachycardia 10/07/2016   Dyslipidemia 06/05/2013   Cardiac pacemaker 05/30/2013   Type 2 diabetes mellitus with diabetic polyneuropathy, without long-term current use of insulin ([place]) 05/21/2013   Chest pain 05/20/2013   Essential hypertension 05/20/2013   PCP:  Audley Hose, MD Pharmacy:   [facility] [address], [place] - [place] [address] Phone: [phone] Fax: [phone]  [facility] [phone] - [place], [place] [address] [place] [address] Phone: [phone] Fax: [phone]     Social Determinants of Health (SDOH) Social History: [place]: No Food Insecurity (11/06/2022)  Housing: Low Risk  (07/26/2022)  Transportation Needs: No Transportation Needs (11/06/2022)  Utilities: Not At Risk (11/06/2022)  Financial Resource Strain: Low Risk  (08/02/2022)  Tobacco Use: High Risk (11/05/2022)   SDOH Interventions:     Readmission Risk Interventions     No data to display

## 2022-11-08 NOTE — NC FL2 (Signed)
[place] LEVEL OF CARE FORM     IDENTIFICATION  Patient Name: Maria [name] Birthdate: 1923/10/29 Sex: female Admission Date (Current Location): 11/05/2022  [facility] and [place] Number:  Herbalist and Address:  [facility] [place] [address] [place], [place] [address]      Provider Number: [id number]  Attending Physician Name and Address:  Charise Killian, MD  Relative Name and Phone Number:  Dearman,[name] Daughter [phone]  [phone]    Current Level of Care: Hospital Recommended Level of Care: [place] Prior Approval Number:    Date Approved/Denied:   PASRR Number: [id number]  Discharge Plan: SNF    Current Diagnoses: Patient Active Problem List   Diagnosis Date Noted   Urinary tract infection 11/05/2022   Positive RPR test 07/08/2022   Pneumonia of right lung due to infectious organism 06/03/2022   Alzheimer's dementia with behavioral disturbance ([employer]) 06/03/2022   Prolonged QT interval XX123456   Toxic metabolic encephalopathy XX123456   Acute metabolic encephalopathy 123456   Acute cystitis without hematuria 06/02/2022   Chronic diastolic CHF (congestive heart failure) ([place]) 06/02/2022   Mixed diabetic hyperlipidemia associated with type 2 diabetes mellitus ([place]) 06/02/2022   GERD without esophagitis 06/02/2022   At risk for falls 03/11/2021   Bilateral hearing loss 03/11/2021   Dementia with behavioral disturbance ([place]) 03/11/2021   Depressive disorder 03/11/2021   Pulmonary hypertension ([place]) 03/11/2021   Insomnia 12/23/2020   Dysphagia, unspecified 12/23/2020   Primary generalized (osteo)arthritis 12/15/2020   Occlusion and stenosis of unspecified carotid artery 09/13/2020   Long term (current) use of oral hypoglycemic drugs 09/13/2020   History of falling 09/13/2020   Retention of urine, unspecified 09/13/2020   Unspecified urinary incontinence 09/13/2020   Severe sepsis ([place])  08/08/2020   Acute kidney injury ([place]) 08/08/2020   Acute UTI (urinary tract infection) 08/08/2020   Carotid atherosclerosis 05/29/2020   Multinodular goiter 05/29/2020   Multiple lacunar infarcts (HCC) 05/29/2020   Abnormal serum thyroid stimulating hormone (TSH) level 05/28/2020   Constipation 11/27/2019   Impaired cognition 11/27/2019   Elevated lactic acid level 10/17/2019   Generalized weakness 10/17/2019   Hypokalemia 10/17/2019   Dehydration 10/17/2019   Acute encephalopathy 10/16/2019   Heart block AV complete ([place]) 04/13/2019   Normocytic anemia 11/01/2018   Diverticulosis of colon 07/22/2018   Mixed hyperlipidemia 11/22/2017   Nephropathy due to secondary diabetes mellitus ([place]) 11/22/2017   Polyneuropathy due to type 2 diabetes mellitus ([place]) 06/30/2017   Acute acalculous cholecystitis 10/07/2016   Elevated troponin 10/07/2016   Lactic acidosis 10/07/2016   Sinus tachycardia 10/07/2016   Dyslipidemia 06/05/2013   Cardiac pacemaker 05/30/2013   Type 2 diabetes mellitus with diabetic polyneuropathy, without long-term current use of insulin ([place]) 05/21/2013   Chest pain 05/20/2013   Essential hypertension 05/20/2013    Orientation RESPIRATION BLADDER Height & Weight     Self  Normal Incontinent, External catheter Weight: 131 lb 13.4 oz (59.8 kg) Height:     BEHAVIORAL SYMPTOMS/MOOD NEUROLOGICAL BOWEL NUTRITION STATUS      Incontinent Diet (see d/c summary)  AMBULATORY STATUS COMMUNICATION OF NEEDS Skin   Extensive Assist   Normal                       Personal Care Assistance Level of Assistance  Bathing, Feeding, Dressing Bathing Assistance: Maximum assistance Feeding assistance: Maximum assistance Dressing Assistance: Maximum assistance     Functional Limitations Info  Sight, Hearing, Speech Sight Info: Adequate Hearing Info: Adequate Speech Info: Impaired    SPECIAL CARE FACTORS FREQUENCY  PT (By licensed PT), OT (By licensed OT)     PT  Frequency: 5x/wk OT Frequency: 5x/wk            Contractures Contractures Info: Not present    Additional Factors Info  Code Status Code Status Info: DNR             Current Medications (11/08/2022):  This is the current hospital active medication list Current Facility-Administered Medications  Medication Dose Route Frequency Provider Last Rate Last Admin   acetaminophen (TYLENOL) tablet 650 mg  650 mg Oral Q6H PRN Angelique Blonder, DO   650 mg at 11/08/22 0012   aspirin EC tablet 81 mg  81 mg Oral Daily Delene Ruffini, MD   81 mg at 11/08/22 0943   ceFEPIme (MAXIPIME) 2 g in sodium chloride 0.9 % 100 mL IVPB  2 g Intravenous [id number] Jeneen Rinks, RPH 200 mL/hr at 11/08/22 1006 2 g at 11/08/22 1006   doxycycline (VIBRA-TABS) tablet 100 mg  100 mg Oral Q12H Delene Ruffini, MD   100 mg at 11/08/22 0943   enoxaparin (LOVENOX) injection 40 mg  40 mg Subcutaneous Q24H Delene Ruffini, MD   40 mg at 11/07/22 2110   Gerhardt's butt cream 1 Application  1 Application Topical Daily Delene Ruffini, MD   1 Application at 0000000 0943   labetalol (NORMODYNE) injection 5 mg  5 mg Intravenous Q2H PRN Mapp, Tavien, MD   5 mg at 11/05/22 1908   latanoprost (XALATAN) 0.005 % ophthalmic solution 1 drop  1 drop Both Eyes QHS Mapp, Tavien, MD       melatonin tablet 3 mg  3 mg Oral QHS Angelique Blonder, DO   3 mg at 11/08/22 0012   polyvinyl alcohol (LIQUIFILM TEARS) 1.4 % ophthalmic solution 1 drop  1 drop Both Eyes Daily Mapp, Tavien, MD   1 drop at 11/08/22 1119   polyvinyl alcohol (LIQUIFILM TEARS) 1.4 % ophthalmic solution 1 drop  1 drop Both Eyes PRN Mapp, Tavien, MD       rosuvastatin (CRESTOR) tablet 5 mg  5 mg Oral Daily Delene Ruffini, MD   5 mg at 11/08/22 0943   valACYclovir (VALTREX) tablet 1,000 mg  1,000 mg Oral PRN Delene Ruffini, MD         Discharge Medications: Please see discharge summary for a list of discharge medications.  Relevant Imaging  Results:  Relevant Lab Results:   Additional Information SS#: [id number]  Jinger Neighbors, LCSW

## 2022-11-08 NOTE — Progress Notes (Signed)
Speech Language Pathology Treatment: Dysphagia  Patient Details Name: Maria [name] MRN: [id number] DOB: 11-Sep-1924 Today's Date: 11/08/2022 Time: [id number] SLP Time Calculation (min) (ACUTE ONLY): 22 min  Assessment / Plan / Recommendation Clinical Impression  Pt seen for dysphagia f/u tx session with oral care provided prior to PO attempts.  Pt required nursing A during session d/t regurgitation observed on gown/IV removal prior to SLP arrival.  SLP A with nursing staff to provide improved positioning prior to oral intake.  Pt consumed guided straw sips of thin liquids with initial brief oral holding prior to swallow initiation, but no overt s/sx of aspiration present during intake. Due to pt's cognitive status/confusion, recommend progressing liquids to thin, but continue current diet of Dysphagia 1 until mentation improves to baseline status.  Recommend progressing diet to Dysphagia 1/thin liquids with FULL precautions/caregiver to feed pt during oral intake (1:1).  ST will continue to f/u in acute setting for dysphagia tx/management.  HPI HPI: Patient s a 89 y.o. female with PMH: Alzheimer's dementia, HTN, HLD, DM-2, CVA (2021), complete heart block s/p pacemaker 2003, breast cancer, OA, GERD. She presented to the hospital on 11/05/22 with AMS. She lives at home with daughter. In ED, CT head negative for acute changes, CXR significant for cardiomegaly, bilateral perihilar opacities possibly reflecting edema versus infection. UA concerning for UTI. Patient failed [place] with nursing and SLP swallow evaluation ordered with subsequent diet of Dysphagia 1/nectar-thickened liquids ordered.  ST f/u for diet progression.      SLP Plan  Goals updated      Recommendations for follow up therapy are one component of a multi-disciplinary discharge planning process, led by the attending physician.  Recommendations may be updated based on patient status, additional functional criteria and insurance  authorization.    Recommendations  Diet recommendations: Dysphagia 1 (puree);Thin liquid Liquids provided via: Straw Medication Administration: Crushed with puree Supervision: Full supervision/cueing for compensatory strategies;Trained caregiver to feed patient Compensations: Slow rate;Small sips/bites;Minimize environmental distractions;Other (Comment) (cue pt to swallow d/t oral holding/inattention) Postural Changes and/or Swallow Maneuvers: Seated upright 90 degrees                Oral Care Recommendations: Oral care BID;Staff/trained caregiver to provide oral care Follow Up Recommendations: Follow physician's recommendations for discharge plan and follow up therapies Assistance recommended at discharge: Frequent or constant Supervision/Assistance SLP Visit Diagnosis: Dysphagia, oropharyngeal phase (R13.12) Plan: Goals updated           Elvina Sidle, M.S., CCC-SLP  11/08/2022, 12:59 PM

## 2022-11-09 DIAGNOSIS — G309 Alzheimer's disease, unspecified: Secondary | ICD-10-CM | POA: Diagnosis not present

## 2022-11-09 DIAGNOSIS — R4182 Altered mental status, unspecified: Secondary | ICD-10-CM | POA: Diagnosis not present

## 2022-11-09 DIAGNOSIS — N390 Urinary tract infection, site not specified: Secondary | ICD-10-CM | POA: Diagnosis not present

## 2022-11-09 LAB — GASTROINTESTINAL PANEL BY PCR, STOOL (REPLACES STOOL CULTURE)

## 2022-11-09 LAB — COMPREHENSIVE METABOLIC PANEL
ALT: 16 U/L (ref 0–44)
AST: 34 U/L (ref 15–41)
Albumin: 3.8 g/dL (ref 3.5–5.0)
Alkaline Phosphatase: 40 U/L (ref 38–126)
Anion gap: 15 (ref 5–15)
BUN: 12 mg/dL (ref 8–23)
CO2: 22 mmol/L (ref 22–32)
Calcium: 9.4 mg/dL (ref 8.9–10.3)
Chloride: 97 mmol/L — ABNORMAL LOW (ref 98–111)
Creatinine, Ser: 0.66 mg/dL (ref 0.44–1.00)
GFR, Estimated: >60 mL/min (ref >60)
Glucose, Bld: 151 mg/dL — ABNORMAL HIGH (ref 70–99)
Potassium: 3.7 mmol/L (ref 3.5–5.1)
Sodium: 134 mmol/L — ABNORMAL LOW (ref 135–145)
Total Bilirubin: 2.3 mg/dL — ABNORMAL HIGH (ref 0.3–1.2)
Total Protein: 7.5 g/dL (ref 6.5–8.1)

## 2022-11-09 LAB — GLUCOSE, CAPILLARY
Glucose-Capillary: 189 mg/dL — ABNORMAL HIGH (ref 70–99)
Glucose-Capillary: 191 mg/dL — ABNORMAL HIGH (ref 70–99)
Glucose-Capillary: 162 mg/dL — ABNORMAL HIGH (ref 70–99)
Glucose-Capillary: 173 mg/dL — ABNORMAL HIGH (ref 70–99)

## 2022-11-09 MED ORDER — MAGNESIUM SULFATE 2 GM/50ML IV SOLN
2.0000 g | Freq: Once | INTRAVENOUS | Status: AC
  Administered 2022-11-09: 2 g via INTRAVENOUS
  Filled 2022-11-09: qty 50

## 2022-11-09 MED ORDER — LACTATED RINGERS IV SOLN: INTRAVENOUS | Status: AC

## 2022-11-09 MED ORDER — LACTATED RINGERS IV SOLN: INTRAVENOUS | Status: DC

## 2022-11-09 MED ORDER — LACTATED RINGERS IV BOLUS
500.0000 mL | Freq: Once | INTRAVENOUS | Status: AC
  Administered 2022-11-09: 500 mL via INTRAVENOUS

## 2022-11-09 NOTE — Progress Notes (Signed)
   11/09/22 0453  Provider Notification  Provider Name/Title Dr. Markus Jarvis  Date Provider Notified 11/09/22  Time Provider Notified [phone]  Method of Notification Page  Notification Reason Critical Result (stool positive for Norovirus. Pt currently on enteric precautions.)  Provider response No new orders  Date of Provider Response 11/09/22  Time of Provider Response [phone]

## 2022-11-09 NOTE — TOC Progression Note (Signed)
Transition of Care [facility]) - Progression Note    Patient Details  Name: Maria [name] MRN: [id number] Date of Birth: 04/25/1924  Transition of Care [facility]) CM/SW [place], [place] Phone Number: 11/09/2022, 10:56 AM  Clinical Narrative:     CSW called pt's daughter and provided SNF bed offers verbally. She chooses [facility] as first choice as she is familiar with the facility and states pt has been there in the past.   CSW confirmed bed offer with [facility]. TOC will follow to assist with DC to SNF once medically stable.   Expected Discharge Plan: [place] Barriers to Discharge: Continued Medical Work up  Expected Discharge Plan and Services       Living arrangements for the past 2 months: Single Family Home                                       Social Determinants of Health (SDOH) Interventions SDOH Screenings   Food Insecurity: No Food Insecurity (11/06/2022)  Housing: Low Risk  (07/26/2022)  Transportation Needs: No Transportation Needs (11/06/2022)  Utilities: Not At Risk (11/06/2022)  Financial Resource Strain: Low Risk  (08/02/2022)  Tobacco Use: High Risk (11/05/2022)    Readmission Risk Interventions     No data to display

## 2022-11-09 NOTE — Progress Notes (Addendum)
HD#4 SUBJECTIVE:  Patient Summary: Maria [name] is a 89 y.o. with a pertinent PMH of HTN, HLD, T2DM, CVA (10/2019), complete heart block (s/p pacemaker placement 2003 with generator change 04/2019), HFpEF (Echo 09/2016 EF 50-55% with G1DD), breast cancer (2015), OA, GERD, Alzheimer Dementia, and HSV who presented with altered mental status and admitted for a UTI.   Overnight Events: Per nurse, patient has not had much PO intake (half a waffle for breakfast). Visited patient at bedside and patient was able to eat a few bites of lunch with encouragement and assistance with feeding. Confusion seems to be improving. Oriented to her name and can remember her daughter visited her yesterday, but unsure of where she is or why she is here. No complaints of pain, although she may have been nauseous earlier today. She is not complaining of any pain.   Interm History: Patient's stool tested positive for norovirus, on enteric precautions. SLP evaluated patient for dysphagia, patient is now on dysphagia 1 diet.    OBJECTIVE:  Vital Signs: Vitals:   11/08/22 1600 11/08/22 2032 11/09/22 0506 11/09/22 0745  BP: (!) 176/101 (!) 147/85 117/74 (!) 154/81  Pulse: 84 77 74 81  Resp:  '18 17 16  '$ Temp: 98.3 F (36.8 C) 98 F (36.7 C) (!) 97.4 F (36.3 C) 98.4 F (36.9 C)  TempSrc: Axillary  Oral Oral  SpO2: 100% 100% 98% 98%  Weight:        SpO2: 98 % on RA  Filed Weights   11/07/22 1203  Weight: 59.8 kg     Intake/Output Summary (Last 24 hours) at 11/09/2022 1029 Last data filed at 11/08/2022 2030 Gross per 24 hour  Intake --  Output 900 ml  Net -900 ml   Net IO Since Admission: -412.53 mL [11/09/22 1029]  Physical Exam:   Physical Exam Constitutional:      General: She is not in acute distress.    Appearance: She is not diaphoretic.  HENT:     Mouth/Throat:     Mouth: Mucous membranes are dry.  Cardiovascular:     Rate and Rhythm: Normal rate and regular rhythm.     Heart sounds:  Normal heart sounds.  Pulmonary:     Effort: Pulmonary effort is normal. No respiratory distress.     Breath sounds: Normal breath sounds.  Abdominal:     General: Bowel sounds are normal.     Palpations: Abdomen is soft.     Tenderness: There is no abdominal tenderness.  Musculoskeletal:        General: No swelling.     Right lower leg: No edema.     Left lower leg: No edema.  Skin:    General: Skin is warm and dry.     Comments: Skin turgor decreased, >2 seconds  Neurological:     Comments: Alert to self but not to place, situation, or time. Not following most commands. Moving all extremities spontaneously.      Patient Lines/Drains/Airways Status     Active Line/Drains/Airways     Name Placement date Placement time Site Days   Peripheral IV 11/08/22 22 G 1" Anterior;Left Forearm 11/08/22  1522  Forearm  1            Pertinent Labs:    Latest Ref Rng & Units 11/06/2022   12:42 AM 11/05/2022   12:06 PM 06/24/2022    3:29 PM  CBC  WBC 4.0 - 10.5 K/uL 7.5  8.0  8.7  Hemoglobin 12.0 - 15.0 g/dL 14.1  14.7  12.4   Hematocrit 36.0 - 46.0 % 42.2  45.6  38.4   Platelets 150 - 400 K/uL 257  261  281        Latest Ref Rng & Units 11/09/2022    2:54 AM 11/07/2022    4:46 AM 11/06/2022    7:30 AM  CMP  Glucose 70 - 99 mg/dL 151  131    BUN 8 - 23 mg/dL 12  9    Creatinine 0.44 - 1.00 mg/dL 0.66  0.72    Sodium 135 - 145 mmol/L 134  136    Potassium 3.5 - 5.1 mmol/L 3.7  3.4    Chloride 98 - 111 mmol/L 97  100    CO2 22 - 32 mmol/L 22  23    Calcium 8.9 - 10.3 mg/dL 9.4  8.8    Total Protein 6.5 - 8.1 g/dL 7.5  6.5    Total Bilirubin 0.3 - 1.2 mg/dL 2.3  1.6  1.7   Alkaline Phos 38 - 126 U/L 40  36    AST 15 - 41 U/L 34  21    ALT 0 - 44 U/L 16  19      Recent Labs    11/07/22 0845 11/07/22 1828 11/09/22 0828  GLUCAP 137* 170* 189*     Pertinent Imaging:   ASSESSMENT/PLAN:  Assessment: Principal Problem:   Urinary tract infection   Maria [name] is  a 89 y.o. with pertinent PMH of HTN, HLD, T2DM, CVA (10/2019), complete heart block (s/p pacemaker placement 2003 with generator change 04/2019), HFpEF (Echo 09/2016 EF 50-55% with G1DD), breast cancer (2015), OA, GERD, Alzheimer Dementia, and HSV who presented with altered mental status and admitted for UTI, on hospital day 4  Plan: ##Altered Mental Status #UTI #Alzheimer Dementia UA concerning for UTI on admission, urine culture positive for Pseudomonas. Transitioned form ceftriaxone on admission to cefepime for 5 days, currently on day 3 of therapy. No crackles on exam. Continues to be satting well on RA. Remains afebrile. Will continue treating UTI w/ cefepime. Patient seems to be dehydrated from decreased skin turgor, little urine output, and inadequate PO intake. We will give her a slower bolus and fluids for rehydration. - Continue IV cefepime 2g -Delirium precautions - Dysphagia 1 diet --Start LR bolus 536m over two hours --Start LR maintenance fluid 1248mhr over 8 hours   #Hx of Complete Heart Block (s/p pacemaker 2003) #HFpEF (Echo 09/2016 EF 50-55% w/ G1DD) Repeat CXR on 2/25 demonstrates improving bilateral perihilar/left basilar opacities. On exam, no crackles or LE noted. We will monitor for signs of fluid overload.  - Continue strict I's and O's  #HTN Intermittently hypertensive. Renal function stable. Will continue home losartan 50 mg daily. Will continue to monitor. -Continue Losartan 50 mg daily   #Diarrhea 7x loose stools overnight per nursing. No abdominal pain on exam. Positive for norovirus.  -Supportive care   #T2DM A1c 6.0% 5 months ago. [facility] mg BID [facility] CBGs remain in the low-mid 100s.  -Consider starting SSI once patient eating more   #Hyperbilirubinemia #Hx of Acute Cholecystitis  S/p cholecystectomy. RUQ [place] abnormal findings. Remains afebrile. T. Bili improved since starting abx and now stable. Will  continue to monitor.  -Trend CMP   #Hx of CVA (10/2019) #HLD On Aspirin 81 mg daily and Rosuvastatin 5 mg daily at home. -Continue aspirin 81  mg daily -Continue rosuvastatin 5 mg daily   #OA On Tylenol 650 mg (2X BID) and Meloxicam 7.5 mg daily PRN at home.  -Tylenol 650 mg q6 PRN   #GERD Taking Pantoprazole 20 mg daily at home.  -Consider restarting pantoprazole as necessary   #Hx of breast cancer (2015) Hx of right breast benign lumpectomy. Mammogram from 10/2013 demonstrates no evidence of malignancy.   #HSV On valacyclovir 1000 mg PRN at home. No complaints of flare at this time. Will hold for now.    #Dispo Recurrent hospitalizations for AMS. Has help from son/daughter/HH PT at home. Will likely need SNF at discharge. Given that she is currently positive for norovirus, we will contact SNF to see if they can accept her. -Appreciate TOC help   Diet: Dysphagia 1 Bowel: mirilax, senna VTE: lovenox IVF: none Code: DNR PT/OT recs: SNF Family Contact: son and daughter, to be notified. DISPO: Anticipated discharge in 2 days to Skilled nursing facility pending IV antibiotics and norovirus infection .   Signature: Marisa Cyphers, Medical Student   Please contact the on call pager after 5 pm and on weekends at [phone].  Attestation for Student Documentation:  I personally was present and performed or re-performed the history, physical exam and medical decision-making activities of this service and have verified that the service and findings are accurately documented in the student's note.  Starlyn Skeans, MD 11/09/2022, 2:54 PM

## 2022-11-09 NOTE — Progress Notes (Signed)
1300: Pt with very minimal urinary output, bladder scan showed only 180. Pt has needed a lot of encouragement to have an intake of fluids.  MD made aware of findings, LR bolus and continuous fluids ordered.

## 2022-11-10 DIAGNOSIS — R4182 Altered mental status, unspecified: Secondary | ICD-10-CM | POA: Diagnosis not present

## 2022-11-10 DIAGNOSIS — G309 Alzheimer's disease, unspecified: Secondary | ICD-10-CM | POA: Diagnosis not present

## 2022-11-10 DIAGNOSIS — N390 Urinary tract infection, site not specified: Secondary | ICD-10-CM | POA: Diagnosis not present

## 2022-11-10 LAB — CBC WITH DIFFERENTIAL/PLATELET
Abs Immature Granulocytes: 0.02 10*3/uL (ref 0.00–0.07)
Basophils Absolute: 0 10*3/uL (ref 0.0–0.1)
Basophils Relative: 0 %
Eosinophils Absolute: 0.1 10*3/uL (ref 0.0–0.5)
Eosinophils Relative: 2 %
HCT: 38.3 % (ref 36.0–46.0)
Hemoglobin: 12.6 g/dL (ref 12.0–15.0)
Immature Granulocytes: 0 %
Lymphocytes Relative: 26 %
Lymphs Abs: 2.2 10*3/uL (ref 0.7–4.0)
MCH: 28 pg (ref 26.0–34.0)
MCHC: 32.9 g/dL (ref 30.0–36.0)
MCV: 85.1 fL (ref 80.0–100.0)
Monocytes Absolute: 0.9 10*3/uL (ref 0.1–1.0)
Monocytes Relative: 11 %
Neutro Abs: 5.1 10*3/uL (ref 1.7–7.7)
Neutrophils Relative %: 61 %
Platelets: 231 10*3/uL (ref 150–400)
RBC: 4.5 MIL/uL (ref 3.87–5.11)
RDW: 13.5 % (ref 11.5–15.5)
WBC: 8.3 10*3/uL (ref 4.0–10.5)
nRBC: 0 % (ref 0.0–0.2)

## 2022-11-10 LAB — COMPREHENSIVE METABOLIC PANEL
ALT: 18 U/L (ref 0–44)
AST: 21 U/L (ref 15–41)
Albumin: 3.2 g/dL — ABNORMAL LOW (ref 3.5–5.0)
Alkaline Phosphatase: 39 U/L (ref 38–126)
Anion gap: 11 (ref 5–15)
BUN: 16 mg/dL (ref 8–23)
CO2: 25 mmol/L (ref 22–32)
Calcium: 9.1 mg/dL (ref 8.9–10.3)
Chloride: 101 mmol/L (ref 98–111)
Creatinine, Ser: 0.69 mg/dL (ref 0.44–1.00)
GFR, Estimated: >60 mL/min (ref >60)
Glucose, Bld: 137 mg/dL — ABNORMAL HIGH (ref 70–99)
Potassium: 3.4 mmol/L — ABNORMAL LOW (ref 3.5–5.1)
Sodium: 137 mmol/L (ref 135–145)
Total Bilirubin: 1.4 mg/dL — ABNORMAL HIGH (ref 0.3–1.2)
Total Protein: 6.4 g/dL — ABNORMAL LOW (ref 6.5–8.1)

## 2022-11-10 LAB — GLUCOSE, CAPILLARY: Glucose-Capillary: 145 mg/dL — ABNORMAL HIGH (ref 70–99)

## 2022-11-10 MED ORDER — POTASSIUM CHLORIDE 20 MEQ PO PACK
40.0000 meq | PACK | Freq: Two times a day (BID) | ORAL | Status: AC
  Administered 2022-11-10 (×2): 40 meq via ORAL
  Filled 2022-11-10 (×2): qty 2

## 2022-11-10 MED ORDER — LACTATED RINGERS IV BOLUS
500.0000 mL | Freq: Once | INTRAVENOUS | Status: AC
  Administered 2022-11-10: 500 mL via INTRAVENOUS

## 2022-11-10 NOTE — Progress Notes (Signed)
Speech Language Pathology Treatment: Dysphagia  Patient Details Name: Maria [name] MRN: [id number] DOB: 12/03/1923 Today's Date: 11/10/2022 Time: [id number] SLP Time Calculation (min) (ACUTE ONLY): 17 min  Assessment / Plan / Recommendation Clinical Impression  Provided patient with trials of advanced texture solid pos. She was able to consume dysphagia 3 solids with max assist for feeding with mildly prolonged but functional mastication of bolus. Mild oral residuals cleared with extra time and liquid wash. No overt s/s of aspiration observed with liquids (thin liquids via straw) or solids.  Diet will be advanced to dysphagia 3 (some missing dentition) and thin liquids. No further SLP needs indicated at this time.    HPI HPI: Patient s a 89 y.o. female with PMH: Alzheimer's dementia, HTN, HLD, DM-2, CVA (2021), complete heart block s/p pacemaker 2003, breast cancer, OA, GERD. She presented to the hospital on 11/05/22 with AMS. She lives at home with daughter. In ED, CT head negative for acute changes, CXR significant for cardiomegaly, bilateral perihilar opacities possibly reflecting edema versus infection. UA concerning for UTI. Patient failed [place] with nursing and SLP swallow evaluation ordered with subsequent diet of Dysphagia 1/nectar-thickened liquids ordered.  ST f/u for diet progression.      SLP Plan  All goals met;Discharge SLP treatment due to (comment)      Recommendations for follow up therapy are one component of a multi-disciplinary discharge planning process, led by the attending physician.  Recommendations may be updated based on patient status, additional functional criteria and insurance authorization.    Recommendations  Diet recommendations: Dysphagia 3 (mechanical soft);Thin liquid Liquids provided via: Straw;Cup Medication Administration: Whole meds with puree (given AMS) Supervision: Trained caregiver to feed patient;Staff to assist with self feeding Compensations:  Slow rate;Small sips/bites;Minimize environmental distractions;Other (Comment) Postural Changes and/or Swallow Maneuvers: Seated upright 90 degrees                Oral Care Recommendations: Oral care BID;Staff/trained caregiver to provide oral care Follow Up Recommendations: No SLP follow up SLP Visit Diagnosis: Dysphagia, oropharyngeal phase (R13.12) Plan: All goals met;Discharge SLP treatment due to (comment)          Gabriel Rainwater MA, CCC-SLP  Mattison Stuckey Meryl  11/10/2022, 12:02 PM

## 2022-11-10 NOTE — Progress Notes (Addendum)
HD#5 SUBJECTIVE:  Patient Summary: Maria [name] is a 89 y.o. with a pertinent PMH of HTN, HLD, T2DM, CVA (10/2019), complete heart block (s/p pacemaker placement 2003 with generator change 04/2019), HFpEF (Echo 09/2016 EF 50-55% with G1DD), breast cancer (2015), OA, GERD, Alzheimer Dementia, and HSV who presented with altered mental status and admitted for a UTI.   Overnight Events: Per nursing, urinary output has been minimal and bladder scan showed only 123m. Patient needs a lot of encouragement to continue fluid intake. Visited patient at bedside and patient's confusion seems to have improved from yesterday. Patient can answer her full name, date of birth, and where she is. She is more interactive and stated that she typically does not wet the bed, but she has been doing so. Reminded patient that she is in the hospital for a UTI and offered her water. Patient was able to drink a couple of sips.    Interm History: LR bolus and continuous LR infusion ordered yesterday for concerns of dehydration. Started supervised feeds to promote better nutrition. SLP visited patient and patient advanced to dysphagia level 3 diet.  OBJECTIVE:  Vital Signs: Vitals:   11/09/22 1942 11/10/22 0419 11/10/22 0749 11/10/22 1004  BP: 130/76 (!) 148/110 137/83 127/63  Pulse: 69 77 73 73  Resp:   16   Temp: 98.2 F (36.8 C) 98 F (36.7 C) 97.7 F (36.5 C)   TempSrc: Oral     SpO2: 94% 100% 100%   Weight:       Supplemental O2: Room Air SpO2: 100 %  Filed Weights   11/07/22 1203  Weight: 59.8 kg     Intake/Output Summary (Last 24 hours) at 11/10/2022 1403 Last data filed at 11/10/2022 1022 Gross per 24 hour  Intake 354 ml  Output 1200 ml  Net -846 ml   Net IO Since Admission: -1,128.53 mL [11/10/22 1403]  Physical Exam: Physical Exam Constitutional:      General: She is not in acute distress.    Appearance: She is not diaphoretic.  HENT:     Mouth/Throat:     Mouth: Mucous membranes are  dry.  Cardiovascular:     Rate and Rhythm: Normal rate and regular rhythm.     Heart sounds: Normal heart sounds.  Pulmonary:     Effort: Pulmonary effort is normal.     Breath sounds: Normal breath sounds.  Abdominal:     General: Abdomen is flat. Bowel sounds are normal.     Palpations: Abdomen is soft.  Skin:    General: Skin is warm and dry.  Neurological:     Comments: Alert and oriented to her name and location  Psychiatric:        Mood and Affect: Mood normal.        Behavior: Behavior normal.     Patient Lines/Drains/Airways Status     Active Line/Drains/Airways     Name Placement date Placement time Site Days   Peripheral IV 11/08/22 22 G 1" Anterior;Left Forearm 11/08/22  1522  Forearm  2   External Urinary Catheter 11/09/22  1000  --  1            Pertinent Labs:    Latest Ref Rng & Units 11/10/2022    4:32 AM 11/06/2022   12:42 AM 11/05/2022   12:06 PM  CBC  WBC 4.0 - 10.5 K/uL 8.3  7.5  8.0   Hemoglobin 12.0 - 15.0 g/dL 12.6  14.1  14.7  Hematocrit 36.0 - 46.0 % 38.3  42.2  45.6   Platelets 150 - 400 K/uL 231  257  261        Latest Ref Rng & Units 11/10/2022    4:32 AM 11/09/2022    2:54 AM 11/07/2022    4:46 AM  CMP  Glucose 70 - 99 mg/dL 137  151  131   BUN 8 - 23 mg/dL '16  12  9   '$ Creatinine 0.44 - 1.00 mg/dL 0.69  0.66  0.72   Sodium 135 - 145 mmol/L 137  134  136   Potassium 3.5 - 5.1 mmol/L 3.4  3.7  3.4   Chloride 98 - 111 mmol/L 101  97  100   CO2 22 - 32 mmol/L '25  22  23   '$ Calcium 8.9 - 10.3 mg/dL 9.1  9.4  8.8   Total Protein 6.5 - 8.1 g/dL 6.4  7.5  6.5   Total Bilirubin 0.3 - 1.2 mg/dL 1.4  2.3  1.6   Alkaline Phos 38 - 126 U/L 39  40  36   AST 15 - 41 U/L 21  34  21   ALT 0 - 44 U/L '18  16  19     '$ Recent Labs    11/09/22 1547 11/09/22 2134 11/10/22 0417  GLUCAP 162* 173* 145*     Pertinent Imaging: No results found.  ASSESSMENT/PLAN:  Assessment: Principal Problem:   Urinary tract infection   Maria [name] is  a 89 y.o. with pertinent PMH of HTN, HLD, T2DM, CVA (10/2019), complete heart block (s/p pacemaker placement 2003 with generator change 04/2019), HFpEF (Echo 09/2016 EF 50-55% with G1DD), breast cancer (2015), OA, GERD, Alzheimer Dementia, and HSV who presented with altered mental status and admitted for a UTI. On hospital day 5.  Plan: ##Altered Mental Status #UTI #Alzheimer Dementia UA concerning for UTI on admission, urine culture positive for Pseudomonas. Patient is on cefepime for 5 days, currently on day 4 of therapy. No crackles on exam. Continues to be satting well on RA. Remains afebrile. Will continue treating UTI w/ cefepime. Patient seems to be dehydrated from decreased skin turgor, little urine output, and inadequate PO intake. We will give her another bolus for rehydration. - Continue IV cefepime 2g -Delirium precautions - Dysphagia 3 diet --Start LR bolus 589m over two hours --continue to encourage fluid and food intake, supervised feeds   #Hx of Complete Heart Block (s/p pacemaker 2003) #HFpEF (Echo 09/2016 EF 50-55% w/ G1DD) On exam, no crackles or LE noted. We will monitor for signs of fluid overload.  - Continue strict I's and O's   #HTN Intermittently hypertensive. Renal function stable from yesterday. Will continue home losartan 50 mg daily. Will continue to monitor. -Continue Losartan 50 mg daily   #Diarrhea No abdominal pain on exam. Positive for norovirus.  -Supportive care   #T2DM A1c 6.0% 5 months ago. On glimepiride 1 mg BID and metformin 1000 mg daily at home. CBGs remain in the low-mid 100s.  -Consider starting SSI once patient eating more   #Hyperbilirubinemia #Hx of Acute Cholecystitis  S/p cholecystectomy. Remains afebrile. T. Bili improved since starting abx and now stable. Will continue to monitor.  -Trend CMP   #Hx of CVA (10/2019) #HLD On Aspirin 81 mg daily and Rosuvastatin 5 mg daily at home. -Continue aspirin 81 mg daily -Continue  rosuvastatin 5 mg daily   #OA On Tylenol 650 mg (2X BID) and Meloxicam 7.5 mg daily  PRN at home.  -Tylenol 650 mg q6 PRN   #GERD Taking Pantoprazole 20 mg daily at home.  -Consider restarting pantoprazole as necessary   #Hx of breast cancer (2015) Hx of right breast benign lumpectomy. Mammogram from 10/2013 demonstrates no evidence of malignancy.   #HSV On valacyclovir 1000 mg PRN at home. No complaints of flare at this time. Will hold for now.    #Dispo Recurrent hospitalizations for AMS. Has help from son/daughter/HH PT at home. Will likely need SNF at discharge. Case management notified patient's daughter that patient will go to [facility] skilled nursing facility as they currently have availability. She will discharge to SNF once medically stable.  -Appreciate CM help   Best Practice: Diet: dysphagia 3 IVF: Fluids: LR, Rate:  500 cc bolus VTE: enoxaparin (LOVENOX) injection 40 mg Start: 11/05/22 2200 Code: DNR Therapy Recs: SNF, DME:  Family Contact: son and daughter, to be notified. DISPO: Anticipated discharge tomorrow to Skilled nursing facility pending IV antibiotics completion.   Signature: Marisa Cyphers, Medical Student   Please contact the on call pager after 5 pm and on weekends at [phone].

## 2022-11-10 NOTE — Discharge Summary (Incomplete)
Name: Maria [name] MRN: [id number] DOB: [birth year] 89 y.o. PCP: Audley Hose, MD  Date of Admission: 11/05/2022 11:29 AM Date of Discharge: 11/11/2022 Attending Physician: Charise Killian, MD  Discharge Diagnosis: 1. Principal Problem:   Urinary tract infection Active Problems:   Alzheimer Dementia   History of Complete Heart Block (s/p pacemaker 2003)   HFpEF   Hypertension   Hyperlipidemia   Hyperbilirubinemia   History of CVA (10/2019)   History of breast cancer   Osteoarthritis   GERD   HSV ***  Discharge Medications: Allergies as of 11/10/2022       Reactions   Vancomycin Itching     Med Rec must be completed prior to using this [facility]***       Disposition and follow-up:   Maria [name] was discharged from [facility] in Good condition.  At the hospital follow up visit please address:  1.  ***  A. Altered Mental Status in the setting of UTI  -   B. Alzheimer Dementia  -  2.  Labs / imaging needed at time of follow-up: CBC  3.  Pending labs/ test needing follow-up: none  Follow-up Appointments: - Please follow up with your primary care provider Audley Hose, MD at the [facility] (please call to schedule an appointment) Phone: [phone] Address: [phone] [place], [place], [place] [address]  Hospital Course by problem list: ***  Maria [name] is a 89 y/o female with past medical medical history of HTN, HLD, T2DM, CVA (10/2019), complete heart block (s/p pacemaker placement 2003 with generator change 04/2019), HFpEF (Echo 09/2016 EF 50-55% with G1DD), breast cancer (2015), OA, GERD,Alzheimer Dementia, and HSV that presents for altered mental status, found to have UTI.       #UTI #Altered Mental Status #Alzheimer Dementia Discharged in 05/2022 after being admitted for acute metabolic encephalopathy suspected 2/2 UTI vs pneumonia, treated w/ abx. Presented with worsening confusion and  hallucinations. Ethanol level, UDS, ammonia WNL. Initial lactic acid elevated at 2.9. CT head negative for acute findings, significant for microvascular ischemic change and cerebral volume loss. Low suspicion for stroke at this time. UA concerning for UTI. Daughter reports history of urinary retention. Suspect that meclizine could be contributing to this history. No leukocytosis. Afebrile. Hypertensive. Received Ceftriaxone 1g IV and 1L NS bolus while in the ED. Suspect UTI is likely the cause of her AMS however DDX includes worsening Alzheimer dementia. Taking Donepezil 10 mg daily at home. Will continue treating with IV abx and reassess. Patient failed bedside swallow, currently NPO. Appears dehydrated on exam. Plan for IV fluids once hypertension improves. -IV Ceftriaxone 1g daily  -Pending bladder scan -Pending urine culture -Pending repeat lactic acid -Pending blood cultures -NPO -PT/OT -Delirium precautions   UA concerning for UTI on admission. CXR w/ edema vs infiltrate on admission. Urine culture positive for Pseudomonas. Transitioned form ceftriaxone to cefepime. Doxycyline was started for atypical coverage due to concern for possible pneumonia. Repeat CXR on 2/25 demonstrates improving bilateral perihilar/left basilar opacities. No crackles on exam. Continues to be satting well on RA. Remains afebrile. Lower suspicion for pneumonia at this time. Will discontinue doxycycline after today for this reason. Will continue treating UTI w/ cefepime.  - Continue IV cefepime 2g - Last day of IV Doxycycline 100 mg q12 hours -Delirium precautions - Dysphagia 1 diet   #Hx of Complete Heart Block (s/p pacemaker 2003) #HFpEF (Echo 09/2016 EF 50-55% w/ G1DD) Has  history of HFpEF and complete heart block s/p pacemaker. PT/aPTT WNL. Troponin mildly elevated at 34 -> 42 on admission. EKG demonstrating atrial-sensed, ventricular-paced complexes, relatively unchanged from prior. QTC prolonged at 631.  Low  suspicion for ACS at this time. BNP elevated at 290. CXR significant for cardiomegaly, bilateral perihilar opacities possibly reflecting edema versus infection. No lower extremity edema or crackles on exam. Satting well on RA. Low suspicion for heart failure exacerbation at this time. Will continue to monitor fluid status. -Mg level given prolonged QTC -Strict Is/Os   #HTN Taking losartan 50 mg daily at home.  Patient did not receive her medications today due to altered mental status and hospitalization.  Hypertensive in the 180s-190s on admission.  Would restart home meds but currently NPO. -IV Labetalol 5 mg q2 hours PRN for Systolic BP > 0000000   [id number] A1c 6.0% 5 months ago. Home medications include glimepiride 1 mg BID and metformin 1000 mg daily. CBGs in the 100s on admission.  Will hold home medications at this time given NPO.  -Consider starting SSI once patient is tolerating PO   #Hyperbilirubinemia #Hx of Acute Cholecystitis  Patient has history of acute cholecystitis in 2018 involving percutaneous drain placement.  Was noted to have elevated bilirubin at this time.  Patient was deemed to be too high risk for laparoscopic cholecystectomy, therefore this was not pursued.  Was treated with IV antibiotics.  Plan was initially to pursue MRCP, however the patient did not have this procedure done due to pacemaker in place. CMP on admission significant for elevated bilirubin of 1.5, appears stable over the last 5 months.  No complaints of abdominal pain at this time.  Remains afebrile.  Will reassess for abdominal pain as patients mentation improves. -Trend CMP   #Hx of CVA (10/2019) #HLD Lipid panel from 3 years ago with total cholesterol 242, triglyceride 105, HDL 33, and LDL 178. Taking Aspirin 81 mg daily and Rosuvastatin 5 mg daily at home. Will hold home medications at this time given NPO.  - Consider restarting aspirin and rosuvastatin once tolerating PO   #OA Home medications include  Tylenol 650 mg (2X BID) and Meloxicam 7.5 mg daily PRN.  Currently NPO at this time. -Reconsider Tylenol PRN as mentation improves   #GERD Taking Pantoprazole 20 mg daily at home. Currently NPO at this time. -Will restart home pantoprazole once tolerating PO   #Hx of breast cancer (2015) Has history of right breast benign lumpectomy.  Mammogram from 10/2013 demonstrates no evidence of malignancy.   #HSV Taking valacyclovir 1000 mg PRN at home. Will hold for now given NPO.   #Hypokalema - resolved   Discharge Exam:   BP 124/67 (BP Location: Right Arm)   Pulse 64   Temp 97.7 F (36.5 C) (Oral)   Resp 16   Wt 59.8 kg   SpO2 98%   BMI 22.63 kg/m  *** Constitutional:      General: She is not in acute distress.    Appearance: She is not diaphoretic.  HENT:     Mouth/Throat:     Mouth: Mucous membranes are dry.  Cardiovascular:     Rate and Rhythm: Normal rate and regular rhythm.     Heart sounds: Normal heart sounds.  Pulmonary:     Effort: Pulmonary effort is normal.     Breath sounds: Normal breath sounds.  Abdominal:     General: Abdomen is flat. Bowel sounds are normal.     Palpations: Abdomen is soft.  Skin:    General: Skin is warm and dry.  Neurological:     Comments: Alert and oriented to her name and location  Psychiatric:        Mood and Affect: Mood normal.        Behavior: Behavior normal.   Pertinent Labs, Studies, and Procedures:     Latest Ref Rng & Units 11/10/2022    4:32 AM 11/06/2022   12:42 AM 11/05/2022   12:06 PM  CBC  WBC 4.0 - 10.5 K/uL 8.3  7.5  8.0   Hemoglobin 12.0 - 15.0 g/dL 12.6  14.1  14.7   Hematocrit 36.0 - 46.0 % 38.3  42.2  45.6   Platelets 150 - 400 K/uL 231  257  261        Latest Ref Rng & Units 11/10/2022    4:32 AM 11/09/2022    2:54 AM 11/07/2022    4:46 AM  BMP  Glucose 70 - 99 mg/dL 137  151  131   BUN 8 - 23 mg/dL '16  12  9   '$ Creatinine 0.44 - 1.00 mg/dL 0.69  0.66  0.72   Sodium 135 - 145 mmol/L 137  134  136    Potassium 3.5 - 5.1 mmol/L 3.4  3.7  3.4   Chloride 98 - 111 mmol/L 101  97  100   CO2 22 - 32 mmol/L '25  22  23   '$ Calcium 8.9 - 10.3 mg/dL 9.1  9.4  8.8     DG Chest Port 1 View  IMPRESSION: 1. Shallow inspiration radiograph. 2. Cardiomegaly. 3. Bilateral perihilar opacities, which may reflect edema or infection. 4. Chronic elevation of the right hemidiaphragm. 5. Aortic Atherosclerosis (ICD10-I70.0).   On: 11/05/2022 13:05  CT HEAD WO CONTRAST  IMPRESSION: 1. No acute intracranial findings. 2. Chronic microvascular ischemic change and cerebral volume loss.   On: 11/05/2022 13:39  US Abdomen Limited RUQ  IMPRESSION: 1. No acute findings to account for the patient's symptoms. 2. Status post cholecystectomy.   On: 11/06/2022 11:05  DG CHEST PORT 1 VIEW  IMPRESSION: Improving bilateral perihilar and left basilar opacities.   On: 11/07/2022 13:29  Discharge Instructions:  *** You were hospitalized for altered mental status.   Hospital Course: We believe your altered mental status was due to a urinary tract infection. We treated you with antibiotics and your mental status improved. You will go to a skilled nursing facility for a few weeks after leaving the hospital to get stronger before going home.      Follow-up: - Please follow up with your primary care provider Audley Hose, MD at the [facility] (please call to schedule an appointment) Phone: [phone] Address: [phone] [place], [place], [place] [address]  Signed: Starlyn Skeans, MD 11/10/2022, 4:28 PM   Pager: [phone]

## 2022-11-10 NOTE — Progress Notes (Signed)
Patient dinner did not arrive. I called the cafe and it should be up shortly. Will ask night shift to feed her if it comes before I leave. Will continue to monitor patient.

## 2022-11-10 NOTE — TOC Progression Note (Signed)
Transition of Care [facility]) - Progression Note    Patient Details  Name: Maria [name] MRN: [id number] Date of Birth: 1923/11/04  Transition of Care [facility]) CM/SW Contact  Jinger Neighbors, [place] Phone Number: 11/10/2022, 12:28 PM  Clinical Narrative:     CSW received information from MD about positive test for Norovirus. CSW contacted Star at [facility] place to inquire about policies for viruses and admissions. Star stated pt needs to be symptom free for two days; MD confirmed no symptoms the last two days. They will finished her IV antibiotic and will plan for d/c tomorrow.   Expected Discharge Plan: [place] Barriers to Discharge: Continued Medical Work up  Expected Discharge Plan and Services       Living arrangements for the past 2 months: Single Family Home                                       Social Determinants of Health (SDOH) Interventions SDOH Screenings   Food Insecurity: No Food Insecurity (11/06/2022)  Housing: Low Risk  (07/26/2022)  Transportation Needs: No Transportation Needs (11/06/2022)  Utilities: Not At Risk (11/06/2022)  Financial Resource Strain: Low Risk  (08/02/2022)  Tobacco Use: High Risk (11/05/2022)    Readmission Risk Interventions     No data to display

## 2022-11-10 NOTE — Progress Notes (Signed)
Occupational Therapy Treatment Patient Details Name: Maria [name] MRN: [id number] DOB: Mar 20, 1924 Today's Date: 11/10/2022   History of present illness Patient is a 89 y/o female who presents on 2/23 with shoulder pain, leaning to the left and AMS. Found to have UTI. Discharged 05/2022 after being admitted for acute metabolic encephalopathy suspected 2/2 UTI vs pneumonia. PMH includes DM, HTN, severe pulmonary HTN, chronic respiratory failure, complete heart lock s/p PPM, breast ca 2015, macular degeneration, dementia, CVA, HF.   OT comments  Pt making slow progression towards goals this session, needing max A for bed mobility, able to sit EOB x5 min with min A support as pt leaning to L side and posteriorly. Pt needing hand over hand (max A ) to wash face in supine position and performed AROM/AAROM of BUE while supine in bed. Pt perseverating on not wanting to be left in the dark, and wanting someone to "stay with her all night". Pt presenting with impairments listed below, will follow acutely. Recommend SNF at d/c.   Recommendations for follow up therapy are one component of a multi-disciplinary discharge planning process, led by the attending physician.  Recommendations may be updated based on patient status, additional functional criteria and insurance authorization.    Follow Up Recommendations  Skilled nursing-short term rehab (<3 hours/day)     Assistance Recommended at Discharge Frequent or constant Supervision/Assistance  Patient can return home with the following  Two people to help with walking and/or transfers;A lot of help with bathing/dressing/bathroom;Assistance with cooking/housework;Direct supervision/assist for medications management;Direct supervision/assist for financial management;Assist for transportation;Help with stairs or ramp for entrance   Equipment Recommendations  Other (comment) (defer)    Recommendations for Other Services PT consult    Precautions /  Restrictions Precautions Precautions: Fall Restrictions Weight Bearing Restrictions: No       Mobility Bed Mobility Overal bed mobility: Needs Assistance Bed Mobility: Supine to Sit, Sit to Supine     Supine to sit: Max assist Sit to supine: Max assist   General bed mobility comments: use of pad to bring BLE's into and out of bed    Transfers                   General transfer comment: deferred for safety     Balance Overall balance assessment: Needs assistance Sitting-balance support: Bilateral upper extremity supported, Feet supported Sitting balance-Leahy Scale: Poor Sitting balance - Comments: pt leaning to L side (toward HOB) and posteriorly, needing min A support at all times Postural control: Left lateral lean, Posterior lean                                 ADL either performed or assessed with clinical judgement   ADL Overall ADL's : Needs assistance/impaired     Grooming: Maximal assistance;Bed level Grooming Details (indicate cue type and reason): hand over hand assist to wash face             Lower Body Dressing: Total assistance;Bed level Lower Body Dressing Details (indicate cue type and reason): to don socks             Functional mobility during ADLs: Maximal assistance      Extremity/Trunk Assessment Upper Extremity Assessment Upper Extremity Assessment: Generalized weakness (L >R weakness, reports shoulder pain)   Lower Extremity Assessment Lower Extremity Assessment: Defer to PT evaluation        Vision   Vision  Assessment?: No apparent visual deficits   Perception Perception Perception: Not tested   Praxis Praxis Praxis: Not tested    Cognition Arousal/Alertness: Awake/alert Behavior During Therapy: Flat affect Overall Cognitive Status: No family/caregiver present to determine baseline cognitive functioning Area of Impairment: Orientation, Attention, Memory, Following commands, Safety/judgement,  Awareness, Problem solving                 Orientation Level: Disoriented to, Time, Situation (states "hospital" to place) Current Attention Level: Sustained Memory: Decreased short-term memory Following Commands: Follows one step commands inconsistently, Follows one step commands with increased time Safety/Judgement: Decreased awareness of deficits, Decreased awareness of safety Awareness: Intellectual Problem Solving: Slow processing, Decreased initiation, Difficulty sequencing, Requires verbal cues, Requires tactile cues General Comments: drowsy initially, anxious with mobility and perseverative on "not wanting to be in the dark" after therapist opened pt's blinds        Exercises Exercises: General Upper Extremity General Exercises - Upper Extremity Shoulder Flexion: AROM, AAROM, Both, Supine    Shoulder Instructions       General Comments VSS on RA    Pertinent Vitals/ Pain       Pain Assessment Pain Assessment: Faces Pain Score: 4  Faces Pain Scale: Hurts little more Pain Location: knee with returning to bed Pain Descriptors / Indicators: Discomfort Pain Intervention(s): Limited activity within patient's tolerance, Monitored during session, Repositioned  Home Living                                          Prior Functioning/Environment              Frequency  Min 2X/week        Progress Toward Goals  OT Goals(current goals can now be found in the care plan section)  Progress towards OT goals: Progressing toward goals  Acute Rehab OT Goals Patient Stated Goal: none stated OT Goal Formulation: With patient Time For Goal Achievement: 11/20/22 Potential to Achieve Goals: Good ADL Goals Pt Will Perform Upper Body Dressing: with min assist;sitting Pt Will Perform Lower Body Dressing: with min assist;sitting/lateral leans;sit to/from stand Pt Will Transfer to Toilet: with min assist;ambulating;regular height toilet Additional ADL  Goal #1: pt will be min A for bed mobility in prep for ADLs Additional ADL Goal #2: pt will follow 2 step command in prep for ADLs  Plan Discharge plan remains appropriate;Frequency remains appropriate    Co-evaluation                 AM-PAC OT "6 Clicks" Daily Activity     Outcome Measure   Help from another person eating meals?: A Lot Help from another person taking care of personal grooming?: A Lot Help from another person toileting, which includes using toliet, bedpan, or urinal?: Total Help from another person bathing (including washing, rinsing, drying)?: A Lot Help from another person to put on and taking off regular upper body clothing?: A Lot Help from another person to put on and taking off regular lower body clothing?: Total 6 Click Score: 10    End of Session    OT Visit Diagnosis: Unsteadiness on feet (R26.81);Other abnormalities of gait and mobility (R26.89);Muscle weakness (generalized) (M62.81)   Activity Tolerance Patient limited by fatigue   Patient Left in bed;with call bell/phone within reach;with bed alarm set   Nurse Communication Mobility status  Time: [id number] OT Time Calculation (min): 27 min  Charges: OT General Charges $OT Visit: 1 Visit OT Treatments $Self Care/Home Management : 8-22 mins $Therapeutic Exercise: 8-22 mins  Renaye Rakers, OTD, OTR/L SecureChat Preferred Acute Rehab [phone]   Ulla Gallo 11/10/2022, 2:27 PM

## 2022-11-10 NOTE — Discharge Instructions (Addendum)
You were hospitalized for altered mental status.  Hospital Course: We believe your altered mental status was due to a urinary tract infection. We treated you with antibiotics and your mental status improved. You will go to a skilled nursing facility for a few weeks after leaving the hospital to get stronger before going home.   Medications:  - Please start taking metformin 500 mg with the following schedule:  1000 mg (0.5 tablet) daily with breakfast for 7 days (week 1)  Followed by:   1000 mg (1 tablet) with breakfast daily (week 2 and after)   Follow-up: - Please follow up with your primary care provider Audley Hose, MD at the [facility] (please call to schedule an appointment) Phone: [phone] Address: [phone], [place], [place], [place] [address]

## 2022-11-11 DIAGNOSIS — I152 Hypertension secondary to endocrine disorders: Secondary | ICD-10-CM | POA: Diagnosis not present

## 2022-11-11 DIAGNOSIS — I21A1 Myocardial infarction type 2: Secondary | ICD-10-CM | POA: Diagnosis present

## 2022-11-11 DIAGNOSIS — Z7984 Long term (current) use of oral hypoglycemic drugs: Secondary | ICD-10-CM | POA: Diagnosis not present

## 2022-11-11 DIAGNOSIS — R278 Other lack of coordination: Secondary | ICD-10-CM | POA: Diagnosis not present

## 2022-11-11 DIAGNOSIS — R41841 Cognitive communication deficit: Secondary | ICD-10-CM | POA: Diagnosis not present

## 2022-11-11 DIAGNOSIS — E440 Moderate protein-calorie malnutrition: Secondary | ICD-10-CM | POA: Diagnosis not present

## 2022-11-11 DIAGNOSIS — R2689 Other abnormalities of gait and mobility: Secondary | ICD-10-CM | POA: Diagnosis not present

## 2022-11-11 DIAGNOSIS — M6259 Muscle wasting and atrophy, not elsewhere classified, multiple sites: Secondary | ICD-10-CM | POA: Diagnosis not present

## 2022-11-11 DIAGNOSIS — Z66 Do not resuscitate: Secondary | ICD-10-CM | POA: Diagnosis present

## 2022-11-11 DIAGNOSIS — K5901 Slow transit constipation: Secondary | ICD-10-CM | POA: Diagnosis not present

## 2022-11-11 DIAGNOSIS — S4991XA Unspecified injury of right shoulder and upper arm, initial encounter: Secondary | ICD-10-CM | POA: Diagnosis not present

## 2022-11-11 DIAGNOSIS — N390 Urinary tract infection, site not specified: Secondary | ICD-10-CM | POA: Diagnosis not present

## 2022-11-11 DIAGNOSIS — R52 Pain, unspecified: Secondary | ICD-10-CM | POA: Diagnosis not present

## 2022-11-11 DIAGNOSIS — R531 Weakness: Secondary | ICD-10-CM | POA: Diagnosis not present

## 2022-11-11 DIAGNOSIS — R5383 Other fatigue: Secondary | ICD-10-CM | POA: Diagnosis not present

## 2022-11-11 DIAGNOSIS — I214 Non-ST elevation (NSTEMI) myocardial infarction: Secondary | ICD-10-CM | POA: Diagnosis not present

## 2022-11-11 DIAGNOSIS — Y92129 Unspecified place in nursing home as the place of occurrence of the external cause: Secondary | ICD-10-CM | POA: Diagnosis not present

## 2022-11-11 DIAGNOSIS — R5381 Other malaise: Secondary | ICD-10-CM | POA: Diagnosis not present

## 2022-11-11 DIAGNOSIS — Z8673 Personal history of transient ischemic attack (TIA), and cerebral infarction without residual deficits: Secondary | ICD-10-CM | POA: Diagnosis not present

## 2022-11-11 DIAGNOSIS — I10 Essential (primary) hypertension: Secondary | ICD-10-CM | POA: Diagnosis not present

## 2022-11-11 DIAGNOSIS — G309 Alzheimer's disease, unspecified: Secondary | ICD-10-CM | POA: Diagnosis not present

## 2022-11-11 DIAGNOSIS — Z8049 Family history of malignant neoplasm of other genital organs: Secondary | ICD-10-CM | POA: Diagnosis not present

## 2022-11-11 DIAGNOSIS — Z87440 Personal history of urinary (tract) infections: Secondary | ICD-10-CM | POA: Diagnosis not present

## 2022-11-11 DIAGNOSIS — M79609 Pain in unspecified limb: Secondary | ICD-10-CM | POA: Diagnosis not present

## 2022-11-11 DIAGNOSIS — I110 Hypertensive heart disease with heart failure: Secondary | ICD-10-CM | POA: Diagnosis not present

## 2022-11-11 DIAGNOSIS — Z8719 Personal history of other diseases of the digestive system: Secondary | ICD-10-CM | POA: Diagnosis not present

## 2022-11-11 DIAGNOSIS — Z79899 Other long term (current) drug therapy: Secondary | ICD-10-CM | POA: Diagnosis not present

## 2022-11-11 DIAGNOSIS — I442 Atrioventricular block, complete: Secondary | ICD-10-CM | POA: Diagnosis present

## 2022-11-11 DIAGNOSIS — M19011 Primary osteoarthritis, right shoulder: Secondary | ICD-10-CM | POA: Diagnosis not present

## 2022-11-11 DIAGNOSIS — F02B0 Dementia in other diseases classified elsewhere, moderate, without behavioral disturbance, psychotic disturbance, mood disturbance, and anxiety: Secondary | ICD-10-CM | POA: Diagnosis not present

## 2022-11-11 DIAGNOSIS — L309 Dermatitis, unspecified: Secondary | ICD-10-CM | POA: Diagnosis not present

## 2022-11-11 DIAGNOSIS — R410 Disorientation, unspecified: Secondary | ICD-10-CM | POA: Diagnosis not present

## 2022-11-11 DIAGNOSIS — G9341 Metabolic encephalopathy: Secondary | ICD-10-CM | POA: Diagnosis not present

## 2022-11-11 DIAGNOSIS — E785 Hyperlipidemia, unspecified: Secondary | ICD-10-CM | POA: Diagnosis present

## 2022-11-11 DIAGNOSIS — R2681 Unsteadiness on feet: Secondary | ICD-10-CM | POA: Diagnosis not present

## 2022-11-11 DIAGNOSIS — R4182 Altered mental status, unspecified: Secondary | ICD-10-CM | POA: Diagnosis not present

## 2022-11-11 DIAGNOSIS — Z950 Presence of cardiac pacemaker: Secondary | ICD-10-CM | POA: Diagnosis not present

## 2022-11-11 DIAGNOSIS — E860 Dehydration: Secondary | ICD-10-CM | POA: Diagnosis present

## 2022-11-11 DIAGNOSIS — S0990XA Unspecified injury of head, initial encounter: Secondary | ICD-10-CM | POA: Diagnosis not present

## 2022-11-11 DIAGNOSIS — Z8619 Personal history of other infectious and parasitic diseases: Secondary | ICD-10-CM | POA: Diagnosis not present

## 2022-11-11 DIAGNOSIS — R079 Chest pain, unspecified: Secondary | ICD-10-CM | POA: Diagnosis not present

## 2022-11-11 DIAGNOSIS — Z9189 Other specified personal risk factors, not elsewhere classified: Secondary | ICD-10-CM | POA: Diagnosis not present

## 2022-11-11 DIAGNOSIS — M47812 Spondylosis without myelopathy or radiculopathy, cervical region: Secondary | ICD-10-CM | POA: Diagnosis present

## 2022-11-11 DIAGNOSIS — Z8249 Family history of ischemic heart disease and other diseases of the circulatory system: Secondary | ICD-10-CM | POA: Diagnosis not present

## 2022-11-11 DIAGNOSIS — W19XXXA Unspecified fall, initial encounter: Secondary | ICD-10-CM | POA: Diagnosis not present

## 2022-11-11 DIAGNOSIS — K219 Gastro-esophageal reflux disease without esophagitis: Secondary | ICD-10-CM | POA: Diagnosis present

## 2022-11-11 DIAGNOSIS — I272 Pulmonary hypertension, unspecified: Secondary | ICD-10-CM | POA: Diagnosis not present

## 2022-11-11 DIAGNOSIS — I959 Hypotension, unspecified: Secondary | ICD-10-CM | POA: Diagnosis not present

## 2022-11-11 DIAGNOSIS — E119 Type 2 diabetes mellitus without complications: Secondary | ICD-10-CM | POA: Diagnosis not present

## 2022-11-11 DIAGNOSIS — R9431 Abnormal electrocardiogram [ECG] [EKG]: Secondary | ICD-10-CM | POA: Diagnosis not present

## 2022-11-11 DIAGNOSIS — Z7982 Long term (current) use of aspirin: Secondary | ICD-10-CM | POA: Diagnosis not present

## 2022-11-11 DIAGNOSIS — Z8679 Personal history of other diseases of the circulatory system: Secondary | ICD-10-CM | POA: Diagnosis not present

## 2022-11-11 DIAGNOSIS — Z7189 Other specified counseling: Secondary | ICD-10-CM | POA: Diagnosis not present

## 2022-11-11 DIAGNOSIS — F02C0 Dementia in other diseases classified elsewhere, severe, without behavioral disturbance, psychotic disturbance, mood disturbance, and anxiety: Secondary | ICD-10-CM | POA: Diagnosis not present

## 2022-11-11 DIAGNOSIS — R296 Repeated falls: Secondary | ICD-10-CM | POA: Diagnosis not present

## 2022-11-11 DIAGNOSIS — B009 Herpesviral infection, unspecified: Secondary | ICD-10-CM | POA: Diagnosis not present

## 2022-11-11 DIAGNOSIS — I951 Orthostatic hypotension: Secondary | ICD-10-CM | POA: Diagnosis not present

## 2022-11-11 DIAGNOSIS — S199XXA Unspecified injury of neck, initial encounter: Secondary | ICD-10-CM | POA: Diagnosis not present

## 2022-11-11 DIAGNOSIS — E1159 Type 2 diabetes mellitus with other circulatory complications: Secondary | ICD-10-CM | POA: Diagnosis not present

## 2022-11-11 DIAGNOSIS — Z7401 Bed confinement status: Secondary | ICD-10-CM | POA: Diagnosis not present

## 2022-11-11 DIAGNOSIS — F0280 Dementia in other diseases classified elsewhere without behavioral disturbance: Secondary | ICD-10-CM | POA: Diagnosis not present

## 2022-11-11 DIAGNOSIS — G8929 Other chronic pain: Secondary | ICD-10-CM | POA: Diagnosis present

## 2022-11-11 DIAGNOSIS — L89153 Pressure ulcer of sacral region, stage 3: Secondary | ICD-10-CM | POA: Diagnosis not present

## 2022-11-11 DIAGNOSIS — Z96653 Presence of artificial knee joint, bilateral: Secondary | ICD-10-CM | POA: Diagnosis present

## 2022-11-11 DIAGNOSIS — R1110 Vomiting, unspecified: Secondary | ICD-10-CM | POA: Diagnosis not present

## 2022-11-11 DIAGNOSIS — R1312 Dysphagia, oropharyngeal phase: Secondary | ICD-10-CM | POA: Diagnosis not present

## 2022-11-11 DIAGNOSIS — F29 Unspecified psychosis not due to a substance or known physiological condition: Secondary | ICD-10-CM | POA: Diagnosis not present

## 2022-11-11 DIAGNOSIS — M1990 Unspecified osteoarthritis, unspecified site: Secondary | ICD-10-CM | POA: Diagnosis not present

## 2022-11-11 DIAGNOSIS — Z9181 History of falling: Secondary | ICD-10-CM | POA: Diagnosis not present

## 2022-11-11 DIAGNOSIS — R778 Other specified abnormalities of plasma proteins: Secondary | ICD-10-CM | POA: Diagnosis not present

## 2022-11-11 DIAGNOSIS — I5032 Chronic diastolic (congestive) heart failure: Secondary | ICD-10-CM | POA: Diagnosis not present

## 2022-11-11 DIAGNOSIS — G479 Sleep disorder, unspecified: Secondary | ICD-10-CM | POA: Diagnosis not present

## 2022-11-11 DIAGNOSIS — M4802 Spinal stenosis, cervical region: Secondary | ICD-10-CM | POA: Diagnosis present

## 2022-11-11 DIAGNOSIS — M6281 Muscle weakness (generalized): Secondary | ICD-10-CM | POA: Diagnosis not present

## 2022-11-11 DIAGNOSIS — B351 Tinea unguium: Secondary | ICD-10-CM | POA: Diagnosis not present

## 2022-11-11 DIAGNOSIS — R110 Nausea: Secondary | ICD-10-CM | POA: Diagnosis not present

## 2022-11-11 DIAGNOSIS — Z853 Personal history of malignant neoplasm of breast: Secondary | ICD-10-CM | POA: Diagnosis not present

## 2022-11-11 LAB — BASIC METABOLIC PANEL
Anion gap: 8 (ref 5–15)
BUN: 9 mg/dL (ref 8–23)
CO2: 24 mmol/L (ref 22–32)
Calcium: 8.9 mg/dL (ref 8.9–10.3)
Chloride: 105 mmol/L (ref 98–111)
Creatinine, Ser: 0.61 mg/dL (ref 0.44–1.00)
GFR, Estimated: >60 mL/min (ref >60)
Glucose, Bld: 130 mg/dL — ABNORMAL HIGH (ref 70–99)
Potassium: 4.1 mmol/L (ref 3.5–5.1)
Sodium: 137 mmol/L (ref 135–145)

## 2022-11-11 LAB — CULTURE, BLOOD (ROUTINE X 2)
Culture: NO GROWTH
Culture: NO GROWTH
Special Requests: ADEQUATE
Special Requests: ADEQUATE

## 2022-11-11 MED ORDER — METFORMIN HCL 1000 MG PO TABS: ORAL_TABLET | ORAL | 0 refills | Status: AC

## 2022-11-11 NOTE — Progress Notes (Signed)
PT Cancellation Note  Patient Details Name: Maria [name] MRN: [id number] DOB: 07-Aug-1924   Cancelled Treatment:    Reason Eval/Treat Not Completed: Other (comment) leaving for SNF today per social work team, triage per dept policy.   Deniece Ree PT DPT PN2

## 2022-11-11 NOTE — Discharge Summary (Signed)
Name: Maria [name] MRN: [id number] DOB: [birth year] 89 y.o. PCP: Audley Hose, MD  Date of Admission: 11/05/2022 11:29 AM Date of Discharge: 11/11/2022 Attending Physician: Charise Killian, MD  Discharge Diagnosis: 1. Principal Problem:   Urinary tract infection Active Problems:   Alzheimer Dementia   History of Complete Heart Block (s/p pacemaker 2003)   HFpEF   Hypertension   Hyperlipidemia   Hyperbilirubinemia   History of CVA (10/2019)   History of breast cancer   Osteoarthritis   GERD   HSV  Discharge Medications: Allergies as of 11/11/2022       Reactions   Vancomycin Itching        Medication List     STOP taking these medications    glimepiride 1 MG tablet Commonly known as: AMARYL   meclizine 25 MG tablet Commonly known as: ANTIVERT       TAKE these medications    acetaminophen 650 MG CR tablet Commonly known as: TYLENOL Take 1,300 mg by mouth in the morning and at bedtime.   aspirin EC 81 MG tablet Take 81 mg by mouth daily.   donepezil 10 MG tablet Commonly known as: ARICEPT Take 10 mg by mouth at bedtime.   latanoprost 0.005 % ophthalmic solution Commonly known as: XALATAN Place 1 drop into both eyes at bedtime.   losartan 50 MG tablet Commonly known as: COZAAR Take 50 mg by mouth daily.   meloxicam 7.5 MG tablet Commonly known as: MOBIC Take 7.5 mg by mouth daily.   metFORMIN 1000 MG tablet Commonly known as: GLUCOPHAGE Take 0.5 tablets (500 mg total) by mouth daily with breakfast for 7 days, THEN 1 tablet (1,000 mg total) daily with breakfast for 23 days. Continue taking 1 tablet (1000 mg daily) until you are reassessed by your primary care provider.. Start taking on: November 11, 2022 What changed: See the new instructions.   nitroGLYCERIN 0.4 MG SL tablet Commonly known as: NITROSTAT Place 0.4 mg under the tongue every 5 (five) minutes as needed for chest pain.   OPTIVE 0.5-0.9 % ophthalmic solution Generic drug:  carboxymethylcellul-glycerin Place 1 drop into both eyes daily.   Refresh Optive PF 0.5-0.9 % Soln Generic drug: Carboxymethylcell-Glycerin PF Place 1 drop into both eyes 2 (two) times daily as needed.   pantoprazole 20 MG tablet Commonly known as: PROTONIX Take 1 tablet (20 mg total) by mouth daily. What changed:  when to take this reasons to take this   PRESERVISION AREDS 2+MULTI VIT PO Take 1 capsule by mouth daily.   Restasis 0.05 % ophthalmic emulsion Generic drug: cycloSPORINE Place 1 drop into both eyes daily.   rosuvastatin 5 MG tablet Commonly known as: CRESTOR Take 5 mg by mouth daily.   trolamine salicylate 10 % cream Commonly known as: ASPERCREME Apply 1 Application topically as needed for muscle pain.   valACYclovir 1000 MG tablet Commonly known as: VALTREX Take 1,000 mg by mouth as needed (breakouts).        Disposition and follow-up:   Maria [name] was discharged from [facility] in Good condition.  At the hospital follow up visit please address:  1.    A. Altered Mental Status in the setting of UTI   - Treated with IV abx, mentation improved, no abx at discharge   B. Alzheimer Dementia   - Potentially contributing to initial altered mentation, discharge to SNF to build strength prior to going home  2.  Labs / imaging needed at  time of follow-up: CBC, CMP  3.  Pending labs/ test needing follow-up: none  Follow-up Appointments: - Please follow up with your primary care provider Audley Hose, MD at the [facility] (please call to schedule an appointment) Phone: [phone] Address: [phone] [address], [place], [place], [place] [address]  Hospital Course by problem list: Maria [name] is a 89 y/o female with past medical medical history of HTN, HLD, T2DM, CVA (10/2019), complete heart block (s/p pacemaker placement 2003 with generator change 04/2019), HFpEF (Echo 09/2016 EF 50-55% with G1DD), breast cancer  (2015), OA, GERD,Alzheimer Dementia, and HSV that presents for altered mental status, found to have UTI.     #UTI #Altered Mental Status #Alzheimer Dementia Patient was discharged in 05/2022 after being admitted for acute metabolic encephalopathy suspected 2/2 UTI vs pneumonia, treated w/ abx. Presented here again with worsening confusion and hallucinations. Ethanol level, UDS, ammonia WNL. Lactic acid was initially elevated but trended downward. CT head negative for acute findings, significant for microvascular ischemic change and cerebral volume loss. Had low suspicion for stroke. UA concerning for UTI on admission. CXR w/ edema vs infiltrate on admission. Initially started on IV ceftriaxone.Urine culture positive for Pseudomonas. Transitioned form ceftriaxone to cefepime to pseudomonas coverage. Doxycyline was started for atypical coverage due to concern for possible pneumonia (also due to prolonged Qtc). Repeat CXR on 2/25 demonstrates improving bilateral perihilar/left basilar opacities. Serial lung exams remained normal. Continued to be satting well on RA. Had lower suspicion for pneumonia after reassessment and therefore discontinued doxycycline. Treated UTI with 5 day course of cefepime. Remained afebrile without leukocytosis. Mentation improved to being alert to self and place but not time. Patient was assisted w/ feedings but ate well when food was offered to her. Continued her home donepezil at discharge. Patient will go to SNF to build her strength prior to going home.    #Hx of Complete Heart Block (s/p pacemaker 2003) #HFpEF (Echo 09/2016 EF 50-55% w/ G1DD) Has history of HFpEF and complete heart block s/p pacemaker. On admission, PT/aPTT WNL. Troponins were minimally elevated but flat. EKG demonstrating atrial-sensed, ventricular-paced complexes, relatively unchanged from prior. QTC prolonged at 631. BNP was elevated at 290, however, no signs of fluid overload on exam. CXR was significant for  cardiomegaly, bilateral perihilar opacities possibly reflecting edema versus infection. Repeat CXR demonstrated improvement of perihilar opacities. Patient remained euvolemic throughout her hospital stay without edema or crackles on lung exam. Suspect that perihilar opacities were not secondary to fluid overload given improvement without diuresis.    #HTN Was initially NPO on admission due to altered mentation. Home losartan 50 mg daily was held. Treated hypertension with as needed IV labetalol PRN. Once patient was able to tolerate PO, transitioned back to losartan. Discharged on home losartan dose.   #T2DM A1c 6.0% 5 months ago. Home medications included glimepiride 1 mg BID and metformin 1000 mg daily.  These medications were initially held due to NPO status. Blood glucose remained WNL without home medications or sliding scale insulin.  Discontinued glimepiride on discharge.  Provided instructions for restarting metformin at 500 mg daily for 1 week then increasing to 1000 mg daily thereafter to decrease risk of GI side effects.   #Hyperbilirubinemia #Hx of Acute Cholecystitis  Patient has history of acute cholecystitis in 2018 involving percutaneous drain placement.  Was noted to have elevated bilirubin at this time.  Patient was deemed to be too high risk for laparoscopic cholecystectomy, therefore this was not  pursued. Was treated with IV antibiotics at that time. Plan was initially to pursue MRCP, however the patient did not have this procedure done due to having pacemaker in place. CMP on admission significant for elevated bilirubin of 1.5 which was stable over the last 5 months.  No complaints of abdominal pain.  Remained afebrile without leukocytosis.  No signs of acute cholecystitis.  Bilirubin improved throughout her hospital course.  Recommend repeat CMP as outpatient.   #Hx of CVA (10/2019) #HLD Lipid panel from 3 years ago with total cholesterol 242, triglyceride 105, HDL 33, and LDL 178.  Home medications prior to admission included Aspirin 81 mg daily and Rosuvastatin 5 mg daily at home.  Initially held these medications due to NPO.  Once patient was able to tolerate PO, restarted home aspirin and rosuvastatin.  Discharged on home aspirin and rosuvastatin.   #OA Home medications prior to admission included Tylenol 650 mg (2X BID) and Meloxicam 7.5 mg daily PRN.  Continued these medications at discharge.  #GERD Was taking Pantoprazole 20 mg daily at home.  Continued this medication at discharge.   #Hx of breast cancer (2015) Has history of right breast benign lumpectomy. Mammogram from 10/2013 demonstrates no evidence of malignancy.   #HSV Was taking valacyclovir 1000 mg PRN at home. Initially held this medication due to NPO. Continued this medication at discharge.   #Hypokalema - resolved Supplemented.  Potassium normalized.   Discharge Exam:   BP 136/77 (BP Location: Right Arm)   Pulse 73   Temp 98.2 F (36.8 C) (Oral)   Resp 15   Wt 59.8 kg   SpO2 99%   BMI 22.63 kg/m  Constitutional: resting in bed, not in acute distress, responding to most questions appropriately Eyes: EOMI.   Cardiovascular: Regular rate, regular rhythm. No murmurs, rubs, or gallops. Normal radial and PT pulses bilaterally. No LE edema.  Pulmonary: Normal respiratory effort. No wheezes, rales, rhonchi, or crackles.   Abdominal: Soft. Non-distended. No tenderness. Normal bowel sounds.  Musculoskeletal: Normal range of motion.     Neurological: Oriented to person and place but not time. Non-focal. Skin: warm and dry.    Pertinent Labs, Studies, and Procedures:     Latest Ref Rng & Units 11/10/2022    4:32 AM 11/06/2022   12:42 AM 11/05/2022   12:06 PM  CBC  WBC 4.0 - 10.5 K/uL 8.3  7.5  8.0   Hemoglobin 12.0 - 15.0 g/dL 12.6  14.1  14.7   Hematocrit 36.0 - 46.0 % 38.3  42.2  45.6   Platelets 150 - 400 K/uL 231  257  261        Latest Ref Rng & Units 11/11/2022    6:26 AM 11/10/2022     4:32 AM 11/09/2022    2:54 AM  BMP  Glucose 70 - 99 mg/dL 130  137  151   BUN 8 - 23 mg/dL '9  16  12   '$ Creatinine 0.44 - 1.00 mg/dL 0.61  0.69  0.66   Sodium 135 - 145 mmol/L 137  137  134   Potassium 3.5 - 5.1 mmol/L 4.1  3.4  3.7   Chloride 98 - 111 mmol/L 105  101  97   CO2 22 - 32 mmol/L '24  25  22   '$ Calcium 8.9 - 10.3 mg/dL 8.9  9.1  9.4     DG Chest Port 1 View  IMPRESSION: 1. Shallow inspiration radiograph. 2. Cardiomegaly. 3. Bilateral perihilar opacities, which may  reflect edema or infection. 4. Chronic elevation of the right hemidiaphragm. 5. Aortic Atherosclerosis (ICD10-I70.0).   On: 11/05/2022 13:05   CT HEAD WO CONTRAST  IMPRESSION: 1. No acute intracranial findings. 2. Chronic microvascular ischemic change and cerebral volume loss.   On: 11/05/2022 13:39   US Abdomen Limited RUQ  IMPRESSION: 1. No acute findings to account for the patient's symptoms. 2. Status post cholecystectomy.   On: 11/06/2022 11:05   DG CHEST PORT 1 VIEW  IMPRESSION: Improving bilateral perihilar and left basilar opacities.   On: 11/07/2022 13:29   Discharge Instructions: Discharge Instructions     Call MD for:  difficulty breathing, headache or visual disturbances   Complete by: As directed    Call MD for:  extreme fatigue   Complete by: As directed    Call MD for:  persistant dizziness or light-headedness   Complete by: As directed    Call MD for:  persistant nausea and vomiting   Complete by: As directed    Call MD for:  temperature >100.4   Complete by: As directed    Diet - low sodium heart healthy   Complete by: As directed    Increase activity slowly   Complete by: As directed       You were hospitalized for altered mental status.   Hospital Course: We believe your altered mental status was due to a urinary tract infection. We treated you with antibiotics and your mental status improved. You will go to a skilled nursing facility for a few weeks after  leaving the hospital to get stronger before going home.   Medications:  - Please start taking metformin 500 mg with the following schedule:  1000 mg (0.5 tablet) daily with breakfast for 7 days (week 1)  Followed by:   1000 mg (1 tablet) with breakfast daily (week 2 and after)   Follow-up: - Please follow up with your primary care provider Audley Hose, MD at the [facility] (please call to schedule an appointment) Phone: [phone] Address: [phone] [place], [place], [place] [address]  Signed: Starlyn Skeans, MD 11/11/2022, 1:12 PM   Pager: [phone]

## 2022-11-11 NOTE — TOC Transition Note (Signed)
Transition of Care [facility]) - CM/SW Discharge Note   Patient Details  Name: Maria [name] MRN: [id number] Date of Birth: September 25, 1923  [facility] CM/SW Contact:  Jinger Neighbors, LCSW Phone Number: 11/11/2022, 1:40 PM   Clinical Narrative:     PT going to [facility] via [place].   Call to report: [phone]  Room #: [id number]  CSW called [name], pt's dtr to make her aware of d/c.   Final next level of care: Skilled Nursing Facility Barriers to Discharge: No Barriers Identified   Patient Goals and CMS Choice CMS Medicare.gov Compare Post Acute Care list provided to:: Patient Represenative (must comment) (pt's dtr) Choice offered to / list presented to : Adult Children  Discharge Placement                Patient chooses bed at: [facility] Patient to be transferred to facility by: [place] Name of family member notified: Gwenlyn Saran Daughter [phone] Patient and family notified of of transfer: 11/11/22  Discharge Plan and Services Additional resources added to the After Visit Summary for                                       Social Determinants of Health (SDOH) Interventions SDOH Screenings   Food Insecurity: No Food Insecurity (11/06/2022)  Housing: Low Risk  (07/26/2022)  Transportation Needs: No Transportation Needs (11/06/2022)  Utilities: Not At Risk (11/06/2022)  Financial Resource Strain: Low Risk  (08/02/2022)  Tobacco Use: High Risk (11/05/2022)     Readmission Risk Interventions     No data to display

## 2022-11-15 DIAGNOSIS — Z7984 Long term (current) use of oral hypoglycemic drugs: Secondary | ICD-10-CM | POA: Diagnosis not present

## 2022-11-15 DIAGNOSIS — L309 Dermatitis, unspecified: Secondary | ICD-10-CM | POA: Diagnosis not present

## 2022-11-15 DIAGNOSIS — F0280 Dementia in other diseases classified elsewhere without behavioral disturbance: Secondary | ICD-10-CM | POA: Diagnosis not present

## 2022-11-15 DIAGNOSIS — G309 Alzheimer's disease, unspecified: Secondary | ICD-10-CM | POA: Diagnosis not present

## 2022-11-15 DIAGNOSIS — M1990 Unspecified osteoarthritis, unspecified site: Secondary | ICD-10-CM | POA: Diagnosis not present

## 2022-11-15 DIAGNOSIS — E119 Type 2 diabetes mellitus without complications: Secondary | ICD-10-CM | POA: Diagnosis not present

## 2022-11-15 DIAGNOSIS — I10 Essential (primary) hypertension: Secondary | ICD-10-CM | POA: Diagnosis not present

## 2022-11-16 ENCOUNTER — Emergency Department (HOSPITAL_COMMUNITY): Payer: Medicare Other

## 2022-11-16 ENCOUNTER — Inpatient Hospital Stay (HOSPITAL_COMMUNITY)
Admission: EM | Admit: 2022-11-16 | Discharge: 2022-11-18 | DRG: 281 | Disposition: A | Discharge status: Skilled Nursing Facility | Payer: Medicare Other | Source: Skilled Nursing Facility | Attending: Internal Medicine | Admitting: Internal Medicine

## 2022-11-16 ENCOUNTER — Other Ambulatory Visit: Payer: Self-pay

## 2022-11-16 DIAGNOSIS — M4802 Spinal stenosis, cervical region: Secondary | ICD-10-CM | POA: Diagnosis present

## 2022-11-16 DIAGNOSIS — B009 Herpesviral infection, unspecified: Secondary | ICD-10-CM | POA: Diagnosis present

## 2022-11-16 DIAGNOSIS — E119 Type 2 diabetes mellitus without complications: Secondary | ICD-10-CM | POA: Diagnosis present

## 2022-11-16 DIAGNOSIS — E860 Dehydration: Secondary | ICD-10-CM | POA: Diagnosis present

## 2022-11-16 DIAGNOSIS — F419 Anxiety disorder, unspecified: Secondary | ICD-10-CM | POA: Diagnosis present

## 2022-11-16 DIAGNOSIS — Z96653 Presence of artificial knee joint, bilateral: Secondary | ICD-10-CM | POA: Diagnosis present

## 2022-11-16 DIAGNOSIS — Z950 Presence of cardiac pacemaker: Secondary | ICD-10-CM | POA: Diagnosis not present

## 2022-11-16 DIAGNOSIS — Z8673 Personal history of transient ischemic attack (TIA), and cerebral infarction without residual deficits: Secondary | ICD-10-CM

## 2022-11-16 DIAGNOSIS — K219 Gastro-esophageal reflux disease without esophagitis: Secondary | ICD-10-CM | POA: Diagnosis present

## 2022-11-16 DIAGNOSIS — Z66 Do not resuscitate: Secondary | ICD-10-CM | POA: Diagnosis present

## 2022-11-16 DIAGNOSIS — M25512 Pain in left shoulder: Secondary | ICD-10-CM | POA: Diagnosis present

## 2022-11-16 DIAGNOSIS — I442 Atrioventricular block, complete: Secondary | ICD-10-CM | POA: Diagnosis present

## 2022-11-16 DIAGNOSIS — W19XXXA Unspecified fall, initial encounter: Secondary | ICD-10-CM | POA: Diagnosis not present

## 2022-11-16 DIAGNOSIS — Z8249 Family history of ischemic heart disease and other diseases of the circulatory system: Secondary | ICD-10-CM | POA: Diagnosis not present

## 2022-11-16 DIAGNOSIS — I21A1 Myocardial infarction type 2: Secondary | ICD-10-CM | POA: Diagnosis present

## 2022-11-16 DIAGNOSIS — G309 Alzheimer's disease, unspecified: Secondary | ICD-10-CM | POA: Diagnosis present

## 2022-11-16 DIAGNOSIS — R531 Weakness: Secondary | ICD-10-CM

## 2022-11-16 DIAGNOSIS — Y92129 Unspecified place in nursing home as the place of occurrence of the external cause: Secondary | ICD-10-CM | POA: Diagnosis not present

## 2022-11-16 DIAGNOSIS — I110 Hypertensive heart disease with heart failure: Secondary | ICD-10-CM | POA: Diagnosis present

## 2022-11-16 DIAGNOSIS — I272 Pulmonary hypertension, unspecified: Secondary | ICD-10-CM | POA: Diagnosis present

## 2022-11-16 DIAGNOSIS — M47812 Spondylosis without myelopathy or radiculopathy, cervical region: Secondary | ICD-10-CM | POA: Diagnosis present

## 2022-11-16 DIAGNOSIS — I5032 Chronic diastolic (congestive) heart failure: Secondary | ICD-10-CM | POA: Diagnosis present

## 2022-11-16 DIAGNOSIS — E785 Hyperlipidemia, unspecified: Secondary | ICD-10-CM | POA: Diagnosis present

## 2022-11-16 DIAGNOSIS — I951 Orthostatic hypotension: Principal | ICD-10-CM

## 2022-11-16 DIAGNOSIS — Z853 Personal history of malignant neoplasm of breast: Secondary | ICD-10-CM

## 2022-11-16 DIAGNOSIS — Z7984 Long term (current) use of oral hypoglycemic drugs: Secondary | ICD-10-CM

## 2022-11-16 DIAGNOSIS — R1110 Vomiting, unspecified: Secondary | ICD-10-CM | POA: Diagnosis not present

## 2022-11-16 DIAGNOSIS — G8929 Other chronic pain: Secondary | ICD-10-CM | POA: Diagnosis present

## 2022-11-16 DIAGNOSIS — R778 Other specified abnormalities of plasma proteins: Secondary | ICD-10-CM | POA: Diagnosis not present

## 2022-11-16 DIAGNOSIS — S4991XA Unspecified injury of right shoulder and upper arm, initial encounter: Secondary | ICD-10-CM | POA: Diagnosis not present

## 2022-11-16 DIAGNOSIS — F29 Unspecified psychosis not due to a substance or known physiological condition: Secondary | ICD-10-CM | POA: Diagnosis not present

## 2022-11-16 DIAGNOSIS — M19011 Primary osteoarthritis, right shoulder: Secondary | ICD-10-CM | POA: Diagnosis not present

## 2022-11-16 DIAGNOSIS — S0990XA Unspecified injury of head, initial encounter: Secondary | ICD-10-CM | POA: Diagnosis not present

## 2022-11-16 DIAGNOSIS — F0280 Dementia in other diseases classified elsewhere without behavioral disturbance: Secondary | ICD-10-CM | POA: Diagnosis present

## 2022-11-16 DIAGNOSIS — Z881 Allergy status to other antibiotic agents status: Secondary | ICD-10-CM

## 2022-11-16 DIAGNOSIS — Z79899 Other long term (current) drug therapy: Secondary | ICD-10-CM | POA: Diagnosis not present

## 2022-11-16 DIAGNOSIS — Z9181 History of falling: Secondary | ICD-10-CM | POA: Diagnosis not present

## 2022-11-16 DIAGNOSIS — S199XXA Unspecified injury of neck, initial encounter: Secondary | ICD-10-CM | POA: Diagnosis not present

## 2022-11-16 DIAGNOSIS — I959 Hypotension, unspecified: Secondary | ICD-10-CM | POA: Diagnosis not present

## 2022-11-16 DIAGNOSIS — Z8049 Family history of malignant neoplasm of other genital organs: Secondary | ICD-10-CM | POA: Diagnosis not present

## 2022-11-16 DIAGNOSIS — R7989 Other specified abnormal findings of blood chemistry: Secondary | ICD-10-CM

## 2022-11-16 DIAGNOSIS — R5383 Other fatigue: Secondary | ICD-10-CM | POA: Diagnosis not present

## 2022-11-16 DIAGNOSIS — Z791 Long term (current) use of non-steroidal anti-inflammatories (NSAID): Secondary | ICD-10-CM

## 2022-11-16 DIAGNOSIS — Z7982 Long term (current) use of aspirin: Secondary | ICD-10-CM

## 2022-11-16 DIAGNOSIS — Z7401 Bed confinement status: Secondary | ICD-10-CM | POA: Diagnosis not present

## 2022-11-16 DIAGNOSIS — R079 Chest pain, unspecified: Secondary | ICD-10-CM | POA: Diagnosis not present

## 2022-11-16 DIAGNOSIS — E861 Hypovolemia: Secondary | ICD-10-CM | POA: Diagnosis present

## 2022-11-16 DIAGNOSIS — R4182 Altered mental status, unspecified: Secondary | ICD-10-CM | POA: Diagnosis not present

## 2022-11-16 DIAGNOSIS — R110 Nausea: Secondary | ICD-10-CM | POA: Diagnosis not present

## 2022-11-16 DIAGNOSIS — R410 Disorientation, unspecified: Secondary | ICD-10-CM | POA: Diagnosis not present

## 2022-11-16 LAB — COMPREHENSIVE METABOLIC PANEL
ALT: 23 U/L (ref 0–44)
AST: 32 U/L (ref 15–41)
Albumin: 4.4 g/dL (ref 3.5–5.0)
Alkaline Phosphatase: 45 U/L (ref 38–126)
Anion gap: 18 — ABNORMAL HIGH (ref 5–15)
BUN: 16 mg/dL (ref 8–23)
CO2: 22 mmol/L (ref 22–32)
Calcium: 9.7 mg/dL (ref 8.9–10.3)
Chloride: 102 mmol/L (ref 98–111)
Creatinine, Ser: 1 mg/dL (ref 0.44–1.00)
GFR, Estimated: 52 mL/min — ABNORMAL LOW (ref >60)
Glucose, Bld: 160 mg/dL — ABNORMAL HIGH (ref 70–99)
Potassium: 3.9 mmol/L (ref 3.5–5.1)
Sodium: 142 mmol/L (ref 135–145)
Total Bilirubin: 1.5 mg/dL — ABNORMAL HIGH (ref 0.3–1.2)
Total Protein: 8.3 g/dL — ABNORMAL HIGH (ref 6.5–8.1)

## 2022-11-16 LAB — CBC WITH DIFFERENTIAL/PLATELET
Abs Immature Granulocytes: 0.03 10*3/uL (ref 0.00–0.07)
Basophils Absolute: 0 10*3/uL (ref 0.0–0.1)
Basophils Relative: 0 %
Eosinophils Absolute: 0.1 10*3/uL (ref 0.0–0.5)
Eosinophils Relative: 1 %
HCT: 44.7 % (ref 36.0–46.0)
Hemoglobin: 14.7 g/dL (ref 12.0–15.0)
Immature Granulocytes: 0 %
Lymphocytes Relative: 24 %
Lymphs Abs: 1.9 10*3/uL (ref 0.7–4.0)
MCH: 28.3 pg (ref 26.0–34.0)
MCHC: 32.9 g/dL (ref 30.0–36.0)
MCV: 86.1 fL (ref 80.0–100.0)
Monocytes Absolute: 0.5 10*3/uL (ref 0.1–1.0)
Monocytes Relative: 6 %
Neutro Abs: 5.5 10*3/uL (ref 1.7–7.7)
Neutrophils Relative %: 69 %
Platelets: 294 10*3/uL (ref 150–400)
RBC: 5.19 MIL/uL — ABNORMAL HIGH (ref 3.87–5.11)
RDW: 13.4 % (ref 11.5–15.5)
WBC: 7.9 10*3/uL (ref 4.0–10.5)
nRBC: 0 % (ref 0.0–0.2)

## 2022-11-16 LAB — LIPASE, BLOOD: Lipase: 25 U/L (ref 11–51)

## 2022-11-16 LAB — TROPONIN I (HIGH SENSITIVITY)
Troponin I (High Sensitivity): 142 ng/L (ref <18)
Troponin I (High Sensitivity): 123 ng/L (ref <18)

## 2022-11-16 MED ORDER — ASPIRIN 81 MG PO CHEW
324.0000 mg | CHEWABLE_TABLET | Freq: Once | ORAL | Status: AC
  Administered 2022-11-16: 324 mg via ORAL
  Filled 2022-11-16: qty 4

## 2022-11-16 MED ORDER — ACETAMINOPHEN 650 MG RE SUPP: 650.0000 mg | Freq: Four times a day (QID) | RECTAL | Status: DC | PRN

## 2022-11-16 MED ORDER — ASPIRIN 81 MG PO TBEC
81.0000 mg | DELAYED_RELEASE_TABLET | Freq: Every day | ORAL | Status: DC
  Administered 2022-11-17 – 2022-11-18 (×2): 81 mg via ORAL
  Filled 2022-11-16 (×2): qty 1

## 2022-11-16 MED ORDER — ACETAMINOPHEN 325 MG PO TABS
650.0000 mg | ORAL_TABLET | Freq: Four times a day (QID) | ORAL | Status: DC | PRN
  Administered 2022-11-18: 650 mg via ORAL
  Filled 2022-11-16: qty 2

## 2022-11-16 MED ORDER — PANTOPRAZOLE SODIUM 20 MG PO TBEC
20.0000 mg | DELAYED_RELEASE_TABLET | Freq: Every day | ORAL | Status: DC
  Administered 2022-11-17 – 2022-11-18 (×2): 20 mg via ORAL
  Filled 2022-11-16 (×2): qty 1

## 2022-11-16 MED ORDER — LACTATED RINGERS IV BOLUS
500.0000 mL | Freq: Once | INTRAVENOUS | Status: AC
  Administered 2022-11-16: 500 mL via INTRAVENOUS

## 2022-11-16 MED ORDER — ROSUVASTATIN CALCIUM 5 MG PO TABS
5.0000 mg | ORAL_TABLET | Freq: Every day | ORAL | Status: DC
  Administered 2022-11-17: 5 mg via ORAL
  Filled 2022-11-16: qty 1

## 2022-11-16 NOTE — ED Provider Notes (Signed)
[facility] Provider Note   CSN: [id number] Arrival date & time: 11/16/22  1112     History  Chief Complaint  Patient presents with   Maria [name] is a 89 y.o. female.  Patient is a 89 year old female with a past medical history of dementia ANO x 1 at baseline, diabetes, CHF, complete heart block with pacemaker in place presenting to the emergency department from her nursing home after a fall.  Per EMS, the patient had a fall last night.  Unclear if she hit her head or lost consciousness.  Since then she has been appearing more fatigued than usual and complaining of right-sided shoulder pain.  She was also complaining of epigastric pain and had nausea and vomiting at the nursing home.  On arrival here, the patient complains of generalized body aches all over but denies any current nausea and denies any other complaints.  The history is provided by the EMS personnel. History limited by: Dementia.  Fall       Home Medications Prior to Admission medications   Medication Sig Start Date End Date Taking? Authorizing Provider  acetaminophen (TYLENOL) 650 MG CR tablet Take 1,300 mg by mouth in the morning and at bedtime.    [provider]  aspirin EC 81 MG tablet Take 81 mg by mouth daily.    [provider]  carboxymethylcellul-glycerin (OPTIVE) 0.5-0.9 % ophthalmic solution Place 1 drop into both eyes daily.    [provider]  cycloSPORINE (RESTASIS) 0.05 % ophthalmic emulsion Place 1 drop into both eyes daily.     [provider]  donepezil (ARICEPT) 10 MG tablet Take 10 mg by mouth at bedtime.    [provider]  latanoprost (XALATAN) 0.005 % ophthalmic solution Place 1 drop into both eyes at bedtime. 06/10/20   [provider]  losartan (COZAAR) 50 MG tablet Take 50 mg by mouth daily. 05/26/21   [provider]  meloxicam (MOBIC) 7.5 MG tablet Take 7.5 mg by mouth  daily.    [provider]  metFORMIN (GLUCOPHAGE) 1000 MG tablet Take 0.5 tablets (500 mg total) by mouth daily with breakfast for 7 days, THEN 1 tablet (1,000 mg total) daily with breakfast for 23 days. Continue taking 1 tablet (1000 mg daily) until you are reassessed by your primary care provider.. 11/11/22 12/11/22  Mapp, Claudia Desanctis, MD  Multiple Vitamins-Minerals (PRESERVISION AREDS 2+MULTI VIT PO) Take 1 capsule by mouth daily.    [provider]  nitroGLYCERIN (NITROSTAT) 0.4 MG SL tablet Place 0.4 mg under the tongue every 5 (five) minutes as needed for chest pain. 06/10/20   [provider]  pantoprazole (PROTONIX) 20 MG tablet Take 1 tablet (20 mg total) by mouth daily. Patient taking differently: Take 20 mg by mouth daily as needed for heartburn or indigestion. 06/24/22   Drenda Freeze, MD  REFRESH OPTIVE PF 0.5-0.9 % SOLN Place 1 drop into both eyes 2 (two) times daily as needed.    [provider]  rosuvastatin (CRESTOR) 5 MG tablet Take 5 mg by mouth daily. 11/19/19   [provider]  trolamine salicylate (ASPERCREME) 10 % cream Apply 1 Application topically as needed for muscle pain.    [provider]  valACYclovir (VALTREX) 1000 MG tablet Take 1,000 mg by mouth as needed (breakouts).    [provider]      Allergies    Vancomycin    Review of  Systems   Review of Systems  Physical Exam Updated Vital Signs BP (!) 137/57 (BP Location: Right Arm)   Pulse 68   Temp (!) 97.5 F (36.4 C) (Rectal)   Resp 16   SpO2 97%  Physical Exam Vitals and nursing note reviewed.  Constitutional:      General: She is not in acute distress.    Appearance: Normal appearance.  HENT:     Head: Normocephalic and atraumatic.     Nose: Nose normal.     Mouth/Throat:     Mouth: Mucous membranes are moist.     Pharynx: Oropharynx is clear.  Eyes:     Extraocular Movements: Extraocular movements intact.     Conjunctiva/sclera:  Conjunctivae normal.     Pupils: Pupils are equal, round, and reactive to light.  Neck:     Comments: No midline neck tenderness Cardiovascular:     Rate and Rhythm: Normal rate and regular rhythm.     Pulses: Normal pulses.     Heart sounds: Normal heart sounds.  Pulmonary:     Effort: Pulmonary effort is normal.     Breath sounds: Normal breath sounds.  Abdominal:     General: Abdomen is flat.     Palpations: Abdomen is soft.     Tenderness: There is no abdominal tenderness.  Musculoskeletal:        General: Normal range of motion.     Cervical back: Normal range of motion.     Comments: No midline back tenderness No bony tenderness to bilateral upper or lower extremities Pelvis stable, non-tender  Skin:    General: Skin is warm and dry.     Findings: No bruising.  Neurological:     General: No focal deficit present.     Mental Status: She is alert. Mental status is at baseline.     Sensory: No sensory deficit.     Motor: No weakness.  Psychiatric:        Mood and Affect: Mood normal.        Behavior: Behavior normal.     ED Results / Procedures / Treatments   Labs (all labs ordered are listed, but only abnormal results are displayed) Labs Reviewed  CBC WITH DIFFERENTIAL/PLATELET - Abnormal; Notable for the following components:      Result Value   RBC 5.19 (*)    All other components within normal limits  COMPREHENSIVE METABOLIC PANEL - Abnormal; Notable for the following components:   Glucose, Bld 160 (*)    Total Protein 8.3 (*)    Total Bilirubin 1.5 (*)    GFR, Estimated 52 (*)    Anion gap 18 (*)    All other components within normal limits  TROPONIN I (HIGH SENSITIVITY) - Abnormal; Notable for the following components:   Troponin I (High Sensitivity) 142 (*)    All other components within normal limits  URINALYSIS, ROUTINE W REFLEX MICROSCOPIC  LIPASE, BLOOD  TROPONIN I (HIGH SENSITIVITY)    EKG EKG Interpretation  Date/Time:  Tuesday November 16 2022 12:05:46 EST Ventricular Rate:  65 PR Interval:  197 QRS Duration: 128 QT Interval:  453 QTC Calculation: 471 R Axis:   -79 Text Interpretation: Sinus rhythm Nonspecific IVCD with LAD LVH with secondary repolarization abnormality Anterolateral infarct, age indeterminate No significant change since last tracing Confirmed by Leanord Asal ([id number]) on 11/16/2022 1:44:48 PM  Radiology DG Shoulder Right  Result Date: 11/16/2022 CLINICAL DATA:  Trauma, fall EXAM: RIGHT SHOULDER - 2+ VIEW  COMPARISON:  None Available. FINDINGS: No recent fracture or dislocation is seen. Degenerative changes are noted in right shoulder with large bony spurs. Degenerative changes are noted in right [facility] joint with joint space narrowing and bony spurs. Smooth marginated calcifications adjacent to the lateral end of right clavicle may be residual from previous injury. IMPRESSION: No recent fracture is seen. Degenerative changes are noted in right shoulder and right AC joints. Electronically Signed   By: Elmer Picker M.D.   On: 11/16/2022 13:51   DG Chest 1 View  Result Date: 11/16/2022 CLINICAL DATA:  Trauma, fall EXAM: CHEST  1 VIEW COMPARISON:  11/07/2022 FINDINGS: Transverse diameter of heart is increased. There are no signs of pulmonary edema. Right hemidiaphragm is elevated. There are no new infiltrates or signs of pulmonary edema. Left lateral CP angle is indistinct. There is no pneumothorax. Severe degenerative changes are noted in both shoulders and AC joints. IMPRESSION: There are no signs of pulmonary edema or focal pulmonary consolidation. Left lateral CP angle is indistinct which may suggest pleural thickening or minimal effusion. Electronically Signed   By: Elmer Picker M.D.   On: 11/16/2022 13:50   CT Head Wo Contrast  Result Date: 11/16/2022 CLINICAL DATA:  Fall.  Head and neck trauma. EXAM: CT HEAD WITHOUT CONTRAST CT CERVICAL SPINE WITHOUT CONTRAST TECHNIQUE: Multidetector CT imaging of the  head and cervical spine was performed following the standard protocol without intravenous contrast. Multiplanar CT image reconstructions of the cervical spine were also generated. RADIATION DOSE REDUCTION: This exam was performed according to the departmental dose-optimization program which includes automated exposure control, adjustment of the mA and/or kV according to patient size and/or use of iterative reconstruction technique. COMPARISON:  Head CT 11/05/2022.  Cervical spine CT 04/01/2020. FINDINGS: CT HEAD FINDINGS Brain: No acute hemorrhage. Unchanged chronic small-vessel disease. Cortical gray-white differentiation is otherwise preserved. Prominence of the ventricles and sulci within normal limits for age. No extra-axial collection. Basilar cisterns are patent. Vascular: No hyperdense vessel or unexpected calcification. Skull: No calvarial fracture or suspicious bone lesion. Skull base is unremarkable. Sinuses/Orbits: Unremarkable. Other: None. CT CERVICAL SPINE FINDINGS Alignment: Normal. Skull base and vertebrae: No acute fracture. Normal craniocervical junction. No suspicious bone lesions. Soft tissues and spinal canal: No prevertebral fluid or swelling. No visible canal hematoma. Disc levels: Unchanged multilevel cervical spondylosis with calcified disc osteophyte complexes causing at least moderate spinal canal stenosis at C5-6 and C6-7. Upper chest: Unremarkable. Other: Unchanged multinodular thyroid goiter, better evaluated on prior thyroid ultrasound. IMPRESSION: 1. No acute intracranial abnormality. 2. No acute cervical spine fracture or traumatic listhesis. 3. Unchanged multilevel cervical spondylosis with at least moderate spinal canal stenosis at C5-6 and C6-7. Electronically Signed   By: Emmit Alexanders M.D.   On: 11/16/2022 13:03   CT Cervical Spine Wo Contrast  Result Date: 11/16/2022 CLINICAL DATA:  Fall.  Head and neck trauma. EXAM: CT HEAD WITHOUT CONTRAST CT CERVICAL SPINE WITHOUT  CONTRAST TECHNIQUE: Multidetector CT imaging of the head and cervical spine was performed following the standard protocol without intravenous contrast. Multiplanar CT image reconstructions of the cervical spine were also generated. RADIATION DOSE REDUCTION: This exam was performed according to the departmental dose-optimization program which includes automated exposure control, adjustment of the mA and/or kV according to patient size and/or use of iterative reconstruction technique. COMPARISON:  Head CT 11/05/2022.  Cervical spine CT 04/01/2020. FINDINGS: CT HEAD FINDINGS Brain: No acute hemorrhage. Unchanged chronic small-vessel disease. Cortical gray-white differentiation is otherwise preserved. Prominence  of the ventricles and sulci within normal limits for age. No extra-axial collection. Basilar cisterns are patent. Vascular: No hyperdense vessel or unexpected calcification. Skull: No calvarial fracture or suspicious bone lesion. Skull base is unremarkable. Sinuses/Orbits: Unremarkable. Other: None. CT CERVICAL SPINE FINDINGS Alignment: Normal. Skull base and vertebrae: No acute fracture. Normal craniocervical junction. No suspicious bone lesions. Soft tissues and spinal canal: No prevertebral fluid or swelling. No visible canal hematoma. Disc levels: Unchanged multilevel cervical spondylosis with calcified disc osteophyte complexes causing at least moderate spinal canal stenosis at C5-6 and C6-7. Upper chest: Unremarkable. Other: Unchanged multinodular thyroid goiter, better evaluated on prior thyroid ultrasound. IMPRESSION: 1. No acute intracranial abnormality. 2. No acute cervical spine fracture or traumatic listhesis. 3. Unchanged multilevel cervical spondylosis with at least moderate spinal canal stenosis at C5-6 and C6-7. Electronically Signed   By: Emmit Alexanders M.D.   On: 11/16/2022 13:03    Procedures Procedures    Medications Ordered in ED Medications  aspirin chewable tablet 324 mg (324 mg  Oral Given 11/16/22 1423)    ED Course/ Medical Decision Making/ A&P Clinical Course as of 11/16/22 1435  Tue Nov 16, 2022  1407 Troponin elevated to 142, no ischemic changes on EKG. Cardiology will be consulted and patient will require admission. No traumatic injuries seen on CXR. [VK]  1431 Cardiology recommended no heparin at this time and hospitalist admission and they will evaluate the patient for further recommendations. [VK]    Clinical Course User Index [VK] Kemper Durie, DO                             Medical Decision Making This patient presents to the ED with chief complaint(s) of fall, fatigue with pertinent past medical history of dementia, DM which further complicates the presenting complaint. The complaint involves an extensive differential diagnosis and also carries with it a high risk of complications and morbidity.    The differential diagnosis includes due to patient's age and unwitnessed fall, concern for ICH, mass effect, cervical spine fracture, possible shoulder fracture, no other traumatic injuries seen on exam, considering ACS, arrhythmia, anemia, infection, electrolyte abnormality as causes of her vomiting and fatigue today  Additional history obtained: Additional history obtained from EMS  Records reviewed [place]  ED Course and Reassessment: On patient's arrival to the emergency department she is awake alert well-appearing in no acute distress.  EKG was performed on arrival that showed no acute ischemic changes.  Patient will have head CT and C-spine as well as shoulder x-ray to evaluate for traumatic injury and will have labs including troponin and urine to evaluate for cause of her fatigue and vomiting and she will be closely reassessed.  Independent labs interpretation:  The following labs were independently interpreted: elevated tropon 142, labs otherwise within normal range  Independent visualization of imaging: - I independently  visualized the following imaging with scope of interpretation limited to determining acute life threatening conditions related to emergency care: CTH/C-spine, R shoulder XR, CXR, which revealed no acute traumatic injury  Consultation: - Consulted or discussed management/test interpretation w/ external professional: cardiology, hospitalist  Consideration for admission or further workup: patient requires admission for further management of her cardiac ischemia and weakness Social Determinants of health: N/A    Amount and/or Complexity of Data Reviewed Labs: ordered. Radiology: ordered.  Risk OTC drugs.          Final Clinical Impression(s) / ED  Diagnoses Final diagnoses:  Elevated troponin  Fall, initial encounter  Generalized weakness    Rx / DC Orders ED Discharge Orders     None         Kemper Durie, DO 11/16/22 1435

## 2022-11-16 NOTE — Hospital Course (Addendum)
106 Dementai a ao1 Htn [facility] Heart block pacemaker Presented after fall at snf last night More weak and fatigued 1 episode vomiting at snf No complaints here Doesn't remmeber fall No trauma on workup Trops elevated 142 No ischemia EKG Other labs okay Urine pending Got 324 asa No heparin at this time Cards will see and add recs Admit elevated trops weakness and vomiting Icd interrogatino  Minimal effusion cxr  Fall - orthostatics - ICD - meds - she has an AKI - decreased GFR, baseline cr around 0.6-0.7 now 1.0. fall probably from dehydration. Likely no supervised feeds at SNF, poor PO intake in the setting of her dementia, probably not taking in what she needs on her own.  AMS - MIST -   Anion gap D-d   Presented from [facility] place, unwitnessed, was found on the ground by nursing staff, Daughter reports at baseline mentation, has chronic right shoulder pain, worsening mental status over the last 2 weeks,  Was not feeling sick this week. Was feeling well until she fell. She is not having any chest pain.  She says she got dizzy prior to fall.  She has decreased skin turgor. Has been drinking significantly less fluids at [place], not given typical amoutn that she usually consumes. She has aintained solid PO intake since daughter and assistant are able to feed her.  Daughter notes liquids less.  3/6 Oriented to person and place,

## 2022-11-16 NOTE — Progress Notes (Signed)
Pt roomed to unit at 1845hrs from ED after a mechanical fall at [facility] where she resides. Unable to obtain assessment answers at this time, pt not verbally responsive but responds to physical stimuli

## 2022-11-16 NOTE — ED Notes (Signed)
ED TO INPATIENT HANDOFF REPORT  ED Nurse Name and Phone #: Waynetta Sandy [phone]  S Name/Age/Gender Maria [name] 89 y.o. female Room/Bed: 045C/045C  Code Status   Code Status: DNR  Home/SNF/Other Home Patient oriented to: self Is this baseline? Yes   Triage Complete: Triage complete  Chief Complaint Fall [W19.XXXA]  Triage Note Per EMS pt had fall last night. Pt more lethargic and fatigued. Pt has shoulder pain on right. Epigastric pain. Vomited once this morning. AxOx1  70 HR 106/64 BP CBG 207 98% RA   Allergies Allergies  Allergen Reactions   Vancomycin Itching    Level of Care/Admitting Diagnosis ED Disposition     ED Disposition  Admit   Condition  --   Comment  Hospital Area: [place] [[id number]]  Level of Care: Telemetry Medical [104]  May place patient in observation at [facility] or [facility] if equivalent level of care is available:: Yes  Covid Evaluation: Asymptomatic - no recent exposure (last 10 days) testing not required  Diagnosis: Fall [290176]  Admitting Physician: Charise Killian [id number]  Attending Physician: Charise Killian [id number]          B Medical/Surgery History Past Medical History:  Diagnosis Date   Anxiety    Arthritis    Cancer ([place])    breast 2015   Cardiac pacemaker in situ    Chest pain    Dyslipidemia    Dyspnea    with exertion   Family history of adverse reaction to anesthesia    daughter slow to wake up   Gallstones    Headache    Hypertension    Macular degeneration    Positive RPR test 07/08/2022   Pulmonary hypertension ([place])    PA peak pressure 81 mmHg by 09/2016 echo   Type II or unspecified type diabetes mellitus without mention of complication, not stated as uncontrolled    Wears partial dentures    Past Surgical History:  Procedure Laterality Date   BREAST EXCISIONAL BIOPSY Right 2010   CATARACT EXTRACTION     CHOLECYSTECTOMY N/A 01/18/2017   Procedure: LAPAROSCOPIC  CHOLECYSTECTOMY;  Surgeon: Ralene Ok, MD;  Location: [facility];  Service: General;  Laterality: N/A;   IR GENERIC HISTORICAL  10/08/2016   IR PERC CHOLECYSTOSTOMY 10/08/2016 Greggory Keen, MD [facility]-INTER[facility] RAD   IR RADIOLOGIST EVAL & MGMT  11/16/2016   KNEE SURGERY Bilateral    MULTIPLE TOOTH EXTRACTIONS     PACEMAKER INSERTION     [employer], [facility] 2003   PACEMAKER INSERTION     PPM GENERATOR CHANGEOUT N/A 04/16/2019   Procedure: PPM GENERATOR CHANGEOUT;  Surgeon: Evans Lance, MD;  Location: [facility];  Service: Cardiovascular;  Laterality: N/A;   TOTAL KNEE ARTHROPLASTY Bilateral      A IV Location/Drains/Wounds Patient Lines/Drains/Airways Status     Active Line/Drains/Airways     Name Placement date Placement time Site Days   Peripheral IV 11/16/22 22 G 1" Anterior;Distal;Left Forearm 11/16/22  1655  Forearm  less than 1   External Urinary Catheter 11/09/22  1000  --  7            Intake/Output Last 24 hours No intake or output data in the 24 hours ending 11/16/22 1716  Labs/Imaging Results for orders placed or performed during the hospital encounter of 11/16/22 (from the past 48 hour(s))  CBC with Differential     Status: Abnormal   Collection Time: 11/16/22 12:46 PM  Result  Value Ref Range   WBC 7.9 4.0 - 10.5 K/uL   RBC 5.19 (H) 3.87 - 5.11 MIL/uL   Hemoglobin 14.7 12.0 - 15.0 g/dL   HCT 44.7 36.0 - 46.0 %   MCV 86.1 80.0 - 100.0 fL   MCH 28.3 26.0 - 34.0 pg   MCHC 32.9 30.0 - 36.0 g/dL   RDW 13.4 11.5 - 15.5 %   Platelets 294 150 - 400 K/uL    Comment: REPEATED TO VERIFY   nRBC 0.0 0.0 - 0.2 %   Neutrophils Relative % 69 %   Neutro Abs 5.5 1.7 - 7.7 K/uL   Lymphocytes Relative 24 %   Lymphs Abs 1.9 0.7 - 4.0 K/uL   Monocytes Relative 6 %   Monocytes Absolute 0.5 0.1 - 1.0 K/uL   Eosinophils Relative 1 %   Eosinophils Absolute 0.1 0.0 - 0.5 K/uL   Basophils Relative 0 %   Basophils Absolute 0.0 0.0 - 0.1 K/uL   Immature Granulocytes 0 %    Abs Immature Granulocytes 0.03 0.00 - 0.07 K/uL    Comment: Performed at [facility], [address] [place], [place] [address]  Troponin I (High Sensitivity)     Status: Abnormal   Collection Time: 11/16/22 12:46 PM  Result Value Ref Range   Troponin I (High Sensitivity) 142 (HH) <18 ng/L    Comment: CRITICAL RESULT CALLED TO, READ BACK BY AND VERIFIED WITH C.WILLIAMS RN 1403 11/16/22 MCCORMICK K (NOTE) Elevated high sensitivity troponin I (hsTnI) values and significant  changes across serial measurements may suggest ACS but many other  chronic and acute conditions are known to elevate hsTnI results.  Refer to the "Links" section for chest pain algorithms and additional  guidance. Performed at [facility], [place] [address], [place], [place] [address]   Comprehensive metabolic panel     Status: Abnormal   Collection Time: 11/16/22 12:46 PM  Result Value Ref Range   Sodium 142 135 - 145 mmol/L   Potassium 3.9 3.5 - 5.1 mmol/L   Chloride 102 98 - 111 mmol/L   CO2 22 22 - 32 mmol/L   Glucose, Bld 160 (H) 70 - 99 mg/dL    Comment: Glucose reference range applies only to samples taken after fasting for at least 8 hours.   BUN 16 8 - 23 mg/dL   Creatinine, Ser 1.00 0.44 - 1.00 mg/dL   Calcium 9.7 8.9 - 10.3 mg/dL   Total Protein 8.3 (H) 6.5 - 8.1 g/dL   Albumin 4.4 3.5 - 5.0 g/dL   AST 32 15 - 41 U/L   ALT 23 0 - 44 U/L   Alkaline Phosphatase 45 38 - 126 U/L   Total Bilirubin 1.5 (H) 0.3 - 1.2 mg/dL   GFR, Estimated 52 (L) >60 mL/min    Comment: (NOTE) Calculated using the CKD-EPI Creatinine Equation (2021)    Anion gap 18 (H) 5 - 15    Comment: Performed at [facility] [address] [place], [place] [address]  Lipase, blood     Status: None   Collection Time: 11/16/22 12:46 PM  Result Value Ref Range   Lipase 25 11 - 51 U/L    Comment: Performed at [facility] [address], [place], [place] [address]  Troponin I (High Sensitivity)     Status:  Abnormal   Collection Time: 11/16/22  1:35 PM  Result Value Ref Range   Troponin I (High Sensitivity) 123 (  HH) <18 ng/L    Comment: CRITICAL VALUE NOTED. VALUE IS CONSISTENT WITH PREVIOUSLY REPORTED/CALLED VALUE (NOTE) Elevated high sensitivity troponin I (hsTnI) values and significant  changes across serial measurements may suggest ACS but many other  chronic and acute conditions are known to elevate hsTnI results.  Refer to the "Links" section for chest pain algorithms and additional  guidance. Performed at [facility], [place] [address] [place], [place] [address]    DG Shoulder Right  Result Date: 11/16/2022 CLINICAL DATA:  Trauma, fall EXAM: RIGHT SHOULDER - 2+ VIEW COMPARISON:  None Available. FINDINGS: No recent fracture or dislocation is seen. Degenerative changes are noted in right shoulder with large bony spurs. Degenerative changes are noted in right [facility] joint with joint space narrowing and bony spurs. Smooth marginated calcifications adjacent to the lateral end of right clavicle may be residual from previous injury. IMPRESSION: No recent fracture is seen. Degenerative changes are noted in right shoulder and right AC joints. Electronically Signed   By: Elmer Picker M.D.   On: 11/16/2022 13:51   DG Chest 1 View  Result Date: 11/16/2022 CLINICAL DATA:  Trauma, fall EXAM: CHEST  1 VIEW COMPARISON:  11/07/2022 FINDINGS: Transverse diameter of heart is increased. There are no signs of pulmonary edema. Right hemidiaphragm is elevated. There are no new infiltrates or signs of pulmonary edema. Left lateral CP angle is indistinct. There is no pneumothorax. Severe degenerative changes are noted in both shoulders and AC joints. IMPRESSION: There are no signs of pulmonary edema or focal pulmonary consolidation. Left lateral CP angle is indistinct which may suggest pleural thickening or minimal effusion. Electronically Signed   By: Elmer Picker M.D.   On: 11/16/2022 13:50   CT  Head Wo Contrast  Result Date: 11/16/2022 CLINICAL DATA:  Fall.  Head and neck trauma. EXAM: CT HEAD WITHOUT CONTRAST CT CERVICAL SPINE WITHOUT CONTRAST TECHNIQUE: Multidetector CT imaging of the head and cervical spine was performed following the standard protocol without intravenous contrast. Multiplanar CT image reconstructions of the cervical spine were also generated. RADIATION DOSE REDUCTION: This exam was performed according to the departmental dose-optimization program which includes automated exposure control, adjustment of the mA and/or kV according to patient size and/or use of iterative reconstruction technique. COMPARISON:  Head CT 11/05/2022.  Cervical spine CT 04/01/2020. FINDINGS: CT HEAD FINDINGS Brain: No acute hemorrhage. Unchanged chronic small-vessel disease. Cortical gray-white differentiation is otherwise preserved. Prominence of the ventricles and sulci within normal limits for age. No extra-axial collection. Basilar cisterns are patent. Vascular: No hyperdense vessel or unexpected calcification. Skull: No calvarial fracture or suspicious bone lesion. Skull base is unremarkable. Sinuses/Orbits: Unremarkable. Other: None. CT CERVICAL SPINE FINDINGS Alignment: Normal. Skull base and vertebrae: No acute fracture. Normal craniocervical junction. No suspicious bone lesions. Soft tissues and spinal canal: No prevertebral fluid or swelling. No visible canal hematoma. Disc levels: Unchanged multilevel cervical spondylosis with calcified disc osteophyte complexes causing at least moderate spinal canal stenosis at C5-6 and C6-7. Upper chest: Unremarkable. Other: Unchanged multinodular thyroid goiter, better evaluated on prior thyroid ultrasound. IMPRESSION: 1. No acute intracranial abnormality. 2. No acute cervical spine fracture or traumatic listhesis. 3. Unchanged multilevel cervical spondylosis with at least moderate spinal canal stenosis at C5-6 and C6-7. Electronically Signed   By: Emmit Alexanders M.D.   On: 11/16/2022 13:03   CT Cervical Spine Wo Contrast  Result Date: 11/16/2022 CLINICAL DATA:  Fall.  Head and neck trauma. EXAM: CT HEAD WITHOUT CONTRAST CT  CERVICAL SPINE WITHOUT CONTRAST TECHNIQUE: Multidetector CT imaging of the head and cervical spine was performed following the standard protocol without intravenous contrast. Multiplanar CT image reconstructions of the cervical spine were also generated. RADIATION DOSE REDUCTION: This exam was performed according to the departmental dose-optimization program which includes automated exposure control, adjustment of the mA and/or kV according to patient size and/or use of iterative reconstruction technique. COMPARISON:  Head CT 11/05/2022.  Cervical spine CT 04/01/2020. FINDINGS: CT HEAD FINDINGS Brain: No acute hemorrhage. Unchanged chronic small-vessel disease. Cortical gray-white differentiation is otherwise preserved. Prominence of the ventricles and sulci within normal limits for age. No extra-axial collection. Basilar cisterns are patent. Vascular: No hyperdense vessel or unexpected calcification. Skull: No calvarial fracture or suspicious bone lesion. Skull base is unremarkable. Sinuses/Orbits: Unremarkable. Other: None. CT CERVICAL SPINE FINDINGS Alignment: Normal. Skull base and vertebrae: No acute fracture. Normal craniocervical junction. No suspicious bone lesions. Soft tissues and spinal canal: No prevertebral fluid or swelling. No visible canal hematoma. Disc levels: Unchanged multilevel cervical spondylosis with calcified disc osteophyte complexes causing at least moderate spinal canal stenosis at C5-6 and C6-7. Upper chest: Unremarkable. Other: Unchanged multinodular thyroid goiter, better evaluated on prior thyroid ultrasound. IMPRESSION: 1. No acute intracranial abnormality. 2. No acute cervical spine fracture or traumatic listhesis. 3. Unchanged multilevel cervical spondylosis with at least moderate spinal canal stenosis at C5-6  and C6-7. Electronically Signed   By: Emmit Alexanders M.D.   On: 11/16/2022 13:03    Pending Labs Unresulted Labs (From admission, onward)     Start     Ordered   11/16/22 1135  Urinalysis, Routine w reflex microscopic -Urine, Clean Catch  Once,   URGENT       Question:  Specimen Source  Answer:  Urine, Clean Catch   11/16/22 1134            Vitals/Pain Today's Vitals   11/16/22 1329 11/16/22 1400 11/16/22 1430 11/16/22 1500  BP:  (!) 132/91 117/76 122/68  Pulse:  65 74 64  Resp:  15 (!) 23 (!) 22  Temp: (!) 97.5 F (36.4 C)     TempSrc: Rectal     SpO2:  97% 100% 98%    Isolation Precautions No active isolations  Medications Medications  acetaminophen (TYLENOL) tablet 650 mg (has no administration in time range)    Or  acetaminophen (TYLENOL) suppository 650 mg (has no administration in time range)  aspirin EC tablet 81 mg (has no administration in time range)  pantoprazole (PROTONIX) EC tablet 20 mg (has no administration in time range)  rosuvastatin (CRESTOR) tablet 5 mg (has no administration in time range)  aspirin chewable tablet 324 mg (324 mg Oral Given 11/16/22 1423)  lactated ringers bolus 500 mL (500 mLs Intravenous New Bag/Given 11/16/22 1655)    Mobility non-ambulatory     Focused Assessments Pt fell has weakness   R Recommendations: See Admitting Provider Note  Report given to:   Additional Notes: ,

## 2022-11-16 NOTE — H&P (Cosign Needed Addendum)
Date: 11/16/2022               Patient Name:  Maria [name] MRN: [id number]  DOB: [birth year] Age / Sex: 89 y.o., female   PCP: Audley Hose, MD         Medical Service: Internal Medicine Teaching Service         Attending Physician: Dr. Kemper Durie, DO    First Contact: Starlyn Skeans, MD Pager: [phone]  Second Contact: Delene Ruffini, MD Pager: Lysbeth Penner [phone]       After Hours (After 5p/  First Contact Pager: [phone]  weekends / holidays): Second Contact Pager: [phone]   SUBJECTIVE   Chief Complaint: Fall  History of Present Illness: Maria [name] is a 89 year old female with a past medical medical history of HTN, HLD, T2DM, CVA (10/2019), complete heart block (s/p pacemaker placement 2003 with generator change 04/2019), HFpEF (Echo 09/2016 EF 50-55% with G1DD), breast cancer (2015), OA, GERD, Alzheimer Dementia, and HSV who presents from her skilled nursing facility [facility]) after an unwitnessed fall. Per daughter, she was found on the ground around 8:10pm last night by nursing staff. Daughter reports patient is near baseline mentation, but has noticed worsening mental status over the last 2 weeks.  Per patient, she was feeling well overall until she fell. She did have some dizziness prior to falling. Denies chest pain or palpitations prior to falling or currently. Patient is unsure if she hit her head but is not complaining of headache at this time.  Has chronic left shoulder pain that is unchanged. Also notes some epigastric pain. Daughter reports that she has been drinking less fluids at [facility] and has not been given the typical amount of water that she usually consumes. She has tolerated food and liquids well since daughter and care assistant are able to feed her despite patient drinking less fluids overall.   Of note, she was admitted recently to our service back on 11/05/22 for altered mental status and UTI with positive Pseudomonas culture and treated with a 5-day  cefepime course. She was discharged 11/11/22 to [facility].   ED Course: No acute findings on imaging (CXR R shoulder, CXR, CT head/cervical spine). Troponins initially elevated to 142, flat at 123. No ischemic changes on EKG. Urinalysis currently pending. Patient was given '324mg'$  of aspirin in the ED. Per ED provider, cardiology recommended not starting heparin at this time. ICD interrogation per cardiology.  Review of Systems: A complete ROS was negative except as per HPI.    Meds:  -Tylenol 650 mg (2 pills in the morning, 2 pills at bedtime) -Meloxicam 7.5 mg daily as needed -Donepezil 10 mg daily -Metformin 1000 mg daily -Aspirin 81 mg daily -Rosuvastatin 5 mg daily -Losartan 50 mg daily -Nitroglycerin 0.4 mg tablet -Zofran 4 mg every 4 hours as needed -Pantoprazole 20 mg daily -Valacyclovir 1000 mg as needed -Cyclosporine ophthalmic solution -Latanoprost ophthalmic solution -Multivitamin  Meds:  Current Meds  Medication Sig   acetaminophen (TYLENOL) 500 MG tablet Take 500 mg by mouth every 6 (six) hours as needed for mild pain.   aspirin EC 81 MG tablet Take 81 mg by mouth daily.   cycloSPORINE (RESTASIS) 0.05 % ophthalmic emulsion Place 1 drop into both eyes daily.    donepezil (ARICEPT) 10 MG tablet Take 10 mg by mouth at bedtime.   latanoprost (XALATAN) 0.005 % ophthalmic solution Place 1 drop into both eyes at bedtime.   losartan (COZAAR) 50 MG tablet Take  50 mg by mouth daily.   meloxicam (MOBIC) 7.5 MG tablet Take 7.5 mg by mouth daily.   Menthol, Topical Analgesic, (BIOFREEZE) 4 % GEL Apply 2 g topically 2 (two) times daily as needed (for painful muscles).   metFORMIN (GLUCOPHAGE) 500 MG tablet Take 500 mg by mouth daily. For 7 days   Multiple Vitamins-Minerals (PRESERVISION AREDS 2+MULTI VIT PO) Take 1 capsule by mouth daily.   nitroGLYCERIN (NITROSTAT) 0.4 MG SL tablet Place 0.4 mg under the tongue every 5 (five) minutes as needed for chest pain.   pantoprazole  (PROTONIX) 20 MG tablet Take 1 tablet (20 mg total) by mouth daily.   REFRESH OPTIVE PF 0.5-0.9 % SOLN Place 1 drop into both eyes daily.   rosuvastatin (CRESTOR) 5 MG tablet Take 5 mg by mouth at bedtime.    Past Medical History:  Diagnosis Date   Anxiety    Arthritis    Cancer ([place])    breast 2015   Cardiac pacemaker in situ    Chest pain    Dyslipidemia    Dyspnea    with exertion   Family history of adverse reaction to anesthesia    daughter slow to wake up   Gallstones    Headache    Hypertension    Macular degeneration    Positive RPR test 07/08/2022   Pulmonary hypertension (HCC)    PA peak pressure 81 mmHg by 09/2016 echo   Type II or unspecified type diabetes mellitus without mention of complication, not stated as uncontrolled    Wears partial dentures     Past Surgical History:  Procedure Laterality Date   BREAST EXCISIONAL BIOPSY Right 2010   CATARACT EXTRACTION     CHOLECYSTECTOMY N/A 01/18/2017   Procedure: LAPAROSCOPIC CHOLECYSTECTOMY;  Surgeon: Ralene Ok, MD;  Location: [place];  Service: General;  Laterality: N/A;   IR GENERIC HISTORICAL  10/08/2016   IR PERC CHOLECYSTOSTOMY 10/08/2016 Greggory Keen, MD [facility]-INTERV RAD   IR RADIOLOGIST EVAL & MGMT  11/16/2016   KNEE SURGERY Bilateral    MULTIPLE TOOTH EXTRACTIONS     PACEMAKER INSERTION     [employer] 2003   PACEMAKER INSERTION     PPM GENERATOR CHANGEOUT N/A 04/16/2019   Procedure: PPM GENERATOR CHANGEOUT;  Surgeon: Evans Lance, MD;  Location: [place] [facility];  Service: Cardiovascular;  Laterality: N/A;   TOTAL KNEE ARTHROPLASTY Bilateral     Social: Since patient discharged back on 2/29 she has been at [facility] to regain her strength. Previously patient currently lived at home with the help of several individuals. Home health PT came to help with getting out of bed, going to the bathroom, dressing, and bathing.  Daughter came around 3 PM to help with meals and getting the patient to  the bathroom. Son came over at 5 PM and stayed until the next morning.  Patient ambulated at baseline with walker but needs help with transfers.  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  Patient has chewed snuff from her teenage years until now. She has never smoked tobacco. Patient currently drinks 1 beer every few months but drank heavily throughout her adolescence into her 33s. No current or prior recreational drug use. Has been DNR for the last 5 years.   Family History: father (MI, prostate cancer), mother (uterine cancer).  Denies family history of stroke.   Surgical History: Bilateral knee replacement, pacemaker placement    Allergies: Allergies as of  11/16/2022 - Review Complete 11/16/2022  Allergen Reaction Noted   Vancomycin Itching 08/08/2020    Review of Systems: A complete ROS was negative except as per HPI.   OBJECTIVE:   Physical Exam: Blood pressure 122/68, pulse 64, temperature (!) 97.5 F (36.4 C), temperature source Rectal, resp. rate (!) 22, SpO2 98 %.  Constitutional: elderly female sitting in bed, thin, in no acute distress, intermittently confused HENT: normocephalic atraumatic, mucous membranes dry Eyes: conjunctiva non-erythematous Neck: supple, no tenderness to palpation of the c-spine Cardiovascular: regular rate and rhythm, no m/r/g. Normal radial and DP pulses. No LE edema. Pulmonary/Chest: normal work of breathing on room air, lungs clear to auscultation bilaterally Abdominal: soft, non-tender, non-distended MSK: normal range of motion, moving all extremities spontaneously Neurological: alert & oriented to person, place, situation, and time Skin: warm and dry, skin turgor decreased Psych: normal mood and affect.  EKG: personally reviewed my interpretation is IVCD w/ LAD, LVH, unchanged from last tracing   CXR: personally reviewed my interpretation pleural thickening or minimal effusion    Labs: CBC    Component Value  Date/Time   WBC 7.9 11/16/2022 1246   RBC 5.19 (H) 11/16/2022 1246   HGB 14.7 11/16/2022 1246   HGB 13.1 04/13/2019 1202   HCT 44.7 11/16/2022 1246   HCT 41.1 04/13/2019 1202   PLT 294 11/16/2022 1246   PLT 232 04/13/2019 1202   MCV 86.1 11/16/2022 1246   MCV 89 04/13/2019 1202   MCH 28.3 11/16/2022 1246   MCHC 32.9 11/16/2022 1246   RDW 13.4 11/16/2022 1246   RDW 13.1 04/13/2019 1202   LYMPHSABS 1.9 11/16/2022 1246   LYMPHSABS 2.4 04/13/2019 1202   MONOABS 0.5 11/16/2022 1246   EOSABS 0.1 11/16/2022 1246   EOSABS 0.0 04/13/2019 1202   BASOSABS 0.0 11/16/2022 1246   BASOSABS 0.0 04/13/2019 1202     CMP     Component Value Date/Time   NA 142 11/16/2022 1246   NA 141 04/13/2019 1202   K 3.9 11/16/2022 1246   CL 102 11/16/2022 1246   CO2 22 11/16/2022 1246   GLUCOSE 160 (H) 11/16/2022 1246   BUN 16 11/16/2022 1246   BUN 21 04/13/2019 1202   CREATININE 1.00 11/16/2022 1246   CALCIUM 9.7 11/16/2022 1246   PROT 8.3 (H) 11/16/2022 1246   ALBUMIN 4.4 11/16/2022 1246   AST 32 11/16/2022 1246   ALT 23 11/16/2022 1246   ALKPHOS 45 11/16/2022 1246   BILITOT 1.5 (H) 11/16/2022 1246   GFRNONAA 52 (L) 11/16/2022 1246   GFRAA >60 04/01/2020 1700    Imaging: DG Shoulder Right  Result Date: 11/16/2022 CLINICAL DATA:  Trauma, fall EXAM: RIGHT SHOULDER - 2+ VIEW COMPARISON:  None Available. FINDINGS: No recent fracture or dislocation is seen. Degenerative changes are noted in right shoulder with large bony spurs. Degenerative changes are noted in right [facility] joint with joint space narrowing and bony spurs. Smooth marginated calcifications adjacent to the lateral end of right clavicle may be residual from previous injury. IMPRESSION: No recent fracture is seen. Degenerative changes are noted in right shoulder and right AC joints. Electronically Signed   By: Elmer Picker M.D.   On: 11/16/2022 13:51   DG Chest 1 View  Result Date: 11/16/2022 CLINICAL DATA:  Trauma, fall EXAM: CHEST   1 VIEW COMPARISON:  11/07/2022 FINDINGS: Transverse diameter of heart is increased. There are no signs of pulmonary edema. Right hemidiaphragm is elevated. There are no new infiltrates or signs of  pulmonary edema. Left lateral CP angle is indistinct. There is no pneumothorax. Severe degenerative changes are noted in both shoulders and AC joints. IMPRESSION: There are no signs of pulmonary edema or focal pulmonary consolidation. Left lateral CP angle is indistinct which may suggest pleural thickening or minimal effusion. Electronically Signed   By: Elmer Picker M.D.   On: 11/16/2022 13:50   CT Head Wo Contrast  Result Date: 11/16/2022 CLINICAL DATA:  Fall.  Head and neck trauma. EXAM: CT HEAD WITHOUT CONTRAST CT CERVICAL SPINE WITHOUT CONTRAST TECHNIQUE: Multidetector CT imaging of the head and cervical spine was performed following the standard protocol without intravenous contrast. Multiplanar CT image reconstructions of the cervical spine were also generated. RADIATION DOSE REDUCTION: This exam was performed according to the departmental dose-optimization program which includes automated exposure control, adjustment of the mA and/or kV according to patient size and/or use of iterative reconstruction technique. COMPARISON:  Head CT 11/05/2022.  Cervical spine CT 04/01/2020. FINDINGS: CT HEAD FINDINGS Brain: No acute hemorrhage. Unchanged chronic small-vessel disease. Cortical gray-white differentiation is otherwise preserved. Prominence of the ventricles and sulci within normal limits for age. No extra-axial collection. Basilar cisterns are patent. Vascular: No hyperdense vessel or unexpected calcification. Skull: No calvarial fracture or suspicious bone lesion. Skull base is unremarkable. Sinuses/Orbits: Unremarkable. Other: None. CT CERVICAL SPINE FINDINGS Alignment: Normal. Skull base and vertebrae: No acute fracture. Normal craniocervical junction. No suspicious bone lesions. Soft tissues and spinal  canal: No prevertebral fluid or swelling. No visible canal hematoma. Disc levels: Unchanged multilevel cervical spondylosis with calcified disc osteophyte complexes causing at least moderate spinal canal stenosis at C5-6 and C6-7. Upper chest: Unremarkable. Other: Unchanged multinodular thyroid goiter, better evaluated on prior thyroid ultrasound. IMPRESSION: 1. No acute intracranial abnormality. 2. No acute cervical spine fracture or traumatic listhesis. 3. Unchanged multilevel cervical spondylosis with at least moderate spinal canal stenosis at C5-6 and C6-7. Electronically Signed   By: Emmit Alexanders M.D.   On: 11/16/2022 13:03   CT Cervical Spine Wo Contrast  Result Date: 11/16/2022 CLINICAL DATA:  Fall.  Head and neck trauma. EXAM: CT HEAD WITHOUT CONTRAST CT CERVICAL SPINE WITHOUT CONTRAST TECHNIQUE: Multidetector CT imaging of the head and cervical spine was performed following the standard protocol without intravenous contrast. Multiplanar CT image reconstructions of the cervical spine were also generated. RADIATION DOSE REDUCTION: This exam was performed according to the departmental dose-optimization program which includes automated exposure control, adjustment of the mA and/or kV according to patient size and/or use of iterative reconstruction technique. COMPARISON:  Head CT 11/05/2022.  Cervical spine CT 04/01/2020. FINDINGS: CT HEAD FINDINGS Brain: No acute hemorrhage. Unchanged chronic small-vessel disease. Cortical gray-white differentiation is otherwise preserved. Prominence of the ventricles and sulci within normal limits for age. No extra-axial collection. Basilar cisterns are patent. Vascular: No hyperdense vessel or unexpected calcification. Skull: No calvarial fracture or suspicious bone lesion. Skull base is unremarkable. Sinuses/Orbits: Unremarkable. Other: None. CT CERVICAL SPINE FINDINGS Alignment: Normal. Skull base and vertebrae: No acute fracture. Normal craniocervical junction. No  suspicious bone lesions. Soft tissues and spinal canal: No prevertebral fluid or swelling. No visible canal hematoma. Disc levels: Unchanged multilevel cervical spondylosis with calcified disc osteophyte complexes causing at least moderate spinal canal stenosis at C5-6 and C6-7. Upper chest: Unremarkable. Other: Unchanged multinodular thyroid goiter, better evaluated on prior thyroid ultrasound. IMPRESSION: 1. No acute intracranial abnormality. 2. No acute cervical spine fracture or traumatic listhesis. 3. Unchanged multilevel cervical spondylosis with at least moderate spinal  canal stenosis at C5-6 and C6-7. Electronically Signed   By: Emmit Alexanders M.D.   On: 11/16/2022 13:03       ASSESSMENT & PLAN:    Assessment & Plan by Problem: Active Problems:   * No active hospital problems. *   JHANIYAH BONES is a 89 y.o. with pertinent PMH of HTN, HLD, T2DM, CVA (10/2019), complete heart block (s/p pacemaker placement 2003 with generator change 04/2019), HFpEF (Echo 09/2016 EF 50-55% with G1DD), breast cancer (2015), OA, GERD, Alzheimer Dementia, and HSV who presents from her skilled nursing facility [facility]) after an unwitnessed fall and admitted for elevated troponins and further evaluation. Currently on hospital day 0.  #Fall Decreased fluid intake at SNF per daughter. Patient reports feeling dizzy prior to falling. Fall was unwitnessed therefore cause is uncertain. No findings of trauma on imaging. Appears dry on exam, dry mucous membranes and decreased skin turgor. Differential includes arrhythmia, orthostatic hypotension, mechanical. Pending ICD interrogation. Suspect likely orthostatic given how dry she appears on exam. Will administer IV fluids. Patient appears intermittently confused. Afebrile without leukocytosis. Normal lung sounds. Low suspicion for infection at this time. Pending UA for assessment of UTI.  -Continue to encourage PO intake, supervised feeds -IV LR 500 mL bolus  1x -Orthostatic VS -PT/OT eval -Full liquid diet -Delirium precautions -Pending UA  #NSTEMI Elevated troponin, but flat. Potentially in the setting of hypovolemia. No ischemic changes on EKG. Cardiology was consulted by ED physician. Pending cardiology recs.  -Telemetry  #HFpEF Echo 09/2016 EF 50-55% w/ G1DD. No signs of fluid overload on exam. Will give gentle fluids given history of HFpEF. -Strict I's and O's -Daily weights  #T2DM A1c 6.0% 5 months ago. On glimepiride 1 mg BID and metformin 1000 mg daily at home. CBGs WNL on admission.  -Consider starting SSI once patient eating more  #Hx of CVA (10/2019) #HLD On Aspirin 81 mg daily and Rosuvastatin 5 mg daily at home. -Continue aspirin 81 mg daily -Continue rosuvastatin 5 mg daily   #OA On Tylenol 650 mg (2X BID) and Meloxicam 7.5 mg daily PRN at home.  -Tylenol 650 mg q6 PRN   #GERD Taking Pantoprazole 20 mg daily at home.  -Continue home Pantoprazole 20 mg daily    #Hx of breast cancer (2015) Hx of right breast benign lumpectomy. Mammogram from 10/2013 demonstrates no evidence of malignancy.   #HSV On valacyclovir 1000 mg PRN at home. No complaints of flare at this time. Will hold for now.  -Hold valacyclovir 1000 mg PRN   #Dispo Recurrent hospitalizations for AMS recently. Will likely need SNF again at discharge. Patient's daughter would like for patient to stay at [facility] after discharge. -Appreciate TOC help   Diet:  Full liquids VTE: SCDs IVF: LR,500 mL Code: DNR  Prior to Admission Living Arrangement: SNF Anticipated Discharge Location: SNF Barriers to Discharge: continued management  Dispo: Admit patient to Observation with expected length of stay less than 2 midnights.  Signed: Marisa Cyphers, Medical Student   11/16/2022, 3:58 PM    Attestation for Student Documentation:  I personally was present and performed or re-performed the history, physical exam and medical decision-making activities  of this service and have verified that the service and findings are accurately documented in the student's note.  Starlyn Skeans, MD 11/16/2022, 5:12 PM

## 2022-11-16 NOTE — ED Triage Notes (Signed)
Per EMS pt had fall last night. Pt more lethargic and fatigued. Pt has shoulder pain on right. Epigastric pain. Vomited once this morning. AxOx1  70 HR 106/64 BP CBG 207 98% RA

## 2022-11-16 NOTE — Progress Notes (Signed)
Remote pacemaker transmission.   

## 2022-11-17 DIAGNOSIS — B009 Herpesviral infection, unspecified: Secondary | ICD-10-CM | POA: Diagnosis present

## 2022-11-17 DIAGNOSIS — Z950 Presence of cardiac pacemaker: Secondary | ICD-10-CM | POA: Diagnosis not present

## 2022-11-17 DIAGNOSIS — W19XXXA Unspecified fall, initial encounter: Secondary | ICD-10-CM

## 2022-11-17 DIAGNOSIS — M4802 Spinal stenosis, cervical region: Secondary | ICD-10-CM | POA: Diagnosis present

## 2022-11-17 DIAGNOSIS — E860 Dehydration: Secondary | ICD-10-CM | POA: Diagnosis present

## 2022-11-17 DIAGNOSIS — Z8249 Family history of ischemic heart disease and other diseases of the circulatory system: Secondary | ICD-10-CM | POA: Diagnosis not present

## 2022-11-17 DIAGNOSIS — Z79899 Other long term (current) drug therapy: Secondary | ICD-10-CM | POA: Diagnosis not present

## 2022-11-17 DIAGNOSIS — F29 Unspecified psychosis not due to a substance or known physiological condition: Secondary | ICD-10-CM | POA: Diagnosis not present

## 2022-11-17 DIAGNOSIS — I21A1 Myocardial infarction type 2: Secondary | ICD-10-CM | POA: Diagnosis present

## 2022-11-17 DIAGNOSIS — R410 Disorientation, unspecified: Secondary | ICD-10-CM | POA: Diagnosis not present

## 2022-11-17 DIAGNOSIS — Z66 Do not resuscitate: Secondary | ICD-10-CM | POA: Diagnosis present

## 2022-11-17 DIAGNOSIS — Z853 Personal history of malignant neoplasm of breast: Secondary | ICD-10-CM | POA: Diagnosis not present

## 2022-11-17 DIAGNOSIS — E119 Type 2 diabetes mellitus without complications: Secondary | ICD-10-CM | POA: Diagnosis present

## 2022-11-17 DIAGNOSIS — M47812 Spondylosis without myelopathy or radiculopathy, cervical region: Secondary | ICD-10-CM | POA: Diagnosis present

## 2022-11-17 DIAGNOSIS — I5032 Chronic diastolic (congestive) heart failure: Secondary | ICD-10-CM | POA: Diagnosis present

## 2022-11-17 DIAGNOSIS — G8929 Other chronic pain: Secondary | ICD-10-CM | POA: Diagnosis present

## 2022-11-17 DIAGNOSIS — R5383 Other fatigue: Secondary | ICD-10-CM | POA: Diagnosis not present

## 2022-11-17 DIAGNOSIS — I272 Pulmonary hypertension, unspecified: Secondary | ICD-10-CM | POA: Diagnosis present

## 2022-11-17 DIAGNOSIS — Y92129 Unspecified place in nursing home as the place of occurrence of the external cause: Secondary | ICD-10-CM | POA: Diagnosis not present

## 2022-11-17 DIAGNOSIS — E785 Hyperlipidemia, unspecified: Secondary | ICD-10-CM | POA: Diagnosis present

## 2022-11-17 DIAGNOSIS — I951 Orthostatic hypotension: Secondary | ICD-10-CM

## 2022-11-17 DIAGNOSIS — K219 Gastro-esophageal reflux disease without esophagitis: Secondary | ICD-10-CM | POA: Diagnosis present

## 2022-11-17 DIAGNOSIS — I442 Atrioventricular block, complete: Secondary | ICD-10-CM | POA: Diagnosis present

## 2022-11-17 DIAGNOSIS — Z7401 Bed confinement status: Secondary | ICD-10-CM | POA: Diagnosis not present

## 2022-11-17 DIAGNOSIS — R531 Weakness: Secondary | ICD-10-CM | POA: Diagnosis not present

## 2022-11-17 DIAGNOSIS — Z96653 Presence of artificial knee joint, bilateral: Secondary | ICD-10-CM | POA: Diagnosis present

## 2022-11-17 DIAGNOSIS — G309 Alzheimer's disease, unspecified: Secondary | ICD-10-CM | POA: Diagnosis present

## 2022-11-17 DIAGNOSIS — Z8049 Family history of malignant neoplasm of other genital organs: Secondary | ICD-10-CM | POA: Diagnosis not present

## 2022-11-17 DIAGNOSIS — Z8673 Personal history of transient ischemic attack (TIA), and cerebral infarction without residual deficits: Secondary | ICD-10-CM | POA: Diagnosis not present

## 2022-11-17 DIAGNOSIS — I110 Hypertensive heart disease with heart failure: Secondary | ICD-10-CM | POA: Diagnosis present

## 2022-11-17 DIAGNOSIS — F0280 Dementia in other diseases classified elsewhere without behavioral disturbance: Secondary | ICD-10-CM | POA: Diagnosis present

## 2022-11-17 LAB — COMPREHENSIVE METABOLIC PANEL
ALT: 20 U/L (ref 0–44)
AST: 22 U/L (ref 15–41)
Albumin: 3.8 g/dL (ref 3.5–5.0)
Alkaline Phosphatase: 45 U/L (ref 38–126)
Anion gap: 11 (ref 5–15)
BUN: 18 mg/dL (ref 8–23)
CO2: 24 mmol/L (ref 22–32)
Calcium: 9.1 mg/dL (ref 8.9–10.3)
Chloride: 105 mmol/L (ref 98–111)
Creatinine, Ser: 0.81 mg/dL (ref 0.44–1.00)
GFR, Estimated: >60 mL/min (ref >60)
Glucose, Bld: 135 mg/dL — ABNORMAL HIGH (ref 70–99)
Potassium: 3.5 mmol/L (ref 3.5–5.1)
Sodium: 140 mmol/L (ref 135–145)
Total Bilirubin: 1.5 mg/dL — ABNORMAL HIGH (ref 0.3–1.2)
Total Protein: 7.8 g/dL (ref 6.5–8.1)

## 2022-11-17 MED ORDER — LACTATED RINGERS IV SOLN: INTRAVENOUS | Status: DC

## 2022-11-17 MED ORDER — WHITE PETROLATUM EX OINT: TOPICAL_OINTMENT | CUTANEOUS | Status: DC | PRN

## 2022-11-17 MED ORDER — ENOXAPARIN SODIUM 40 MG/0.4ML IJ SOSY
40.0000 mg | PREFILLED_SYRINGE | Freq: Every day | INTRAMUSCULAR | Status: DC
  Administered 2022-11-17 – 2022-11-18 (×2): 40 mg via SUBCUTANEOUS
  Filled 2022-11-17 (×2): qty 0.4

## 2022-11-17 MED ORDER — DICLOFENAC SODIUM 1 % EX GEL
2.0000 g | Freq: Four times a day (QID) | CUTANEOUS | Status: DC
  Administered 2022-11-17 – 2022-11-18 (×5): 2 g via TOPICAL
  Filled 2022-11-17: qty 100

## 2022-11-17 MED ORDER — LACTATED RINGERS IV BOLUS: 500.0000 mL | Freq: Once | INTRAVENOUS | Status: DC

## 2022-11-17 MED ORDER — LACTATED RINGERS IV BOLUS
1000.0000 mL | Freq: Once | INTRAVENOUS | Status: AC
  Administered 2022-11-17: 1000 mL via INTRAVENOUS

## 2022-11-17 NOTE — TOC Initial Note (Signed)
Transition of Care [facility]) - Initial/Assessment Note    Patient Details  Name: Maria [name] MRN: [id number] Date of Birth: [birth year]  Transition of [facility] CM/SW Contact:    Coralee Pesa, [place] Phone Number: 11/17/2022, 10:33 AM  Clinical Narrative:                 CSW noting pt admitted [facility] [place] [facility]. CSW followed up with dtr and confirmed she would like pt to return, requested PTAR. Dtr is HCPOA not LG, flag will be removed. CSW spoke with [facility] who confirmed they will not need anything for her to return, since she is not admitted. If pt switches to inpatient, a new FL2 and PT note will be needed. TOC will continue to follow for DC needs.   Expected Discharge Plan: Skilled Nursing Facility Barriers to Discharge: Continued Medical Work up   Patient Goals and CMS Choice Patient states their goals for this hospitalization and ongoing recovery are:: Pt disoriented and unable to participate in goal setting. CMS Medicare.gov Compare Post Acute Care list provided to:: Patient Represenative (must comment) (Daughter) Choice offered to / list presented to : Adult Children      Expected Discharge Plan and Services     Post Acute Care Choice: [place] arrangements for the past 2 months: Single Family Home                                      Prior Living Arrangements/Services Living arrangements for the past 2 months: Single Family Home Lives with:: Adult Children Patient language and need for interpreter reviewed:: Yes Do you feel safe going back to the place where you live?: Yes      Need for Family Participation in Patient Care: Yes (Comment) Care giver support system in place?: Yes (comment)   Criminal Activity/Legal Involvement Pertinent to Current Situation/Hospitalization: No - Comment as needed  Activities of Daily Living      Permission Sought/Granted Permission sought to share information with : Family Supports,  Chartered certified accountant granted to share information with : Yes, Verbal Permission Granted  Share Information with NAME: [name]  Permission granted to share info w AGENCY: [place] granted to share info w Relationship: Daughter     Emotional Assessment Appearance:: Appears stated age Attitude/Demeanor/Rapport: Unable to Assess Affect (typically observed): Unable to Assess Orientation: : Oriented to Self, Oriented to Place Alcohol / Substance Use: Not Applicable Psych Involvement: No (comment)  Admission diagnosis:  Fall [W19.XXXA] Elevated troponin [R79.89] Generalized weakness [R53.1] Fall, initial encounter [W19.XXXA] Patient Active Problem List   Diagnosis Date Noted   Fall 11/16/2022   Urinary tract infection 11/05/2022   Positive RPR test 07/08/2022   Pneumonia of right lung due to infectious organism 06/03/2022   Alzheimer's dementia with behavioral disturbance ([place]) 06/03/2022   Prolonged QT interval XX123456   Toxic metabolic encephalopathy XX123456   Acute metabolic encephalopathy 123456   Acute cystitis without hematuria 06/02/2022   Chronic diastolic CHF (congestive heart failure) ([place]) 06/02/2022   Mixed diabetic hyperlipidemia associated with type 2 diabetes mellitus ([place]) 06/02/2022   GERD without esophagitis 06/02/2022   At risk for falls 03/11/2021   Bilateral hearing loss 03/11/2021   Dementia with behavioral disturbance ([place]) 03/11/2021   Depressive disorder 03/11/2021   Pulmonary hypertension ([place]) 03/11/2021   Insomnia 12/23/2020   Dysphagia, unspecified 12/23/2020  Primary generalized (osteo)arthritis 12/15/2020   Occlusion and stenosis of unspecified carotid artery 09/13/2020   Long term (current) use of oral hypoglycemic drugs 09/13/2020   History of falling 09/13/2020   Retention of urine, unspecified 09/13/2020   Unspecified urinary incontinence 09/13/2020   Severe sepsis ([place]) 08/08/2020   Acute kidney injury  ([place]) 08/08/2020   Acute UTI (urinary tract infection) 08/08/2020   Carotid atherosclerosis 05/29/2020   Multinodular goiter 05/29/2020   Multiple lacunar infarcts ([place]) 05/29/2020   Abnormal serum thyroid stimulating hormone (TSH) level 05/28/2020   Constipation 11/27/2019   Impaired cognition 11/27/2019   Elevated lactic acid level 10/17/2019   Generalized weakness 10/17/2019   Hypokalemia 10/17/2019   Dehydration 10/17/2019   Acute encephalopathy 10/16/2019   Heart block AV complete ([place]) 04/13/2019   Normocytic anemia 11/01/2018   Diverticulosis of colon 07/22/2018   Mixed hyperlipidemia 11/22/2017   Nephropathy due to secondary diabetes mellitus ([place]) 11/22/2017   Polyneuropathy due to type 2 diabetes mellitus ([place]) 06/30/2017   Acute acalculous cholecystitis 10/07/2016   Elevated troponin 10/07/2016   Lactic acidosis 10/07/2016   Sinus tachycardia 10/07/2016   Dyslipidemia 06/05/2013   Cardiac pacemaker 05/30/2013   Type 2 diabetes mellitus with diabetic polyneuropathy, without long-term current use of insulin ([place]) 05/21/2013   Chest pain 05/20/2013   Essential hypertension 05/20/2013   PCP:  Audley Hose, MD Pharmacy:   [facility] [address], [place] [address] Phone: [phone] Fax: [place] [address], [place] - [address] [place] [address] [place] [address] Phone: [phone] Fax: [phone]     Social Determinants of Health (SDOH) Social History: SDOH Screenings   Food Insecurity: No Food Insecurity (11/06/2022)  Housing: Low Risk  (07/26/2022)  Transportation Needs: No Transportation Needs (11/06/2022)  Utilities: Not At Risk (11/06/2022)  Financial Resource Strain: Low Risk  (08/02/2022)  Tobacco Use: High Risk (11/05/2022)   SDOH Interventions:     Readmission Risk Interventions     No data to display

## 2022-11-17 NOTE — Progress Notes (Signed)
This nurse attempt to do orthostatics on this patient she was only able to sit enough without falling over to get the sitting ortho. Place vitals in the flowsheet. Patient is back bed, pure wick in place and floor mat down.

## 2022-11-17 NOTE — Discharge Instructions (Addendum)
You were hospitalized for a fall.   Hospital Course: We believe that you fell due to being dehydrated. We gave you IV fluids to help rehydrate you. We got your up and working with the physical therapists and occupational therapists. Please continue to hydrate well at your skilled nursing facility. Please assist the patient with feeding and ensure she has adequate hydration.    Physical Therapy/Occupational Therapy: you will go back to the skilled nursing facility to build up your strength before going home.   Follow-up: - Please follow up with your primary care provider Audley Hose, MD at the [facility] (please call to schedule an appointment) Phone: [phone] Address: [phone] [address] [place], [place],  [address]

## 2022-11-17 NOTE — Progress Notes (Signed)
   11/17/22 1549  Assess: MEWS Score  Temp (!) 97.5 F (36.4 C)  BP 132/61  MAP (mmHg) 83  Pulse Rate (!) 46  Resp 16  SpO2 94 %  O2 Device Room Air  Assess: MEWS Score  MEWS Temp 0  MEWS Systolic 0  MEWS Pulse 1  MEWS RR 0  MEWS LOC 1  MEWS Score 2  MEWS Score Color Yellow  Assess: if the MEWS score is Yellow or Red  Were vital signs taken at a resting state? Yes  Focused Assessment Change from prior assessment (see assessment flowsheet)  Does the patient meet 2 or more of the SIRS criteria? No  MEWS guidelines implemented  No, vital signs rechecked  Assess: SIRS CRITERIA  SIRS Temperature  0  SIRS Pulse 0  SIRS Respirations  0  SIRS WBC 0  SIRS Score Sum  0   Vitals retaking and pulse is 51. Pt is up in the chair and resting.

## 2022-11-17 NOTE — Plan of Care (Signed)
  Problem: Clinical Measurements: Goal: Diagnostic test results will improve Outcome: Progressing   Problem: Nutrition: Goal: Adequate nutrition will be maintained Outcome: Progressing   

## 2022-11-17 NOTE — Evaluation (Signed)
Physical Therapy Evaluation  Patient Details Name: VENISHA GILLION MRN: [id number] DOB: 1924-06-28 Today's Date: 11/17/2022  History of Present Illness  Pt is a 89 y/o female who presents s/p unwitnessed fall at SNF [facility]). Pt with recent admission and d/c to SNF following treatment for UTI 2/23-2/29. PMH significant for dementia, HTN, CVA, complete heart block s/p pacemaker, HFpEF, and NIDDM.   Clinical Impression  Pt admitted with above diagnosis. Pt currently with functional limitations due to the deficits listed below (see PT Problem List). At the time of PT eval pt was able to perform transfers with up to +2 assist and RW for support. Pt anticipates d/c back to SNF rehab. Orthostatics outlined below. Unable to achieve 3 minute standing reading. Acutely, pt will benefit from skilled PT to increase their independence and safety with mobility to allow discharge to the venue listed below.       Orthostatic BPs  Supine 124/60  Sitting 133/60  Standing 131/73        Recommendations for follow up therapy are one component of a multi-disciplinary discharge planning process, led by the attending physician.  Recommendations may be updated based on patient status, additional functional criteria and insurance authorization.  Follow Up Recommendations Skilled nursing-short term rehab (<3 hours/day) Can patient physically be transported by private vehicle: No    Assistance Recommended at Discharge Frequent or constant Supervision/Assistance  Patient can return home with the following  Two people to help with walking and/or transfers;A lot of help with bathing/dressing/bathroom;Assistance with cooking/housework;Direct supervision/assist for medications management;Direct supervision/assist for financial management;Help with stairs or ramp for entrance    Equipment Recommendations Other (comment) (TBD at next venue)  Recommendations for Other Services       Functional Status Assessment  Patient has had a recent decline in their functional status and demonstrates the ability to make significant improvements in function in a reasonable and predictable amount of time.     Precautions / Restrictions Precautions Precautions: Fall Restrictions Weight Bearing Restrictions: No      Mobility  Bed Mobility Overal bed mobility: Needs Assistance Bed Mobility: Supine to Sit, Rolling Rolling: Total assist, +2 for physical assistance   Supine to sit: Total assist, +2 for physical assistance     General bed mobility comments: Bed pad utilized to assist in rolling, trunk elevation into full sitting position.    Transfers Overall transfer level: Needs assistance Equipment used: Rolling walker (2 wheels), 2 person hand held assist Transfers: Sit to/from Stand, Bed to chair/wheelchair/BSC Sit to Stand: Mod assist, +2 physical assistance Stand pivot transfers: Mod assist, +2 physical assistance Step pivot transfers: Mod assist, +2 physical assistance       General transfer comment: Initially +2 HHA from bed to [facility], then +2 with RW for pivotal steps around from [facility] to recliner. Max assist for walker management.    Ambulation/Gait               General Gait Details: Not assessed this session.  Stairs            Wheelchair Mobility    Modified Rankin (Stroke Patients Only)       Balance Overall balance assessment: Needs assistance Sitting-balance support: Bilateral upper extremity supported, Feet supported Sitting balance-Leahy Scale: Fair     Standing balance support: During functional activity, Bilateral upper extremity supported Standing balance-Leahy Scale: Poor Standing balance comment: forward leaning posture  Pertinent Vitals/Pain Pain Assessment Pain Assessment: Faces Faces Pain Scale: Hurts little more Pain Location: buttocks and periareas Pain Descriptors / Indicators: Burning Pain Intervention(s):  Limited activity within patient's tolerance, Monitored during session, Repositioned, Other (comment) (Barrier cream applied)    Home Living Family/patient expects to be discharged to:: Skilled nursing facility Living Arrangements: Alone;Children Available Help at Discharge: Family;Available PRN/intermittently Type of Home: House         Home Layout: One level Home Equipment: Conservation officer, nature (2 wheels) Additional Comments: Recently d/c to [employer] and expects to return at d/c.    Prior Function Prior Level of Function : Needs assist             Mobility Comments: Pt states she was transferring with a RW and staff support. For distances, she is utilizing a wheelchair. ADLs Comments: Pt reports she has been brushing her teeth and washing her own face, but all other ADL's performed by staff     Hand Dominance   Dominant Hand: Right    Extremity/Trunk Assessment   Upper Extremity Assessment Upper Extremity Assessment: Generalized weakness    Lower Extremity Assessment Lower Extremity Assessment: Generalized weakness    Cervical / Trunk Assessment Cervical / Trunk Assessment: Kyphotic  Communication   Communication: No difficulties  Cognition Arousal/Alertness: Awake/alert Behavior During Therapy: Flat affect Overall Cognitive Status: History of cognitive impairments - at baseline                                          General Comments      Exercises     Assessment/Plan    PT Assessment Patient needs continued PT services  PT Problem List Decreased strength;Decreased mobility;Pain;Decreased balance;Decreased activity tolerance;Decreased cognition       PT Treatment Interventions Therapeutic activities;Gait training;Therapeutic exercise;Patient/family education;Balance training;Functional mobility training;Wheelchair mobility training;Cognitive remediation;DME instruction    PT Goals (Current goals can be found in the Care Plan section)   Acute Rehab PT Goals Patient Stated Goal: None stated PT Goal Formulation: Patient unable to participate in goal setting Time For Goal Achievement: 12/01/22 Potential to Achieve Goals: Fair    Frequency Min 2X/week     Co-evaluation PT/OT/SLP Co-Evaluation/Treatment: Yes Reason for Co-Treatment: For patient/therapist safety;To address functional/ADL transfers PT goals addressed during session: Mobility/safety with mobility;Balance;Proper use of DME;Strengthening/ROM         AM-PAC PT "6 Clicks" Mobility  Outcome Measure Help needed turning from your back to your side while in a flat bed without using bedrails?: Total Help needed moving from lying on your back to sitting on the side of a flat bed without using bedrails?: Total Help needed moving to and from a bed to a chair (including a wheelchair)?: Total Help needed standing up from a chair using your arms (e.g., wheelchair or bedside chair)?: Total Help needed to walk in hospital room?: Total Help needed climbing 3-5 steps with a railing? : Total 6 Click Score: 6    End of Session Equipment Utilized During Treatment: Gait belt Activity Tolerance: Patient limited by fatigue Patient left: in bed;with call bell/phone within reach;with nursing/sitter in room Nurse Communication: Mobility status PT Visit Diagnosis: Muscle weakness (generalized) (M62.81);Difficulty in walking, not elsewhere classified (R26.2) Pain - Right/Left: Left Pain - part of body: Shoulder;Arm    Time: 1341-1425 PT Time Calculation (min) (ACUTE ONLY): 44 min   Charges:   PT Evaluation $PT  Eval Moderate Complexity: 1 Mod PT Treatments $Therapeutic Activity: 8-22 mins        Rolinda Roan, PT, DPT Acute Rehabilitation Services Secure Chat Preferred Office: [phone]   Thelma Comp 11/17/2022, 3:45 PM

## 2022-11-17 NOTE — Discharge Summary (Incomplete)
Name: Maria [name] MRN: [id number] DOB: [birth year] 89 y.o. PCP: Audley Hose, MD  Date of Admission: 11/16/2022 11:12 AM Date of Discharge: 11/17/2022 Attending Physician: Charise Killian, MD  Discharge Diagnosis: 1. Principal Problem:   Fall  Active Problems:   NSTEMI   HFpEF   T2DM   History of CVA   History of breast cancer   Hyperlipidemia   Osteoarthritis    GERD   HSV  Discharge Medications: Allergies as of 11/17/2022       Reactions   Vancomycin Itching     Med Rec must be completed prior to using this [facility]***       Disposition and follow-up:   Ms.Maria [name] was discharged from [facility] in Good condition.  At the hospital follow up visit please address:  1.  ***  A. Fall  - Suspect orthostatic hypotension 2/2 decreased PO intake, gave IV fluids, encourage increase PO intake as outpatient   B. NSTEMI  -  2.  Labs / imaging needed at time of follow-up: CMP  3.  Pending labs/ test needing follow-up: none  Follow-up Appointments: - Please follow up with your primary care provider Audley Hose, MD at the [facility] (please call to schedule an appointment) Phone: [phone] Address: [phone], [place], [place], [place] [address]  Hospital Course by problem list: *** Maria [name] is a 89 y.o. with pertinent PMH of HTN, HLD, T2DM, CVA (10/2019), complete heart block (s/p pacemaker placement 2003 with generator change 04/2019), HFpEF (Echo 09/2016 EF 50-55% with G1DD), breast cancer (2015), OA, GERD, Alzheimer Dementia, and HSV who presents from her skilled nursing facility [facility]) after an unwitnessed fall and admitted for elevated troponins and further evaluation. Currently on hospital day 0.   #Fall Decreased fluid intake at SNF per daughter. Patient reports feeling dizzy prior to falling. Fall was unwitnessed therefore cause is uncertain. No findings of trauma on imaging. Appears dry on  exam, dry mucous membranes and decreased skin turgor. Differential includes arrhythmia, orthostatic hypotension, mechanical. Pending ICD interrogation. Suspect likely orthostatic given how dry she appears on exam. Will administer IV fluids. Patient appears intermittently confused. Afebrile without leukocytosis. Normal lung sounds. Low suspicion for infection at this time. Pending UA for assessment of UTI.  -Continue to encourage PO intake, supervised feeds -IV LR 500 mL bolus 1x -Orthostatic VS -PT/OT eval -Full liquid diet -Delirium precautions -Pending UA   #NSTEMI Elevated troponin, but flat. Potentially in the setting of hypovolemia. No ischemic changes on EKG. Cardiology was consulted by ED physician. Pending cardiology recs.  -Telemetry   #HFpEF Echo 09/2016 EF 50-55% w/ G1DD. No signs of fluid overload on exam. Will give gentle fluids given history of HFpEF. -Strict I's and O's -Daily weights   #T2DM A1c 6.0% 5 months ago. On glimepiride 1 mg BID and metformin 1000 mg daily at home. CBGs WNL on admission.  -Consider starting SSI once patient eating more   #Hx of CVA (10/2019) #HLD On Aspirin 81 mg daily and Rosuvastatin 5 mg daily at home. -Continue aspirin 81 mg daily -Continue rosuvastatin 5 mg daily   #OA On Tylenol 650 mg (2X BID) and Meloxicam 7.5 mg daily PRN at home.  -Tylenol 650 mg q6 PRN   #GERD Taking Pantoprazole 20 mg daily at home.  -Continue home Pantoprazole 20 mg daily    #Hx of breast cancer (2015) Hx of right breast benign lumpectomy. Mammogram from  10/2013 demonstrates no evidence of malignancy.   #HSV On valacyclovir 1000 mg PRN at home. No complaints of flare at this time. Will hold for now.  -Hold valacyclovir 1000 mg PRN   #Dispo Recurrent hospitalizations for AMS recently. Will likely need SNF again at discharge. Patient's daughter would like for patient to stay at [facility] after discharge. -Appreciate TOC help    Discharge Exam:   BP  (!) 134/59 (BP Location: Right Arm)   Pulse (!) 59   Temp (!) 97.4 F (36.3 C) (Oral)   Resp 15   Wt 58 kg   SpO2 100%   BMI 21.95 kg/m  Constitutional: elderly female resting in bed, thin, in no acute distress, intermittently confused but responding to questions appropriately HENT: normocephalic atraumatic, mucous membranes dry Eyes: conjunctiva non-erythematous Neck: supple, no tenderness to palpation of the c-spine Cardiovascular: regular rate and rhythm, no m/r/g. Normal radial and DP pulses. No LE edema. Pulmonary/Chest: normal work of breathing on room air, lungs clear to auscultation bilaterally Abdominal: soft, non-tender, non-distended MSK: normal range of motion, moving all extremities spontaneously Neurological: alert & oriented to person, place, situation, and time Skin: warm and dry, skin turgor decreased Psych: normal mood and affect.  Pertinent Labs, Studies, and Procedures:     Latest Ref Rng & Units 11/16/2022   12:46 PM 11/10/2022    4:32 AM 11/06/2022   12:42 AM  CBC  WBC 4.0 - 10.5 K/uL 7.9  8.3  7.5   Hemoglobin 12.0 - 15.0 g/dL 14.7  12.6  14.1   Hematocrit 36.0 - 46.0 % 44.7  38.3  42.2   Platelets 150 - 400 K/uL 294  231  257        Latest Ref Rng & Units 11/17/2022    3:37 AM 11/16/2022   12:46 PM 11/11/2022    6:26 AM  BMP  Glucose 70 - 99 mg/dL 135  160  130   BUN 8 - 23 mg/dL '18  16  9   '$ Creatinine 0.44 - 1.00 mg/dL 0.81  1.00  0.61   Sodium 135 - 145 mmol/L 140  142  137   Potassium 3.5 - 5.1 mmol/L 3.5  3.9  4.1   Chloride 98 - 111 mmol/L 105  102  105   CO2 22 - 32 mmol/L '24  22  24   '$ Calcium 8.9 - 10.3 mg/dL 9.1  9.7  8.9     CT Head Wo Contrast  IMPRESSION: 1. No acute intracranial abnormality. 2. No acute cervical spine fracture or traumatic listhesis. 3. Unchanged multilevel cervical spondylosis with at least moderate spinal canal stenosis at C5-6 and C6-7.   On: 11/16/2022 13:03  CT Cervical Spine Wo Contrast  IMPRESSION: 1. No  acute intracranial abnormality. 2. No acute cervical spine fracture or traumatic listhesis. 3. Unchanged multilevel cervical spondylosis with at least moderate spinal canal stenosis at C5-6 and C6-7.   On: 11/16/2022 13:03  DG Chest 1 View  IMPRESSION: There are no signs of pulmonary edema or focal pulmonary consolidation. Left lateral CP angle is indistinct which may suggest pleural thickening or minimal effusion.   On: 11/16/2022 13:50  DG Shoulder Right  IMPRESSION: No recent fracture is seen. Degenerative changes are noted in right shoulder and right AC joints.   On: 11/16/2022 13:51  Discharge Instructions:  ***  Signed: Starlyn Skeans, MD 11/17/2022, 10:30 AM   Pager: [phone]

## 2022-11-17 NOTE — Progress Notes (Addendum)
HD#0 SUBJECTIVE:  Patient Summary: Maria [name] is a 89 y.o. with a pertinent PMH of HTN, HLD, T2DM, CVA (10/2019), complete heart block (s/p pacemaker placement 2003 with generator change 04/2019), HFpEF (Echo 09/2016 EF 50-55% with G1DD), breast cancer (2015), OA, GERD, Alzheimer Dementia, and HSV who presents from her skilled nursing facility [facility]) after an unwitnessed fall and is admitted for elevated troponins and further evaluation.  Overnight Events: No overnight events. Patient was somnolent when we visited her today, but she was able to state her full name and her current location. Patient is able to follow commands and answer questions briefly. Per nursing, she reports some shoulder and foot pain.   OBJECTIVE:  Vital Signs: Vitals:   11/16/22 2358 11/17/22 0417 11/17/22 0417 11/17/22 0747  BP: (!) 142/64  (!) 146/66 (!) 134/59  Pulse: 60  61 (!) 59  Resp: '14  15 15  '$ Temp: (!) 97.5 F (36.4 C)  (!) 97.5 F (36.4 C) (!) 97.4 F (36.3 C)  TempSrc: Oral  Oral Oral  SpO2: 100%  100% 100%  Weight:  58 kg     Supplemental O2: Room Air SpO2: 100 %  Filed Weights   11/17/22 0417  Weight: 58 kg    No intake or output data in the 24 hours ending 11/17/22 1029 Net IO Since Admission: No IO data has been entered for this period [11/17/22 1029]  Physical Exam: Physical Exam Constitutional:      General: She is not in acute distress.    Appearance: She is not ill-appearing or diaphoretic.     Comments: Somnolent but arousable to verbal stimuli  HENT:     Head: Normocephalic and atraumatic.     Mouth/Throat:     Mouth: Mucous membranes are dry.  Cardiovascular:     Rate and Rhythm: Normal rate and regular rhythm.  Pulmonary:     Effort: Pulmonary effort is normal. No respiratory distress.     Breath sounds: Normal breath sounds.  Abdominal:     General: Abdomen is flat. Bowel sounds are normal. There is no distension.     Palpations: Abdomen is soft.      Tenderness: There is no abdominal tenderness.  Skin:    General: Skin is warm and dry.     Comments: Decreased skin turgor  Neurological:     General: No focal deficit present.     Mental Status: Mental status is at baseline.  Psychiatric:        Behavior: Behavior is cooperative.     Patient Lines/Drains/Airways Status     Active Line/Drains/Airways     Name Placement date Placement time Site Days   Peripheral IV 11/16/22 22 G 1" Anterior;Distal;Left Forearm 11/16/22  1655  Forearm  1   External Urinary Catheter 11/09/22  1000  --  8            Pertinent Labs:    Latest Ref Rng & Units 11/16/2022   12:46 PM 11/10/2022    4:32 AM 11/06/2022   12:42 AM  CBC  WBC 4.0 - 10.5 K/uL 7.9  8.3  7.5   Hemoglobin 12.0 - 15.0 g/dL 14.7  12.6  14.1   Hematocrit 36.0 - 46.0 % 44.7  38.3  42.2   Platelets 150 - 400 K/uL 294  231  257        Latest Ref Rng & Units 11/17/2022    3:37 AM 11/16/2022   12:46 PM 11/11/2022  6:26 AM  CMP  Glucose 70 - 99 mg/dL 135  160  130   BUN 8 - 23 mg/dL '18  16  9   '$ Creatinine 0.44 - 1.00 mg/dL 0.81  1.00  0.61   Sodium 135 - 145 mmol/L 140  142  137   Potassium 3.5 - 5.1 mmol/L 3.5  3.9  4.1   Chloride 98 - 111 mmol/L 105  102  105   CO2 22 - 32 mmol/L '24  22  24   '$ Calcium 8.9 - 10.3 mg/dL 9.1  9.7  8.9   Total Protein 6.5 - 8.1 g/dL 7.8  8.3    Total Bilirubin 0.3 - 1.2 mg/dL 1.5  1.5    Alkaline Phos 38 - 126 U/L 45  45    AST 15 - 41 U/L 22  32    ALT 0 - 44 U/L 20  23      No results for input(s): "GLUCAP" in the last 72 hours.   Pertinent Imaging: DG Shoulder Right  Result Date: 11/16/2022 CLINICAL DATA:  Trauma, fall EXAM: RIGHT SHOULDER - 2+ VIEW COMPARISON:  None Available. FINDINGS: No recent fracture or dislocation is seen. Degenerative changes are noted in right shoulder with large bony spurs. Degenerative changes are noted in right [facility] joint with joint space narrowing and bony spurs. Smooth marginated calcifications adjacent to  the lateral end of right clavicle may be residual from previous injury. IMPRESSION: No recent fracture is seen. Degenerative changes are noted in right shoulder and right AC joints. Electronically Signed   By: Elmer Picker M.D.   On: 11/16/2022 13:51   DG Chest 1 View  Result Date: 11/16/2022 CLINICAL DATA:  Trauma, fall EXAM: CHEST  1 VIEW COMPARISON:  11/07/2022 FINDINGS: Transverse diameter of heart is increased. There are no signs of pulmonary edema. Right hemidiaphragm is elevated. There are no new infiltrates or signs of pulmonary edema. Left lateral CP angle is indistinct. There is no pneumothorax. Severe degenerative changes are noted in both shoulders and AC joints. IMPRESSION: There are no signs of pulmonary edema or focal pulmonary consolidation. Left lateral CP angle is indistinct which may suggest pleural thickening or minimal effusion. Electronically Signed   By: Elmer Picker M.D.   On: 11/16/2022 13:50   CT Head Wo Contrast  Result Date: 11/16/2022 CLINICAL DATA:  Fall.  Head and neck trauma. EXAM: CT HEAD WITHOUT CONTRAST CT CERVICAL SPINE WITHOUT CONTRAST TECHNIQUE: Multidetector CT imaging of the head and cervical spine was performed following the standard protocol without intravenous contrast. Multiplanar CT image reconstructions of the cervical spine were also generated. RADIATION DOSE REDUCTION: This exam was performed according to the departmental dose-optimization program which includes automated exposure control, adjustment of the mA and/or kV according to patient size and/or use of iterative reconstruction technique. COMPARISON:  Head CT 11/05/2022.  Cervical spine CT 04/01/2020. FINDINGS: CT HEAD FINDINGS Brain: No acute hemorrhage. Unchanged chronic small-vessel disease. Cortical gray-white differentiation is otherwise preserved. Prominence of the ventricles and sulci within normal limits for age. No extra-axial collection. Basilar cisterns are patent. Vascular: No  hyperdense vessel or unexpected calcification. Skull: No calvarial fracture or suspicious bone lesion. Skull base is unremarkable. Sinuses/Orbits: Unremarkable. Other: None. CT CERVICAL SPINE FINDINGS Alignment: Normal. Skull base and vertebrae: No acute fracture. Normal craniocervical junction. No suspicious bone lesions. Soft tissues and spinal canal: No prevertebral fluid or swelling. No visible canal hematoma. Disc levels: Unchanged multilevel cervical spondylosis with calcified disc osteophyte complexes  causing at least moderate spinal canal stenosis at C5-6 and C6-7. Upper chest: Unremarkable. Other: Unchanged multinodular thyroid goiter, better evaluated on prior thyroid ultrasound. IMPRESSION: 1. No acute intracranial abnormality. 2. No acute cervical spine fracture or traumatic listhesis. 3. Unchanged multilevel cervical spondylosis with at least moderate spinal canal stenosis at C5-6 and C6-7. Electronically Signed   By: Emmit Alexanders M.D.   On: 11/16/2022 13:03   CT Cervical Spine Wo Contrast  Result Date: 11/16/2022 CLINICAL DATA:  Fall.  Head and neck trauma. EXAM: CT HEAD WITHOUT CONTRAST CT CERVICAL SPINE WITHOUT CONTRAST TECHNIQUE: Multidetector CT imaging of the head and cervical spine was performed following the standard protocol without intravenous contrast. Multiplanar CT image reconstructions of the cervical spine were also generated. RADIATION DOSE REDUCTION: This exam was performed according to the departmental dose-optimization program which includes automated exposure control, adjustment of the mA and/or kV according to patient size and/or use of iterative reconstruction technique. COMPARISON:  Head CT 11/05/2022.  Cervical spine CT 04/01/2020. FINDINGS: CT HEAD FINDINGS Brain: No acute hemorrhage. Unchanged chronic small-vessel disease. Cortical gray-white differentiation is otherwise preserved. Prominence of the ventricles and sulci within normal limits for age. No extra-axial  collection. Basilar cisterns are patent. Vascular: No hyperdense vessel or unexpected calcification. Skull: No calvarial fracture or suspicious bone lesion. Skull base is unremarkable. Sinuses/Orbits: Unremarkable. Other: None. CT CERVICAL SPINE FINDINGS Alignment: Normal. Skull base and vertebrae: No acute fracture. Normal craniocervical junction. No suspicious bone lesions. Soft tissues and spinal canal: No prevertebral fluid or swelling. No visible canal hematoma. Disc levels: Unchanged multilevel cervical spondylosis with calcified disc osteophyte complexes causing at least moderate spinal canal stenosis at C5-6 and C6-7. Upper chest: Unremarkable. Other: Unchanged multinodular thyroid goiter, better evaluated on prior thyroid ultrasound. IMPRESSION: 1. No acute intracranial abnormality. 2. No acute cervical spine fracture or traumatic listhesis. 3. Unchanged multilevel cervical spondylosis with at least moderate spinal canal stenosis at C5-6 and C6-7. Electronically Signed   By: Emmit Alexanders M.D.   On: 11/16/2022 13:03    ASSESSMENT/PLAN:  Assessment: Principal Problem:   Fall   KANETRA MCKENNA is a 89 y.o. with pertinent PMH of HTN, HLD, T2DM, CVA (10/2019), complete heart block (s/p pacemaker placement 2003 with generator change 04/2019), HFpEF (Echo 09/2016 EF 50-55% with G1DD), breast cancer (2015), OA, GERD, Alzheimer Dementia, and HSV who presents from her skilled nursing facility [facility]) after an unwitnessed fall and admitted for elevated troponins and further evaluation. Currently on hospital day 1.  Plan: #Fall Decreased fluid intake at SNF per daughter. Patient reports feeling dizzy prior to falling. Fall was unwitnessed therefore cause is uncertain. No findings of trauma on imaging. Appears dry on exam, dry mucous membranes and decreased skin turgor. Differential includes arrhythmia, orthostatic hypotension, mechanical. ICD interrogation does not demonstrate abnormalities at the time  of her fall. Patient still intermittently confused and somewhat somnolent. Afebrile without leukocytosis. Normal lung sounds. Low suspicion for infection at this time. Patient was unable to stand today for orthostatic vital signs. Suspect likely orthostatic hypotension. Will administer IV fluids today and reassess orthostatic vital signs. Will get her up working with PT/OT.  -Continue to encourage PO intake, supervised feeds -IV LR 1L bolus 1x -Orthostatic VS -PT/OT eval -Full liquid diet -Delirium precautions  #NSTEMI, type 2 Elevated troponin, but downtrending. Potentially in the setting of hypovolemia. No ischemic changes on EKG. Cardiology was consulted by ED physician. Reached out to cardiology today, states NTD.  -Telemetry   #  HFpEF Echo 09/2016 EF 50-55% w/ G1DD. No signs of fluid overload on exam. Will give gentle fluids given history of HFpEF. -Strict I's and O's -Daily weights  #Shoulder and foot pain Likely 2/2 OA. Will start voltaren gel. -Tylenol PRN -Start Voltaren gel    #T2DM A1c 6.0% 5 months ago. On glimepiride 1 mg BID and metformin 1000 mg daily at home. CBGs WNL on admission.  -Consider starting SSI once patient eating more   #Hx of CVA (10/2019) #HLD On Aspirin 81 mg daily and Rosuvastatin 5 mg daily at home. -Continue aspirin 81 mg daily -Continue rosuvastatin 5 mg daily   #OA On Tylenol 650 mg (2X BID) and Meloxicam 7.5 mg daily PRN at home.  -Tylenol 650 mg q6 PRN   #GERD Taking Pantoprazole 20 mg daily at home.  -Continue home Pantoprazole 20 mg daily    #Hx of breast cancer (2015) Hx of right breast benign lumpectomy. Mammogram from 10/2013 demonstrates no evidence of malignancy.   #HSV On valacyclovir 1000 mg PRN at home. No complaints of flare at this time. Will hold for now.  -Hold valacyclovir 1000 mg PRN   #Dispo Recurrent hospitalizations for AMS recently. Will likely need SNF again at discharge. Patient's daughter would like for patient  to stay at [facility] after discharge. -Appreciate TOC help     Diet:  Full liquids VTE: SCDs IVF: LR Code: DNR PT/OT recs: pending   Prior to Admission Living Arrangement: SNF Anticipated Discharge Location: SNF Barriers to Discharge: continued management Dispo: Anticipated discharge in approximately less than 2 day(s).      Signature: Marisa Cyphers, Medical Student   Please contact the on call pager after 5 pm and on weekends at [phone].  Attestation for Student Documentation:  I personally was present and performed or re-performed the history, physical exam and medical decision-making activities of this service and have verified that the service and findings are accurately documented in the student's note.  Starlyn Skeans, MD 11/17/2022, 2:20 PM

## 2022-11-17 NOTE — Care Management Obs Status (Signed)
[place] NOTIFICATION   Patient Details  Name: YAARA TEUSCHER MRN: [id number] Date of Birth: 08/20/1924   Medicare Observation Status Notification Given:  Yes    Carles Collet, RN 11/17/2022, 2:22 PM

## 2022-11-17 NOTE — Evaluation (Signed)
Occupational Therapy Evaluation Patient Details Name: Maria [name] MRN: [id number] DOB: [birth year] Today's Date: 11/17/2022   History of Present Illness 89 yo person living with dementia, hypertension, hyperlipidemia, CVA, complete heart block s/p pacemaker, HFpEF, and NIDDM recently discharged to SNF from our service following treatment for UTI. Unfortunately, patient experienced an unwitnessed fall at the SNF on the night prior to this admissio   Clinical Impression   Pt presents with decline in function and safety with ADLs and ADL mobility with impaired strength, balance and endurance; hx of dementia/cognitive impairments. PTA pt was at [facility] for [place] rehab, had a fall and was sent out to hospital. Family planning for pt to return to SNF to continue to skilled rehab services. Pt would benefit from acute OT services to maximize level of function and safety to return to SNF     Recommendations for follow up therapy are one component of a multi-disciplinary discharge planning process, led by the attending physician.  Recommendations may be updated based on patient status, additional functional criteria and insurance authorization.   Follow Up Recommendations  Skilled nursing-short term rehab (<3 hours/day)     Assistance Recommended at Discharge Frequent or constant Supervision/Assistance  Patient can return home with the following Two people to help with walking and/or transfers;A lot of help with bathing/dressing/bathroom;Assistance with cooking/housework;Direct supervision/assist for medications management;Direct supervision/assist for financial management;Assist for transportation;Help with stairs or ramp for entrance    Functional Status Assessment  Patient has had a recent decline in their functional status and demonstrates the ability to make significant improvements in function in a reasonable and predictable amount of time.  Equipment Recommendations  Other (comment) (TBD  at SNF)    Recommendations for Other Services       Precautions / Restrictions Precautions Precautions: Fall Restrictions Weight Bearing Restrictions: No      Mobility Bed Mobility Overal bed mobility: Needs Assistance Bed Mobility: Supine to Sit, Rolling Rolling: Total assist, +2 for physical assistance   Supine to sit: Total assist, +2 for physical assistance     General bed mobility comments: use of pad for rolling to bring BLE's into and out of bed and to elevate trunk    Transfers Overall transfer level: Needs assistance Equipment used: Rolling walker (2 wheels)   Sit to Stand: Mod assist, +2 physical assistance Stand pivot transfers: Mod assist, +2 physical assistance   Step pivot transfers: Mod assist, +2 physical assistance     General transfer comment: max A to manage RW BSC to recliner      Balance Overall balance assessment: Needs assistance Sitting-balance support: Bilateral upper extremity supported, Feet supported Sitting balance-Leahy Scale: Fair     Standing balance support: During functional activity, Bilateral upper extremity supported Standing balance-Leahy Scale: Poor Standing balance comment: forward leaning posture                           ADL either performed or assessed with clinical judgement   ADL Overall ADL's : Needs assistance/impaired Eating/Feeding: Set up;Sitting   Grooming: Wash/dry face;Wash/dry hands;Min guard;Sitting   Upper Body Bathing: Moderate assistance Upper Body Bathing Details (indicate cue type and reason): simulated Lower Body Bathing: Maximal assistance Lower Body Bathing Details (indicate cue type and reason): simulated Upper Body Dressing : Minimal assistance;Sitting       Toilet Transfer: Moderate assistance;+2 for physical assistance;BSC/3in1;Cueing for safety;Cueing for sequencing;Rolling walker (2 wheels)   Toileting- Clothing Manipulation and Hygiene:  Total assistance;Sit to/from stand        Functional mobility during ADLs: Moderate assistance;+2 for physical assistance;Rolling walker (2 wheels);Cueing for safety;Cueing for sequencing       Vision Baseline Vision/History: 1 Wears glasses Ability to See in Adequate Light: 0 Adequate Patient Visual Report: No change from baseline       Perception     Praxis      Pertinent Vitals/Pain Pain Assessment Pain Assessment: Faces Faces Pain Scale: Hurts little more Pain Location: buttocks and periareas Pain Descriptors / Indicators: Burning Pain Intervention(s): Limited activity within patient's tolerance, Monitored during session, Repositioned     Hand Dominance Right   Extremity/Trunk Assessment Upper Extremity Assessment Upper Extremity Assessment: Generalized weakness   Lower Extremity Assessment Lower Extremity Assessment: Defer to PT evaluation   Cervical / Trunk Assessment Cervical / Trunk Assessment: Kyphotic   Communication     Cognition Arousal/Alertness: Awake/alert Behavior During Therapy: Flat affect Overall Cognitive Status: No family/caregiver present to determine baseline cognitive functioning Area of Impairment: Orientation, Attention, Memory, Following commands, Safety/judgement, Awareness, Problem solving                 Orientation Level: Disoriented to, Time, Situation   Memory: Decreased short-term memory Following Commands: Follows one step commands inconsistently, Follows one step commands with increased time Safety/Judgement: Decreased awareness of deficits, Decreased awareness of safety   Problem Solving: Slow processing, Decreased initiation, Difficulty sequencing, Requires verbal cues, Requires tactile cues       General Comments       Exercises     Shoulder Instructions      Home Living Family/patient expects to be discharged to:: Skilled nursing facility Living Arrangements: Alone;Children Available Help at Discharge: Family;Available  PRN/intermittently Type of Home: House       Home Layout: One level     Bathroom Shower/Tub: Teacher, early years/pre: Standard     Home Equipment: Conservation officer, nature (2 wheels)          Prior Functioning/Environment               Mobility Comments: RW and has assist for transfers ADLs Comments: grandson & daughter assist, has aide from 10am-1pm daily to assist with ADLs        OT Problem List: Decreased strength;Decreased range of motion;Decreased activity tolerance;Decreased cognition;Decreased safety awareness      OT Treatment/Interventions: Self-care/ADL training;Therapeutic exercise;Energy conservation;DME and/or AE instruction;Therapeutic activities;Patient/family education;Balance training;Cognitive remediation/compensation    OT Goals(Current goals can be found in the care plan section) Acute Rehab OT Goals Patient Stated Goal: none stated OT Goal Formulation: With patient Time For Goal Achievement: 12/01/22 Potential to Achieve Goals: Good ADL Goals Pt Will Perform Grooming: with supervision;with set-up;sitting Pt Will Perform Upper Body Bathing: with min assist;with min guard assist Pt Will Perform Lower Body Bathing: with mod assist;sitting/lateral leans Pt Will Perform Upper Body Dressing: with min assist;with min guard assist Pt Will Transfer to Toilet: stand pivot transfer;ambulating;bedside commode;grab bars  OT Frequency: Min 2X/week    Co-evaluation              AM-PAC OT "6 Clicks" Daily Activity     Outcome Measure Help from another person eating meals?: A Little Help from another person taking care of personal grooming?: A Little Help from another person toileting, which includes using toliet, bedpan, or urinal?: Total Help from another person bathing (including washing, rinsing, drying)?: A Lot Help from another person to put on and taking off  regular upper body clothing?: A Lot Help from another person to put on and taking  off regular lower body clothing?: Total 6 Click Score: 12   End of Session Equipment Utilized During Treatment: Gait belt;Rolling walker (2 wheels);Other (comment) (BSC)  Activity Tolerance: Patient limited by fatigue Patient left: with call bell/phone within reach;in chair;with chair alarm set  OT Visit Diagnosis: Unsteadiness on feet (R26.81);Other abnormalities of gait and mobility (R26.89);Muscle weakness (generalized) (M62.81)                Time: [id number] OT Time Calculation (min): 29 min Charges:  OT General Charges $OT Visit: 1 Visit OT Evaluation $OT Eval Moderate Complexity: 1 Mod    Britt Bottom 11/17/2022, 3:17 PM

## 2022-11-18 DIAGNOSIS — W19XXXA Unspecified fall, initial encounter: Secondary | ICD-10-CM | POA: Diagnosis not present

## 2022-11-18 DIAGNOSIS — I951 Orthostatic hypotension: Secondary | ICD-10-CM | POA: Diagnosis not present

## 2022-11-18 LAB — BASIC METABOLIC PANEL
Anion gap: 9 (ref 5–15)
BUN: 12 mg/dL (ref 8–23)
CO2: 23 mmol/L (ref 22–32)
Calcium: 9 mg/dL (ref 8.9–10.3)
Chloride: 107 mmol/L (ref 98–111)
Creatinine, Ser: 0.63 mg/dL (ref 0.44–1.00)
GFR, Estimated: >60 mL/min (ref >60)
Glucose, Bld: 147 mg/dL — ABNORMAL HIGH (ref 70–99)
Potassium: 3.7 mmol/L (ref 3.5–5.1)
Sodium: 139 mmol/L (ref 135–145)

## 2022-11-18 NOTE — TOC Transition Note (Signed)
Transition of Care [facility]) - CM/SW Discharge Note   Patient Details  Name: Maria [name] MRN: [id number] Date of Birth: [birth year]  Transition of Care [facility]) CM/SW Contact:  Coralee Pesa, [place] Phone Number: 11/18/2022, 1:58 PM   Clinical Narrative:    Pt to be transported to [facility] via [place]. [place] to call report to [phone].   Final next level of care: Skilled Nursing Facility Barriers to Discharge: Barriers Resolved   Patient Goals and CMS Choice CMS Medicare.gov Compare Post Acute Care list provided to:: Patient Represenative (must comment) (Daughter) Choice offered to / list presented to : Adult Children  Discharge Placement                Patient chooses bed at: [facility] Patient to be transferred to facility by: [place] Name of family member notified: VM left for dtr [name] Patient and family notified of of transfer: 11/18/22  Discharge Plan and Services Additional resources added to the After Visit Summary for       Post Acute Care Choice: [place]                               Social Determinants of Health (SDOH) Interventions SDOH Screenings   Food Insecurity: No Food Insecurity (11/06/2022)  Housing: Low Risk  (07/26/2022)  Transportation Needs: No Transportation Needs (11/06/2022)  Utilities: Not At Risk (11/06/2022)  Financial Resource Strain: Low Risk  (08/02/2022)  Tobacco Use: High Risk (11/05/2022)     Readmission Risk Interventions     No data to display

## 2022-11-18 NOTE — NC FL2 (Signed)
[place] LEVEL OF CARE FORM     IDENTIFICATION  Patient Name: Maria [name] Birthdate: 18-Jul-1924 Sex: female Admission Date (Current Location): 11/16/2022  [facility] and [place] Number:  Herbalist and Address:  The . [facility], [place] [address] [place], [place] [address]      Provider Number: [id number]  Attending Physician Name and Address:  Charise Killian, MD  Relative Name and Phone Number:  Gwenlyn Saran [phone]    Current Level of Care: Hospital Recommended Level of Care: [place] Prior Approval Number:    Date Approved/Denied:   PASRR Number: [id number]  Discharge Plan: SNF    Current Diagnoses: Patient Active Problem List   Diagnosis Date Noted   Orthostatic hypotension 11/17/2022   Fall 11/16/2022   Urinary tract infection 11/05/2022   Positive RPR test 07/08/2022   Pneumonia of right lung due to infectious organism 06/03/2022   Alzheimer's dementia with behavioral disturbance ([place]) 06/03/2022   Prolonged QT interval XX123456   Toxic metabolic encephalopathy XX123456   Acute metabolic encephalopathy 123456   Acute cystitis without hematuria 06/02/2022   Chronic diastolic CHF (congestive heart failure) ([place]) 06/02/2022   Mixed diabetic hyperlipidemia associated with type 2 diabetes mellitus ([place]) 06/02/2022   GERD without esophagitis 06/02/2022   At risk for falls 03/11/2021   Bilateral hearing loss 03/11/2021   Dementia with behavioral disturbance ([place]) 03/11/2021   Depressive disorder 03/11/2021   Pulmonary hypertension ([place]) 03/11/2021   Insomnia 12/23/2020   Dysphagia, unspecified 12/23/2020   Primary generalized (osteo)arthritis 12/15/2020   Occlusion and stenosis of unspecified carotid artery 09/13/2020   Long term (current) use of oral hypoglycemic drugs 09/13/2020   History of falling 09/13/2020   Retention of urine, unspecified 09/13/2020   Unspecified urinary incontinence  09/13/2020   Severe sepsis ([place]) 08/08/2020   Acute kidney injury ([place]) 08/08/2020   Acute UTI (urinary tract infection) 08/08/2020   Carotid atherosclerosis 05/29/2020   Multinodular goiter 05/29/2020   Multiple lacunar infarcts (HCC) 05/29/2020   Abnormal serum thyroid stimulating hormone (TSH) level 05/28/2020   Constipation 11/27/2019   Impaired cognition 11/27/2019   Elevated lactic acid level 10/17/2019   Generalized weakness 10/17/2019   Hypokalemia 10/17/2019   Dehydration 10/17/2019   Acute encephalopathy 10/16/2019   Heart block AV complete ([place]) 04/13/2019   Normocytic anemia 11/01/2018   Diverticulosis of colon 07/22/2018   Mixed hyperlipidemia 11/22/2017   Nephropathy due to secondary diabetes mellitus ([place]) 11/22/2017   Polyneuropathy due to type 2 diabetes mellitus ([place]) 06/30/2017   Acute acalculous cholecystitis 10/07/2016   Elevated troponin 10/07/2016   Lactic acidosis 10/07/2016   Sinus tachycardia 10/07/2016   Dyslipidemia 06/05/2013   Cardiac pacemaker 05/30/2013   Type 2 diabetes mellitus with diabetic polyneuropathy, without long-term current use of insulin ([place]) 05/21/2013   Chest pain 05/20/2013   Essential hypertension 05/20/2013    Orientation RESPIRATION BLADDER Height & Weight     Self, Place  Normal Continent, External catheter Weight: 139 lb 12.4 oz (63.4 kg) Height:     BEHAVIORAL SYMPTOMS/MOOD NEUROLOGICAL BOWEL NUTRITION STATUS      Continent Diet (See DC summary)  AMBULATORY STATUS COMMUNICATION OF NEEDS Skin   Extensive Assist Verbally Normal                       Personal Care Assistance Level of Assistance  Bathing, Feeding, Dressing Bathing Assistance: Maximum assistance Feeding assistance: Limited assistance Dressing Assistance:  Maximum assistance     Functional Limitations Info  Sight, Hearing, Speech Sight Info: Impaired Hearing Info: Adequate Speech Info: Adequate    SPECIAL CARE FACTORS FREQUENCY        PT  Frequency: 5x week OT Frequency: 5x week            Contractures Contractures Info: Not present    Additional Factors Info  Code Status, Allergies Code Status Info: DNR Allergies Info: Vancomycin           Current Medications (11/18/2022):  This is the current hospital active medication list Current Facility-Administered Medications  Medication Dose Route Frequency Provider Last Rate Last Admin   acetaminophen (TYLENOL) tablet 650 mg  650 mg Oral Q6H PRN Mapp, Tavien, MD   650 mg at 11/18/22 0944   Or   acetaminophen (TYLENOL) suppository 650 mg  650 mg Rectal Q6H PRN Mapp, Tavien, MD       aspirin EC tablet 81 mg  81 mg Oral Daily Mapp, Tavien, MD   81 mg at 11/18/22 0944   diclofenac Sodium (VOLTAREN) 1 % topical gel 2 g  2 g Topical QID Delene Ruffini, MD   2 g at 11/18/22 0944   enoxaparin (LOVENOX) injection 40 mg  40 mg Subcutaneous Daily Mapp, Tavien, MD   40 mg at 11/18/22 0943   pantoprazole (PROTONIX) EC tablet 20 mg  20 mg Oral Daily Mapp, Tavien, MD   20 mg at 11/18/22 0944   rosuvastatin (CRESTOR) tablet 5 mg  5 mg Oral QHS Mapp, Tavien, MD   5 mg at 11/17/22 2222   white petrolatum (VASELINE) gel   Topical PRN Mapp, Claudia Desanctis, MD         Discharge Medications: Please see discharge summary for a list of discharge medications.  Relevant Imaging Results:  Relevant Lab Results:   Additional Information SS#: [id number], LCSWA

## 2022-11-18 NOTE — Progress Notes (Signed)
[name] to be D/C'd  per MD order. Given report to [place], LPN at [facility]. Discussed with the patient and all questions fully answered.  VSS, Skin clean, dry and intact without evidence of skin break down, no evidence of skin tears noted.  IV catheter discontinued intact. Site without signs and symptoms of complications. Dressing and pressure applied.  An After Visit Summary was printed and given to the [place].  Patient instructed to return to ED, call 911, or call MD for any changes in condition.

## 2022-11-18 NOTE — Discharge Summary (Addendum)
Name: Maria [name] MRN: [id number] DOB: Sep 02, 1924 89 y.o. PCP: Audley Hose, MD  Date of Admission: 11/16/2022 11:12 AM Date of Discharge: 11/17/2022 Attending Physician: Charise Killian, MD  Discharge Diagnosis: 1. Principal Problem:   Orthostatic hypotension Active Problems:   Fall   NSTEMI   HFpEF   T2DM   History of CVA   History of breast cancer   Hyperlipidemia   Osteoarthritis    GERD   HSV  Discharge Medications: Allergies as of 11/18/2022       Reactions   Vancomycin Itching        Medication List     TAKE these medications    acetaminophen 500 MG tablet Commonly known as: TYLENOL Take 500 mg by mouth every 6 (six) hours as needed for mild pain.   aspirin EC 81 MG tablet Take 81 mg by mouth daily.   Biofreeze 4 % Gel Generic drug: Menthol (Topical Analgesic) Apply 2 g topically 2 (two) times daily as needed (for painful muscles).   donepezil 10 MG tablet Commonly known as: ARICEPT Take 10 mg by mouth at bedtime.   latanoprost 0.005 % ophthalmic solution Commonly known as: XALATAN Place 1 drop into both eyes at bedtime.   losartan 50 MG tablet Commonly known as: COZAAR Take 50 mg by mouth daily.   meloxicam 7.5 MG tablet Commonly known as: MOBIC Take 7.5 mg by mouth daily.   metFORMIN 1000 MG tablet Commonly known as: GLUCOPHAGE Take 0.5 tablets (500 mg total) by mouth daily with breakfast for 7 days, THEN 1 tablet (1,000 mg total) daily with breakfast for 23 days. Continue taking 1 tablet (1000 mg daily) until you are reassessed by your primary care provider.. Start taking on: November 11, 2022 What changed: Another medication with the same name was removed. Continue taking this medication, and follow the directions you see here.   nitroGLYCERIN 0.4 MG SL tablet Commonly known as: NITROSTAT Place 0.4 mg under the tongue every 5 (five) minutes as needed for chest pain.   pantoprazole 20 MG tablet Commonly known as: PROTONIX Take 1  tablet (20 mg total) by mouth daily.   PRESERVISION AREDS 2+MULTI VIT PO Take 1 capsule by mouth daily.   Refresh Optive PF 0.5-0.9 % Soln Generic drug: Carboxymethylcell-Glycerin PF Place 1 drop into both eyes daily.   Restasis 0.05 % ophthalmic emulsion Generic drug: cycloSPORINE Place 1 drop into both eyes daily.   rosuvastatin 5 MG tablet Commonly known as: CRESTOR Take 5 mg by mouth at bedtime.        Disposition and follow-up:   Maria [name] was discharged from [facility] in Good condition.  At the hospital follow up visit please address:  1.    A. Fall - Suspect orthostatic hypotension 2/2 decreased PO intake. Please encourage increased intake of fluids as outpatient, may also need assisting with feeding.   B. NSTEMI, type 2  - Likely in the setting of hypovolemia  2.  Labs / imaging needed at time of follow-up: CMP  3.  Pending labs/ test needing follow-up: none  Follow-up Appointments: - Please follow up with your primary care provider Audley Hose, MD at the [facility] (please call to schedule an appointment) Phone: [phone] Address: [phone], [place], [place], [place] [address]  Hospital Course by problem list:  Maria [name] is a 89 y.o. with pertinent PMH of HTN, HLD, T2DM, CVA (10/2019), complete heart block (  s/p pacemaker placement 2003 with generator change 04/2019), HFpEF (Echo 09/2016 EF 50-55% with G1DD), breast cancer (2015), OA, GERD, Alzheimer Dementia, and HSV who presents from her skilled nursing facility [facility]) after an unwitnessed fall.  #Fall Patient presented from SNF after an unwitnessed fall. She reported dizziness prior to falling. Patient felt somewhat tired on admission. Had decreased fluid intake at SNF per daughter. Imaging studies were negative for fractures or acute changes. On exam, patient was very dry w/ dry mucous membranes and decreased skin turgor. ICD interrogation  does not demonstrate abnormalities at the time of her fall. Had no signs of infection and remained afebrile without leukocytosis. Initially, patient was unable to complete orthostatic vital signs due to generalized weakness. Administered IV fluids. Assisted patient with feeding. Repeat orthostatic vital signs were normal. Suspect her fall was likely in the setting of decreased PO intake at her SNF. We encourage patient and SNF staff to increase PO intake. She will likely need help with feeding herself.   #NSTEMI, type 2 Elevated troponin on admission, but flat. Suspected to be secondary to hypovolemia in the setting of decreased PO intake. No ischemic changes on EKG. Cardiology was consulted by ED physician. Per cardiology, nothing to do. Recommend increased PO intake as an outpatient (may need help with feeding).    #HFpEF Echo 09/2016 EF 50-55% w/ G1DD. No signs of fluid overload on exam. Will give gentle fluids given history of HFpEF. -Strict I's and O's -Daily weights   #T2DM A1c 6.0% 5 months ago. Was taking metformin 1000 mg daily at home. CBGs remained WNL while hospitalized. Continued home metformin at discharge.    #Hx of CVA (10/2019) #HLD Continued home Aspirin 81 mg daily and Rosuvastatin 5 mg daily at home throughout her hospitalization and at discharge.   #OA Home medications prior to admission included Tylenol 650 mg (2X BID) and Meloxicam 7.5 mg daily PRN.  Continued these medications at discharge.   #GERD Was taking Pantoprazole 20 mg daily at home.  Continued this medication throughout her hospitalization and at discharge.   #Hx of breast cancer (2015) Hx of right breast benign lumpectomy. Mammogram from 10/2013 demonstrates no evidence of malignancy.   #HSV Was taking valacyclovir 1000 mg PRN at home. Continued this medication at discharge.    Discharge Exam:   BP (!) 157/73 (BP Location: Left Arm)   Pulse 60   Temp 98 F (36.7 C) (Oral)   Resp 18   Wt 63.4 kg    SpO2 100%   BMI 23.99 kg/m  Constitutional: elderly female resting in bed, thin, not in acute distress, responding to questions appropriately HENT: normocephalic atraumatic, mucous membranes dry Cardiovascular: regular rate and rhythm, no m/r/g. Normal radial and DP pulses. No LE edema. Pulmonary/Chest: normal work of breathing on room air, lungs clear to auscultation bilaterally Abdominal: soft, non-tender, non-distended MSK: normal range of motion, moving all extremities spontaneously Neurological: alert & oriented to person, place, situation, but not time Skin: warm and dry, skin turgor decreased Psych: normal mood and affect.  Pertinent Labs, Studies, and Procedures:     Latest Ref Rng & Units 11/16/2022   12:46 PM 11/10/2022    4:32 AM 11/06/2022   12:42 AM  CBC  WBC 4.0 - 10.5 K/uL 7.9  8.3  7.5   Hemoglobin 12.0 - 15.0 g/dL 14.7  12.6  14.1   Hematocrit 36.0 - 46.0 % 44.7  38.3  42.2   Platelets 150 - 400 K/uL 294  231  257        Latest Ref Rng & Units 11/18/2022    3:49 AM 11/17/2022    3:37 AM 11/16/2022   12:46 PM  BMP  Glucose 70 - 99 mg/dL 147  135  160   BUN 8 - 23 mg/dL '12  18  16   '$ Creatinine 0.44 - 1.00 mg/dL 0.63  0.81  1.00   Sodium 135 - 145 mmol/L 139  140  142   Potassium 3.5 - 5.1 mmol/L 3.7  3.5  3.9   Chloride 98 - 111 mmol/L 107  105  102   CO2 22 - 32 mmol/L '23  24  22   '$ Calcium 8.9 - 10.3 mg/dL 9.0  9.1  9.7     CT Head Wo Contrast  IMPRESSION: 1. No acute intracranial abnormality. 2. No acute cervical spine fracture or traumatic listhesis. 3. Unchanged multilevel cervical spondylosis with at least moderate spinal canal stenosis at C5-6 and C6-7.   On: 11/16/2022 13:03   CT Cervical Spine Wo Contrast  IMPRESSION: 1. No acute intracranial abnormality. 2. No acute cervical spine fracture or traumatic listhesis. 3. Unchanged multilevel cervical spondylosis with at least moderate spinal canal stenosis at C5-6 and C6-7.   On: 11/16/2022 13:03   DG  Chest 1 View  IMPRESSION: There are no signs of pulmonary edema or focal pulmonary consolidation. Left lateral CP angle is indistinct which may suggest pleural thickening or minimal effusion.   On: 11/16/2022 13:50   DG Shoulder Right  IMPRESSION: No recent fracture is seen. Degenerative changes are noted in right shoulder and right AC joints.   On: 11/16/2022 13:51   Discharge Instructions: Discharge Instructions     Call MD for:  difficulty breathing, headache or visual disturbances   Complete by: As directed    Call MD for:  extreme fatigue   Complete by: As directed    Call MD for:  persistant dizziness or light-headedness   Complete by: As directed    Diet - low sodium heart healthy   Complete by: As directed    Increase activity slowly   Complete by: As directed       You were hospitalized for a fall.    Hospital Course: We believe that you fell due to being dehydrated. We gave you IV fluids to help rehydrate you. We got you up and working with the physical therapists and occupational therapists. Please continue to hydrate well at your skilled nursing facility. Please assist the patient with feeding and ensure she has adequate hydration.      Physical Therapy/Occupational Therapy: you will go back to the skilled nursing facility to build up your strength before going home.    Follow-up: - Please follow up with your primary care provider Audley Hose, MD at the [facility] (please call to schedule an appointment) Phone: [phone] Address: [phone] [address] [place], [place], [place] [address]  Signed: Starlyn Skeans, MD 11/18/2022, 1:54 PM   Pager: [phone]

## 2022-11-18 NOTE — Plan of Care (Signed)

## 2022-11-24 DIAGNOSIS — G309 Alzheimer's disease, unspecified: Secondary | ICD-10-CM | POA: Diagnosis not present

## 2022-11-24 DIAGNOSIS — I214 Non-ST elevation (NSTEMI) myocardial infarction: Secondary | ICD-10-CM | POA: Diagnosis not present

## 2022-11-24 DIAGNOSIS — Z950 Presence of cardiac pacemaker: Secondary | ICD-10-CM | POA: Diagnosis not present

## 2022-11-24 DIAGNOSIS — Z8719 Personal history of other diseases of the digestive system: Secondary | ICD-10-CM | POA: Diagnosis not present

## 2022-11-24 DIAGNOSIS — I951 Orthostatic hypotension: Secondary | ICD-10-CM | POA: Diagnosis not present

## 2022-11-24 DIAGNOSIS — Z853 Personal history of malignant neoplasm of breast: Secondary | ICD-10-CM | POA: Diagnosis not present

## 2022-11-24 DIAGNOSIS — Z87440 Personal history of urinary (tract) infections: Secondary | ICD-10-CM | POA: Diagnosis not present

## 2022-11-24 DIAGNOSIS — Z8679 Personal history of other diseases of the circulatory system: Secondary | ICD-10-CM | POA: Diagnosis not present

## 2022-11-24 DIAGNOSIS — M1990 Unspecified osteoarthritis, unspecified site: Secondary | ICD-10-CM | POA: Diagnosis not present

## 2022-11-24 DIAGNOSIS — Z7982 Long term (current) use of aspirin: Secondary | ICD-10-CM | POA: Diagnosis not present

## 2022-11-24 DIAGNOSIS — I110 Hypertensive heart disease with heart failure: Secondary | ICD-10-CM | POA: Diagnosis not present

## 2022-11-24 DIAGNOSIS — Z8619 Personal history of other infectious and parasitic diseases: Secondary | ICD-10-CM | POA: Diagnosis not present

## 2022-11-24 DIAGNOSIS — Z8673 Personal history of transient ischemic attack (TIA), and cerebral infarction without residual deficits: Secondary | ICD-10-CM | POA: Diagnosis not present

## 2022-11-24 DIAGNOSIS — I5032 Chronic diastolic (congestive) heart failure: Secondary | ICD-10-CM | POA: Diagnosis not present

## 2022-11-24 DIAGNOSIS — B009 Herpesviral infection, unspecified: Secondary | ICD-10-CM | POA: Diagnosis not present

## 2022-11-24 DIAGNOSIS — F02C0 Dementia in other diseases classified elsewhere, severe, without behavioral disturbance, psychotic disturbance, mood disturbance, and anxiety: Secondary | ICD-10-CM | POA: Diagnosis not present

## 2022-11-24 DIAGNOSIS — Z9189 Other specified personal risk factors, not elsewhere classified: Secondary | ICD-10-CM | POA: Diagnosis not present

## 2022-11-24 DIAGNOSIS — E1159 Type 2 diabetes mellitus with other circulatory complications: Secondary | ICD-10-CM | POA: Diagnosis not present

## 2022-11-24 DIAGNOSIS — R9431 Abnormal electrocardiogram [ECG] [EKG]: Secondary | ICD-10-CM | POA: Diagnosis not present

## 2022-11-24 DIAGNOSIS — W19XXXA Unspecified fall, initial encounter: Secondary | ICD-10-CM | POA: Diagnosis not present

## 2022-11-29 DIAGNOSIS — R5381 Other malaise: Secondary | ICD-10-CM | POA: Diagnosis not present

## 2022-11-29 DIAGNOSIS — R41841 Cognitive communication deficit: Secondary | ICD-10-CM | POA: Diagnosis not present

## 2022-11-29 DIAGNOSIS — G9341 Metabolic encephalopathy: Secondary | ICD-10-CM | POA: Diagnosis not present

## 2022-11-29 DIAGNOSIS — R52 Pain, unspecified: Secondary | ICD-10-CM | POA: Diagnosis not present

## 2022-11-29 DIAGNOSIS — G309 Alzheimer's disease, unspecified: Secondary | ICD-10-CM | POA: Diagnosis not present

## 2022-11-29 DIAGNOSIS — R2681 Unsteadiness on feet: Secondary | ICD-10-CM | POA: Diagnosis not present

## 2022-11-29 DIAGNOSIS — N390 Urinary tract infection, site not specified: Secondary | ICD-10-CM | POA: Diagnosis not present

## 2022-11-29 DIAGNOSIS — R1312 Dysphagia, oropharyngeal phase: Secondary | ICD-10-CM | POA: Diagnosis not present

## 2022-12-01 DIAGNOSIS — R52 Pain, unspecified: Secondary | ICD-10-CM | POA: Diagnosis not present

## 2022-12-01 DIAGNOSIS — N390 Urinary tract infection, site not specified: Secondary | ICD-10-CM | POA: Diagnosis not present

## 2022-12-01 DIAGNOSIS — R41841 Cognitive communication deficit: Secondary | ICD-10-CM | POA: Diagnosis not present

## 2022-12-01 DIAGNOSIS — R5381 Other malaise: Secondary | ICD-10-CM | POA: Diagnosis not present

## 2022-12-01 DIAGNOSIS — G9341 Metabolic encephalopathy: Secondary | ICD-10-CM | POA: Diagnosis not present

## 2022-12-01 DIAGNOSIS — R1312 Dysphagia, oropharyngeal phase: Secondary | ICD-10-CM | POA: Diagnosis not present

## 2022-12-01 DIAGNOSIS — R2681 Unsteadiness on feet: Secondary | ICD-10-CM | POA: Diagnosis not present

## 2022-12-01 DIAGNOSIS — G309 Alzheimer's disease, unspecified: Secondary | ICD-10-CM | POA: Diagnosis not present

## 2022-12-06 DIAGNOSIS — G9341 Metabolic encephalopathy: Secondary | ICD-10-CM | POA: Diagnosis not present

## 2022-12-06 DIAGNOSIS — R1312 Dysphagia, oropharyngeal phase: Secondary | ICD-10-CM | POA: Diagnosis not present

## 2022-12-06 DIAGNOSIS — R5381 Other malaise: Secondary | ICD-10-CM | POA: Diagnosis not present

## 2022-12-06 DIAGNOSIS — R2681 Unsteadiness on feet: Secondary | ICD-10-CM | POA: Diagnosis not present

## 2022-12-06 DIAGNOSIS — R41841 Cognitive communication deficit: Secondary | ICD-10-CM | POA: Diagnosis not present

## 2022-12-06 DIAGNOSIS — G309 Alzheimer's disease, unspecified: Secondary | ICD-10-CM | POA: Diagnosis not present

## 2022-12-06 DIAGNOSIS — N390 Urinary tract infection, site not specified: Secondary | ICD-10-CM | POA: Diagnosis not present

## 2022-12-06 DIAGNOSIS — R52 Pain, unspecified: Secondary | ICD-10-CM | POA: Diagnosis not present

## 2022-12-07 DIAGNOSIS — L89153 Pressure ulcer of sacral region, stage 3: Secondary | ICD-10-CM | POA: Diagnosis not present

## 2022-12-08 DIAGNOSIS — R2681 Unsteadiness on feet: Secondary | ICD-10-CM | POA: Diagnosis not present

## 2022-12-08 DIAGNOSIS — F02B0 Dementia in other diseases classified elsewhere, moderate, without behavioral disturbance, psychotic disturbance, mood disturbance, and anxiety: Secondary | ICD-10-CM | POA: Diagnosis not present

## 2022-12-08 DIAGNOSIS — Z7189 Other specified counseling: Secondary | ICD-10-CM | POA: Diagnosis not present

## 2022-12-08 DIAGNOSIS — R5381 Other malaise: Secondary | ICD-10-CM | POA: Diagnosis not present

## 2022-12-08 DIAGNOSIS — R9431 Abnormal electrocardiogram [ECG] [EKG]: Secondary | ICD-10-CM | POA: Diagnosis not present

## 2022-12-08 DIAGNOSIS — R41841 Cognitive communication deficit: Secondary | ICD-10-CM | POA: Diagnosis not present

## 2022-12-08 DIAGNOSIS — N390 Urinary tract infection, site not specified: Secondary | ICD-10-CM | POA: Diagnosis not present

## 2022-12-08 DIAGNOSIS — G9341 Metabolic encephalopathy: Secondary | ICD-10-CM | POA: Diagnosis not present

## 2022-12-08 DIAGNOSIS — E119 Type 2 diabetes mellitus without complications: Secondary | ICD-10-CM | POA: Diagnosis not present

## 2022-12-08 DIAGNOSIS — Z7984 Long term (current) use of oral hypoglycemic drugs: Secondary | ICD-10-CM | POA: Diagnosis not present

## 2022-12-08 DIAGNOSIS — I951 Orthostatic hypotension: Secondary | ICD-10-CM | POA: Diagnosis not present

## 2022-12-08 DIAGNOSIS — Z87440 Personal history of urinary (tract) infections: Secondary | ICD-10-CM | POA: Diagnosis not present

## 2022-12-08 DIAGNOSIS — K5901 Slow transit constipation: Secondary | ICD-10-CM | POA: Diagnosis not present

## 2022-12-08 DIAGNOSIS — R52 Pain, unspecified: Secondary | ICD-10-CM | POA: Diagnosis not present

## 2022-12-08 DIAGNOSIS — Z8619 Personal history of other infectious and parasitic diseases: Secondary | ICD-10-CM | POA: Diagnosis not present

## 2022-12-08 DIAGNOSIS — R1312 Dysphagia, oropharyngeal phase: Secondary | ICD-10-CM | POA: Diagnosis not present

## 2022-12-08 DIAGNOSIS — G309 Alzheimer's disease, unspecified: Secondary | ICD-10-CM | POA: Diagnosis not present

## 2022-12-13 DIAGNOSIS — R2681 Unsteadiness on feet: Secondary | ICD-10-CM | POA: Diagnosis not present

## 2022-12-13 DIAGNOSIS — R5381 Other malaise: Secondary | ICD-10-CM | POA: Diagnosis not present

## 2022-12-13 DIAGNOSIS — R52 Pain, unspecified: Secondary | ICD-10-CM | POA: Diagnosis not present

## 2022-12-13 DIAGNOSIS — G9341 Metabolic encephalopathy: Secondary | ICD-10-CM | POA: Diagnosis not present

## 2022-12-13 DIAGNOSIS — R1312 Dysphagia, oropharyngeal phase: Secondary | ICD-10-CM | POA: Diagnosis not present

## 2022-12-13 DIAGNOSIS — N390 Urinary tract infection, site not specified: Secondary | ICD-10-CM | POA: Diagnosis not present

## 2022-12-13 DIAGNOSIS — R41841 Cognitive communication deficit: Secondary | ICD-10-CM | POA: Diagnosis not present

## 2022-12-13 DIAGNOSIS — G309 Alzheimer's disease, unspecified: Secondary | ICD-10-CM | POA: Diagnosis not present

## 2022-12-14 DIAGNOSIS — L89153 Pressure ulcer of sacral region, stage 3: Secondary | ICD-10-CM | POA: Diagnosis not present

## 2022-12-14 DIAGNOSIS — Z7984 Long term (current) use of oral hypoglycemic drugs: Secondary | ICD-10-CM | POA: Diagnosis not present

## 2022-12-14 DIAGNOSIS — I5032 Chronic diastolic (congestive) heart failure: Secondary | ICD-10-CM | POA: Diagnosis not present

## 2022-12-14 DIAGNOSIS — G309 Alzheimer's disease, unspecified: Secondary | ICD-10-CM | POA: Diagnosis not present

## 2022-12-14 DIAGNOSIS — F0280 Dementia in other diseases classified elsewhere without behavioral disturbance: Secondary | ICD-10-CM | POA: Diagnosis not present

## 2022-12-14 DIAGNOSIS — I110 Hypertensive heart disease with heart failure: Secondary | ICD-10-CM | POA: Diagnosis not present

## 2022-12-14 DIAGNOSIS — G479 Sleep disorder, unspecified: Secondary | ICD-10-CM | POA: Diagnosis not present

## 2022-12-14 DIAGNOSIS — E1159 Type 2 diabetes mellitus with other circulatory complications: Secondary | ICD-10-CM | POA: Diagnosis not present

## 2022-12-15 DIAGNOSIS — R52 Pain, unspecified: Secondary | ICD-10-CM | POA: Diagnosis not present

## 2022-12-15 DIAGNOSIS — N390 Urinary tract infection, site not specified: Secondary | ICD-10-CM | POA: Diagnosis not present

## 2022-12-15 DIAGNOSIS — R5381 Other malaise: Secondary | ICD-10-CM | POA: Diagnosis not present

## 2022-12-15 DIAGNOSIS — G309 Alzheimer's disease, unspecified: Secondary | ICD-10-CM | POA: Diagnosis not present

## 2022-12-15 DIAGNOSIS — R41841 Cognitive communication deficit: Secondary | ICD-10-CM | POA: Diagnosis not present

## 2022-12-15 DIAGNOSIS — G9341 Metabolic encephalopathy: Secondary | ICD-10-CM | POA: Diagnosis not present

## 2022-12-15 DIAGNOSIS — R1312 Dysphagia, oropharyngeal phase: Secondary | ICD-10-CM | POA: Diagnosis not present

## 2022-12-15 DIAGNOSIS — R2681 Unsteadiness on feet: Secondary | ICD-10-CM | POA: Diagnosis not present

## 2022-12-20 DIAGNOSIS — N390 Urinary tract infection, site not specified: Secondary | ICD-10-CM | POA: Diagnosis not present

## 2022-12-20 DIAGNOSIS — R41841 Cognitive communication deficit: Secondary | ICD-10-CM | POA: Diagnosis not present

## 2022-12-20 DIAGNOSIS — G9341 Metabolic encephalopathy: Secondary | ICD-10-CM | POA: Diagnosis not present

## 2022-12-20 DIAGNOSIS — R5381 Other malaise: Secondary | ICD-10-CM | POA: Diagnosis not present

## 2022-12-20 DIAGNOSIS — R52 Pain, unspecified: Secondary | ICD-10-CM | POA: Diagnosis not present

## 2022-12-20 DIAGNOSIS — G309 Alzheimer's disease, unspecified: Secondary | ICD-10-CM | POA: Diagnosis not present

## 2022-12-20 DIAGNOSIS — R2681 Unsteadiness on feet: Secondary | ICD-10-CM | POA: Diagnosis not present

## 2022-12-20 DIAGNOSIS — R1312 Dysphagia, oropharyngeal phase: Secondary | ICD-10-CM | POA: Diagnosis not present

## 2022-12-22 DIAGNOSIS — R5381 Other malaise: Secondary | ICD-10-CM | POA: Diagnosis not present

## 2022-12-22 DIAGNOSIS — R41841 Cognitive communication deficit: Secondary | ICD-10-CM | POA: Diagnosis not present

## 2022-12-22 DIAGNOSIS — R2681 Unsteadiness on feet: Secondary | ICD-10-CM | POA: Diagnosis not present

## 2022-12-22 DIAGNOSIS — N390 Urinary tract infection, site not specified: Secondary | ICD-10-CM | POA: Diagnosis not present

## 2022-12-22 DIAGNOSIS — G9341 Metabolic encephalopathy: Secondary | ICD-10-CM | POA: Diagnosis not present

## 2022-12-22 DIAGNOSIS — R1312 Dysphagia, oropharyngeal phase: Secondary | ICD-10-CM | POA: Diagnosis not present

## 2022-12-22 DIAGNOSIS — R52 Pain, unspecified: Secondary | ICD-10-CM | POA: Diagnosis not present

## 2022-12-22 DIAGNOSIS — G309 Alzheimer's disease, unspecified: Secondary | ICD-10-CM | POA: Diagnosis not present

## 2022-12-27 DIAGNOSIS — G309 Alzheimer's disease, unspecified: Secondary | ICD-10-CM | POA: Diagnosis not present

## 2022-12-27 DIAGNOSIS — R1312 Dysphagia, oropharyngeal phase: Secondary | ICD-10-CM | POA: Diagnosis not present

## 2022-12-27 DIAGNOSIS — G9341 Metabolic encephalopathy: Secondary | ICD-10-CM | POA: Diagnosis not present

## 2022-12-27 DIAGNOSIS — N390 Urinary tract infection, site not specified: Secondary | ICD-10-CM | POA: Diagnosis not present

## 2022-12-27 DIAGNOSIS — R41841 Cognitive communication deficit: Secondary | ICD-10-CM | POA: Diagnosis not present

## 2022-12-27 DIAGNOSIS — R2681 Unsteadiness on feet: Secondary | ICD-10-CM | POA: Diagnosis not present

## 2022-12-27 DIAGNOSIS — R52 Pain, unspecified: Secondary | ICD-10-CM | POA: Diagnosis not present

## 2022-12-27 DIAGNOSIS — R5381 Other malaise: Secondary | ICD-10-CM | POA: Diagnosis not present

## 2022-12-29 DIAGNOSIS — G9341 Metabolic encephalopathy: Secondary | ICD-10-CM | POA: Diagnosis not present

## 2022-12-29 DIAGNOSIS — R52 Pain, unspecified: Secondary | ICD-10-CM | POA: Diagnosis not present

## 2022-12-29 DIAGNOSIS — R41841 Cognitive communication deficit: Secondary | ICD-10-CM | POA: Diagnosis not present

## 2022-12-29 DIAGNOSIS — R1312 Dysphagia, oropharyngeal phase: Secondary | ICD-10-CM | POA: Diagnosis not present

## 2022-12-29 DIAGNOSIS — G309 Alzheimer's disease, unspecified: Secondary | ICD-10-CM | POA: Diagnosis not present

## 2022-12-29 DIAGNOSIS — N390 Urinary tract infection, site not specified: Secondary | ICD-10-CM | POA: Diagnosis not present

## 2022-12-29 DIAGNOSIS — R2681 Unsteadiness on feet: Secondary | ICD-10-CM | POA: Diagnosis not present

## 2022-12-29 DIAGNOSIS — R5381 Other malaise: Secondary | ICD-10-CM | POA: Diagnosis not present

## 2023-01-03 DIAGNOSIS — N390 Urinary tract infection, site not specified: Secondary | ICD-10-CM | POA: Diagnosis not present

## 2023-01-03 DIAGNOSIS — G309 Alzheimer's disease, unspecified: Secondary | ICD-10-CM | POA: Diagnosis not present

## 2023-01-03 DIAGNOSIS — F0280 Dementia in other diseases classified elsewhere without behavioral disturbance: Secondary | ICD-10-CM | POA: Diagnosis not present

## 2023-01-03 DIAGNOSIS — I5032 Chronic diastolic (congestive) heart failure: Secondary | ICD-10-CM | POA: Diagnosis not present

## 2023-01-03 DIAGNOSIS — Z7984 Long term (current) use of oral hypoglycemic drugs: Secondary | ICD-10-CM | POA: Diagnosis not present

## 2023-01-03 DIAGNOSIS — E119 Type 2 diabetes mellitus without complications: Secondary | ICD-10-CM | POA: Diagnosis not present

## 2023-01-03 DIAGNOSIS — I272 Pulmonary hypertension, unspecified: Secondary | ICD-10-CM | POA: Diagnosis not present

## 2023-01-03 DIAGNOSIS — R2681 Unsteadiness on feet: Secondary | ICD-10-CM | POA: Diagnosis not present

## 2023-01-03 DIAGNOSIS — K5901 Slow transit constipation: Secondary | ICD-10-CM | POA: Diagnosis not present

## 2023-01-03 DIAGNOSIS — R5381 Other malaise: Secondary | ICD-10-CM | POA: Diagnosis not present

## 2023-01-03 DIAGNOSIS — R41841 Cognitive communication deficit: Secondary | ICD-10-CM | POA: Diagnosis not present

## 2023-01-03 DIAGNOSIS — R1312 Dysphagia, oropharyngeal phase: Secondary | ICD-10-CM | POA: Diagnosis not present

## 2023-01-03 DIAGNOSIS — R52 Pain, unspecified: Secondary | ICD-10-CM | POA: Diagnosis not present

## 2023-01-03 DIAGNOSIS — G9341 Metabolic encephalopathy: Secondary | ICD-10-CM | POA: Diagnosis not present

## 2023-01-04 DIAGNOSIS — I110 Hypertensive heart disease with heart failure: Secondary | ICD-10-CM | POA: Diagnosis not present

## 2023-01-04 DIAGNOSIS — F0280 Dementia in other diseases classified elsewhere without behavioral disturbance: Secondary | ICD-10-CM | POA: Diagnosis not present

## 2023-01-04 DIAGNOSIS — R110 Nausea: Secondary | ICD-10-CM | POA: Diagnosis not present

## 2023-01-04 DIAGNOSIS — I5032 Chronic diastolic (congestive) heart failure: Secondary | ICD-10-CM | POA: Diagnosis not present

## 2023-01-04 DIAGNOSIS — E1159 Type 2 diabetes mellitus with other circulatory complications: Secondary | ICD-10-CM | POA: Diagnosis not present

## 2023-01-04 DIAGNOSIS — G309 Alzheimer's disease, unspecified: Secondary | ICD-10-CM | POA: Diagnosis not present

## 2023-01-05 ENCOUNTER — Ambulatory Visit (INDEPENDENT_AMBULATORY_CARE_PROVIDER_SITE_OTHER): Payer: Medicare Other | Admitting: Podiatry

## 2023-01-05 DIAGNOSIS — G309 Alzheimer's disease, unspecified: Secondary | ICD-10-CM | POA: Diagnosis not present

## 2023-01-05 DIAGNOSIS — G9341 Metabolic encephalopathy: Secondary | ICD-10-CM | POA: Diagnosis not present

## 2023-01-05 DIAGNOSIS — R5381 Other malaise: Secondary | ICD-10-CM | POA: Diagnosis not present

## 2023-01-05 DIAGNOSIS — B351 Tinea unguium: Secondary | ICD-10-CM

## 2023-01-05 DIAGNOSIS — R41841 Cognitive communication deficit: Secondary | ICD-10-CM | POA: Diagnosis not present

## 2023-01-05 DIAGNOSIS — N390 Urinary tract infection, site not specified: Secondary | ICD-10-CM | POA: Diagnosis not present

## 2023-01-05 DIAGNOSIS — R1312 Dysphagia, oropharyngeal phase: Secondary | ICD-10-CM | POA: Diagnosis not present

## 2023-01-05 DIAGNOSIS — R52 Pain, unspecified: Secondary | ICD-10-CM | POA: Diagnosis not present

## 2023-01-05 DIAGNOSIS — M79609 Pain in unspecified limb: Secondary | ICD-10-CM

## 2023-01-05 DIAGNOSIS — R2681 Unsteadiness on feet: Secondary | ICD-10-CM | POA: Diagnosis not present

## 2023-01-05 NOTE — Progress Notes (Signed)
   Chief Complaint  Patient presents with   Diabetes    Diabetic foot care, nail trim     SUBJECTIVE Patient with a history of diabetes mellitus presents to office today complaining of elongated, thickened nails that cause pain while ambulating in shoes.  Patient is unable to trim their own nails. Patient is here for further evaluation and treatment.  Past Medical History:  Diagnosis Date   Anxiety    Arthritis    Cancer [facility])    breast 2015   Cardiac pacemaker in situ    Chest pain    Dyslipidemia    Dyspnea    with exertion   Family history of adverse reaction to anesthesia    daughter slow to wake up   Gallstones    Headache    Hypertension    Macular degeneration    Positive RPR test 07/08/2022   Pulmonary hypertension (HCC)    PA peak pressure 81 mmHg by 09/2016 echo   Type II or unspecified type diabetes mellitus without mention of complication, not stated as uncontrolled    Wears partial dentures     Allergies  Allergen Reactions   Vancomycin Itching     OBJECTIVE General Patient is awake, alert, and oriented x 3 and in no acute distress. Derm Skin is dry and supple bilateral. Negative open lesions or macerations. Remaining integument unremarkable. Nails are tender, long, thickened and dystrophic with subungual debris, consistent with onychomycosis, 1-5 bilateral. No signs of infection noted. Vasc peripheral vascular disease.  Neuro light touch and protective threshold sensation diminished bilaterally.  Musculoskeletal Exam No symptomatic pedal deformities noted bilateral. Muscular strength within normal limits.  ASSESSMENT 1. Diabetes Mellitus w/ peripheral neuropathy 2.  Pain due to onychomycosis of toenails bilateral  PLAN OF CARE 1. Patient evaluated today. 2. Instructed to maintain good pedal hygiene and foot care. Stressed importance of controlling blood sugar.  3. Mechanical debridement of nails 1-5 bilaterally performed using a nail nipper. Filed  with dremel without incident.  4. Return to clinic in 3 mos.     Felecia Shelling, DPM [facility]  Dr. Felecia Shelling, DPM    [address] [place], [place] [address]                Office [phone]  Fax [phone]

## 2023-01-10 DIAGNOSIS — G309 Alzheimer's disease, unspecified: Secondary | ICD-10-CM | POA: Diagnosis not present

## 2023-01-10 DIAGNOSIS — R296 Repeated falls: Secondary | ICD-10-CM | POA: Diagnosis not present

## 2023-01-10 DIAGNOSIS — I5032 Chronic diastolic (congestive) heart failure: Secondary | ICD-10-CM | POA: Diagnosis not present

## 2023-01-10 DIAGNOSIS — R1312 Dysphagia, oropharyngeal phase: Secondary | ICD-10-CM | POA: Diagnosis not present

## 2023-01-10 DIAGNOSIS — R41841 Cognitive communication deficit: Secondary | ICD-10-CM | POA: Diagnosis not present

## 2023-01-10 DIAGNOSIS — Z7984 Long term (current) use of oral hypoglycemic drugs: Secondary | ICD-10-CM | POA: Diagnosis not present

## 2023-01-10 DIAGNOSIS — R52 Pain, unspecified: Secondary | ICD-10-CM | POA: Diagnosis not present

## 2023-01-10 DIAGNOSIS — R5381 Other malaise: Secondary | ICD-10-CM | POA: Diagnosis not present

## 2023-01-10 DIAGNOSIS — R2681 Unsteadiness on feet: Secondary | ICD-10-CM | POA: Diagnosis not present

## 2023-01-10 DIAGNOSIS — E1159 Type 2 diabetes mellitus with other circulatory complications: Secondary | ICD-10-CM | POA: Diagnosis not present

## 2023-01-10 DIAGNOSIS — I152 Hypertension secondary to endocrine disorders: Secondary | ICD-10-CM | POA: Diagnosis not present

## 2023-01-10 DIAGNOSIS — N390 Urinary tract infection, site not specified: Secondary | ICD-10-CM | POA: Diagnosis not present

## 2023-01-10 DIAGNOSIS — F0280 Dementia in other diseases classified elsewhere without behavioral disturbance: Secondary | ICD-10-CM | POA: Diagnosis not present

## 2023-01-10 DIAGNOSIS — G9341 Metabolic encephalopathy: Secondary | ICD-10-CM | POA: Diagnosis not present

## 2023-01-12 DIAGNOSIS — I951 Orthostatic hypotension: Secondary | ICD-10-CM | POA: Diagnosis not present

## 2023-01-12 DIAGNOSIS — Z8673 Personal history of transient ischemic attack (TIA), and cerebral infarction without residual deficits: Secondary | ICD-10-CM | POA: Diagnosis not present

## 2023-01-12 DIAGNOSIS — M1990 Unspecified osteoarthritis, unspecified site: Secondary | ICD-10-CM | POA: Diagnosis not present

## 2023-01-12 DIAGNOSIS — R296 Repeated falls: Secondary | ICD-10-CM | POA: Diagnosis not present

## 2023-01-12 DIAGNOSIS — R4586 Emotional lability: Secondary | ICD-10-CM | POA: Diagnosis not present

## 2023-01-12 DIAGNOSIS — Z7984 Long term (current) use of oral hypoglycemic drugs: Secondary | ICD-10-CM | POA: Diagnosis not present

## 2023-01-12 DIAGNOSIS — Z87440 Personal history of urinary (tract) infections: Secondary | ICD-10-CM | POA: Diagnosis not present

## 2023-01-12 DIAGNOSIS — R609 Edema, unspecified: Secondary | ICD-10-CM | POA: Diagnosis not present

## 2023-01-12 DIAGNOSIS — F02C0 Dementia in other diseases classified elsewhere, severe, without behavioral disturbance, psychotic disturbance, mood disturbance, and anxiety: Secondary | ICD-10-CM | POA: Diagnosis not present

## 2023-01-12 DIAGNOSIS — E119 Type 2 diabetes mellitus without complications: Secondary | ICD-10-CM | POA: Diagnosis not present

## 2023-01-12 DIAGNOSIS — Z8619 Personal history of other infectious and parasitic diseases: Secondary | ICD-10-CM | POA: Diagnosis not present

## 2023-01-12 DIAGNOSIS — I5032 Chronic diastolic (congestive) heart failure: Secondary | ICD-10-CM | POA: Diagnosis not present

## 2023-01-12 DIAGNOSIS — R011 Cardiac murmur, unspecified: Secondary | ICD-10-CM | POA: Diagnosis not present

## 2023-01-12 DIAGNOSIS — Z7982 Long term (current) use of aspirin: Secondary | ICD-10-CM | POA: Diagnosis not present

## 2023-01-12 DIAGNOSIS — G309 Alzheimer's disease, unspecified: Secondary | ICD-10-CM | POA: Diagnosis not present

## 2023-01-13 DIAGNOSIS — R4182 Altered mental status, unspecified: Secondary | ICD-10-CM | POA: Diagnosis not present

## 2023-01-13 DIAGNOSIS — H6123 Impacted cerumen, bilateral: Secondary | ICD-10-CM | POA: Diagnosis not present

## 2023-01-13 DIAGNOSIS — R0989 Other specified symptoms and signs involving the circulatory and respiratory systems: Secondary | ICD-10-CM | POA: Diagnosis not present

## 2023-01-13 DIAGNOSIS — I5032 Chronic diastolic (congestive) heart failure: Secondary | ICD-10-CM | POA: Diagnosis not present

## 2023-01-13 DIAGNOSIS — F0280 Dementia in other diseases classified elsewhere without behavioral disturbance: Secondary | ICD-10-CM | POA: Diagnosis not present

## 2023-01-13 DIAGNOSIS — E1159 Type 2 diabetes mellitus with other circulatory complications: Secondary | ICD-10-CM | POA: Diagnosis not present

## 2023-01-13 DIAGNOSIS — I517 Cardiomegaly: Secondary | ICD-10-CM | POA: Diagnosis not present

## 2023-01-13 DIAGNOSIS — G309 Alzheimer's disease, unspecified: Secondary | ICD-10-CM | POA: Diagnosis not present

## 2023-01-13 DIAGNOSIS — I110 Hypertensive heart disease with heart failure: Secondary | ICD-10-CM | POA: Diagnosis not present

## 2023-01-14 DIAGNOSIS — E119 Type 2 diabetes mellitus without complications: Secondary | ICD-10-CM | POA: Diagnosis not present

## 2023-01-19 ENCOUNTER — Ambulatory Visit (INDEPENDENT_AMBULATORY_CARE_PROVIDER_SITE_OTHER): Payer: Medicare Other

## 2023-01-19 DIAGNOSIS — I442 Atrioventricular block, complete: Secondary | ICD-10-CM

## 2023-01-20 LAB — CUP PACEART REMOTE DEVICE CHECK
Battery Remaining Longevity: 93 mo
Battery Voltage: 2.99 V
Brady Statistic AP VP Percent: 58.01 %
Brady Statistic AP VS Percent: 0 %
Brady Statistic AS VP Percent: 40.7 %
Brady Statistic AS VS Percent: 1.08 %
Brady Statistic RA Percent Paced: 6.15 %
Brady Statistic RV Percent Paced: 99.38 %
Date Time Interrogation Session: 20240509133518
Implantable Lead Connection Status: 753985
Implantable Lead Connection Status: 753985
Implantable Lead Implant Date: 20030311
Implantable Lead Implant Date: 20030311
Implantable Lead Location: 753859
Implantable Lead Location: 753860
Implantable Lead Model: 4092
Implantable Lead Model: 4592
Implantable Pulse Generator Implant Date: 20200803
Lead Channel Impedance Value: 342 Ohm
Lead Channel Impedance Value: 361 Ohm
Lead Channel Impedance Value: 418 Ohm
Lead Channel Impedance Value: 456 Ohm
Lead Channel Pacing Threshold Amplitude: 0.375 V
Lead Channel Pacing Threshold Amplitude: 0.75 V
Lead Channel Pacing Threshold Pulse Width: 0.4 ms
Lead Channel Pacing Threshold Pulse Width: 0.4 ms
Lead Channel Sensing Intrinsic Amplitude: 0.625 mV
Lead Channel Sensing Intrinsic Amplitude: 0.625 mV
Lead Channel Sensing Intrinsic Amplitude: 5.75 mV
Lead Channel Sensing Intrinsic Amplitude: 5.75 mV
Lead Channel Setting Pacing Amplitude: 1.5 V
Lead Channel Setting Pacing Amplitude: 2.5 V
Lead Channel Setting Pacing Pulse Width: 0.4 ms
Lead Channel Setting Sensing Sensitivity: 4 mV
Zone Setting Status: 755011
Zone Setting Status: 755011

## 2023-01-21 DIAGNOSIS — R1310 Dysphagia, unspecified: Secondary | ICD-10-CM | POA: Diagnosis not present

## 2023-01-21 DIAGNOSIS — G9341 Metabolic encephalopathy: Secondary | ICD-10-CM | POA: Diagnosis not present

## 2023-01-24 DIAGNOSIS — N1832 Chronic kidney disease, stage 3b: Secondary | ICD-10-CM | POA: Diagnosis not present

## 2023-01-24 DIAGNOSIS — R1310 Dysphagia, unspecified: Secondary | ICD-10-CM | POA: Diagnosis not present

## 2023-01-24 DIAGNOSIS — R7989 Other specified abnormal findings of blood chemistry: Secondary | ICD-10-CM | POA: Diagnosis not present

## 2023-01-24 DIAGNOSIS — G9341 Metabolic encephalopathy: Secondary | ICD-10-CM | POA: Diagnosis not present

## 2023-01-24 DIAGNOSIS — U071 COVID-19: Secondary | ICD-10-CM | POA: Diagnosis not present

## 2023-01-24 DIAGNOSIS — R058 Other specified cough: Secondary | ICD-10-CM | POA: Diagnosis not present

## 2023-01-24 DIAGNOSIS — F03C18 Unspecified dementia, severe, with other behavioral disturbance: Secondary | ICD-10-CM | POA: Diagnosis not present

## 2023-01-24 DIAGNOSIS — I5032 Chronic diastolic (congestive) heart failure: Secondary | ICD-10-CM | POA: Diagnosis not present

## 2023-01-24 DIAGNOSIS — F5101 Primary insomnia: Secondary | ICD-10-CM | POA: Diagnosis not present

## 2023-01-25 DIAGNOSIS — L89153 Pressure ulcer of sacral region, stage 3: Secondary | ICD-10-CM | POA: Diagnosis not present

## 2023-01-26 DIAGNOSIS — G9341 Metabolic encephalopathy: Secondary | ICD-10-CM | POA: Diagnosis not present

## 2023-01-26 DIAGNOSIS — R1310 Dysphagia, unspecified: Secondary | ICD-10-CM | POA: Diagnosis not present

## 2023-01-27 DIAGNOSIS — G9341 Metabolic encephalopathy: Secondary | ICD-10-CM | POA: Diagnosis not present

## 2023-01-27 DIAGNOSIS — R1310 Dysphagia, unspecified: Secondary | ICD-10-CM | POA: Diagnosis not present

## 2023-02-01 DIAGNOSIS — L89153 Pressure ulcer of sacral region, stage 3: Secondary | ICD-10-CM | POA: Diagnosis not present

## 2023-02-01 DIAGNOSIS — G9341 Metabolic encephalopathy: Secondary | ICD-10-CM | POA: Diagnosis not present

## 2023-02-01 DIAGNOSIS — R1310 Dysphagia, unspecified: Secondary | ICD-10-CM | POA: Diagnosis not present

## 2023-02-02 DIAGNOSIS — R630 Anorexia: Secondary | ICD-10-CM | POA: Diagnosis not present

## 2023-02-02 DIAGNOSIS — G309 Alzheimer's disease, unspecified: Secondary | ICD-10-CM | POA: Diagnosis not present

## 2023-02-02 DIAGNOSIS — U071 COVID-19: Secondary | ICD-10-CM | POA: Diagnosis not present

## 2023-02-02 DIAGNOSIS — I5032 Chronic diastolic (congestive) heart failure: Secondary | ICD-10-CM | POA: Diagnosis not present

## 2023-02-02 DIAGNOSIS — N1832 Chronic kidney disease, stage 3b: Secondary | ICD-10-CM | POA: Diagnosis not present

## 2023-02-02 DIAGNOSIS — Z9189 Other specified personal risk factors, not elsewhere classified: Secondary | ICD-10-CM | POA: Diagnosis not present

## 2023-02-02 DIAGNOSIS — F0280 Dementia in other diseases classified elsewhere without behavioral disturbance: Secondary | ICD-10-CM | POA: Diagnosis not present

## 2023-02-03 DIAGNOSIS — R52 Pain, unspecified: Secondary | ICD-10-CM | POA: Diagnosis not present

## 2023-02-03 DIAGNOSIS — R1310 Dysphagia, unspecified: Secondary | ICD-10-CM | POA: Diagnosis not present

## 2023-02-03 DIAGNOSIS — G9341 Metabolic encephalopathy: Secondary | ICD-10-CM | POA: Diagnosis not present

## 2023-02-03 DIAGNOSIS — F32A Depression, unspecified: Secondary | ICD-10-CM | POA: Diagnosis not present

## 2023-02-03 DIAGNOSIS — M1990 Unspecified osteoarthritis, unspecified site: Secondary | ICD-10-CM | POA: Diagnosis not present

## 2023-02-03 DIAGNOSIS — Z87440 Personal history of urinary (tract) infections: Secondary | ICD-10-CM | POA: Diagnosis not present

## 2023-02-03 DIAGNOSIS — K5900 Constipation, unspecified: Secondary | ICD-10-CM | POA: Diagnosis not present

## 2023-02-03 DIAGNOSIS — Z9181 History of falling: Secondary | ICD-10-CM | POA: Diagnosis not present

## 2023-02-04 DIAGNOSIS — N390 Urinary tract infection, site not specified: Secondary | ICD-10-CM | POA: Diagnosis not present

## 2023-02-06 DIAGNOSIS — R001 Bradycardia, unspecified: Secondary | ICD-10-CM | POA: Diagnosis not present

## 2023-02-06 DIAGNOSIS — R55 Syncope and collapse: Secondary | ICD-10-CM | POA: Diagnosis not present

## 2023-02-06 DIAGNOSIS — R404 Transient alteration of awareness: Secondary | ICD-10-CM | POA: Diagnosis not present

## 2023-02-06 DIAGNOSIS — I499 Cardiac arrhythmia, unspecified: Secondary | ICD-10-CM | POA: Diagnosis not present

## 2023-02-09 NOTE — Addendum Note (Signed)
Addended by: Elease Etienne A on: 02/09/2023 10:34 AM   Modules accepted: Level of Service

## 2023-02-09 NOTE — Progress Notes (Signed)
Remote pacemaker transmission.   

## 2023-02-10 ENCOUNTER — Ambulatory Visit: Payer: Medicare Other | Admitting: Internal Medicine

## 2023-02-12 DIAGNOSIS — 419620001 Death: Secondary | SNOMED CT | POA: Diagnosis not present

## 2023-02-12 DEATH — deceased

## 2023-03-14 DEATH — deceased

## 2023-04-05 ENCOUNTER — Telehealth: Payer: Self-pay

## 2023-04-05 NOTE — Telephone Encounter (Signed)
Pt passed away. I ordered a return kit. Her daughter should receive in 7-10 business days. I took her out of [facility].
